# Patient Record
Sex: Female | Born: 1935 | Race: White | Hispanic: No | Smoking: Former smoker
Health system: Southern US, Community
[De-identification: ages and names within clinical notes are randomized; demographics above are authoritative.]

## PROBLEM LIST (undated history)

## (undated) DIAGNOSIS — C801 Malignant (primary) neoplasm, unspecified: Secondary | ICD-10-CM

## (undated) DIAGNOSIS — R7989 Other specified abnormal findings of blood chemistry: Secondary | ICD-10-CM

## (undated) DIAGNOSIS — I10 Essential (primary) hypertension: Secondary | ICD-10-CM

## (undated) DIAGNOSIS — E785 Hyperlipidemia, unspecified: Secondary | ICD-10-CM

## (undated) DIAGNOSIS — M899 Disorder of bone, unspecified: Secondary | ICD-10-CM

## (undated) DIAGNOSIS — M810 Age-related osteoporosis without current pathological fracture: Secondary | ICD-10-CM

## (undated) DIAGNOSIS — L570 Actinic keratosis: Secondary | ICD-10-CM

## (undated) DIAGNOSIS — J9801 Acute bronchospasm: Secondary | ICD-10-CM

## (undated) DIAGNOSIS — H269 Unspecified cataract: Secondary | ICD-10-CM

## (undated) DIAGNOSIS — R0602 Shortness of breath: Secondary | ICD-10-CM

## (undated) DIAGNOSIS — N809 Endometriosis, unspecified: Secondary | ICD-10-CM

## (undated) DIAGNOSIS — R002 Palpitations: Secondary | ICD-10-CM

## (undated) DIAGNOSIS — I491 Atrial premature depolarization: Secondary | ICD-10-CM

## (undated) DIAGNOSIS — N939 Abnormal uterine and vaginal bleeding, unspecified: Secondary | ICD-10-CM

## (undated) DIAGNOSIS — J45909 Unspecified asthma, uncomplicated: Secondary | ICD-10-CM

## (undated) DIAGNOSIS — M1990 Unspecified osteoarthritis, unspecified site: Secondary | ICD-10-CM

## (undated) DIAGNOSIS — M949 Disorder of cartilage, unspecified: Secondary | ICD-10-CM

## (undated) DIAGNOSIS — R609 Edema, unspecified: Secondary | ICD-10-CM

## (undated) DIAGNOSIS — N946 Dysmenorrhea, unspecified: Secondary | ICD-10-CM

## (undated) DIAGNOSIS — R42 Dizziness and giddiness: Secondary | ICD-10-CM

## (undated) DIAGNOSIS — N950 Postmenopausal bleeding: Secondary | ICD-10-CM

## (undated) HISTORY — DX: Endometriosis, unspecified: N80.9

## (undated) HISTORY — DX: Unspecified asthma, uncomplicated: J45.909

## (undated) HISTORY — DX: Dizziness and giddiness: R42

## (undated) HISTORY — DX: Shortness of breath: R06.02

## (undated) HISTORY — DX: Atrial premature depolarization: I49.1

## (undated) HISTORY — DX: Hyperlipidemia, unspecified: E78.5

## (undated) HISTORY — DX: Other specified abnormal findings of blood chemistry: R79.89

## (undated) HISTORY — DX: Age-related osteoporosis without current pathological fracture: M81.0

## (undated) HISTORY — DX: Disorder of bone, unspecified: M89.9

## (undated) HISTORY — DX: Actinic keratosis: L57.0

## (undated) HISTORY — DX: Edema, unspecified: R60.9

## (undated) HISTORY — DX: Disorder of cartilage, unspecified: M94.9

## (undated) HISTORY — DX: Essential (primary) hypertension: I10

## (undated) HISTORY — DX: Unspecified osteoarthritis, unspecified site: M19.90

## (undated) HISTORY — DX: Unspecified cataract: H26.9

## (undated) HISTORY — DX: Palpitations: R00.2

## (undated) HISTORY — DX: Postmenopausal bleeding: N95.0

## (undated) HISTORY — DX: Acute bronchospasm: J98.01

## (undated) HISTORY — DX: Dysmenorrhea, unspecified: N94.6

## (undated) HISTORY — DX: Abnormal uterine and vaginal bleeding, unspecified: N93.9

## (undated) HISTORY — PX: DILATION AND CURETTAGE OF UTERUS: SHX78

## (undated) HISTORY — DX: Malignant (primary) neoplasm, unspecified: C80.1

---
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
## 2000-04-16 ENCOUNTER — Encounter: Payer: Self-pay | Admitting: Family Medicine

## 2000-04-16 ENCOUNTER — Encounter: Admission: RE | Admit: 2000-04-16 | Discharge: 2000-04-16 | Payer: Self-pay | Admitting: Family Medicine

## 2002-08-21 HISTORY — PX: CARPAL TUNNEL RELEASE: SHX101

## 2005-07-26 ENCOUNTER — Other Ambulatory Visit: Admission: RE | Admit: 2005-07-26 | Discharge: 2005-07-26 | Payer: Self-pay | Admitting: Family Medicine

## 2005-08-23 ENCOUNTER — Encounter: Admission: RE | Admit: 2005-08-23 | Discharge: 2005-08-23 | Payer: Self-pay | Admitting: Family Medicine

## 2006-08-20 DIAGNOSIS — I10 Essential (primary) hypertension: Secondary | ICD-10-CM | POA: Insufficient documentation

## 2006-08-20 DIAGNOSIS — M1990 Unspecified osteoarthritis, unspecified site: Secondary | ICD-10-CM

## 2006-08-20 HISTORY — DX: Essential (primary) hypertension: I10

## 2006-08-20 HISTORY — DX: Unspecified osteoarthritis, unspecified site: M19.90

## 2006-08-21 HISTORY — PX: KNEE ARTHROSCOPY: SHX127

## 2007-02-18 DIAGNOSIS — M899 Disorder of bone, unspecified: Secondary | ICD-10-CM

## 2007-02-18 HISTORY — DX: Disorder of bone, unspecified: M89.9

## 2008-04-21 DIAGNOSIS — C801 Malignant (primary) neoplasm, unspecified: Secondary | ICD-10-CM

## 2008-04-21 HISTORY — DX: Malignant (primary) neoplasm, unspecified: C80.1

## 2008-08-21 HISTORY — PX: CATARACT EXTRACTION W/ INTRAOCULAR LENS IMPLANT: SHX1309

## 2008-12-01 ENCOUNTER — Encounter: Admission: RE | Admit: 2008-12-01 | Discharge: 2008-12-01 | Payer: Self-pay | Admitting: Internal Medicine

## 2009-07-21 HISTORY — PX: EYE SURGERY: SHX253

## 2010-08-30 DIAGNOSIS — N950 Postmenopausal bleeding: Secondary | ICD-10-CM

## 2010-08-30 DIAGNOSIS — R42 Dizziness and giddiness: Secondary | ICD-10-CM

## 2010-08-30 HISTORY — DX: Dizziness and giddiness: R42

## 2010-08-30 HISTORY — DX: Postmenopausal bleeding: N95.0

## 2010-10-18 DIAGNOSIS — E785 Hyperlipidemia, unspecified: Secondary | ICD-10-CM

## 2010-10-18 HISTORY — DX: Hyperlipidemia, unspecified: E78.5

## 2010-11-03 ENCOUNTER — Ambulatory Visit (HOSPITAL_BASED_OUTPATIENT_CLINIC_OR_DEPARTMENT_OTHER)
Admission: RE | Admit: 2010-11-03 | Discharge: 2010-11-03 | Disposition: A | Discharge status: Home / Self Care | Payer: MEDICARE | Source: Ambulatory Visit | Attending: Gynecology | Admitting: Gynecology

## 2010-11-03 ENCOUNTER — Ambulatory Visit (HOSPITAL_COMMUNITY)
Admission: RE | Admit: 2010-11-03 | Discharge: 2010-11-03 | Disposition: A | Discharge status: Home / Self Care | Payer: MEDICARE | Source: Ambulatory Visit | Attending: Gynecology | Admitting: Gynecology

## 2010-11-03 ENCOUNTER — Other Ambulatory Visit: Payer: Self-pay | Admitting: Gynecology

## 2010-11-03 DIAGNOSIS — N840 Polyp of corpus uteri: Secondary | ICD-10-CM | POA: Insufficient documentation

## 2010-11-03 DIAGNOSIS — N952 Postmenopausal atrophic vaginitis: Secondary | ICD-10-CM | POA: Insufficient documentation

## 2010-11-03 DIAGNOSIS — Z01810 Encounter for preprocedural cardiovascular examination: Secondary | ICD-10-CM | POA: Insufficient documentation

## 2010-11-03 DIAGNOSIS — Z01812 Encounter for preprocedural laboratory examination: Secondary | ICD-10-CM | POA: Insufficient documentation

## 2010-11-03 DIAGNOSIS — M47814 Spondylosis without myelopathy or radiculopathy, thoracic region: Secondary | ICD-10-CM | POA: Insufficient documentation

## 2010-11-03 DIAGNOSIS — Z01818 Encounter for other preprocedural examination: Secondary | ICD-10-CM | POA: Insufficient documentation

## 2010-11-03 DIAGNOSIS — N950 Postmenopausal bleeding: Secondary | ICD-10-CM | POA: Insufficient documentation

## 2010-11-03 LAB — POCT I-STAT 4, (NA,K, GLUC, HGB,HCT)
Glucose, Bld: 105 mg/dL — ABNORMAL HIGH (ref 70–99)
HCT: 41 % (ref 36.0–46.0)
Hemoglobin: 13.9 g/dL (ref 12.0–15.0)
Potassium: 3.5 mEq/L (ref 3.5–5.1)
Sodium: 138 mEq/L (ref 135–145)

## 2011-02-03 NOTE — Op Note (Signed)
  NAMESHANTA, HARTNER NO.:  [id number]  MEDICAL RECORD NO.:  [id number]           PATIENT TYPE:  O  LOCATION:  XRAY                         FACILITY:  [facility]  PHYSICIAN:  Gretta Cool, M.D. DATE OF BIRTH:  23-Feb-1923  DATE OF PROCEDURE:  11/03/2010 DATE OF DISCHARGE:                              OPERATIVE REPORT   PREOPERATIVE DIAGNOSES: 1. Postmenopausal bleeding with endometrial polyp by ultrasound,     highly suspicious of endometrial cancer. 2. Extreme vaginal and genital atrophy with very difficult access.  POSTOPERATIVE DIAGNOSES: 1. Postmenopausal bleeding with endometrial polyp by ultrasound,     highly suspicious of endometrial cancer. 2. Extreme vaginal and genital atrophy with very difficult access.  PROCEDURES: 1. Paracervical block. 2. Hysteroscopy. 3. Resection of endometrial polyp and irregular endometrial surface.  SURGEON:  Gretta Cool, M.D.  ANESTHESIA:  IV sedation and paracervical block.  BRIEF HISTORY:  This is an 77 year old G3 P3 with sudden onset of postmenopausal bleeding.  Evaluation revealed vaginal atrophy and on ultrasound fluid-filled cavity with a polyp that prominently bulged into the cavity.  We have discussed that in this setting, the risk of malignancy exceed 60%.  I have recommended on to hysteroscopy under IV sedation adequate to allow access to her cervix that cannot be visualized in the office.  She is now admitted for hysteroscopy, resection of the endometrial polyp to rule out endometrial cancer.  DESCRIPTION OF PROCEDURE:  Under excellent anesthesia as above with the patient prepped and draped in Allen stirrups with her bladder drained, a Peterson speculum was placed in the vagina for application of paracervical block and for access to the very elusive posterior lip of the cervix.  With traction from the anterior lip, the posterior lip could finally be accessed and the cervix and uterus straightened.   The cavity was then sounded with os locators and then small [employer] dilators. A series of Pratt dilators were then used to dilate the cervix sufficiently to allow the hysteroscopic resectoscope to be placed.  The resectoscope was then placed and the cavity photographed.  The polyp seen by ultrasound was easily identified and resected.  Irregular endometrial tissue in several areas around the cavity of the uterus were identified and also resected and submitted for pathologic exam.  At the end of the procedure, there remained no significant endometrial abnormality visible.  There were no complications, no perforation.  Estimated fluid deficit was approximately 75 cc.  Complications none.          ______________________________ Gretta Cool, M.D.     CWL/MEDQ  D:  11/03/2010  T:  11/03/2010  Job:  [id number]  cc:   Lenon Curt. Chilton Si, M.D. Fax: [phone]  Gretta Cool, M.D. Fax: [phone]  Electronically Signed by Beather Arbour M.D. on 02/03/2011 11:49:37 AM

## 2011-03-14 DIAGNOSIS — R7989 Other specified abnormal findings of blood chemistry: Secondary | ICD-10-CM

## 2011-03-14 DIAGNOSIS — R739 Hyperglycemia, unspecified: Secondary | ICD-10-CM | POA: Insufficient documentation

## 2011-03-14 HISTORY — DX: Other specified abnormal findings of blood chemistry: R79.89

## 2011-05-23 DIAGNOSIS — I491 Atrial premature depolarization: Secondary | ICD-10-CM

## 2011-05-23 HISTORY — DX: Atrial premature depolarization: I49.1

## 2011-08-02 ENCOUNTER — Other Ambulatory Visit: Payer: Self-pay | Admitting: Dermatology

## 2011-10-31 DIAGNOSIS — J9801 Acute bronchospasm: Secondary | ICD-10-CM

## 2011-10-31 HISTORY — DX: Acute bronchospasm: J98.01

## 2011-11-28 DIAGNOSIS — R0602 Shortness of breath: Secondary | ICD-10-CM

## 2011-11-28 HISTORY — DX: Shortness of breath: R06.02

## 2011-12-19 DIAGNOSIS — R609 Edema, unspecified: Secondary | ICD-10-CM | POA: Insufficient documentation

## 2011-12-19 HISTORY — DX: Edema, unspecified: R60.9

## 2012-02-08 ENCOUNTER — Other Ambulatory Visit: Payer: Self-pay

## 2012-02-13 DIAGNOSIS — L570 Actinic keratosis: Secondary | ICD-10-CM

## 2012-02-13 DIAGNOSIS — R002 Palpitations: Secondary | ICD-10-CM

## 2012-02-13 HISTORY — DX: Palpitations: R00.2

## 2012-02-13 HISTORY — DX: Actinic keratosis: L57.0

## 2012-08-05 ENCOUNTER — Other Ambulatory Visit: Payer: Self-pay

## 2012-09-12 ENCOUNTER — Other Ambulatory Visit: Payer: Self-pay

## 2012-10-19 HISTORY — PX: CATARACT EXTRACTION W/ INTRAOCULAR LENS IMPLANT: SHX1309

## 2013-02-03 ENCOUNTER — Other Ambulatory Visit: Payer: Self-pay | Admitting: Geriatric Medicine

## 2013-02-04 ENCOUNTER — Encounter: Payer: Self-pay | Admitting: *Deleted

## 2013-02-11 ENCOUNTER — Encounter: Payer: Self-pay | Admitting: Internal Medicine

## 2013-02-11 ENCOUNTER — Non-Acute Institutional Stay: Payer: Medicare Other | Admitting: Internal Medicine

## 2013-02-11 VITALS — BP 130/68 | HR 62 | Temp 97.1°F | Ht 62.5 in | Wt 123.0 lb

## 2013-02-11 DIAGNOSIS — R609 Edema, unspecified: Secondary | ICD-10-CM

## 2013-02-11 DIAGNOSIS — M1990 Unspecified osteoarthritis, unspecified site: Secondary | ICD-10-CM

## 2013-02-11 DIAGNOSIS — I10 Essential (primary) hypertension: Secondary | ICD-10-CM

## 2013-02-11 DIAGNOSIS — R7989 Other specified abnormal findings of blood chemistry: Secondary | ICD-10-CM

## 2013-02-11 DIAGNOSIS — M899 Disorder of bone, unspecified: Secondary | ICD-10-CM

## 2013-02-11 DIAGNOSIS — R002 Palpitations: Secondary | ICD-10-CM

## 2013-02-11 DIAGNOSIS — E785 Hyperlipidemia, unspecified: Secondary | ICD-10-CM

## 2013-02-11 DIAGNOSIS — L570 Actinic keratosis: Secondary | ICD-10-CM

## 2013-02-11 NOTE — Progress Notes (Signed)
Passed clock drawing 

## 2013-03-25 ENCOUNTER — Other Ambulatory Visit: Payer: Self-pay

## 2013-03-29 ENCOUNTER — Other Ambulatory Visit: Payer: Self-pay | Admitting: Internal Medicine

## 2013-04-17 ENCOUNTER — Encounter: Payer: Self-pay | Admitting: Internal Medicine

## 2013-04-17 DIAGNOSIS — L570 Actinic keratosis: Secondary | ICD-10-CM | POA: Insufficient documentation

## 2013-04-17 NOTE — Progress Notes (Signed)
Subjective:    Patient ID: Terri Acosta, female    DOB: 03/19/1923, 79 y.o.   MRN: [id number]  HPI Palpitations: Patient reports occasional episodes of palpitations. They do not interfere with her life and are not accompanied by dizziness, chest discomfort, or shortness of breath.  Edema: Trace amount of lower legs.  Other abnormal blood chemistry: History of elevated glucose. No medications. Diet controlled. Last glucose 104.  Other and unspecified hyperlipidemia: Controlled  Unspecified essential hypertension: Controlled  Osteoarthrosis, unspecified whether generalized or localized, unspecified site: Generalized  Disorder of bone and cartilage, unspecified: Osteopenia    Current Outpatient Prescriptions on File Prior to Visit  Medication Sig Dispense Refill  . albuterol (PROVENTIL HFA;VENTOLIN HFA) 108 (90 BASE) MCG/ACT inhaler Inhale 2 puffs into the lungs every 6 (six) hours as needed for wheezing. Take 2 puffs prior to exertion up to four times daily to help breathing.      [name] amLODipine (NORVASC) 5 MG tablet Take 5 mg by mouth daily. Take 1/2 tablet twice daily for blood pressure.      [name] aspirin 81 MG tablet Take 81 mg by mouth daily. Take 1 tablet daily for heart attack and stroke.      . Calcium Carb-Cholecalciferol (CALCIUM + D3) 600-200 MG-UNIT TABS Take 600 mg by mouth. Take 2 tablets daily as Vit. D supplement.      [name] dextromethorphan (DELSYM) 30 MG/5ML liquid Take 60 mg by mouth as needed for cough. Take 1 tsp every 12 hours as needed for cough.      . metoprolol (LOPRESSOR) 50 MG tablet Take 50 mg by mouth 2 (two) times daily. Take a tablet twice daily to control heart rhythm and blood pressure.       No current facility-administered medications on file prior to visit.   Immunization History  Administered Date(s) Administered  . Influenza Whole 08/21/2010, 05/21/2012  . Pneumococcal Conjugate 08/22/1999  . Td 08/22/1999  . Zoster 08/21/2005   Active Ambulatory  Problems    Diagnosis Date Noted  . Palpitations 02/13/2012  . Edema 12/19/2011  . Other abnormal blood chemistry 03/14/2011  . Other and unspecified hyperlipidemia 10/18/2010  . Unspecified essential hypertension 08/20/2006  . Osteoarthrosis, unspecified whether generalized or localized, unspecified site 08/20/2006  . Disorder of bone and cartilage, unspecified 02/18/2007   Resolved Ambulatory Problems    Diagnosis Date Noted  . No Resolved Ambulatory Problems   Past Medical History  Diagnosis Date  . Actinic keratosis 02/13/2012  . Shortness of breath 11/28/2011  . Acute bronchospasm 10/31/2011  . Supraventricular premature beats 05/23/2011  . 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 Dizziness and giddiness 08/30/2010  . Postmenopausal bleeding 08/30/2010  . Cataract   . Cancer 04/2008   Past Surgical History  Procedure Laterality Date  . Cesarean section  [id number]    x3  . Carpal tunnel release  2004    S. Norris MD  . Knee arthroscopy Right 2008    Torn meniscus  . Eye surgery Right 07/2009    cataract extraction/IOLI McCuen, MD  . Cataract extraction w/ intraocular lens implant Right 2010  . Cataract extraction w/ intraocular lens implant Left 10/2012   History   Social History  . Marital Status: Widowed    Spouse Name: N/A    Number of Children: N/A  . Years of Education: N/A   Social History Main Topics  . Smoking status: Former Smoker    Quit date: 02/11/1978  . Smokeless tobacco: Never Used  .  Alcohol Use: 0.6 oz/week    1 Glasses of wine per week     Comment: nightly  . Drug Use: No  . Sexual Activity: No   Other Topics Concern  . None   Social History Narrative   Lives alone in a apartment at [facility] in the independent living area.   The patient is not exercising regularly.   No specific diet.   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   She has a living will.    Family Status  Relation Status Death Age  . Daughter  Alive   . Mother Deceased 29    natural causes  . Father Deceased 38    natural causes  . Sister Alive   . Brother Deceased 38    COPD  . Son Alive   . Sister Alive     History of syringamyelia  . Son Alive    Family History  Problem Relation Age of Onset  . Diabetes Brother   . COPD Brother    CONSULTANTS Orthopedic: Malon Kindle Ophthalmology: Rueben Bash Dermatology: Lovenia Kim Cardiology: Jacinto Halim  PAST PROCEDURES 08/22/2006 bone density: Osteopenia 12/01/08 chest x-ray: Borderline to slight hyperinflation of COPD.  07/27/2009 right knee x-ray: Mild to moderate osteoarthritic change. Some evidence for chondromalacia versus possible degenerative meniscus tear. 08/07/2011 renal artery ultrasound: No evidence for renal artery occlusive disease. Normal kidney size. 1.3 cm anechoic cyst seen in left kidney.  Review of Systems  Constitutional: Negative for fever, diaphoresis, activity change, appetite change, fatigue and unexpected weight change.  HENT: Positive for hearing loss. Negative for ear pain.   Eyes: Positive for visual disturbance.       Diminished visual acuity in the left eye due to cataract.  Respiratory: Negative.   Cardiovascular: Negative for chest pain, palpitations and leg swelling.  Gastrointestinal: Negative.        Mild, intermittent reflux symptoms.  Endocrine: Negative.   Genitourinary: Negative.   Musculoskeletal:       Chronic generalized mild arthritis. She has right knee discomfort. There is no joint swelling. She denies loss of balance. Gait is stable.  Skin:       Complains of dry skin. Actinic keratoses present on right ear and right upper cheek. Sees Dr. Lovenia Kim.  Neurological: Negative.   Hematological: Negative.   Psychiatric/Behavioral: Negative.        Objective:BP 130/68  Pulse 62  Temp(Src) 97.1 F (36.2 C) (Oral)  Ht 5' 2.5" (1.588 m)  Wt 123 lb (55.792 kg)  BMI 22.12 kg/m2    Physical Exam  Constitutional: She is  oriented to person, place, and time. She appears well-developed and well-nourished. No distress.  Thin body habitus.  Eyes:  Cataract left eye. Right lens implant. Left lower eyelid eversion.  Cardiovascular: Normal rate, regular rhythm and intact distal pulses.  Exam reveals no gallop and no friction rub.   Grade 1/6 systolic ejection murmur heard best along the sternal border.  Pulmonary/Chest: Breath sounds normal. No respiratory distress. She has no wheezes. She has no rales. She exhibits no tenderness.  Abdominal: Bowel sounds are normal. She exhibits no distension and no mass. There is no tenderness.  Genitourinary:  Patient refused genital and rectal exams.  Musculoskeletal: She exhibits no edema and no tenderness.  Neurological: She is alert and oriented to person, place, and time. She has normal reflexes. No cranial nerve deficit. Coordination normal.  Intact vibratory sensation. 02/11/2013 MMSE 28/30. Passed clock drawing.  Skin: No rash  noted. No erythema. No pallor.  Actinic keratosis right ear and right upper cheek.  Psychiatric: She has a normal mood and affect. Her behavior is normal. Judgment and thought content normal.     LAB REVIEW 02/03/2013 CMP: Glucose 104, otherwise normal  Lipids: TC 203, trig 67, HDL 72, LDL 118     Assessment & Plan:  Palpitations: No active intervention felt necessary at this time.  Edema: Stable  Other abnormal blood chemistry: Previous elevations of glucose. Currently normal  Other and unspecified hyperlipidemia: His never been treated by medication. She has relatively high HDL which offset some of the problems of her slightly elevated LDL.  Unspecified essential hypertension: Controlled  Osteoarthrosis, unspecified whether generalized or localized, unspecified site: Chronic condition which is not changing  Disorder of bone and cartilage, unspecified: Osteopenia. No active treatment at this time.  Actinic keratosis of right cheek:  Patient will be seeing Dr. Lovenia Kim, dermatologist.

## 2013-04-17 NOTE — Patient Instructions (Signed)
Continue current medications. 

## 2013-07-01 ENCOUNTER — Other Ambulatory Visit: Payer: Self-pay | Admitting: Internal Medicine

## 2013-08-12 ENCOUNTER — Encounter: Payer: Self-pay | Admitting: Internal Medicine

## 2013-08-12 ENCOUNTER — Non-Acute Institutional Stay: Payer: Medicare Other | Admitting: Internal Medicine

## 2013-08-12 VITALS — BP 122/72 | HR 66 | Temp 97.1°F | Resp 16 | Wt 125.5 lb

## 2013-08-12 DIAGNOSIS — R609 Edema, unspecified: Secondary | ICD-10-CM

## 2013-08-12 DIAGNOSIS — R002 Palpitations: Secondary | ICD-10-CM

## 2013-08-12 DIAGNOSIS — M1990 Unspecified osteoarthritis, unspecified site: Secondary | ICD-10-CM

## 2013-08-12 DIAGNOSIS — I10 Essential (primary) hypertension: Secondary | ICD-10-CM

## 2013-08-12 NOTE — Patient Instructions (Signed)
Continue current medications. 

## 2013-08-12 NOTE — Progress Notes (Signed)
Patient ID: Terri Acosta, female   DOB: 01/26/1923, 79 y.o.   MRN: [id number]    Location:  [facility]   Place of Service: Clinic (12)    Allergies  Allergen Reactions  . Sulfa Antibiotics     Chief Complaint  Patient presents with  . Follow-up    BP, Hyperglycemia, Papitations    HPI:  Some palpitations at night, Does not notice in the day.  Pain in the right elbow. Getting stiff. Left hip pain. Thumb pain.Pain in the right knee. Gets cortisone injections from orthopedist, Dr. Ranell Patrick. Finding it more difficult to  Walk. Still driving. Requests Handicap Permit.    Medications: Patient's Medications  New Prescriptions   No medications on file  Previous Medications   ALBUTEROL (PROVENTIL HFA;VENTOLIN HFA) 108 (90 BASE) MCG/ACT INHALER    Inhale 2 puffs into the lungs every 6 (six) hours as needed for wheezing. Take 2 puffs prior to exertion up to four times daily to help breathing.   AMLODIPINE (NORVASC) 5 MG TABLET    Take 5 mg by mouth daily. Take 1/2 tablet twice daily for blood pressure.   ASPIRIN 81 MG TABLET    Take 81 mg by mouth daily. Take 1 tablet daily for heart attack and stroke.   CALCIUM CARB-CHOLECALCIFEROL (CALCIUM + D3) 600-200 MG-UNIT TABS    Take 600 mg by mouth. Take 2 tablets daily as Vit. D supplement.   DEXTROMETHORPHAN (DELSYM) 30 MG/5ML LIQUID    Take 60 mg by mouth as needed for cough. Take 1 tsp every 12 hours as needed for cough.   LOSARTAN-HYDROCHLOROTHIAZIDE (HYZAAR) 50-12.5 MG PER TABLET    TAKE 1 TABLET DAILY TO CONTROL BLOOD PRESSURE   METOPROLOL (LOPRESSOR) 50 MG TABLET    Take 50 mg by mouth 2 (two) times daily. Take a tablet twice daily to control heart rhythm and blood pressure.  Modified Medications   No medications on file  Discontinued Medications   No medications on file     Review of Systems  Constitutional: Negative for fever, diaphoresis, activity change, appetite change, fatigue and unexpected weight change.  HENT:  Positive for hearing loss. Negative for ear pain.   Eyes: Positive for visual disturbance.       Diminished visual acuity in the left eye due to cataract.  Respiratory: Negative.   Cardiovascular: Negative for chest pain, palpitations and leg swelling.  Gastrointestinal: Negative.        Mild, intermittent reflux symptoms.  Endocrine: Negative.   Genitourinary: Negative.   Musculoskeletal:       Chronic generalized mild arthritis. She has right knee discomfort. There is no joint swelling. She denies loss of balance. Gait is stable.  Skin:       Complains of dry skin. Actinic keratoses present on right ear and right upper cheek. Sees Dr. Lovenia Kim.  Neurological: Negative.   Hematological: Negative.   Psychiatric/Behavioral: Negative.     Filed Vitals:   08/12/13 0909  BP: 122/72  Pulse: 66  Temp: 97.1 F (36.2 C)  TempSrc: Oral  Resp: 16  Weight: 125 lb 8 oz (56.926 kg)   Physical Exam  Constitutional: She is oriented to person, place, and time. She appears well-developed and well-nourished. No distress.  Thin body habitus.  Eyes:  Cataract left eye. Right lens implant. Left lower eyelid eversion.  Cardiovascular: Normal rate, regular rhythm and intact distal pulses.  Exam reveals no gallop and no friction rub.   Grade 1/6 systolic ejection  murmur heard best along the sternal border.  Pulmonary/Chest: Breath sounds normal. No respiratory distress. She has no wheezes. She has no rales. She exhibits no tenderness.  Abdominal: Bowel sounds are normal. She exhibits no distension and no mass. There is no tenderness.  Musculoskeletal: She exhibits tenderness. She exhibits no edema.  Tender in the right knee, wrists, and hands.  Neurological: She is alert and oriented to person, place, and time. She has normal reflexes. No cranial nerve deficit. Coordination normal.  Intact vibratory sensation. 02/11/2013 MMSE 28/30. Passed clock drawing.  Skin: No rash noted. No erythema. No  pallor.  Actinic keratosis right ear and right upper cheek.  Psychiatric: She has a normal mood and affect. Her behavior is normal. Judgment and thought content normal.     Labs reviewed: No visits with results within 3 Month(s) from this visit. Latest known visit with results is:  Hospital Outpatient Visit on 11/03/2010  Component Date Value Range Status  . Sodium 11/03/2010 138  135 - 145 mEq/L Final  . Potassium 11/03/2010 3.5  3.5 - 5.1 mEq/L Final  . Glucose, Bld 11/03/2010 105* 70 - 99 mg/dL Final  . HCT 09/81/1914 41.0  36.0 - 46.0 % Final  . Hemoglobin 11/03/2010 13.9  12.0 - 15.0 g/dL Final      Assessment/Plan  Unspecified essential hypertension: controlled  Osteoarthrosis, unspecified whether generalized or localized, unspecified site: completed Handicap Permit.  Palpitations: unchanged  Edema:resolved

## 2014-01-20 ENCOUNTER — Other Ambulatory Visit: Payer: Self-pay | Admitting: Internal Medicine

## 2014-01-20 MED ORDER — AMLODIPINE BESYLATE 5 MG PO TABS: ORAL_TABLET | ORAL | Status: DC

## 2014-02-02 LAB — BASIC METABOLIC PANEL
BUN: 13 mg/dL (ref 4–21)
Creatinine: 0.6 mg/dL (ref 0.5–1.1)
GLUCOSE: 96 mg/dL
Potassium: 3.7 mmol/L (ref 3.4–5.3)
Sodium: 136 mmol/L — AB (ref 137–147)

## 2014-02-02 LAB — LIPID PANEL
Cholesterol: 197 mg/dL (ref 0–200)
HDL: 71 mg/dL — AB (ref 35–70)
LDL CALC: 110 mg/dL
Triglycerides: 82 mg/dL (ref 40–160)

## 2014-02-02 LAB — HEPATIC FUNCTION PANEL
ALK PHOS: 48 U/L (ref 25–125)
ALT: 8 U/L (ref 7–35)
AST: 14 U/L (ref 13–35)
BILIRUBIN, TOTAL: 0.6 mg/dL

## 2014-02-10 ENCOUNTER — Encounter: Payer: Self-pay | Admitting: Internal Medicine

## 2014-02-17 ENCOUNTER — Non-Acute Institutional Stay: Payer: Medicare Other | Admitting: Internal Medicine

## 2014-02-17 ENCOUNTER — Encounter: Payer: Self-pay | Admitting: Internal Medicine

## 2014-02-17 VITALS — BP 152/78 | HR 60 | Wt 124.0 lb

## 2014-02-17 DIAGNOSIS — R609 Edema, unspecified: Secondary | ICD-10-CM

## 2014-02-17 DIAGNOSIS — E785 Hyperlipidemia, unspecified: Secondary | ICD-10-CM

## 2014-02-17 DIAGNOSIS — I10 Essential (primary) hypertension: Secondary | ICD-10-CM

## 2014-02-17 DIAGNOSIS — R002 Palpitations: Secondary | ICD-10-CM

## 2014-02-17 DIAGNOSIS — R7309 Other abnormal glucose: Secondary | ICD-10-CM

## 2014-02-17 DIAGNOSIS — R739 Hyperglycemia, unspecified: Secondary | ICD-10-CM

## 2014-02-17 NOTE — Progress Notes (Signed)
Patient ID: Terri Acosta, female   DOB: August 05, 1923, 80 y.o.   MRN: [id number]    Location:  [facility]   Place of Service: Clinic (12)    Allergies  Allergen Reactions  . Sulfa Antibiotics     Chief Complaint  Patient presents with  . Medical Management of Chronic Issues    blood pressure, palpitations, cholesterol, hyperglycemia    HPI:  Unspecified essential hypertension: controlled  Hyperglycemia: normal on last test  Palpitations: syill resent. No chest pain.  Edema: improved  Other and unspecified hyperlipidemia: controlled    Medications: Patient's Medications  New Prescriptions   No medications on file  Previous Medications   ALBUTEROL (PROVENTIL HFA;VENTOLIN HFA) 108 (90 BASE) MCG/ACT INHALER    Inhale 2 puffs into the lungs every 6 (six) hours as needed for wheezing. Take 2 puffs prior to exertion up to four times daily to help breathing.   AMLODIPINE (NORVASC) 5 MG TABLET    Take 1/2 tablet twice daily for blood pressure.   ASPIRIN 81 MG TABLET    Take 81 mg by mouth daily. Take 1 tablet daily for heart attack and stroke.   CALCIUM CARB-CHOLECALCIFEROL (CALCIUM + D3) 600-200 MG-UNIT TABS    Take 600 mg by mouth. Take 2 tablets daily as Vit. D supplement.   DEXTROMETHORPHAN (DELSYM) 30 MG/5ML LIQUID    Take 60 mg by mouth as needed for cough. Take 1 tsp every 12 hours as needed for cough.   LOSARTAN-HYDROCHLOROTHIAZIDE (HYZAAR) 50-12.5 MG PER TABLET    TAKE 1 TABLET DAILY TO CONTROL BLOOD PRESSURE   METOPROLOL (LOPRESSOR) 50 MG TABLET    Take 50 mg by mouth 2 (two) times daily. Take a tablet twice daily to control heart rhythm and blood pressure.  Modified Medications   No medications on file  Discontinued Medications   No medications on file     Review of Systems  Constitutional: Negative for fever, diaphoresis, activity change, appetite change, fatigue and unexpected weight change.  HENT: Positive for hearing loss. Negative for ear pain.     Eyes: Positive for visual disturbance.       Diminished visual acuity in the left eye due to cataract.  Respiratory: Negative.   Cardiovascular: Negative for chest pain, palpitations and leg swelling.  Gastrointestinal: Negative.        Mild, intermittent reflux symptoms.  Endocrine: Negative.   Genitourinary: Negative.   Musculoskeletal:       Chronic generalized mild arthritis. She has right knee discomfort. There is no joint swelling. She denies loss of balance. Gait is stable.  Skin:       Complains of dry skin. Actinic keratoses present on right ear and right upper cheek. Sees Dr. Derrel Nip.  Neurological: Negative.   Hematological: Negative.   Psychiatric/Behavioral: Negative.     Filed Vitals:   02/17/14 1110  BP: 152/78  Pulse: 60  Weight: 124 lb (56.246 kg)   Body mass index is 22.3 kg/(m^2).  Physical Exam  Constitutional: She is oriented to person, place, and time. She appears well-developed and well-nourished. No distress.  Thin body habitus.  Eyes:  Cataract left eye. Right lens implant. Left lower eyelid eversion.  Cardiovascular: Normal rate, regular rhythm and intact distal pulses.  Exam reveals no gallop and no friction rub.   Grade 1/6 systolic ejection murmur heard best along the sternal border.  Pulmonary/Chest: Breath sounds normal. No respiratory distress. She has no wheezes. She has no rales. She exhibits no  tenderness.  Abdominal: Bowel sounds are normal. She exhibits no distension and no mass. There is no tenderness.  Musculoskeletal: She exhibits tenderness. She exhibits no edema.  Tender in the right knee, wrists, and hands.  Neurological: She is alert and oriented to person, place, and time. She has normal reflexes. No cranial nerve deficit. Coordination normal.  Intact vibratory sensation. 02/11/2013 MMSE 28/30. Passed clock drawing.  Skin: No rash noted. No erythema. No pallor.  Actinic keratosis right ear and right upper cheek.  Psychiatric:  She has a normal mood and affect. Her behavior is normal. Judgment and thought content normal.     Labs reviewed: [facility] 02[facility]17/2014  Component Date Value Ref Range Status  . Glucose 02/02/2014 96   Final  . BUN 02/02/2014 13  4 - 21 mg/dL Final  . Creatinine 02/02/2014 0.6  0.5 - 1.1 mg/dL Final  . Potassium 02/02/2014 3.7  3.4 - 5.3 mmol/L Final  . Sodium 02/02/2014 136* 137 - 147 mmol/L Final  . Triglycerides 02/02/2014 82  40 - 160 mg/dL Final  . Cholesterol 02/02/2014 197  0 - 200 mg/dL Final  . HDL 02/02/2014 71* 35 - 70 mg/dL Final  . LDL Cholesterol 02/02/2014 110   Final  . Alkaline Phosphatase 02/02/2014 48  25 - 125 U/L Final  . ALT 02/02/2014 8  7 - 35 U/L Final  . AST 02/02/2014 14  13 - 35 U/L Final  . Bilirubin, Total 02/02/2014 0.6   Final      Assessment/Plan  1. Unspecified essential hypertension controlled  2. Hyperglycemia Normal fasting glucose  3. Palpitations unchanged  4. Edema improved  5. Other and unspecified hyperlipidemia stable

## 2014-02-18 ENCOUNTER — Encounter: Payer: Self-pay | Admitting: Internal Medicine

## 2014-03-01 ENCOUNTER — Other Ambulatory Visit: Payer: Self-pay | Admitting: Internal Medicine

## 2014-07-27 LAB — BASIC METABOLIC PANEL
BUN: 16 mg/dL (ref 4–21)
CREATININE: 0.6 mg/dL (ref 0.5–1.1)
GLUCOSE: 94 mg/dL
POTASSIUM: 3.9 mmol/L (ref 3.4–5.3)
SODIUM: 134 mmol/L — AB (ref 137–147)

## 2014-07-27 LAB — LIPID PANEL
Cholesterol: 194 mg/dL (ref 0–200)
HDL: 72 mg/dL — AB (ref 35–70)
LDL Cholesterol: 108 mg/dL
TRIGLYCERIDES: 71 mg/dL (ref 40–160)

## 2014-07-27 LAB — HEPATIC FUNCTION PANEL
ALK PHOS: 50 U/L (ref 25–125)
ALT: 10 U/L (ref 7–35)
AST: 15 U/L (ref 13–35)
BILIRUBIN, TOTAL: 0.6 mg/dL

## 2014-07-30 ENCOUNTER — Encounter: Payer: Self-pay | Admitting: Internal Medicine

## 2014-08-03 ENCOUNTER — Other Ambulatory Visit: Payer: Self-pay

## 2014-08-04 ENCOUNTER — Non-Acute Institutional Stay: Payer: Medicare Other | Admitting: Internal Medicine

## 2014-08-04 ENCOUNTER — Encounter: Payer: Self-pay | Admitting: Internal Medicine

## 2014-08-04 VITALS — BP 142/76 | HR 60 | Wt 123.0 lb

## 2014-08-04 DIAGNOSIS — E785 Hyperlipidemia, unspecified: Secondary | ICD-10-CM

## 2014-08-04 DIAGNOSIS — J449 Chronic obstructive pulmonary disease, unspecified: Secondary | ICD-10-CM

## 2014-08-04 DIAGNOSIS — R739 Hyperglycemia, unspecified: Secondary | ICD-10-CM

## 2014-08-04 DIAGNOSIS — I10 Essential (primary) hypertension: Secondary | ICD-10-CM

## 2014-08-04 DIAGNOSIS — R002 Palpitations: Secondary | ICD-10-CM

## 2014-08-04 DIAGNOSIS — R609 Edema, unspecified: Secondary | ICD-10-CM

## 2014-08-04 NOTE — Progress Notes (Signed)
Patient ID: IRIANA Acosta, female   DOB: 07-27-1923, 80 y.o.   MRN: [id number]    [facility]     Place of Service: Clinic (12)    Allergies  Allergen Reactions  . Sulfa Antibiotics     Chief Complaint  Patient presents with  . Medical Management of Chronic Issues    blood pressure hyperglycemia, cholesterol, palpitations    HPI:  Essential hypertension: Controlled  Palpitations: Rare rapid beats  Hyperglycemia: Controlled  Hyperlipidemia: Controlled  Edema: Improved  Chronic obstructive pulmonary disease, unspecified COPD, unspecified chronic bronchitis type: Occasional dyspnea on exertion. Denies wheezing. sputum production.    Medications: Patient's Medications  New Prescriptions   No medications on file  Previous Medications   ALBUTEROL (PROVENTIL HFA;VENTOLIN HFA) 108 (90 BASE) MCG/ACT INHALER    Inhale 2 puffs into the lungs every 6 (six) hours as needed for wheezing. Take 2 puffs prior to exertion up to four times daily to help breathing.   AMLODIPINE (NORVASC) 5 MG TABLET    Take 1/2 tablet twice daily for blood pressure.   ASPIRIN 81 MG TABLET    Take 81 mg by mouth daily. Take 1 tablet daily for heart attack and stroke.   CALCIUM CARB-CHOLECALCIFEROL (CALCIUM + D3) 600-200 MG-UNIT TABS    Take 600 mg by mouth. Take 2 tablets daily as Vit. D supplement.   DEXTROMETHORPHAN (DELSYM) 30 MG/5ML LIQUID    Take 60 mg by mouth as needed for cough. Take 1 tsp every 12 hours as needed for cough.   LOSARTAN-HYDROCHLOROTHIAZIDE (HYZAAR) 50-12.5 MG PER TABLET    TAKE 1 TABLET DAILY TO CONTROL BLOOD PRESSURE   METOPROLOL (LOPRESSOR) 50 MG TABLET    Take 50 mg by mouth 2 (two) times daily. Take a tablet twice daily to control heart rhythm and blood pressure.  Modified Medications   No medications on file  Discontinued Medications   No medications on file     Review of Systems  Constitutional: Negative for fever, diaphoresis, activity change, appetite  change, fatigue and unexpected weight change.  HENT: Positive for hearing loss. Negative for ear pain.   Eyes: Positive for visual disturbance.       Diminished visual acuity in the left eye due to cataract.  Respiratory: Positive for shortness of breath (on exertion).   Cardiovascular: Negative for chest pain, palpitations and leg swelling.  Gastrointestinal: Negative.        Mild, intermittent reflux symptoms.  Endocrine: Negative.   Genitourinary: Negative.   Musculoskeletal:       Chronic generalized mild arthritis. She has right knee discomfort. There is no joint swelling. She denies loss of balance. Gait is stable.  Skin:       Complains of dry skin. Actinic keratoses present on right ear and right upper cheek. Sees Dr. Derrel Nip.  Neurological: Negative.   Hematological: Negative.   Psychiatric/Behavioral: Negative.     Filed Vitals:   08/04/14 0953  BP: 142/76  Pulse: 60  Weight: 123 lb (55.792 kg)  SpO2: 97%   Body mass index is 22.12 kg/(m^2).  Physical Exam  Constitutional: She is oriented to person, place, and time. She appears well-developed and well-nourished. No distress.  Thin body habitus.  Eyes:  Cataract left eye. Right lens implant. Left lower eyelid eversion.  Cardiovascular: Normal rate, regular rhythm and intact distal pulses.  Exam reveals no gallop and no friction rub.   Grade 1/6 systolic ejection murmur heard best along the sternal border.  Pulmonary/Chest: Breath sounds normal. No respiratory distress. She has no wheezes. She has no rales. She exhibits no tenderness.  Abdominal: Bowel sounds are normal. She exhibits no distension and no mass. There is no tenderness.  Musculoskeletal: She exhibits tenderness. She exhibits no edema.  Tender in the right knee, wrists, and hands.  Neurological: She is alert and oriented to person, place, and time. She has normal reflexes. No cranial nerve deficit. Coordination normal.  Intact vibratory  sensation. 02/11/2013 MMSE 28/30. Passed clock drawing.  Skin: No rash noted. No erythema. No pallor.  Actinic keratosis right ear and right upper cheek.  Psychiatric: She has a normal mood and affect. Her behavior is normal. Judgment and thought content normal.     Labs reviewed: Lab on 08/03/2014  Component Date Value Ref Range Status  . Glucose 07/27/2014 94   Final  . BUN 07/27/2014 16  4 - 21 mg/dL Final  . Creatinine 07/27/2014 0.6  0.5 - 1.1 mg/dL Final  . Potassium 07/27/2014 3.9  3.4 - 5.3 mmol/L Final  . Sodium 07/27/2014 134* 137 - 147 mmol/L Final  . Triglycerides 07/27/2014 71  40 - 160 mg/dL Final  . Cholesterol 07/27/2014 194  0 - 200 mg/dL Final  . HDL 07/27/2014 72* 35 - 70 mg/dL Final  . LDL Cholesterol 07/27/2014 108   Final  . Alkaline Phosphatase 07/27/2014 50  25 - 125 U/L Final  . ALT 07/27/2014 10  7 - 35 U/L Final  . AST 07/27/2014 15  13 - 35 U/L Final  . Bilirubin, Total 07/27/2014 0.6   Final     Assessment/Plan 1. Essential hypertension Controlled  2. Palpitations Rare  3. Hyperglycemia Controlled  4. Hyperlipidemia Controlled  5. Edema Improved  6. Chronic obstructive pulmonary disease, unspecified COPD, unspecified chronic bronchitis type Stable

## 2014-08-07 ENCOUNTER — Other Ambulatory Visit: Payer: Self-pay | Admitting: Internal Medicine

## 2014-08-18 ENCOUNTER — Encounter: Payer: Self-pay | Admitting: Internal Medicine

## 2014-09-15 ENCOUNTER — Encounter: Payer: Self-pay | Admitting: Internal Medicine

## 2014-11-06 ENCOUNTER — Other Ambulatory Visit: Payer: Self-pay | Admitting: Internal Medicine

## 2014-12-10 ENCOUNTER — Encounter: Payer: Self-pay | Admitting: Internal Medicine

## 2014-12-16 ENCOUNTER — Other Ambulatory Visit: Payer: Self-pay | Admitting: Internal Medicine

## 2015-03-06 ENCOUNTER — Other Ambulatory Visit: Payer: Self-pay | Admitting: Internal Medicine

## 2015-03-09 ENCOUNTER — Non-Acute Institutional Stay: Payer: Medicare Other | Admitting: Internal Medicine

## 2015-03-09 ENCOUNTER — Encounter: Payer: Self-pay | Admitting: Internal Medicine

## 2015-03-09 VITALS — BP 162/78 | HR 56 | Temp 97.9°F | Ht 61.5 in | Wt 123.0 lb

## 2015-03-09 DIAGNOSIS — R002 Palpitations: Secondary | ICD-10-CM | POA: Diagnosis not present

## 2015-03-09 DIAGNOSIS — E785 Hyperlipidemia, unspecified: Secondary | ICD-10-CM | POA: Diagnosis not present

## 2015-03-09 DIAGNOSIS — M1612 Unilateral primary osteoarthritis, left hip: Secondary | ICD-10-CM | POA: Diagnosis not present

## 2015-03-09 DIAGNOSIS — I10 Essential (primary) hypertension: Secondary | ICD-10-CM

## 2015-03-09 DIAGNOSIS — R609 Edema, unspecified: Secondary | ICD-10-CM

## 2015-03-09 DIAGNOSIS — J449 Chronic obstructive pulmonary disease, unspecified: Secondary | ICD-10-CM

## 2015-03-09 DIAGNOSIS — R739 Hyperglycemia, unspecified: Secondary | ICD-10-CM

## 2015-03-09 MED ORDER — LOSARTAN POTASSIUM-HCTZ 100-25 MG PO TABS: ORAL_TABLET | ORAL | Status: DC

## 2015-03-09 NOTE — Addendum Note (Signed)
Addended by: Estill Dooms on: 03/09/2015 05:47 PM   Modules accepted: Level of Service

## 2015-03-09 NOTE — Progress Notes (Signed)
Patient ID: Terri Acosta, female   DOB: [birth year], 81 y.o.   MRN: [id number]    HISTORY AND PHYSICAL  Location:  [place] of Service: Clinic (12)   Extended Emergency Contact Information Primary Emergency Contact: Wilhemena Durie [place] of [place] Phone: [phone] Mobile Phone: [phone] Relation: Daughter  Advanced Directive information Does patient have an advance directive?: Yes, Type of Advance Directive: Healthcare Power of [place];Living will  Chief Complaint  Patient presents with  . Annual Exam    Comprehensive exam blood pressure, cholesterol, hyperglycemia  . Edema    in ankles for couple of months, daily  . Palpitations    wakes up at night, occasionaly durning the day    HPI:  Essential hypertension: Systolic blood pressure running a little high today.  Hyperglycemia: No recent check.  Hyperlipidemia: No recent check.  Palpitations: rapid. Last a minute or so. Almost always in the night. No chest pain or dyspnea.  Edema: worse in the left foot. Patient is on amlodipine.  Chronic obstructive pulmonary disease, unspecified COPD, unspecified chronic bronchitis type: Denies dyspnea with walking.  Primary osteoarthritis of left hip; several months pain. Makes her feel off balance. Using ibuprofen twice daily.  Right knee pain: history of arthroscopy.    Past Medical History  Diagnosis Date  . Palpitations 02/13/2012  . Edema 12/19/2011  . Actinic keratosis 02/13/2012  . Shortness of breath 11/28/2011  . Acute bronchospasm 10/31/2011  . Supraventricular premature beats 05/23/2011  . Other abnormal blood chemistry 03/14/2011  . Other and unspecified hyperlipidemia 10/18/2010  . Unspecified essential hypertension 08/20/2006  . 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 Osteoarthrosis, unspecified whether generalized or localized, unspecified site 08/20/2006  . Dizziness and giddiness 08/30/2010  . Postmenopausal  bleeding 08/30/2010  . Disorder of bone and cartilage, unspecified 02/18/2007  . Cataract   . Cancer 04/2008    Skin cancer of low back [name], MD    Past Surgical History  Procedure Laterality Date  . Cesarean section  [phone]    x3  . Carpal tunnel release  2004    S. Norris MD  . Knee arthroscopy Right 2008    Torn meniscus, Dr. Veverly Fells  . Eye surgery Right 07/2009    cataract extraction/IOLI McCuen, MD  . Cataract extraction w/ intraocular lens implant Right 2010  . Cataract extraction w/ intraocular lens implant Left 10/2012    Patient Care Team: Estill Dooms, MD as PCP - General (Internal Medicine) [facility] Netta Cedars, MD as Consulting Physician (Orthopedic Surgery) Luberta Mutter, MD as Consulting Physician (Ophthalmology) Sydnee Levans, MD as Consulting Physician (Dermatology) Adrian Prows, MD as Consulting Physician (Cardiology)  History   Social History  . Marital Status: Widowed    Spouse Name: N/A  . Number of Children: N/A  . Years of Education: N/A   Occupational History  . Not on file.   Social History Main Topics  . Smoking status: Former Smoker    Quit date: 02/11/1978  . Smokeless tobacco: Never Used  . Alcohol Use: 0.6 oz/week    1 Glasses of wine per week     Comment: nightly  . Drug Use: No  . Sexual Activity: No   Other Topics Concern  . Not on file   Social History Narrative   Lives alone in a apartment at [facility] in the independent living area since 2007   The patient is not exercising regularly, walking   No  specific diet.   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   She has a living will, POA   Former smoker, stopped 1979     reports that she quit smoking about 37 years ago. She has never used smokeless tobacco. She reports that she drinks about 0.6 oz of alcohol per week. She reports that she does not use illicit drugs.  Family History  Problem Relation Age of Onset  . Diabetes Brother   .  COPD Brother    Family Status  Relation Status Death Age  . Daughter Alive   . Mother Deceased 58    natural causes  . Father Deceased 12    natural causes  . Sister Alive   . Brother Deceased 56    COPD  . Son Alive   . Sister Alive     History of syringamyelia  . Son Alive     Immunization History  Administered Date(s) Administered  . Influenza Whole 08/21/2010, 05/21/2012  . Influenza-Unspecified 05/21/2013, 06/04/2014  . Pneumococcal Conjugate-13 08/22/1999  . Td 08/22/1999  . Zoster 08/21/2005    Allergies  Allergen Reactions  . Sulfa Antibiotics     Medications: Patient's Medications  New Prescriptions   No medications on file  Previous Medications   ALBUTEROL (PROVENTIL HFA;VENTOLIN HFA) 108 (90 BASE) MCG/ACT INHALER    Inhale 2 puffs into the lungs every 6 (six) hours as needed for wheezing. Take 2 puffs prior to exertion up to four times daily to help breathing.   AMLODIPINE (NORVASC) 5 MG TABLET    TAKE ONE-HALF (1/2) TABLET TWICE A DAY TO CONTROL BLOOD PRESSURE   ASPIRIN 81 MG TABLET    Take 81 mg by mouth daily. Take 1 tablet daily for heart attack and stroke.   CALCIUM CARB-CHOLECALCIFEROL (CALCIUM + D3) 600-200 MG-UNIT TABS    Take 600 mg by mouth. Take 2 tablets daily as Vit. D supplement.   DEXTROMETHORPHAN (DELSYM) 30 MG/5ML LIQUID    Take 60 mg by mouth as needed for cough. Take 1 tsp every 12 hours as needed for cough.   LOSARTAN-HYDROCHLOROTHIAZIDE (HYZAAR) 50-12.5 MG PER TABLET    TAKE 1 TABLET DAILY TO CONTROL BLOOD PRESSURE   METOPROLOL (LOPRESSOR) 50 MG TABLET    TAKE 1 TABLET TWICE DAILY TO HELP HEART RHYTHM AND BLOOD PRESSURE  Modified Medications   No medications on file  Discontinued Medications   No medications on file    Review of Systems  Constitutional: Negative for fever, diaphoresis, activity change, appetite change, fatigue and unexpected weight change.  HENT: Positive for hearing loss. Negative for ear pain.   Eyes: Positive for  visual disturbance.       Diminished visual acuity in the left eye due to cataract.  Respiratory: Positive for shortness of breath (on exertion).   Cardiovascular: Negative for chest pain, palpitations and leg swelling.  Gastrointestinal: Negative.        Mild, intermittent reflux symptoms.  Endocrine: Negative.   Genitourinary: Negative.   Musculoskeletal: Positive for back pain, arthralgias and gait problem.       Chronic generalized mild arthritis. She has right knee discomfort. There is no joint swelling.Using a walker.  Skin:       Complains of dry skin. Actinic keratoses present on right ear and right upper cheek. Sees Dr. Derrel Nip.  Neurological: Negative.   Hematological: Negative.   Psychiatric/Behavioral: Negative.     Filed Vitals:   03/09/15 1025  BP: 162/78  Pulse: 56  Temp: 97.9 F (36.6 C)  TempSrc: Oral  Height: 5' 1.5" (1.562 m)  Weight: 123 lb (55.792 kg)  SpO2: 96%   Body mass index is 22.87 kg/(m^2).  Physical Exam  Constitutional: She is oriented to person, place, and time. She appears well-developed and well-nourished. No distress.  Thin body habitus.  Eyes:  Cataract left eye. Right lens implant. Left lower eyelid eversion.  Cardiovascular: Normal rate, regular rhythm and intact distal pulses.  Exam reveals no gallop and no friction rub.   Grade 1/6 systolic ejection murmur heard best along the sternal border.  Pulmonary/Chest: Breath sounds normal. No respiratory distress. She has no wheezes. She has no rales. She exhibits no tenderness.  Abdominal: Bowel sounds are normal. She exhibits no distension and no mass. There is no tenderness.  Musculoskeletal: She exhibits tenderness. She exhibits no edema.  Tender in the right knee, left hip, wrists, and hands. Scoliosis.  Neurological: She is alert and oriented to person, place, and time. She has normal reflexes. No cranial nerve deficit. Coordination normal.  Intact vibratory sensation. 02/11/2013  MMSE 28/30. Passed clock drawing. 6-CIT 2/28  Skin: No rash noted. No erythema. No pallor.  Actinic keratosis right ear and right upper cheek. Multiple SK on neck and trunk.  Psychiatric: She has a normal mood and affect. Her behavior is normal. Judgment and thought content normal.     Labs reviewed: No visits with results within 3 Month(s) from this visit. Latest known visit with results is:  Lab on 08/03/2014  Component Date Value Ref Range Status  . Glucose 07/27/2014 94   Final  . BUN 07/27/2014 16  4 - 21 mg/dL Final  . Creatinine 07/27/2014 0.6  0.5 - 1.1 mg/dL Final  . Potassium 07/27/2014 3.9  3.4 - 5.3 mmol/L Final  . Sodium 07/27/2014 134* 137 - 147 mmol/L Final  . Triglycerides 07/27/2014 71  40 - 160 mg/dL Final  . Cholesterol 07/27/2014 194  0 - 200 mg/dL Final  . HDL 07/27/2014 72* 35 - 70 mg/dL Final  . LDL Cholesterol 07/27/2014 108   Final  . Alkaline Phosphatase 07/27/2014 50  25 - 125 U/L Final  . ALT 07/27/2014 10  7 - 35 U/L Final  . AST 07/27/2014 15  13 - 35 U/L Final  . Bilirubin, Total 07/27/2014 0.6   Final    No results found.   Assessment/Plan  1. Essential hypertension Discontinue amlodipine - losartan-hydrochlorothiazide (HYZAAR) 100-25 MG per tablet; One daily to control BP  Dispense: 90 tablet; Refill: 3  2. Hyperglycemia Follow-up in future  3. Hyperlipidemia Follow-up in future  4. Palpitations Self-limited duration of less than a minute  5. Edema Possibly induced by amlodipine. Discontinue amlodipine.  6. Chronic obstructive pulmonary disease, unspecified COPD, unspecified chronic bronchitis type Stable. No wheezing.  7. Primary osteoarthritis of left hip Try Aleve twice daily in place of the ibuprofen. If this doesn't work, consider Mobic 15 mg daily.

## 2015-07-06 ENCOUNTER — Non-Acute Institutional Stay: Payer: Medicare Other | Admitting: Internal Medicine

## 2015-07-06 ENCOUNTER — Encounter: Payer: Self-pay | Admitting: Internal Medicine

## 2015-07-06 VITALS — BP 122/80 | HR 56 | Temp 97.5°F | Resp 18 | Wt 121.6 lb

## 2015-07-06 DIAGNOSIS — R609 Edema, unspecified: Secondary | ICD-10-CM | POA: Diagnosis not present

## 2015-07-06 DIAGNOSIS — R739 Hyperglycemia, unspecified: Secondary | ICD-10-CM

## 2015-07-06 DIAGNOSIS — R002 Palpitations: Secondary | ICD-10-CM | POA: Diagnosis not present

## 2015-07-06 DIAGNOSIS — E785 Hyperlipidemia, unspecified: Secondary | ICD-10-CM | POA: Diagnosis not present

## 2015-07-06 DIAGNOSIS — I10 Essential (primary) hypertension: Secondary | ICD-10-CM

## 2015-07-06 NOTE — Progress Notes (Signed)
Patient ID: Terri Acosta, female   DOB: [birth year], 81 y.o.   MRN: [id number]    [facility]     Place of Service: Clinic (12)     Allergies  Allergen Reactions  . Sulfa Antibiotics     Chief Complaint  Patient presents with  . Medical Management of Chronic Issues    4 mo F/u    HPI:  Essential hypertension - controlled  Hyperlipidemia - routine follow-up  Edema, unspecified type - resolved after amlodipine was stopped  Palpitations - early morning. Runs of fast beats. No shortness of breath or chest discomfort associated.  Hyperglycemia - needs routine lab follow-up    Medications: Patient's Medications  New Prescriptions   No medications on file  Previous Medications   ALBUTEROL (PROVENTIL HFA;VENTOLIN HFA) 108 (90 BASE) MCG/ACT INHALER    Inhale 2 puffs into the lungs every 6 (six) hours as needed for wheezing. Take 2 puffs prior to exertion up to four times daily to help breathing.   ASPIRIN 81 MG TABLET    Take 81 mg by mouth daily. Take 1 tablet daily for heart attack and stroke.   CALCIUM CARB-CHOLECALCIFEROL (CALCIUM + D3) 600-200 MG-UNIT TABS    Take 600 mg by mouth. Take 2 tablets daily as Vit. D supplement.   DEXTROMETHORPHAN (DELSYM) 30 MG/5ML LIQUID    Take 60 mg by mouth as needed for cough. Take 1 tsp every 12 hours as needed for cough.   LOSARTAN-HYDROCHLOROTHIAZIDE (HYZAAR) 100-25 MG PER TABLET    One daily to control BP   METOPROLOL (LOPRESSOR) 50 MG TABLET    TAKE 1 TABLET TWICE DAILY TO HELP HEART RHYTHM AND BLOOD PRESSURE  Modified Medications   No medications on file  Discontinued Medications   No medications on file     Review of Systems  Constitutional: Negative for fever, diaphoresis, activity change, appetite change, fatigue and unexpected weight change.  HENT: Positive for hearing loss. Negative for ear pain.   Eyes: Positive for visual disturbance.       Diminished visual acuity in the left eye due to cataract.    Respiratory: Positive for shortness of breath (on exertion).   Cardiovascular: Negative for chest pain, palpitations and leg swelling.  Gastrointestinal: Negative.        Mild, intermittent reflux symptoms.  Endocrine:       History of hyperglycemia  Genitourinary: Negative.   Musculoskeletal: Positive for back pain, arthralgias and gait problem.       Chronic generalized mild arthritis. She has right knee discomfort. There is no joint swelling.Using a walker.  Skin:       Complains of dry skin. Actinic keratoses present on right ear and right upper cheek. Sees Dr. Derrel Nip.  Neurological: Negative.   Hematological: Negative.   Psychiatric/Behavioral: Negative.     Filed Vitals:   07/06/15 1000  BP: 122/80  Pulse: 56  Temp: 97.5 F (36.4 C)  TempSrc: Oral  Resp: 18  Weight: 121 lb 9.6 oz (55.157 kg)  SpO2: 95%   Body mass index is 22.61 kg/(m^2).  Physical Exam  Constitutional: She is oriented to person, place, and time. She appears well-developed and well-nourished. No distress.  Thin body habitus.  Eyes:  Cataract left eye. Right lens implant. Left lower eyelid eversion.  Cardiovascular: Normal rate, regular rhythm and intact distal pulses.  Exam reveals no gallop and no friction rub.   Grade 1/6 systolic ejection murmur heard best along the sternal border.  Pulmonary/Chest: Breath sounds normal. No respiratory distress. She has no wheezes. She has no rales. She exhibits no tenderness.  Abdominal: Bowel sounds are normal. She exhibits no distension and no mass. There is no tenderness.  Musculoskeletal: She exhibits tenderness. She exhibits no edema.  Tender in the right knee, left hip, wrists, and hands. Scoliosis.  Neurological: She is alert and oriented to person, place, and time. She has normal reflexes. No cranial nerve deficit. Coordination normal.  Intact vibratory sensation. 02/11/2013 MMSE 28/30. Passed clock drawing. 6-CIT 2/28  Skin: No rash noted. No  erythema. No pallor.  Actinic keratosis right ear and right upper cheek. Multiple SK on neck and trunk.  Psychiatric: She has a normal mood and affect. Her behavior is normal. Judgment and thought content normal.     Labs reviewed: Lab Summary Latest Ref Rng 07/27/2014 02/02/2014 11/03/2010  Hemoglobin 12.0 - 15.0 g/dL (None) (None) 13.9  Hematocrit 36.0 - 46.0 % (None) (None) 41.0  White count - (None) (None) (None)  Platelet count - (None) (None) (None)  Sodium 137 - 147 mmol/L 134(A) 136(A) 138  Potassium 3.4 - 5.3 mmol/L 3.9 3.7 3.5  Calcium - (None) (None) (None)  Phosphorus - (None) (None) (None)  Creatinine 0.5 - 1.1 mg/dL 0.6 0.6 (None)  AST 13 - 35 U/L 15 14 (None)  Alk Phos 25 - 125 U/L 50 48 (None)  Bilirubin - (None) (None) (None)  Glucose - 94 96 105(H)  Cholesterol 0 - 200 mg/dL 194 197 (None)  HDL cholesterol 35 - 70 mg/dL 72(A) 71(A) (None)  Triglycerides 40 - 160 mg/dL 71 82 (None)  LDL Direct - (None) (None) (None)  LDL Calc - 108 110 (None)  Total protein - (None) (None) (None)  Albumin - (None) (None) (None)   No results found for: TSH, T3TOTAL, T4TOTAL, THYROIDAB Lab Results  Component Value Date   BUN 16 07/27/2014   No results found for: HGBA1C     Assessment/Plan  1. Essential hypertension -CMP, future  2. Hyperlipidemia -Lipid panel, future  3. Edema, unspecified type Resolved with discontinuation of amlodipine  4. Palpitations Mild and unassociated with other symptoms  5. Hyperglycemia -CMP, future

## 2015-12-10 ENCOUNTER — Other Ambulatory Visit: Payer: Self-pay | Admitting: Internal Medicine

## 2015-12-28 ENCOUNTER — Encounter: Payer: Self-pay | Admitting: Internal Medicine

## 2015-12-28 ENCOUNTER — Other Ambulatory Visit: Payer: Self-pay | Admitting: *Deleted

## 2015-12-28 ENCOUNTER — Non-Acute Institutional Stay: Payer: Medicare Other | Admitting: Internal Medicine

## 2015-12-28 VITALS — BP 140/84 | HR 83 | Temp 97.3°F | Ht 61.5 in | Wt 115.0 lb

## 2015-12-28 DIAGNOSIS — R197 Diarrhea, unspecified: Secondary | ICD-10-CM | POA: Diagnosis not present

## 2015-12-28 MED ORDER — DIPHENOXYLATE-ATROPINE 2.5-0.025 MG PO TABS: ORAL_TABLET | ORAL | Status: DC

## 2015-12-28 MED ORDER — SACCHAROMYCES BOULARDII 250 MG PO CAPS: ORAL_CAPSULE | ORAL | Status: DC

## 2015-12-28 NOTE — Progress Notes (Signed)
Patient ID: Terri Acosta, female   DOB: Jul 03, 1923, 82 y.o.   MRN: [id number]    [facility]     Place of Service: Clinic (12)     Allergies  Allergen Reactions  . Sulfa Antibiotics     Chief Complaint  Patient presents with  . Diarrhea    for couple of weeks, daily. Using Immodium 1-2 daily    HPI:  Liquid stools . Somewhat variable. No blood in the stool. Not incontinence. No fever. Appetite is OK. About 2-3 stools daily. Using Imodium after she has loose stool. Has lost 6 # since 07/05/16.  Has never had colonoscopy.  Medications: Patient's Medications  New Prescriptions   No medications on file  Previous Medications   ALBUTEROL (PROVENTIL HFA;VENTOLIN HFA) 108 (90 BASE) MCG/ACT INHALER    Inhale 2 puffs into the lungs every 6 (six) hours as needed for wheezing. Take 2 puffs prior to exertion up to four times daily to help breathing.   ASPIRIN 81 MG TABLET    Take 81 mg by mouth daily. Take 1 tablet daily for heart attack and stroke.   CALCIUM CARB-CHOLECALCIFEROL (CALCIUM + D3) 600-200 MG-UNIT TABS    Take 600 mg by mouth. Take 2 tablets daily as Vit. D supplement.   DEXTROMETHORPHAN (DELSYM) 30 MG/5ML LIQUID    Take 60 mg by mouth as needed for cough. Take 1 tsp every 12 hours as needed for cough.   LOSARTAN-HYDROCHLOROTHIAZIDE (HYZAAR) 100-25 MG PER TABLET    One daily to control BP   METOPROLOL (LOPRESSOR) 50 MG TABLET    TAKE 1 TABLET TWICE A DAY TO HELP HEART RHYTHM AND BLOOD PRESSURE  Modified Medications   No medications on file  Discontinued Medications   No medications on file     Review of Systems  Constitutional: Positive for unexpected weight change (lost from 121# in Nov 2016.). Negative for fever, diaphoresis, activity change, appetite change and fatigue.  HENT: Positive for hearing loss. Negative for ear pain.   Eyes: Positive for visual disturbance.       Diminished visual acuity in the left eye due to cataract.  Respiratory: Positive  for shortness of breath (on exertion).   Cardiovascular: Positive for palpitations. Negative for chest pain and leg swelling.  Gastrointestinal: Positive for diarrhea.       Mild, intermittent reflux symptoms.  Endocrine:       History of hyperglycemia  Genitourinary: Negative.   Musculoskeletal: Positive for back pain, arthralgias and gait problem.       Chronic generalized mild arthritis. She has right knee discomfort. There is no joint swelling.Using a walker.  Skin:       Complains of dry skin. Actinic keratoses present on right ear and right upper cheek. Sees Dr. Derrel Nip.  Neurological: Negative.   Hematological: Negative.   Psychiatric/Behavioral: Negative.     Filed Vitals:   12/28/15 1103  BP: 140/84  Pulse: 83  Temp: 97.3 F (36.3 C)  TempSrc: Oral  Height: 5' 1.5" (1.562 m)  Weight: 115 lb (52.164 kg)  SpO2: 94%   Wt Readings from Last 3 Encounters:  12/28/15 115 lb (52.164 kg)  07/06/15 121 lb 9.6 oz (55.157 kg)  03/09/15 123 lb (55.792 kg)    Body mass index is 21.38 kg/(m^2).  Physical Exam  Constitutional: She is oriented to person, place, and time. She appears well-developed and well-nourished. No distress.  Thin body habitus.  Eyes:  Cataract left eye. Right lens implant.  Left lower eyelid eversion.  Cardiovascular: Normal rate, regular rhythm and intact distal pulses.  Exam reveals no gallop and no friction rub.   Grade 1/6 systolic ejection murmur heard best along the sternal border.  Pulmonary/Chest: Breath sounds normal. No respiratory distress. She has no wheezes. She has no rales. She exhibits no tenderness.  Abdominal: Bowel sounds are normal. She exhibits no distension.  Mildly tender fusiform lump in LLQ.  Musculoskeletal: She exhibits tenderness. She exhibits no edema.  Tender in the right knee, left hip, wrists, and hands. Scoliosis.  Neurological: She is alert and oriented to person, place, and time. She has normal reflexes. No cranial  nerve deficit. Coordination normal.  Intact vibratory sensation. 02/11/2013 MMSE 28/30. Passed clock drawing. 6-CIT 2/28  Skin: No rash noted. No erythema. No pallor.  Actinic keratosis right ear and right upper cheek. Multiple SK on neck and trunk.  Psychiatric: She has a normal mood and affect. Her behavior is normal. Judgment and thought content normal.     Labs reviewed: Lab Summary Latest Ref Rng 07/27/2014 02/02/2014 11/03/2010  Hemoglobin 12.0 - 15.0 g/dL (None) (None) 13.9  Hematocrit 36.0 - 46.0 % (None) (None) 41.0  White count - (None) (None) (None)  Platelet count - (None) (None) (None)  Sodium 137 - 147 mmol/L 134(A) 136(A) 138  Potassium 3.4 - 5.3 mmol/L 3.9 3.7 3.5  Calcium - (None) (None) (None)  Phosphorus - (None) (None) (None)  Creatinine 0.5 - 1.1 mg/dL 0.6 0.6 (None)  AST 13 - 35 U/L 15 14 (None)  Alk Phos 25 - 125 U/L 50 48 (None)  Bilirubin - (None) (None) (None)  Glucose - 94 96 105(H)  Cholesterol 0 - 200 mg/dL 194 197 (None)  HDL cholesterol 35 - 70 mg/dL 72(A) 71(A) (None)  Triglycerides 40 - 160 mg/dL 71 82 (None)  LDL Direct - (None) (None) (None)  LDL Calc - 108 110 (None)  Total protein - (None) (None) (None)  Albumin - (None) (None) (None)   No results found for: TSH Lab Results  Component Value Date   BUN 16 07/27/2014   BUN 13 02/02/2014   Lab Results  Component Value Date   CREATININE 0.6 07/27/2014   CREATININE 0.6 02/02/2014   No results found for: HGBA1C     Assessment/Plan  1. Diarrhea, unspecified type Etiology uncertain. Possible benign and transient, but there is also a possibility of malignancy. Discussed with patient. She would like to 'treat " without GI referral initially. She will return in 1 week. - CBC, CMP, TSH - saccharomyces boulardii (FLORASTOR) 250 MG capsule; One twice daily to restore good gut bacteria  Dispense: 60 capsule; Refill: 1 - diphenoxylate-atropine (LOMOTIL) 2.5-0.025 MG tablet; Take one each  morning and one after each loose stool up to 6 times in 24 hours.  Dispense: 30 tablet; Refill: 4

## 2015-12-29 NOTE — Addendum Note (Signed)
Addended by: Estill Dooms on: 12/29/2015 05:20 PM   Modules accepted: Level of Service

## 2015-12-30 LAB — BASIC METABOLIC PANEL
BUN: 10 mg/dL (ref 4–21)
Creatinine: 0.4 mg/dL — AB (ref 0.5–1.1)
GLUCOSE: 86 mg/dL
POTASSIUM: 3.2 mmol/L — AB (ref 3.4–5.3)
Sodium: 132 mmol/L — AB (ref 137–147)

## 2015-12-30 LAB — HEPATIC FUNCTION PANEL
ALK PHOS: 42 U/L (ref 25–125)
ALT: 12 U/L (ref 7–35)
AST: 21 U/L (ref 13–35)
Bilirubin, Total: 0.7 mg/dL

## 2015-12-30 LAB — LIPID PANEL
CHOLESTEROL: 203 mg/dL — AB (ref 0–200)
HDL: 80 mg/dL — AB (ref 35–70)
LDL CALC: 105 mg/dL
Triglycerides: 88 mg/dL (ref 40–160)

## 2015-12-30 LAB — TSH: TSH: 1.29 u[IU]/mL (ref 0.41–5.90)

## 2015-12-30 LAB — CBC AND DIFFERENTIAL
HCT: 39 % (ref 36–46)
Hemoglobin: 13.4 g/dL (ref 12.0–16.0)
PLATELETS: 341 10*3/uL (ref 150–399)
WBC: 8.7 10^3/mL

## 2015-12-31 ENCOUNTER — Encounter: Payer: Self-pay | Admitting: *Deleted

## 2016-01-04 ENCOUNTER — Non-Acute Institutional Stay: Payer: Medicare Other | Admitting: Internal Medicine

## 2016-01-04 ENCOUNTER — Encounter: Payer: Self-pay | Admitting: Internal Medicine

## 2016-01-04 VITALS — BP 122/62 | HR 66 | Temp 97.3°F | Ht 62.0 in | Wt 113.0 lb

## 2016-01-04 DIAGNOSIS — R002 Palpitations: Secondary | ICD-10-CM | POA: Diagnosis not present

## 2016-01-04 DIAGNOSIS — R197 Diarrhea, unspecified: Secondary | ICD-10-CM | POA: Diagnosis not present

## 2016-01-04 DIAGNOSIS — R739 Hyperglycemia, unspecified: Secondary | ICD-10-CM | POA: Diagnosis not present

## 2016-01-04 DIAGNOSIS — I10 Essential (primary) hypertension: Secondary | ICD-10-CM | POA: Diagnosis not present

## 2016-01-04 DIAGNOSIS — J449 Chronic obstructive pulmonary disease, unspecified: Secondary | ICD-10-CM | POA: Diagnosis not present

## 2016-01-04 DIAGNOSIS — E785 Hyperlipidemia, unspecified: Secondary | ICD-10-CM | POA: Diagnosis not present

## 2016-01-04 NOTE — Progress Notes (Signed)
Patient ID: Terri Acosta, female   DOB: 09/04/1922, 82 y.o.   MRN: [id number]    [facility]     [facility] (12[facility]     Allergies  Allergen Reactions  . Lomotil [Diphenoxylate] Nausea And Vomiting  . Sulfa Antibiotics     Chief Complaint  Patient presents with  . Medical Management of Chronic Issues    6 month medication management blood pressure, hyperglycemia. Diarrhea better.  Review labs  . 6CIT test    0    HPI:  1. Diarrhea, unspecified type resolved  2. Essential hypertension controlled  3. Hyperglycemia Normal on last lab  4. Hyperlipidemia controlled  5. Palpitations Still present at night. No associated symptoms  6. Chronic obstructive pulmonary disease, unspecified COPD type (HCC) Denies cough, congestion, wheeze, dyspnea   Medications: Patient's Medications  New Prescriptions   No medications on file  Previous Medications   ALBUTEROL (PROVENTIL HFA;VENTOLIN HFA) 108 (90 BASE) MCG/ACT INHALER    Inhale 2 puffs into the lungs every 6 (six) hours as needed for wheezing. Take 2 puffs prior to exertion up to four times daily to help breathing.   ASPIRIN 81 MG TABLET    Take 81 mg by mouth daily. Take 1 tablet daily for heart attack and stroke.   CALCIUM CARB-CHOLECALCIFEROL (CALCIUM + D3) 600-200 MG-UNIT TABS    Take 600 mg by mouth. Take 2 tablets daily as Vit. D supplement.   DEXTROMETHORPHAN (DELSYM) 30 MG/5ML LIQUID    Take 60 mg by mouth as needed for cough. Take 1 tsp every 12 hours as needed for cough.   DIPHENOXYLATE-ATROPINE (LOMOTIL) 2.5-0.025 MG TABLET    TAKE 1 TABLET BY MOUTH EVERY MORNING AND 1 AFTER EACH LOOSE STOOL UP TO 6 TIMES IN 24 HOURS   LOSARTAN-HYDROCHLOROTHIAZIDE (HYZAAR) 100-25 MG PER TABLET    One daily to control BP   METOPROLOL (LOPRESSOR) 50 MG TABLET    TAKE 1 TABLET TWICE A DAY TO HELP HEART RHYTHM AND BLOOD PRESSURE  Modified Medications   No medications on file  Discontinued Medications   DIPHENOXYLATE-ATROPINE (LOMOTIL) 2.5-0.025 MG TABLET    Take one each morning and one after each loose stool up to 6 times in 24 hours.   SACCHAROMYCES BOULARDII (FLORASTOR) 250 MG CAPSULE    One twice daily to restore good gut bacteria     Review of Systems  Constitutional: Negative for fever, diaphoresis, activity change, appetite change, fatigue and unexpected weight change.  HENT: Positive for hearing loss. Negative for ear pain.   Eyes: Positive for visual disturbance.       Diminished visual acuity in the left eye due to cataract.  Respiratory: Positive for shortness of breath (on exertion).   Cardiovascular: Negative for chest pain, palpitations and leg swelling.  Gastrointestinal: Negative.        Mild, intermittent reflux symptoms.  Endocrine:       History of hyperglycemia  Genitourinary: Negative.   Musculoskeletal: Positive for back pain, arthralgias and gait problem.       Chronic generalized mild arthritis. She has right knee discomfort. There is no joint swelling.Using a walker.  Skin:       Complains of dry skin. Sees Dr. Steinhelfer.  Neurological: Negative.   Hematological: Negative.   Psychiatric/Behavioral: Negative.     Filed Vitals:   01/04/16 0902  BP: 122/62  Pulse: 66  Temp: 97.3 F (36.3 C)  TempSrc: Oral  Height: 5' 2" (1.575 m)    Weight: 113 lb (51.256 kg)  SpO2: 99%   Wt Readings from Last 3 Encounters:  01/04/16 113 lb (51.256 kg)  12/28/15 115 lb (52.164 kg)  07/06/15 121 lb 9.6 oz (55.157 kg)    Body mass index is 20.66 kg/(m^2).  Physical Exam  Constitutional: She is oriented to person, place, and time. She appears well-developed and well-nourished. No distress.  Thin body habitus.  Eyes:  Cataract left eye. Right lens implant. Left lower eyelid eversion.  Cardiovascular: Normal rate, regular rhythm and intact distal pulses.  Exam reveals no gallop and no friction rub.   Grade 1/6 systolic ejection murmur heard best along the sternal  border.  Pulmonary/Chest: Breath sounds normal. No respiratory distress. She has no wheezes. She has no rales. She exhibits no tenderness.  Abdominal: Bowel sounds are normal. She exhibits no distension and no mass. There is no tenderness.  Musculoskeletal: She exhibits tenderness. She exhibits no edema.  Tender in the right knee, left hip, wrists, and hands. Scoliosis.  Neurological: She is alert and oriented to person, place, and time. She has normal reflexes. No cranial nerve deficit. Coordination normal.  Intact vibratory sensation. 02/11/2013 MMSE 28/30. Passed clock drawing. 01/14/16 6-CIT 0/28  Skin: No rash noted. No erythema. No pallor.  Multiple SK on neck and trunk.  Psychiatric: She has a normal mood and affect. Her behavior is normal. Judgment and thought content normal.     Labs reviewed: Lab Summary Latest Ref Rng 12/30/2015 07/27/2014 02/02/2014  Hemoglobin 12.0 - 16.0 g/dL 13.4 (None) (None)  Hematocrit 36 - 46 % 39 (None) (None)  White count - 8.7 (None) (None)  Platelet count 150 - 399 K/L 341 (None) (None)  Sodium 137 - 147 mmol/L 132(A) 134(A) 136(A)  Potassium 3.4 - 5.3 mmol/L 3.2(A) 3.9 3.7  Calcium - (None) (None) (None)  Phosphorus - (None) (None) (None)  Creatinine 0.5 - 1.1 mg/dL 0.4(A) 0.6 0.6  AST 13 - 35 U/L _0 Alk Phos 25 - 125 U/L 42 50 48  Bilirubin - (None) (None) (None)  Glucose - 86 94 96  Cholesterol 0 - 200 mg/dL 203(A) 194 197  HDL cholesterol 35 - 70 mg/dL 80(A) 72(A) 71(A)  Triglycerides 40 - 160 mg/dL 88 71 82  LDL Direct - (None) (None) (None)  LDL Calc - 105 108 110  Total protein - (None) (None) (None)  Albumin - (None) (None) (None)   Lab Results  Component Value Date   TSH 1.29 12/30/2015   Lab Results  Component Value Date   BUN 10 12/30/2015   BUN 16 07/27/2014   BUN 13 02/02/2014   Lab Results  Component Value Date   CREATININE 0.4* 12/30/2015   CREATININE 0.6 07/27/2014   CREATININE 0.6 02/02/2014      Assessment/Plan  1. Diarrhea, unspecified type Resolved  2. Essential hypertension controlled  3. Hyperglycemia normal  4. Hyperlipidemia controlled  5. Palpitations Present, but not troublesome  6. Chronic obstructive pulmonary disease, unspecified COPD type ([place]) asymptomatic

## 2016-02-17 ENCOUNTER — Other Ambulatory Visit: Payer: Self-pay | Admitting: Internal Medicine

## 2016-06-13 ENCOUNTER — Other Ambulatory Visit: Payer: Self-pay

## 2016-06-13 DIAGNOSIS — E785 Hyperlipidemia, unspecified: Secondary | ICD-10-CM

## 2016-06-13 DIAGNOSIS — I10 Essential (primary) hypertension: Secondary | ICD-10-CM

## 2016-06-14 ENCOUNTER — Other Ambulatory Visit: Payer: Self-pay

## 2016-06-14 DIAGNOSIS — I10 Essential (primary) hypertension: Secondary | ICD-10-CM

## 2016-06-14 DIAGNOSIS — E785 Hyperlipidemia, unspecified: Secondary | ICD-10-CM

## 2016-06-29 ENCOUNTER — Other Ambulatory Visit: Payer: Self-pay | Admitting: Internal Medicine

## 2016-06-29 LAB — COMPLETE METABOLIC PANEL WITH GFR
ALT: 8 U/L (ref 6–29)
AST: 15 U/L (ref 10–35)
Albumin: 4.2 g/dL (ref 3.6–5.1)
Alkaline Phosphatase: 51 U/L (ref 33–130)
BUN: 18 mg/dL (ref 7–25)
CHLORIDE: 95 mmol/L — AB (ref 98–110)
CO2: 28 mmol/L (ref 20–31)
Calcium: 9.4 mg/dL (ref 8.6–10.4)
Creat: 0.63 mg/dL (ref 0.60–0.88)
GFR, EST NON AFRICAN AMERICAN: 79 mL/min (ref >=60)
Glucose, Bld: 91 mg/dL (ref 65–99)
POTASSIUM: 3.8 mmol/L (ref 3.5–5.3)
Sodium: 135 mmol/L (ref 135–146)
Total Bilirubin: 0.5 mg/dL (ref 0.2–1.2)
Total Protein: 6.6 g/dL (ref 6.1–8.1)

## 2016-06-29 LAB — LIPID PANEL
CHOL/HDL RATIO: 2.2 ratio (ref <5.0)
Cholesterol: 193 mg/dL (ref <200)
HDL: 86 mg/dL (ref >50)
LDL Cholesterol: 96 mg/dL
Triglycerides: 54 mg/dL (ref <150)
VLDL: 11 mg/dL (ref <30)

## 2016-07-04 ENCOUNTER — Non-Acute Institutional Stay: Payer: Medicare Other | Admitting: Internal Medicine

## 2016-07-04 ENCOUNTER — Encounter: Payer: Self-pay | Admitting: Internal Medicine

## 2016-07-04 VITALS — BP 148/84 | HR 52 | Temp 97.4°F | Ht 62.0 in | Wt 112.0 lb

## 2016-07-04 DIAGNOSIS — I10 Essential (primary) hypertension: Secondary | ICD-10-CM | POA: Diagnosis not present

## 2016-07-04 DIAGNOSIS — M1612 Unilateral primary osteoarthritis, left hip: Secondary | ICD-10-CM | POA: Diagnosis not present

## 2016-07-04 DIAGNOSIS — R739 Hyperglycemia, unspecified: Secondary | ICD-10-CM | POA: Diagnosis not present

## 2016-07-04 DIAGNOSIS — E785 Hyperlipidemia, unspecified: Secondary | ICD-10-CM

## 2016-07-04 DIAGNOSIS — R002 Palpitations: Secondary | ICD-10-CM

## 2016-07-04 DIAGNOSIS — R2681 Unsteadiness on feet: Secondary | ICD-10-CM | POA: Diagnosis not present

## 2016-07-04 DIAGNOSIS — J449 Chronic obstructive pulmonary disease, unspecified: Secondary | ICD-10-CM | POA: Diagnosis not present

## 2016-07-04 NOTE — Progress Notes (Signed)
Patient ID: Terri Acosta, female   DOB: May 03, 1923, 82 y.o.   MRN: [id number]    HISTORY AND PHYSICAL  Location:    [facility]   Place of Service: Clinic (12)   Extended Emergency Contact Information Primary Emergency Contact: Wilhemena Durie [place] Phone: [phone] Mobile Phone: [phone] Relation: Daughter  Advanced Directive information Does patient have an advance directive?: Yes, Type of Advance Directive: Healthcare Power of [place];Living will  Chief Complaint  Patient presents with  . Annual Exam    [place] exam  . Medical Management of Chronic Issues    blood pressure, hyperglycemia, cholesterol, COPD, review labs  . MMSE    30/30 passed clock     HPI:  Generally feeling well.   Essential hypertension -  mild elevation in the SBP. Taking heer medications regularly. No headache or chest pain.  Hyperglycemia - normal on last lab  Hyperlipidemia, unspecified hyperlipidemia type - controlled  Chronic obstructive pulmonary disease, unspecified COPD type ([place]) - mild dyspnea with exertion.   Unstable gait - using 4 wheel walker to help with balance. No falls.  Palpitations - rare at night.  Primary osteoarthritis of left hip - pain with movement. Use of the walker helps.    Past Medical History:  Diagnosis Date  . Actinic keratosis 02/13/2012  . Acute bronchospasm 10/31/2011  . Cancer ([place]) 04/2008   Skin cancer of low back [name], MD  . Cataract   . Disorder of bone and cartilage, unspecified 02/18/2007  . Dizziness and giddiness 08/30/2010  . Edema 12/19/2011  . 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 Osteoarthrosis, unspecified whether generalized or localized, unspecified site 08/20/2006  . Other abnormal blood chemistry 03/14/2011  . Other and unspecified hyperlipidemia 10/18/2010  . Palpitations 02/13/2012  . Postmenopausal bleeding 08/30/2010  . Shortness of breath 11/28/2011  . Supraventricular premature beats  05/23/2011  . Unspecified essential hypertension 08/20/2006    Past Surgical History:  Procedure Laterality Date  . CARPAL TUNNEL RELEASE  2004   S. Norris MD  . CATARACT EXTRACTION W/ INTRAOCULAR LENS IMPLANT Right 2010  . CATARACT EXTRACTION W/ INTRAOCULAR LENS IMPLANT Left 10/2012  . CESAREAN SECTION  [phone]   x3  . EYE SURGERY Right 07/2009   cataract extraction/IOLI [name], MD  . KNEE ARTHROSCOPY Right 2008   Torn meniscus, Dr. Veverly Fells    Patient Care Team: Estill Dooms, MD as PCP - General (Internal Medicine) [facility] Netta Cedars, MD as Consulting Physician (Orthopedic Surgery) Luberta Mutter, MD as Consulting Physician (Ophthalmology) Sydnee Levans, MD as Consulting Physician (Dermatology) Adrian Prows, MD as Consulting Physician (Cardiology)  Social History   Social History  . Marital status: Widowed    Spouse name: N/A  . Number of children: N/A  . Years of education: N/A   Occupational History  . Not on file.   Social History Main Topics  . Smoking status: Former Smoker    Quit date: 02/11/1978  . Smokeless tobacco: Never Used  . Alcohol use 0.6 oz/week    1 Glasses of wine per week     Comment: nightly  . Drug use: No  . Sexual activity: No   Other Topics Concern  . Not on file   Social History Narrative   Lives alone in a apartment at [facility] in the independent living area since 2007   The patient is not exercising regularly, walking with walker   No specific diet.   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   She has a living will, POA   Former smoker, stopped 1979   Alcohol - one glass of wine at night    reports that she quit smoking about 38 years ago. She has never used smokeless tobacco. She reports that she drinks about 0.6 oz of alcohol per week . She reports that she does not use drugs.  Family History  Problem Relation Age of Onset  . Diabetes Brother   . COPD Brother    Family Status  Relation  Status  . Daughter Alive  . Mother Deceased at age 85   natural causes  . Father Deceased at age 29   natural causes  . Sister Alive  . Brother Deceased at age 15   COPD  . Son Alive  . Sister Alive   History of syringamyelia  . Son Alive    Immunization History  Administered Date(s) Administered  . Influenza Whole 08/21/2010, 05/21/2012  . Influenza-Unspecified 06/04/2014, 05/20/2015, 06/08/2016  . Pneumococcal Conjugate-13 08/22/1999  . Td 08/22/1999  . Zoster 08/21/2005    Allergies  Allergen Reactions  . Lomotil [Diphenoxylate] Nausea And Vomiting  . Sulfa Antibiotics     Medications: Patient's Medications  New Prescriptions   No medications on file  Previous Medications   ASPIRIN 81 MG TABLET    Take 81 mg by mouth daily. Take 1 tablet daily for heart attack and stroke.   LOSARTAN-HYDROCHLOROTHIAZIDE (HYZAAR) 100-25 MG TABLET    TAKE 1 TABLET DAILY TO CONTROL BLOOD PRESSURE   METOPROLOL (LOPRESSOR) 50 MG TABLET    TAKE 1 TABLET TWICE A DAY TO HELP HEART RHYTHM AND BLOOD PRESSURE  Modified Medications   No medications on file  Discontinued Medications   ALBUTEROL (PROVENTIL HFA;VENTOLIN HFA) 108 (90 BASE) MCG/ACT INHALER    Inhale 2 puffs into the lungs every 6 (six) hours as needed for wheezing. Take 2 puffs prior to exertion up to four times daily to help breathing.   CALCIUM CARB-CHOLECALCIFEROL (CALCIUM + D3) 600-200 MG-UNIT TABS    Take 600 mg by mouth. Take 2 tablets daily as Vit. D supplement.   DEXTROMETHORPHAN (DELSYM) 30 MG/5ML LIQUID    Take 60 mg by mouth as needed for cough. Take 1 tsp every 12 hours as needed for cough.   DIPHENOXYLATE-ATROPINE (LOMOTIL) 2.5-0.025 MG TABLET    TAKE 1 TABLET BY MOUTH EVERY MORNING AND 1 AFTER EACH LOOSE STOOL UP TO 6 TIMES IN 24 HOURS    Review of Systems  Constitutional: Negative for activity change, appetite change, diaphoresis, fatigue, fever and unexpected weight change.  HENT: Positive for hearing loss. Negative  for ear pain.   Eyes: Positive for visual disturbance.       Diminished visual acuity in the left eye due to cataract.  Respiratory: Positive for shortness of breath (on exertion).   Cardiovascular: Negative for chest pain, palpitations and leg swelling.  Gastrointestinal: Negative.        Mild, intermittent reflux symptoms.  Endocrine:       History of hyperglycemia  Genitourinary: Negative.   Musculoskeletal: Positive for arthralgias, back pain and gait problem.       Chronic generalized mild arthritis. She has right knee discomfort. There is no joint swelling.Using a walker.  Skin:       Complains of dry skin. Sees Dr. Derrel Nip.  Hematological: Negative.   Psychiatric/Behavioral: Negative.     Vitals:   07/04/16 1010  BP: (!) 148/84  Pulse: (!) 52  Temp: 97.4 F (36.3 C)  TempSrc: Oral  SpO2: 96%  Weight: 112 lb (50.8 kg)  Height: _0  (1.575 m)   Body mass index is 20.49 kg/m. Filed Weights   07/04/16 1010  Weight: 112 lb (50.8 kg)     Physical Exam  Constitutional: She is oriented to person, place, and time. She appears well-developed and well-nourished. No distress.  Thin body habitus.  Eyes:  Cataract left eye. Right lens implant. Left lower eyelid eversion.  Cardiovascular: Normal rate, regular rhythm and intact distal pulses.  Exam reveals no gallop and no friction rub.   Grade 1/6 systolic ejection murmur heard best along the sternal border.  Pulmonary/Chest: Breath sounds normal. No respiratory distress. She has no wheezes. She has no rales. She exhibits no tenderness.  Abdominal: Bowel sounds are normal. She exhibits no distension and no mass. There is no tenderness.  Musculoskeletal: She exhibits tenderness. She exhibits no edema.  Tender in the right knee, left hip, wrists, and hands. Scoliosis.  Neurological: She is alert and oriented to person, place, and time. She has normal reflexes. No cranial nerve deficit. Coordination normal.  Intact  vibratory sensation. 02/11/2013 MMSE 28/30. Passed clock drawing. 01/14/16 6-CIT 0/28  Skin: No rash noted. No erythema. No pallor.  Multiple SK on neck and trunk.  Psychiatric: She has a normal mood and affect. Her behavior is normal. Judgment and thought content normal.    Labs reviewed: Lab Summary Latest Ref Rng & Units 06/29/2016 12/30/2015 07/27/2014  Hemoglobin 12.0 - 16.0 g/dL (None) 13.4 (None)  Hematocrit 36 - 46 % (None) 39 (None)  White count 10:3/mL (None) 8.7 (None)  Platelet count 150 - 399 K/L (None) 341 (None)  Sodium 135 - 146 mmol/L 135 132(A) 134(A)  Potassium 3.5 - 5.3 mmol/L 3.8 3.2(A) 3.9  Calcium 8.6 - 10.4 mg/dL 9.4 (None) (None)  Phosphorus - (None) (None) (None)  Creatinine 0.60 - 0.88 mg/dL 0.63 0.4(A) 0.6  AST 10 - 35 U/L _1 Alk Phos 33 - 130 U/L 51 42 50  Bilirubin 0.2 - 1.2 mg/dL 0.5 (None) (None)  Glucose 65 - 99 mg/dL 91 86 94  Cholesterol <200 mg/dL 193 203(A) 194  HDL cholesterol >50 mg/dL 86 80(A) 72(A)  Triglycerides <150 mg/dL 54 88 71  LDL Direct - (None) (None) (None)  LDL Calc mg/dL 96 105 108  Total protein 6.1 - 8.1 g/dL 6.6 (None) (None)  Albumin 3.6 - 5.1 g/dL 4.2 (None) (None)  Some recent data might be hidden   Lab Results  Component Value Date   BUN 18 06/29/2016   No results found for: HGBA1C Lab Results  Component Value Date   TSH 1.29 12/30/2015     Assessment/Plan  1. Essential hypertension Mild elevation of the SBP. Patient is 89 years. No additional medication felt necessary.  2. Hyperglycemia controlled  3. Hyperlipidemia, unspecified hyperlipidemia type controlled  4. Chronic obstructive pulmonary disease, unspecified COPD type ([place]) Off all medications at this time  5. Unstable gait Continue use of walker  6. Palpitations rare  7. Primary osteoarthritis of left hip Continue Aleve bid

## 2016-09-05 ENCOUNTER — Other Ambulatory Visit: Payer: Self-pay | Admitting: Internal Medicine

## 2016-09-26 ENCOUNTER — Encounter: Payer: Self-pay | Admitting: Internal Medicine

## 2016-11-13 ENCOUNTER — Other Ambulatory Visit: Payer: Self-pay | Admitting: Internal Medicine

## 2016-12-26 ENCOUNTER — Encounter: Payer: Self-pay | Admitting: Internal Medicine

## 2016-12-26 ENCOUNTER — Non-Acute Institutional Stay: Payer: Medicare Other | Admitting: Internal Medicine

## 2016-12-26 VITALS — BP 160/72 | HR 56 | Temp 97.6°F | Ht 62.0 in | Wt 112.0 lb

## 2016-12-26 DIAGNOSIS — M25562 Pain in left knee: Secondary | ICD-10-CM | POA: Diagnosis not present

## 2016-12-26 DIAGNOSIS — I10 Essential (primary) hypertension: Secondary | ICD-10-CM | POA: Diagnosis not present

## 2016-12-26 DIAGNOSIS — R002 Palpitations: Secondary | ICD-10-CM | POA: Diagnosis not present

## 2016-12-26 DIAGNOSIS — G8929 Other chronic pain: Secondary | ICD-10-CM | POA: Insufficient documentation

## 2016-12-26 DIAGNOSIS — R2681 Unsteadiness on feet: Secondary | ICD-10-CM | POA: Diagnosis not present

## 2016-12-26 DIAGNOSIS — M25569 Pain in unspecified knee: Secondary | ICD-10-CM

## 2016-12-26 DIAGNOSIS — M25561 Pain in right knee: Secondary | ICD-10-CM

## 2016-12-26 DIAGNOSIS — M1612 Unilateral primary osteoarthritis, left hip: Secondary | ICD-10-CM | POA: Diagnosis not present

## 2016-12-26 MED ORDER — DOXAZOSIN MESYLATE 4 MG PO TABS: ORAL_TABLET | ORAL | 3 refills | Status: DC

## 2016-12-26 NOTE — Progress Notes (Signed)
Facility  [facility]    Place of Service: Clinic (12)     Allergies  Allergen Reactions  . Lomotil [Diphenoxylate] Nausea And Vomiting  . Sulfa Antibiotics     Chief Complaint  Patient presents with  . Medical Management of Chronic Issues    6 month medication management blood pressure, cholesterol, COPD.   [name] knees    saw Dr. Veverly Fells last week, would like to talk with Dr. Nyoka Cowden about it.     HPI:  Saw Dr. Veverly Fells last week for knee problems. Right is worse than the left. He also says the left hip needs to be replaced. She got injx in knees and is scheduled for hip injx by Dr. Nelva Bush on 12/28/16.  Essential hypertension - elevated SBP here and at home  Chronic pain of both knees - Using Aleve bid  Palpitations - rapid runs  Primary osteoarthritis of left hip - go for injx as scheduled  Unstable gait - using 4 wheel walkeer    Medications: Patient's Medications  New Prescriptions   No medications on file  Previous Medications   ASPIRIN 81 MG TABLET    Take 81 mg by mouth daily. Take 1 tablet daily for heart attack and stroke.   LOSARTAN-HYDROCHLOROTHIAZIDE (HYZAAR) 100-25 MG TABLET    TAKE 1 TABLET DAILY TO CONTROL BLOOD PRESSURE   METOPROLOL (LOPRESSOR) 50 MG TABLET    TAKE 1 TABLET TWICE A DAY TO HELP HEART RHYTHM AND BLOOD PRESSURE  Modified Medications   No medications on file  Discontinued Medications   No medications on file     Review of Systems  Constitutional: Negative for activity change, appetite change, diaphoresis, fatigue, fever and unexpected weight change.  HENT: Positive for hearing loss. Negative for ear pain.   Eyes: Positive for visual disturbance.       Diminished visual acuity in the left eye due to cataract.  Respiratory: Positive for shortness of breath (on exertion).   Cardiovascular: Negative for chest pain, palpitations and leg swelling.  Gastrointestinal: Negative.        Mild, intermittent reflux symptoms.  Endocrine:       History of  hyperglycemia  Genitourinary: Negative.   Musculoskeletal: Positive for arthralgias, back pain and gait problem.       Chronic generalized mild arthritis. She has right knee discomfort. There is no joint swelling.Using a walker.  Skin:       Complains of dry skin. Sees Dr. Derrel Nip.  Hematological: Negative.   Psychiatric/Behavioral: Negative.     Vitals:   12/26/16 1053  BP: (!) 160/72  Pulse: (!) 56  Temp: 97.6 F (36.4 C)  TempSrc: Oral  SpO2: 95%  Weight: 112 lb (50.8 kg)  Height: '5\' 2"'  (1.575 m)   Wt Readings from Last 3 Encounters:  12/26/16 112 lb (50.8 kg)  07/04/16 112 lb (50.8 kg)  01/04/16 113 lb (51.3 kg)    Body mass index is 20.49 kg/m.  Physical Exam  Constitutional: She is oriented to person, place, and time. She appears well-developed and well-nourished. No distress.  Thin body habitus.  Eyes:  Cataract left eye. Right lens implant. Left lower eyelid eversion.  Cardiovascular: Normal rate, regular rhythm and intact distal pulses.  Exam reveals no gallop and no friction rub.   Grade 1/6 systolic ejection murmur heard best along the sternal border.  Pulmonary/Chest: Breath sounds normal. No respiratory distress. She has no wheezes. She has no rales. She exhibits no tenderness.  Abdominal: Bowel  sounds are normal. She exhibits no distension and no mass. There is no tenderness.  Musculoskeletal: She exhibits tenderness. She exhibits no edema.  Tender in the right knee, left hip, wrists, and hands. Scoliosis.  Neurological: She is alert and oriented to person, place, and time. She has normal reflexes. No cranial nerve deficit. Coordination normal.  Intact vibratory sensation. 02/11/2013 MMSE 28/30. Passed clock drawing. 01/14/16 6-CIT 0/28  Skin: No rash noted. No erythema. No pallor.  Multiple SK on neck and trunk.  Psychiatric: She has a normal mood and affect. Her behavior is normal. Judgment and thought content normal.     Labs reviewed: Lab  Summary Latest Ref Rng & Units 06/29/2016 12/30/2015 07/27/2014  Hemoglobin 12.0 - 16.0 g/dL (None) 13.4 (None)  Hematocrit 36 - 46 % (None) 39 (None)  White count 10:3/mL (None) 8.7 (None)  Platelet count 150 - 399 K/L (None) 341 (None)  Sodium 135 - 146 mmol/L 135 132(A) 134(A)  Potassium 3.5 - 5.3 mmol/L 3.8 3.2(A) 3.9  Calcium 8.6 - 10.4 mg/dL 9.4 (None) (None)  Phosphorus - (None) (None) (None)  Creatinine 0.60 - 0.88 mg/dL 0.63 0.4(A) 0.6  AST 10 - 35 U/L '15 21 15  ' Alk Phos 33 - 130 U/L 51 42 50  Bilirubin 0.2 - 1.2 mg/dL 0.5 (None) (None)  Glucose 65 - 99 mg/dL 91 86 94  Cholesterol <200 mg/dL 193 203(A) 194  HDL cholesterol >50 mg/dL 86 80(A) 72(A)  Triglycerides <150 mg/dL 54 88 71  LDL Direct - (None) (None) (None)  LDL Calc mg/dL 96 105 108  Total protein 6.1 - 8.1 g/dL 6.6 (None) (None)  Albumin 3.6 - 5.1 g/dL 4.2 (None) (None)  Some recent data might be hidden   Lab Results  Component Value Date   TSH 1.29 12/30/2015   Lab Results  Component Value Date   BUN 18 06/29/2016   BUN 10 12/30/2015   BUN 16 07/27/2014   Lab Results  Component Value Date   CREATININE 0.63 06/29/2016   CREATININE 0.4 (A) 12/30/2015   CREATININE 0.6 07/27/2014   No results found for: HGBA1C     Assessment/Plan  1. Essential hypertension Continue current medications and add - doxazosin (CARDURA) 4 MG tablet; One daily to help control BP  Dispense: 90 tablet; Refill: 3  2. Chronic pain of both knees Continue injx and Aleve  3. Palpitations She would need Cardiology evaluation prior to any surgery. Has seen Dr. Einar Gip in the past.  4. Primary osteoarthritis of left hip Scheduled for injx by Dr. Nelva Bush  5. Unstable gait Use walker

## 2017-01-02 ENCOUNTER — Encounter: Payer: Self-pay | Admitting: Internal Medicine

## 2017-01-22 ENCOUNTER — Encounter: Payer: Self-pay | Admitting: Internal Medicine

## 2017-01-30 ENCOUNTER — Encounter: Payer: Self-pay | Admitting: Internal Medicine

## 2017-04-17 ENCOUNTER — Encounter: Payer: Medicare Other | Admitting: Internal Medicine

## 2017-05-25 ENCOUNTER — Telehealth: Payer: Self-pay | Admitting: Internal Medicine

## 2017-05-25 NOTE — Telephone Encounter (Signed)
I left a message asking the patient to schedule her AWV-I at [facility] clinic on afternoon of 10/24 or 10/31. VDM (DD)

## 2017-06-04 ENCOUNTER — Other Ambulatory Visit: Payer: Self-pay | Admitting: Internal Medicine

## 2017-06-20 NOTE — Telephone Encounter (Signed)
Left another msg attempting to schedule AWV-I. VDM (DD)

## 2017-07-17 ENCOUNTER — Encounter: Payer: Medicare Other | Admitting: Internal Medicine

## 2017-07-18 ENCOUNTER — Encounter: Payer: Self-pay | Admitting: Internal Medicine

## 2017-07-18 ENCOUNTER — Non-Acute Institutional Stay: Payer: Medicare Other | Admitting: Internal Medicine

## 2017-07-18 VITALS — BP 138/84 | HR 52 | Temp 97.8°F | Resp 16 | Ht 62.0 in | Wt 114.0 lb

## 2017-07-18 DIAGNOSIS — M159 Polyosteoarthritis, unspecified: Secondary | ICD-10-CM

## 2017-07-18 DIAGNOSIS — R2681 Unsteadiness on feet: Secondary | ICD-10-CM | POA: Diagnosis not present

## 2017-07-18 DIAGNOSIS — R002 Palpitations: Secondary | ICD-10-CM | POA: Diagnosis not present

## 2017-07-18 DIAGNOSIS — J449 Chronic obstructive pulmonary disease, unspecified: Secondary | ICD-10-CM

## 2017-07-18 DIAGNOSIS — R739 Hyperglycemia, unspecified: Secondary | ICD-10-CM | POA: Diagnosis not present

## 2017-07-18 DIAGNOSIS — E785 Hyperlipidemia, unspecified: Secondary | ICD-10-CM

## 2017-07-18 DIAGNOSIS — I10 Essential (primary) hypertension: Secondary | ICD-10-CM

## 2017-07-18 DIAGNOSIS — M150 Primary generalized (osteo)arthritis: Secondary | ICD-10-CM | POA: Diagnosis not present

## 2017-07-18 MED ORDER — ASPIRIN EC 81 MG PO TBEC: 81.0000 mg | DELAYED_RELEASE_TABLET | Freq: Every day | ORAL | 0 refills | Status: DC

## 2017-07-18 NOTE — Progress Notes (Signed)
[facility]  Provider: Blanchie Serve MD   Location:  [place] of Service:  Clinic (12)  PCP: Blanchie Serve, MD Patient Care Team: Blanchie Serve, MD as PCP - General (Internal Medicine) [place], [facility] Netta Cedars, MD as Consulting Physician (Orthopedic Surgery) Luberta Mutter, MD as Consulting Physician (Ophthalmology) Sydnee Levans, MD as Consulting Physician (Dermatology) Adrian Prows, MD as Consulting Physician (Cardiology)  Extended Emergency Contact Information Primary Emergency Contact: Wilhemena Durie [place] of [place] Phone: [phone] Mobile Phone: [phone] Relation: Daughter  Code Status: DNR  Goals of Care: Advanced Directive information Advanced Directives 07/18/2017  Does Patient Have a Medical Advance Directive? Yes  Type of Paramedic of Advance;Living will  Does patient want to make changes to medical advance directive? -  Copy of [place] in Chart? Yes      Chief Complaint  Patient presents with  . Establish Care    previous patient of Dr. Nyoka Cowden    HPI: Patient is a 83 y.o. female seen today for routine visit.   OA- To her knee and hip. S/p steroid injection to knee and hip in past with some relief. Currently taking naproxen 250 mg bid for pain with some help. Mobility has been difficult with her DJD mainly to right knee.   Hypertension- denies any symptom. Currently on lopressor 50 mg bid, losartan-hctz 50-12.5 mg and baby aspirin  palpitation- occasional, denies chest pain. Currently on metoprolol tartrate 50 mg bid with some help.   Unsteady gait- uses walker, no fall reported. Denies dizziness.   Past Medical History:  Diagnosis Date  . Actinic keratosis 02/13/2012  . Acute bronchospasm 10/31/2011  . Cancer ([place]) 04/2008   Skin cancer of low back [name], MD  . Cataract   . Disorder of bone and cartilage, unspecified  02/18/2007  . Dizziness and giddiness 08/30/2010  . Edema 12/19/2011  . 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 Osteoarthrosis, unspecified whether generalized or localized, unspecified site 08/20/2006  . Other abnormal blood chemistry 03/14/2011  . Other and unspecified hyperlipidemia 10/18/2010  . Palpitations 02/13/2012  . Postmenopausal bleeding 08/30/2010  . Shortness of breath 11/28/2011  . Supraventricular premature beats 05/23/2011  . Unspecified essential hypertension 08/20/2006   Past Surgical History:  Procedure Laterality Date  . CARPAL TUNNEL RELEASE  2004   S. Norris MD  . CATARACT EXTRACTION W/ INTRAOCULAR LENS IMPLANT Right 2010  . CATARACT EXTRACTION W/ INTRAOCULAR LENS IMPLANT Left 10/2012  . CESAREAN SECTION  [id number]   x3  . EYE SURGERY Right 07/2009   cataract extraction/IOLI [name], MD  . KNEE ARTHROSCOPY Right 2008   Torn meniscus, Dr. Veverly Fells    reports that she quit smoking about 39 years ago. she has never used smokeless tobacco. She reports that she drinks about 0.6 oz of alcohol per week. She reports that she does not use drugs. Social History   Socioeconomic History  . Marital status: Widowed    Spouse name: Not on file  . Number of children: Not on file  . Years of education: Not on file  . Highest education level: Not on file  Social Needs  . Financial resource strain: Not on file  . Food insecurity - worry: Not on file  . Food insecurity - inability: Not on file  . Transportation needs - medical: Not on file  . Transportation needs - non-medical: Not on file  Occupational History  . Not  on file  Tobacco Use  . Smoking status: Former Smoker    Last attempt to quit: 02/11/1978    Years since quitting: 39.4  . Smokeless tobacco: Never Used  Substance and Sexual Activity  . Alcohol use: Yes    Alcohol/week: 0.6 oz    Types: 1 Glasses of wine per week    Comment: nightly  . Drug use: No  . Sexual activity: No  Other Topics  Concern  . Not on file  Social History Narrative   Lives alone in a apartment at [facility] in the independent living area since 2007   The patient is not exercising regularly, walking with walker   No specific diet.   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   She has a living will, POA   Former smoker, stopped 1979   Alcohol - one glass of wine at night    Functional Status Survey:    Family History  Problem Relation Age of Onset  . Diabetes Brother   . COPD Brother     Health Maintenance  Topic Date Due  . DEXA SCAN  04/12/1990  . PNA vac Low Risk Adult (2 of 2 - PPSV23) 08/21/2000  . TETANUS/TDAP  08/21/2009  . INFLUENZA VACCINE  Completed    Allergies  Allergen Reactions  . Lomotil [Diphenoxylate] Nausea And Vomiting  . Sulfa Antibiotics   . Amlodipine Swelling    Outpatient Encounter Medications as of 07/18/2017  Medication Sig  . aspirin 81 MG tablet Take 81 mg by mouth daily. Take 1 tablet daily for heart attack and stroke.  [name] losartan-hydrochlorothiazide (HYZAAR) 50-12.5 MG tablet Take 1 tablet by mouth daily.  . metoprolol tartrate (LOPRESSOR) 50 MG tablet Take 50 mg by mouth 2 (two) times daily.  . naproxen (NAPROSYN) 250 MG tablet Take 250 mg by mouth 2 (two) times daily with a meal.  . doxazosin (CARDURA) 4 MG tablet One daily to help control BP (Patient not taking: Reported on 07/18/2017)  . [DISCONTINUED] losartan-hydrochlorothiazide (HYZAAR) 100-25 MG tablet TAKE 1 TABLET DAILY TO CONTROL BLOOD PRESSURE  . [DISCONTINUED] metoprolol tartrate (LOPRESSOR) 50 MG tablet TAKE 1 TABLET TWICE A DAY TO HELP HEART RHYTHM AND BLOOD PRESSURE   No facility-administered encounter medications on file as of 07/18/2017.     Review of Systems  Constitutional: Negative for appetite change, chills, fatigue and fever.  HENT: Positive for hearing loss and rhinorrhea. Negative for congestion, ear discharge, ear pain, sinus pressure, sinus pain, sore throat and  trouble swallowing.   Eyes: Positive for visual disturbance. Negative for pain, redness and itching.       Wears corrective glasses. Due for eye exam.   Respiratory: Negative for cough and shortness of breath.   Cardiovascular: Positive for palpitations. Negative for chest pain and leg swelling.       Some swelling around her ankle towards end of the day.   Gastrointestinal: Negative for abdominal pain, constipation, diarrhea, nausea and vomiting.  Genitourinary: Positive for frequency. Negative for dysuria, hematuria and urgency.  Musculoskeletal: Positive for arthralgias and gait problem. Negative for back pain.       Uses a walker to ambulate, no fall reported- right knee and left hip bothers her.   Skin: Negative for rash and wound.  Neurological: Negative for dizziness, tremors, seizures, numbness and headaches.  Hematological: Bruises/bleeds easily.  Psychiatric/Behavioral: Negative for behavioral problems, confusion and sleep disturbance. The patient is not nervous/anxious.  PHQ-2 score 0    Vitals:   07/18/17 0931  BP: 138/84  Pulse: (!) 52  Resp: 16  Temp: 97.8 F (36.6 C)  TempSrc: Oral  SpO2: 96%  Weight: 114 lb (51.7 kg)  Height: _0  (1.575 m)   Body mass index is 20.85 kg/m.   Wt Readings from Last 3 Encounters:  07/18/17 114 lb (51.7 kg)  12/26/16 112 lb (50.8 kg)  07/04/16 112 lb (50.8 kg)   Physical Exam  Constitutional: She is oriented to person, place, and time. She appears well-developed and well-nourished. No distress.  HENT:  Head: Normocephalic and atraumatic.  Mouth/Throat: Oropharynx is clear and moist. No oropharyngeal exudate.  Eyes: Conjunctivae and EOM are normal. Left eye exhibits no discharge.  Corrective glasses  Neck: Neck supple.  Cardiovascular: Normal rate and regular rhythm.  Murmur heard. Pulmonary/Chest: Effort normal and breath sounds normal. She has no wheezes. She has no rales.  Abdominal: Soft. Bowel sounds are normal.  She exhibits no distension. There is no rebound and no guarding.  Musculoskeletal: She exhibits edema.  Trace leg edema, arthritis changes to fingers, kyphosis present, unsteady gait, uses four wheel walker  Lymphadenopathy:    She has no cervical adenopathy.  Neurological: She is alert and oriented to person, place, and time.  Skin: Skin is warm and dry. She is not diaphoretic.  Psychiatric: She has a normal mood and affect.    Labs reviewed: Basic Metabolic Panel: No results for input(s): NA, K, CL, CO2, GLUCOSE, BUN, CREATININE, CALCIUM, MG, PHOS in the last 8760 hours. Liver Function Tests: No results for input(s): AST, ALT, ALKPHOS, BILITOT, PROT, ALBUMIN in the last 8760 hours. No results for input(s): LIPASE, AMYLASE in the last 8760 hours. No results for input(s): AMMONIA in the last 8760 hours. CBC: No results for input(s): WBC, NEUTROABS, HGB, HCT, MCV, PLT in the last 8760 hours. Cardiac Enzymes: No results for input(s): CKTOTAL, CKMB, CKMBINDEX, TROPONINI in the last 8760 hours. BNP: Invalid input(s): POCBNP No results found for: HGBA1C Lab Results  Component Value Date   TSH 1.29 12/30/2015   No results found for: VITAMINB12 No results found for: FOLATE No results found for: IRON, TIBC, FERRITIN  Lipid Panel: No results for input(s): CHOL, HDL, LDLCALC, TRIG, CHOLHDL, LDLDIRECT in the last 8760 hours. No results found for: HGBA1C  Procedures since last visit: No results found.  Depression screen [facility] 2/9 07/18/2017 07/04/2016 01/04/2016  Decreased Interest 0 0 0  Down, Depressed, Hopeless 0 0 0  PHQ - 2 Score 0 0 0  Altered sleeping 0 - -  Tired, decreased energy 0 - -  Change in appetite 0 - -  Feeling bad or failure about yourself  0 - -  Trouble concentrating 0 - -  Moving slowly or fidgety/restless 0 - -  Suicidal thoughts 0 - -  PHQ-9 Score 0 - -   Assessment/Plan  1. Essential hypertension Continue metoprolol tartrate and losartan-hctz. Check  bmp. No dose changes. Continue baby aspirin but change this to [facility] with her being on naproxen. .  - CMP with eGFR; Future - CBC with Differential/Platelets; Future  2. Primary osteoarthritis involving multiple joints Continue naproxen bid with meals. Check bmp and cbc. Followed by orthopedics. Walker to ambulate.  - CBC with Differential/Platelets; Future  3. Palpitations Obtain KEG next visit. Continue b blocker for now. Obtain OV note from Dr Irven Shelling office for review - CBC with Differential/Platelets; Future - TSH; Future  4. Chronic obstructive  pulmonary disease, unspecified COPD type ([place]) Breathing stable, been a smoker in past, monitor  5. Hyperlipidemia, unspecified hyperlipidemia type - Lipid Panel; Future  6. Hyperglycemia - CMP with eGFR; Future - Lipid Panel; Future  7. Unstable gait Continue to use walker for ambulation. Pt would like an electric wheelchair to ambulate. I will need to review OV notes from orthopedic office to decide further. At present, strength adequate in all 4 extremities, knee ROM limited, no dyspnea.     Labs/tests ordered:   Lab Orders     CMP with eGFR     Lipid Panel     CBC with Differential/Platelets     TSH  Next appointment: 6 month physical, EKG, MMSE  Communication: reviewed care plan with patient and charge nurse.    Blanchie Serve, MD Internal Medicine [facility] [address] [place],  [address] Cell Phone (Monday-Friday 8 am - 5 pm): [phone] On Call: [phone] and follow prompts after 5 pm and on weekends Office Phone: [phone] Office Fax: [phone]

## 2017-07-18 NOTE — Patient Instructions (Signed)
  Stop taking your current aspirin and start taking enteric coated baby aspirin daily.   Get your blood work in next 1-2 weeks.  I will see you in 6 months for physical.

## 2017-07-19 ENCOUNTER — Encounter: Payer: Medicare Other | Admitting: Internal Medicine

## 2017-07-23 ENCOUNTER — Non-Acute Institutional Stay: Payer: Self-pay

## 2017-07-26 LAB — LIPID PANEL
Cholesterol: 178 mg/dL (ref <200)
HDL: 81 mg/dL (ref >50)
LDL Cholesterol (Calc): 84 mg/dL (calc)
Non-HDL Cholesterol (Calc): 97 mg/dL (calc) (ref <130)
Total CHOL/HDL Ratio: 2.2 (calc) (ref <5.0)
Triglycerides: 58 mg/dL (ref <150)

## 2017-07-26 LAB — COMPLETE METABOLIC PANEL WITH GFR
AG RATIO: 1.7 (calc) (ref 1.0–2.5)
ALBUMIN MSPROF: 3.8 g/dL (ref 3.6–5.1)
ALT: 7 U/L (ref 6–29)
AST: 15 U/L (ref 10–35)
Alkaline phosphatase (APISO): 46 U/L (ref 33–130)
BILIRUBIN TOTAL: 0.6 mg/dL (ref 0.2–1.2)
BUN / CREAT RATIO: 31 (calc) — AB (ref 6–22)
BUN: 15 mg/dL (ref 7–25)
CALCIUM: 9.3 mg/dL (ref 8.6–10.4)
CHLORIDE: 95 mmol/L — AB (ref 98–110)
CO2: 32 mmol/L (ref 20–32)
Creat: 0.48 mg/dL — ABNORMAL LOW (ref 0.60–0.88)
GFR, EST AFRICAN AMERICAN: 99 mL/min/{1.73_m2} (ref >=60)
GFR, EST NON AFRICAN AMERICAN: 85 mL/min/{1.73_m2} (ref >=60)
Globulin: 2.2 g/dL (calc) (ref 1.9–3.7)
Glucose, Bld: 93 mg/dL (ref 65–99)
POTASSIUM: 3.7 mmol/L (ref 3.5–5.3)
Sodium: 136 mmol/L (ref 135–146)
TOTAL PROTEIN: 6 g/dL — AB (ref 6.1–8.1)

## 2017-07-26 LAB — CBC WITH DIFFERENTIAL/PLATELET
BASOS ABS: 29 {cells}/uL (ref 0–200)
Basophils Relative: 0.4 %
EOS ABS: 310 {cells}/uL (ref 15–500)
Eosinophils Relative: 4.3 %
HCT: 36.4 % (ref 35.0–45.0)
HEMOGLOBIN: 12.1 g/dL (ref 11.7–15.5)
Lymphs Abs: 1433 cells/uL (ref 850–3900)
MCH: 27.8 pg (ref 27.0–33.0)
MCHC: 33.2 g/dL (ref 32.0–36.0)
MCV: 83.7 fL (ref 80.0–100.0)
MONOS PCT: 8.7 %
MPV: 10.5 fL (ref 7.5–12.5)
NEUTROS ABS: 4802 {cells}/uL (ref 1500–7800)
Neutrophils Relative %: 66.7 %
Platelets: 257 10*3/uL (ref 140–400)
RBC: 4.35 10*6/uL (ref 3.80–5.10)
RDW: 13.4 % (ref 11.0–15.0)
TOTAL LYMPHOCYTE: 19.9 %
WBC: 7.2 10*3/uL (ref 3.8–10.8)
WBCMIX: 626 {cells}/uL (ref 200–950)

## 2017-07-26 LAB — TSH: TSH: 2.28 mIU/L (ref 0.40–4.50)

## 2017-07-27 ENCOUNTER — Other Ambulatory Visit: Payer: Self-pay

## 2017-07-27 ENCOUNTER — Encounter: Payer: Self-pay | Admitting: Internal Medicine

## 2017-07-27 DIAGNOSIS — R739 Hyperglycemia, unspecified: Secondary | ICD-10-CM

## 2017-07-27 DIAGNOSIS — E785 Hyperlipidemia, unspecified: Secondary | ICD-10-CM

## 2017-07-27 DIAGNOSIS — M1991 Primary osteoarthritis, unspecified site: Secondary | ICD-10-CM

## 2017-07-27 DIAGNOSIS — I10 Essential (primary) hypertension: Secondary | ICD-10-CM

## 2017-07-27 DIAGNOSIS — R002 Palpitations: Secondary | ICD-10-CM

## 2017-07-27 DIAGNOSIS — N281 Cyst of kidney, acquired: Secondary | ICD-10-CM | POA: Insufficient documentation

## 2017-08-09 ENCOUNTER — Telehealth: Payer: Self-pay

## 2017-08-09 NOTE — Telephone Encounter (Signed)
Unable to reach the patient. Will attempt to contact again before I leave for the day.

## 2017-08-10 ENCOUNTER — Other Ambulatory Visit: Payer: Self-pay | Admitting: Internal Medicine

## 2017-09-02 ENCOUNTER — Other Ambulatory Visit: Payer: Self-pay | Admitting: Internal Medicine

## 2017-09-10 ENCOUNTER — Telehealth: Payer: Self-pay | Admitting: Internal Medicine

## 2017-09-10 NOTE — Telephone Encounter (Signed)
Left message asking pt to call me at [phone] to schedule AWV-I on morning of 09/12/17 at [facility] clinic if she's available. VDM (DD)

## 2017-09-12 ENCOUNTER — Non-Acute Institutional Stay: Payer: Medicare Other

## 2017-09-12 VITALS — BP 140/68 | HR 70 | Temp 97.8°F | Ht 62.0 in | Wt 114.0 lb

## 2017-09-12 DIAGNOSIS — Z0000 Encounter for general adult medical examination without abnormal findings: Secondary | ICD-10-CM | POA: Diagnosis not present

## 2017-09-12 NOTE — Progress Notes (Signed)
Subjective:   Terri Acosta is a 84 y.o. female who presents for an Initial Medicare Annual Wellness Visit at [facility]        Objective:    There were no vitals filed for this visit. There is no height or weight on file to calculate BMI.  Advanced Directives 07/18/2017 12/26/2016 07/04/2016 01/04/2016 12/28/2015 07/06/2015 03/09/2015  Does Patient Have a Medical Advance Directive? Yes Yes Yes Yes Yes Yes Yes  Type of Paramedic of [place];Living will [place];Living will [place];Living will [place];Living will [place];Living will Living will;Healthcare Power of [place];Living will  Does patient want to make changes to medical advance directive? - - - - - No - Patient declined -  Copy of Terri Acosta in Chart? Yes Yes Yes Yes Yes Yes Yes    Current Medications (verified) Outpatient Encounter Medications as of 09/12/2017  Medication Sig  . aspirin EC 81 MG tablet Take 1 tablet (81 mg total) by mouth daily.  [name] losartan-hydrochlorothiazide (HYZAAR) 100-25 MG tablet TAKE 1 TABLET DAILY TO CONTROL BLOOD PRESSURE  . metoprolol tartrate (LOPRESSOR) 50 MG tablet Take 50 mg by mouth 2 (two) times daily.  . metoprolol tartrate (LOPRESSOR) 50 MG tablet TAKE 1 TABLET TWICE A DAY TO HELP HEART RHYTHM AND BLOOD PRESSURE  . naproxen (NAPROSYN) 250 MG tablet Take 250 mg by mouth 2 (two) times daily with a meal.   No facility-administered encounter medications on file as of 09/12/2017.     Allergies (verified) Lomotil [diphenoxylate]; Sulfa antibiotics; and Amlodipine   History: Past Medical History:  Diagnosis Date  . Actinic keratosis 02/13/2012  . Acute bronchospasm 10/31/2011  . Cancer (Terri Acosta) 04/2008   Skin cancer of low back Terri Jews, MD  . Cataract   . Disorder of bone and cartilage, unspecified 02/18/2007    . Dizziness and giddiness 08/30/2010  . Edema 12/19/2011  . 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 Osteoarthrosis, unspecified whether generalized or localized, unspecified site 08/20/2006  . Other abnormal blood chemistry 03/14/2011  . Other and unspecified hyperlipidemia 10/18/2010  . Palpitations 02/13/2012  . Postmenopausal bleeding 08/30/2010  . Shortness of breath 11/28/2011  . Supraventricular premature beats 05/23/2011  . Unspecified essential hypertension 08/20/2006   Past Surgical History:  Procedure Laterality Date  . CARPAL TUNNEL RELEASE  2004   S. Norris MD  . CATARACT EXTRACTION W/ INTRAOCULAR LENS IMPLANT Right 2010  . CATARACT EXTRACTION W/ INTRAOCULAR LENS IMPLANT Left 10/2012  . CESAREAN SECTION  [phone]   x3  . EYE SURGERY Right 07/2009   cataract extraction/IOLI [name], MD  . KNEE ARTHROSCOPY Right 2008   Torn meniscus, Dr. Veverly Fells   Family History  Problem Relation Age of Onset  . Diabetes Brother   . COPD Brother    Social History   Socioeconomic History  . Marital status: Widowed    Spouse name: Not on file  . Number of children: Not on file  . Years of education: Not on file  . Highest education level: Not on file  Social Needs  . Financial resource strain: Not on file  . Food insecurity - worry: Not on file  . Food insecurity - inability: Not on file  . Transportation needs - medical: Not on file  . Transportation needs - non-medical: Not on file  Occupational History  . Not on file  Tobacco Use  .  Smoking status: Former Smoker    Last attempt to quit: 02/11/1978    Years since quitting: 39.6  . Smokeless tobacco: Never Used  Substance and Sexual Activity  . Alcohol use: Yes    Alcohol/week: 0.6 oz    Types: 1 Glasses of wine per week    Comment: nightly  . Drug use: No  . Sexual activity: No  Other Topics Concern  . Not on file  Social History Narrative   Lives alone in a apartment at [facility] in the  [place] living area since 2007   The patient is not exercising regularly, walking with walker   No specific diet.   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   She has a living will, POA   Former smoker, stopped 1979   Alcohol - one glass of wine at night    Tobacco Counseling Counseling given: Not Answered   Clinical Intake:                        Activities of Daily Living No flowsheet data found.   Immunizations and Health Maintenance Immunization History  Administered Date(s) Administered  . Influenza Whole 08/21/2010, 05/21/2012  . Influenza, High Dose Seasonal PF 05/30/2017  . Influenza-Unspecified 06/04/2014, 05/20/2015, 06/08/2016  . Pneumococcal Conjugate-13 08/22/1999  . Td 08/22/1999  . Zoster 08/21/2005   Health Maintenance Due  Topic Date Due  . DEXA SCAN  04/12/1990  . PNA vac Low Risk Adult (2 of 2 - PPSV23) 08/21/2000  . TETANUS/TDAP  08/21/2009    Patient Care Team: Blanchie Serve, MD as PCP - General (Internal Medicine) Melina Modena, [facility] Netta Cedars, MD as Consulting Physician (Orthopedic Surgery) Luberta Mutter, MD as Consulting Physician (Ophthalmology) Sydnee Levans, MD as Consulting Physician (Dermatology) Adrian Prows, MD as Consulting Physician (Cardiology)  Indicate any recent Medical Services you may have received from other than [facility] providers in the past year (date may be approximate).     Assessment:   This is a routine wellness examination for Terri Acosta.  Hearing/Vision screen No exam data present  Dietary issues and exercise activities discussed:    Goals    None     Depression Screen PHQ 2/9 Scores 07/18/2017 07/04/2016 01/04/2016 07/06/2015 03/09/2015 08/12/2013 02/11/2013  PHQ - 2 Score 0 0 0 0 0 0 0  PHQ- 9 Score 0 - - - - - -    Fall Risk Fall Risk  07/04/2016 01/04/2016 12/28/2015 07/06/2015 03/09/2015  Falls in the past year? No No Yes No No  Number falls in past yr: - 1 1 - -  Injury with  Fall? - No No - -    Is the patient's home free of loose throw rugs in walkways, pet beds, electrical cords, etc?   yes      Grab bars in the bathroom? yes      Handrails on the stairs?   yes      Adequate lighting?   yes  Timed Get Up and Go Performed 25 seconds, fall risk  Cognitive Function: MMSE - Mini Mental State Exam 07/04/2016 02/11/2013  Orientation to time 5 5  Orientation to Place 5 5  Registration 3 3  Attention/ Calculation 5 5  Recall 3 1  Language- name 2 objects 2 2  Language- repeat 1 1  Language- follow 3 step command 3 3  Language- read & follow direction 1 1  Write a sentence 1 1  Copy design 1  1  Total score 30 28        Screening Tests Health Maintenance  Topic Date Due  . DEXA SCAN  04/12/1990  . PNA vac Low Risk Adult (2 of 2 - PPSV23) 08/21/2000  . TETANUS/TDAP  08/21/2009  . INFLUENZA VACCINE  Completed    Qualifies for Shingles Vaccine? Yes, educated and declined  Cancer Screenings: Lung: Low Dose CT Chest recommended if Age 110-80 years, 30 pack-year currently smoking OR have quit w/in 15years. Patient does not qualify. Breast: Up to date on Mammogram? Yes   Up to date of Bone Density/Dexa? Yes Colorectal: up to date  Additional Screenings:  Hepatitis B/HIV/Syphillis: declined Hepatitis C Screening: declined     Plan:    I have personally reviewed and addressed the Medicare Annual Wellness questionnaire and have noted the following in the patient's chart:  A. Medical and social history B. Use of alcohol, tobacco or illicit drugs  C. Current medications and supplements D. Functional ability and status E.  Nutritional status F.  Physical activity G. Advance directives H. List of other physicians I.  Hospitalizations, surgeries, and ER visits in previous 12 months J.  [address] to include hearing, vision, cognitive, depression L. Referrals and appointments - none  In addition, I have reviewed and discussed with  patient certain preventive protocols, quality metrics, and best practice recommendations. A written personalized care plan for preventive services as well as general preventive health recommendations were provided to patient.  See attached scanned questionnaire for additional information.   Signed,   Tyson Dense, RN Nurse Health Advisor   Quick Notes   Health Maintenance: tdap, pna 23, and shingrix due and declined. [facility] report states DEXA 08/29/2006     Abnormal Screen: MMSE 28/30, did not pass clock drawing     Patient Concerns: needs her f/u lab appointment for doctor-provider/CMA notified     Nurse Concerns: none

## 2017-09-12 NOTE — Patient Instructions (Signed)
Terri Acosta , Thank you for taking time to come for your Medicare Wellness Visit. I appreciate your ongoing commitment to your health goals. Please review the following plan we discussed and let me know if I can assist you in the future.   Screening recommendations/referrals: Colonoscopy excluded, you are over age 82 Mammogram excluded, you are over age 28 Bone Density up to date Recommended yearly ophthalmology/optometry visit for glaucoma screening and checkup Recommended yearly dental visit for hygiene and checkup  Vaccinations: Influenza vaccine up to date, due 2019 fall season Pneumococcal vaccine 23 due, declined for now Tdap vaccine due, declined for now Shingles vaccine due, declined    Advanced directives: in chart  Conditions/risks identified: none  Next appointment: Dr. Bubba Camp 12/20/2017 @ 10am   Preventive Care 65 Years and Older, Female Preventive care refers to lifestyle choices and visits with your health care provider that can promote health and wellness. What does preventive care include?  A yearly physical exam. This is also called an annual well check.  Dental exams once or twice a year.  Routine eye exams. Ask your health care provider how often you should have your eyes checked.  Personal lifestyle choices, including:  Daily care of your teeth and gums.  Regular physical activity.  Eating a healthy diet.  Avoiding tobacco and drug use.  Limiting alcohol use.  Practicing safe sex.  Taking low-dose aspirin every day.  Taking vitamin and mineral supplements as recommended by your health care provider. What happens during an annual well check? The services and screenings done by your health care provider during your annual well check will depend on your age, overall health, lifestyle risk factors, and family history of disease. Counseling  Your health care provider may ask you questions about your:  Alcohol use.  Tobacco use.  Drug  use.  Emotional well-being.  Home and relationship well-being.  Sexual activity.  Eating habits.  History of falls.  Memory and ability to understand (cognition).  Work and work Statistician.  Reproductive health. Screening  You may have the following tests or measurements:  Height, weight, and BMI.  Blood pressure.  Lipid and cholesterol levels. These may be checked every 5 years, or more frequently if you are over 76 years old.  Skin check.  Lung cancer screening. You may have this screening every year starting at age 52 if you have a 30-pack-year history of smoking and currently smoke or have quit within the past 15 years.  Fecal occult blood test (FOBT) of the stool. You may have this test every year starting at age 38.  Flexible sigmoidoscopy or colonoscopy. You may have a sigmoidoscopy every 5 years or a colonoscopy every 10 years starting at age 74.  Hepatitis C blood test.  Hepatitis B blood test.  Sexually transmitted disease (STD) testing.  Diabetes screening. This is done by checking your blood sugar (glucose) after you have not eaten for a while (fasting). You may have this done every 1-3 years.  Bone density scan. This is done to screen for osteoporosis. You may have this done starting at age 10.  Mammogram. This may be done every 1-2 years. Talk to your health care provider about how often you should have regular mammograms. Talk with your health care provider about your test results, treatment options, and if necessary, the need for more tests. Vaccines  Your health care provider may recommend certain vaccines, such as:  Influenza vaccine. This is recommended every year.  Tetanus, diphtheria, and  acellular pertussis (Tdap, Td) vaccine. You may need a Td booster every 10 years.  Zoster vaccine. You may need this after age 58.  Pneumococcal 13-valent conjugate (PCV13) vaccine. One dose is recommended after age 59.  Pneumococcal polysaccharide  (PPSV23) vaccine. One dose is recommended after age 13. Talk to your health care provider about which screenings and vaccines you need and how often you need them. This information is not intended to replace advice given to you by your health care provider. Make sure you discuss any questions you have with your health care provider. Document Released: 09/03/2015 Document Revised: 04/26/2016 Document Reviewed: 06/08/2015 Elsevier Interactive Patient Education  2017 [place] Prevention in the Home Falls can cause injuries. They can happen to people of all ages. There are many things you can do to make your home safe and to help prevent falls. What can I do on the outside of my home?  Regularly fix the edges of walkways and driveways and fix any cracks.  Remove anything that might make you trip as you walk through a door, such as a raised step or threshold.  Trim any bushes or trees on the path to your home.  Use bright outdoor lighting.  Clear any walking paths of anything that might make someone trip, such as rocks or tools.  Regularly check to see if handrails are loose or broken. Make sure that both sides of any steps have handrails.  Any raised decks and porches should have guardrails on the edges.  Have any leaves, snow, or ice cleared regularly.  Use sand or salt on walking paths during winter.  Clean up any spills in your garage right away. This includes oil or grease spills. What can I do in the bathroom?  Use night lights.  Install grab bars by the toilet and in the tub and shower. Do not use towel bars as grab bars.  Use non-skid mats or decals in the tub or shower.  If you need to sit down in the shower, use a plastic, non-slip stool.  Keep the floor dry. Clean up any water that spills on the floor as soon as it happens.  Remove soap buildup in the tub or shower regularly.  Attach bath mats securely with double-sided non-slip rug tape.  Do not have  throw rugs and other things on the floor that can make you trip. What can I do in the bedroom?  Use night lights.  Make sure that you have a light by your bed that is easy to reach.  Do not use any sheets or blankets that are too big for your bed. They should not hang down onto the floor.  Have a firm chair that has side arms. You can use this for support while you get dressed.  Do not have throw rugs and other things on the floor that can make you trip. What can I do in the kitchen?  Clean up any spills right away.  Avoid walking on wet floors.  Keep items that you use a lot in easy-to-reach places.  If you need to reach something above you, use a strong step stool that has a grab bar.  Keep electrical cords out of the way.  Do not use floor polish or wax that makes floors slippery. If you must use wax, use non-skid floor wax.  Do not have throw rugs and other things on the floor that can make you trip. What can I do with my stairs?  Do not leave any items on the stairs.  Make sure that there are handrails on both sides of the stairs and use them. Fix handrails that are broken or loose. Make sure that handrails are as long as the stairways.  Check any carpeting to make sure that it is firmly attached to the stairs. Fix any carpet that is loose or worn.  Avoid having throw rugs at the top or bottom of the stairs. If you do have throw rugs, attach them to the floor with carpet tape.  Make sure that you have a light switch at the top of the stairs and the bottom of the stairs. If you do not have them, ask someone to add them for you. What else can I do to help prevent falls?  Wear shoes that:  Do not have high heels.  Have rubber bottoms.  Are comfortable and fit you well.  Are closed at the toe. Do not wear sandals.  If you use a stepladder:  Make sure that it is fully opened. Do not climb a closed stepladder.  Make sure that both sides of the stepladder are  locked into place.  Ask someone to hold it for you, if possible.  Clearly mark and make sure that you can see:  Any grab bars or handrails.  First and last steps.  Where the edge of each step is.  Use tools that help you move around (mobility aids) if they are needed. These include:  Canes.  Walkers.  Scooters.  Crutches.  Turn on the lights when you go into a dark area. Replace any light bulbs as soon as they burn out.  Set up your furniture so you have a clear path. Avoid moving your furniture around.  If any of your floors are uneven, fix them.  If there are any pets around you, be aware of where they are.  Review your medicines with your doctor. Some medicines can make you feel dizzy. This can increase your chance of falling. Ask your doctor what other things that you can do to help prevent falls. This information is not intended to replace advice given to you by your health care provider. Make sure you discuss any questions you have with your health care provider. Document Released: 06/03/2009 Document Revised: 01/13/2016 Document Reviewed: 09/11/2014 Elsevier Interactive Patient Education  2017 [employer].

## 2017-10-05 ENCOUNTER — Encounter: Payer: Self-pay | Admitting: Internal Medicine

## 2017-12-01 ENCOUNTER — Other Ambulatory Visit: Payer: Self-pay | Admitting: Internal Medicine

## 2018-01-16 ENCOUNTER — Encounter: Payer: Self-pay | Admitting: Internal Medicine

## 2018-01-16 ENCOUNTER — Non-Acute Institutional Stay: Payer: Medicare Other | Admitting: Internal Medicine

## 2018-01-16 VITALS — BP 130/64 | HR 70 | Temp 97.3°F | Resp 18 | Ht 62.0 in | Wt 113.4 lb

## 2018-01-16 DIAGNOSIS — E785 Hyperlipidemia, unspecified: Secondary | ICD-10-CM | POA: Diagnosis not present

## 2018-01-16 DIAGNOSIS — I10 Essential (primary) hypertension: Secondary | ICD-10-CM

## 2018-01-16 DIAGNOSIS — M159 Polyosteoarthritis, unspecified: Secondary | ICD-10-CM

## 2018-01-16 DIAGNOSIS — J449 Chronic obstructive pulmonary disease, unspecified: Secondary | ICD-10-CM

## 2018-01-16 DIAGNOSIS — R002 Palpitations: Secondary | ICD-10-CM

## 2018-01-16 DIAGNOSIS — Z23 Encounter for immunization: Secondary | ICD-10-CM | POA: Diagnosis not present

## 2018-01-16 DIAGNOSIS — M150 Primary generalized (osteo)arthritis: Secondary | ICD-10-CM | POA: Diagnosis not present

## 2018-01-16 DIAGNOSIS — R2681 Unsteadiness on feet: Secondary | ICD-10-CM | POA: Diagnosis not present

## 2018-01-16 DIAGNOSIS — Z0000 Encounter for general adult medical examination without abnormal findings: Secondary | ICD-10-CM

## 2018-01-16 MED ORDER — TETANUS-DIPHTH-ACELL PERTUSSIS 5-2-15.5 LF-MCG/0.5 IM SUSP: 0.5000 mL | Freq: Once | INTRAMUSCULAR | 0 refills | Status: AC

## 2018-01-16 MED ORDER — PNEUMOCOCCAL VAC POLYVALENT 25 MCG/0.5ML IJ INJ: 0.5000 mL | INJECTION | INTRAMUSCULAR | 0 refills | Status: AC

## 2018-01-16 NOTE — Progress Notes (Signed)
[facility]  Provider: Blanchie Serve MD   Location:  [place] of Service:  Clinic (12)  PCP: Blanchie Serve, MD Patient Care Team: Blanchie Serve, MD as PCP - General (Internal Medicine) [place], [facility] Netta Cedars, MD as Consulting Physician (Orthopedic Surgery) Luberta Mutter, MD as Consulting Physician (Ophthalmology) Sydnee Levans, MD as Consulting Physician (Dermatology) Adrian Prows, MD as Consulting Physician (Cardiology)  Extended Emergency Contact Information Primary Emergency Contact: Wilhemena Durie [place] of [place] Phone: [phone] Mobile Phone: [phone] Relation: Daughter   Goals of Care: Advanced Directive information Advanced Directives 01/16/2018  Does Patient Have a Medical Advance Directive? Yes  Type of Paramedic of [place];Living will  Does patient want to make changes to medical advance directive? No - Patient declined  Copy of [place] in Chart? Yes      Chief Complaint  Patient presents with  . Annual Exam    No acute concerns at this time.   . Medication Refill    No refills needed at this time   . MMSE    30/30. Passed clock drawing   . Health Maintenance    Patient is due foor Pneumoccal and TDAP    HPI: Patient is a 84 y.o. female seen today for annual exam. She is due for pneumococcal and tdap vaccine. She takes metoprolol tartrate 50 mg bid for her palpitations and hypertension. She also takes hyzaar for her blood pressure. Reading appears controlled in today's clinic visit. Symptom free this visit. She is on naproxen 250 mg twice a day for joint pain. She has history of osteoarthritis of knee and hip.she continues to use a walker for ambulation, no fall reported.    Past Medical History:  Diagnosis Date  . Actinic keratosis 02/13/2012  . Acute bronchospasm 10/31/2011  . Cancer ([place]) 04/2008   Skin cancer of low back  [name], MD  . Cataract   . Disorder of bone and cartilage, unspecified 02/18/2007  . Dizziness and giddiness 08/30/2010  . Edema 12/19/2011  . 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 Osteoarthrosis, unspecified whether generalized or localized, unspecified site 08/20/2006  . Other abnormal blood chemistry 03/14/2011  . Other and unspecified hyperlipidemia 10/18/2010  . Palpitations 02/13/2012  . Postmenopausal bleeding 08/30/2010  . Shortness of breath 11/28/2011  . Supraventricular premature beats 05/23/2011  . Unspecified essential hypertension 08/20/2006   Past Surgical History:  Procedure Laterality Date  . CARPAL TUNNEL RELEASE  2004   S. Norris MD  . CATARACT EXTRACTION W/ INTRAOCULAR LENS IMPLANT Right 2010  . CATARACT EXTRACTION W/ INTRAOCULAR LENS IMPLANT Left 10/2012  . CESAREAN SECTION  [phone]   x3  . EYE SURGERY Right 07/2009   cataract extraction/IOLI [name], MD  . KNEE ARTHROSCOPY Right 2008   Torn meniscus, Dr. Veverly Fells    reports that she quit smoking about 39 years ago. She has never used smokeless tobacco. She reports that she drinks about 4.8 oz of alcohol per week. She reports that she does not use drugs. Social History   Socioeconomic History  . Marital status: Widowed    Spouse name: Not on file  . Number of children: Not on file  . Years of education: Not on file  . Highest education level: Not on file  Occupational History  . Not on file  Social Needs  . Financial resource strain: Not hard at all  . Food insecurity:    Worry: Never  true    Inability: Never true  . Transportation needs:    Medical: Yes    Non-medical: No  Tobacco Use  . Smoking status: Former Smoker    Last attempt to quit: 02/11/1978    Years since quitting: 39.9  . Smokeless tobacco: Never Used  Substance and Sexual Activity  . Alcohol use: Yes    Alcohol/week: 4.8 oz    Types: 1 Glasses of wine, 7 Shots of liquor per week    Comment: i shot nightly  . Drug  use: No  . Sexual activity: Never  Lifestyle  . Physical activity:    Days per week: 0 days    Minutes per session: 0 min  . Stress: Not at all  Relationships  . Social connections:    Talks on phone: More than three times a week    Gets together: More than three times a week    Attends religious service: Never    Active member of club or organization: No    Attends meetings of clubs or organizations: Never    Relationship status: Widowed  . Intimate partner violence:    Fear of current or ex partner: No    Emotionally abused: No    Physically abused: No    Forced sexual activity: No  Other Topics Concern  . Not on file  Social History Narrative   Lives alone in a apartment at [facility] in the independent living area since 2007   The patient is not exercising regularly, walking with walker   No specific diet.   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   She has a living will, POA   Former smoker, stopped 1979   Alcohol - one glass of wine at night    Functional Status Survey:    Family History  Problem Relation Age of Onset  . Diabetes Brother   . COPD Brother     Health Maintenance  Topic Date Due  . DEXA SCAN  04/12/1990  . PNA vac Low Risk Adult (2 of 2 - PPSV23) 08/21/2000  . TETANUS/TDAP  08/21/2009  . INFLUENZA VACCINE  03/21/2018    Allergies  Allergen Reactions  . Lomotil [Diphenoxylate] Nausea And Vomiting  . Sulfa Antibiotics   . Amlodipine Swelling    Outpatient Encounter Medications as of 01/16/2018  Medication Sig  . aspirin EC 81 MG tablet Take 1 tablet (81 mg total) by mouth daily.  [name] losartan-hydrochlorothiazide (HYZAAR) 100-25 MG tablet TAKE 1 TABLET DAILY TO CONTROL BLOOD PRESSURE  . metoprolol tartrate (LOPRESSOR) 50 MG tablet TAKE 1 TABLET TWICE A DAY TO HELP HEART RHYTHM AND BLOOD PRESSURE  . naproxen (NAPROSYN) 250 MG tablet Take 250 mg by mouth 2 (two) times daily with a meal.   No facility-administered encounter  medications on file as of 01/16/2018.     Review of Systems  Constitutional: Negative for appetite change, chills, diaphoresis, fatigue and fever.  HENT: Positive for hearing loss. Negative for congestion, ear discharge, ear pain, mouth sores, postnasal drip, rhinorrhea, sinus pressure, sore throat and trouble swallowing.   Eyes: Positive for visual disturbance. Negative for pain, redness and itching.       Wears corrective lenses, has macular degeneration and sees ophthalmology every 6 months  Respiratory: Negative for cough, choking and shortness of breath.   Cardiovascular: Positive for palpitations. Negative for chest pain and leg swelling.       Has noticed swelling to her legs at times towards end  of day, improves in morning  Gastrointestinal: Negative for abdominal pain, constipation, diarrhea, nausea and vomiting.  Genitourinary: Positive for frequency. Negative for dysuria, flank pain, hematuria and vaginal discharge.  Musculoskeletal: Positive for arthralgias and gait problem. Negative for back pain.       Uses walker for ambulation, no fall reported, knee joint bothers her and naproxen helps.   Skin: Negative for rash and wound.  Neurological: Negative for dizziness, weakness, light-headedness, numbness and headaches.  Hematological: Bruises/bleeds easily.  Psychiatric/Behavioral: Negative for behavioral problems, confusion, hallucinations, self-injury and suicidal ideas. The patient is not nervous/anxious.     Vitals:   01/16/18 1003  BP: 130/64  Pulse: 70  Resp: 18  Temp: (!) 97.3 F (36.3 C)  TempSrc: Oral  SpO2: 97%  Weight: 113 lb 6.4 oz (51.4 kg)  Height: _0  (1.575 m)   Body mass index is 20.74 kg/m.   Wt Readings from Last 3 Encounters:  01/16/18 113 lb 6.4 oz (51.4 kg)  09/12/17 114 lb (51.7 kg)  07/18/17 114 lb (51.7 kg)   Physical Exam  Constitutional: She is oriented to person, place, and time. She appears well-developed and well-nourished. No  distress.  HENT:  Head: Normocephalic and atraumatic.  Right Ear: External ear normal.  Nose: Nose normal.  Mouth/Throat: Oropharynx is clear and moist. No oropharyngeal exudate.  Cerumen noted in left external ear  Eyes: Pupils are equal, round, and reactive to light. Conjunctivae and EOM are normal. Right eye exhibits no discharge. Left eye exhibits no discharge. No scleral icterus.  Neck: Normal range of motion. Neck supple. No thyromegaly present.  Cardiovascular: Normal rate, regular rhythm, normal heart sounds and intact distal pulses.  Pulmonary/Chest: Effort normal and breath sounds normal. No respiratory distress.  Abdominal: Soft. Bowel sounds are normal. She exhibits no mass. There is no tenderness. There is no rebound and no guarding.  Musculoskeletal: Normal range of motion. She exhibits no edema.  Able to move all 4 extremities, normal muscle tone and strength, unsteady gait, uses walker  Lymphadenopathy:    She has no cervical adenopathy.  Neurological: She is alert and oriented to person, place, and time. She displays normal reflexes. No cranial nerve deficit or sensory deficit. She exhibits normal muscle tone.  01/16/18 MMSE 30/30, passed clock draw  Skin: Skin is warm and dry. She is not diaphoretic. No erythema.  Psychiatric: She has a normal mood and affect. Her behavior is normal.    Labs reviewed: Basic Metabolic Panel: Recent Labs    07/26/17 0000  NA 136  K 3.7  CL 95*  CO2 32  GLUCOSE 93  BUN 15  CREATININE 0.48*  CALCIUM 9.3   Liver Function Tests: Recent Labs    07/26/17 0000  AST 15  ALT 7  BILITOT 0.6  PROT 6.0*   No results for input(s): LIPASE, AMYLASE in the last 8760 hours. No results for input(s): AMMONIA in the last 8760 hours. CBC: Recent Labs    07/26/17 0000  WBC 7.2  NEUTROABS 4,802  HGB 12.1  HCT 36.4  MCV 83.7  PLT 257   Cardiac Enzymes: No results for input(s): CKTOTAL, CKMB, CKMBINDEX, TROPONINI in the last 8760  hours. BNP: Invalid input(s): POCBNP No results found for: HGBA1C Lab Results  Component Value Date   TSH 2.28 07/26/2017   No results found for: VITAMINB12 No results found for: FOLATE No results found for: IRON, TIBC, FERRITIN  Lipid Panel: Recent Labs    07/26/17 0000  CHOL 178  HDL 81  LDLCALC 84  TRIG 58  CHOLHDL 2.2   No results found for: HGBA1C  Procedures since last visit: No results found.   01/16/18 ekg- sinus rhythm with occasional PVCs, HR 61 bpm, no new changes compared to EKG 11/03/10  Assessment/Plan  1. Essential hypertension Controlled BP, continue metoprolol and hyzaar current regimen. Labs as below. Reviewed labs from 07/2017 - CMP with eGFR([facility]); Future - CMP with eGFR([facility]); Future - CBC (no diff); Future  2. Chronic obstructive pulmonary disease, unspecified COPD type ([place]) Smoker in past. Denies dyspnea or wheezing. No cough reported. Monitor clinically.  - CMP with eGFR([facility]); Future - CBC (no diff); Future  3. Immunization due - pneumococcal 23 valent vaccine (PNU-IMMUNE) 25 MCG/0.5ML injection; Inject 0.5 mLs into the muscle tomorrow at 10 am for 1 dose.  Dispense: 0.5 mL; Refill: 0 - Tdap (ADACEL) 12-20-13.5 LF-MCG/0.5 injection; Inject 0.5 mLs into the muscle once for 1 dose.  Dispense: 0.5 mL; Refill: 0  4. Hyperlipidemia, unspecified hyperlipidemia type - Lipid Panel; Future - CBC (no diff); Future - Lipid Panel; Future  5. Unstable gait Fall precautions, continue to use walker for ambulation  6. Primary osteoarthritis involving multiple joints Continue naproxen bid and monitor renal function  7. Palpitations EKG reviewed, sinus rhythm with occasional PVCs and HR 60/min, sinus rhythm. Continue metoprolol.  - TSH; Future  8. Annual physical exam uptodate with influenza and pneumococcal 13 vaccine. Due for tdap and pneumococcal 23, script sent to pharmacy. the patient was counseled regarding prevention of dental and  periodontal disease, diet, regular sustained exercise for at least 30 minutes 5 times per week, the proper use of sunscreen and protective clothing, recommended schedule for eye exam, cholesterol, thyroid and diabetes screening.   Labs/tests ordered:   Lab Orders     CMP with eGFR([facility])     CBC (no diff)     Lipid Panel     TSH     CMP with eGFR([facility])     CBC (no diff)     Lipid Panel   Next appointment: 6 months  Communication: reviewed care plan with patient     Blanchie Serve, MD Internal Medicine [address] [place], [place] [address] Cell Phone (Monday-Friday 8 am - 5 pm): [phone] On Call: [phone] and follow prompts after 5 pm and on weekends Office Phone: [phone] Office Fax: [phone]

## 2018-01-23 ENCOUNTER — Encounter: Payer: Self-pay | Admitting: Internal Medicine

## 2018-01-24 ENCOUNTER — Other Ambulatory Visit: Payer: Medicare Other

## 2018-01-24 DIAGNOSIS — E785 Hyperlipidemia, unspecified: Secondary | ICD-10-CM

## 2018-01-24 DIAGNOSIS — R002 Palpitations: Secondary | ICD-10-CM

## 2018-01-24 DIAGNOSIS — J449 Chronic obstructive pulmonary disease, unspecified: Secondary | ICD-10-CM

## 2018-01-24 DIAGNOSIS — I10 Essential (primary) hypertension: Secondary | ICD-10-CM

## 2018-01-24 LAB — LIPID PANEL
CHOLESTEROL: 205 mg/dL — AB (ref <200)
HDL: 81 mg/dL (ref >50)
LDL Cholesterol (Calc): 109 mg/dL (calc) — ABNORMAL HIGH
Non-HDL Cholesterol (Calc): 124 mg/dL (calc) (ref <130)
TRIGLYCERIDES: 65 mg/dL (ref <150)
Total CHOL/HDL Ratio: 2.5 (calc) (ref <5.0)

## 2018-01-24 LAB — CBC
HCT: 37.6 % (ref 35.0–45.0)
HEMOGLOBIN: 12.7 g/dL (ref 11.7–15.5)
MCH: 28.1 pg (ref 27.0–33.0)
MCHC: 33.8 g/dL (ref 32.0–36.0)
MCV: 83.2 fL (ref 80.0–100.0)
MPV: 10.5 fL (ref 7.5–12.5)
Platelets: 279 10*3/uL (ref 140–400)
RBC: 4.52 10*6/uL (ref 3.80–5.10)
RDW: 13.3 % (ref 11.0–15.0)
WBC: 7.9 10*3/uL (ref 3.8–10.8)

## 2018-01-24 LAB — COMPLETE METABOLIC PANEL WITH GFR
AG RATIO: 2 (calc) (ref 1.0–2.5)
ALBUMIN MSPROF: 4.3 g/dL (ref 3.6–5.1)
ALT: 6 U/L (ref 6–29)
AST: 15 U/L (ref 10–35)
Alkaline phosphatase (APISO): 55 U/L (ref 33–130)
BUN / CREAT RATIO: 33 (calc) — AB (ref 6–22)
BUN: 18 mg/dL (ref 7–25)
CALCIUM: 9.5 mg/dL (ref 8.6–10.4)
CO2: 31 mmol/L (ref 20–32)
CREATININE: 0.54 mg/dL — AB (ref 0.60–0.88)
Chloride: 91 mmol/L — ABNORMAL LOW (ref 98–110)
GFR, EST AFRICAN AMERICAN: 95 mL/min/{1.73_m2} (ref >=60)
GFR, EST NON AFRICAN AMERICAN: 82 mL/min/{1.73_m2} (ref >=60)
GLOBULIN: 2.2 g/dL (ref 1.9–3.7)
Glucose, Bld: 93 mg/dL (ref 65–99)
Potassium: 3.5 mmol/L (ref 3.5–5.3)
Sodium: 132 mmol/L — ABNORMAL LOW (ref 135–146)
TOTAL PROTEIN: 6.5 g/dL (ref 6.1–8.1)
Total Bilirubin: 0.6 mg/dL (ref 0.2–1.2)

## 2018-01-24 LAB — TSH: TSH: 2.38 m[IU]/L (ref 0.40–4.50)

## 2018-02-06 ENCOUNTER — Encounter: Payer: Medicare Other | Admitting: Internal Medicine

## 2018-02-25 ENCOUNTER — Telehealth: Payer: Self-pay

## 2018-02-25 NOTE — Telephone Encounter (Signed)
Is patient using electric wheelchair or scooter? If yes, how long has she been using it for. She will need an evaluation in order to use this. My last note says, she is using walker to ambulate. [place], call pt for further clarification and if she needs to use this in facility and needs a note to use this, I will need to evaluate her.

## 2018-02-25 NOTE — Telephone Encounter (Signed)
Patient called clinical intake indicating that she has an electric cart and to abide by the rules at [place] needs to provide a statement to rehab informing them of her need for this electric cart.  Patient aware I will forward message to Dr.Pandey and her assistant Brittany/RMA  Last OV 01/16/18, pending OV 07/17/18  Please advise

## 2018-02-26 NOTE — Telephone Encounter (Signed)
Spoke with the patient and she stated that she does have a motorized wheelchair she just needs to be cleared by a doctor in order for her use it at [facility]. She stated that she is currently using a walker. Patient has been provided with a clinic appointment for tomorrow 02/26/18 at 9:30 am.

## 2018-02-27 ENCOUNTER — Non-Acute Institutional Stay: Payer: Medicare Other | Admitting: Internal Medicine

## 2018-02-27 ENCOUNTER — Encounter: Payer: Self-pay | Admitting: Internal Medicine

## 2018-02-27 VITALS — BP 124/64 | HR 63 | Temp 97.7°F | Resp 18 | Ht 62.0 in | Wt 114.2 lb

## 2018-02-27 DIAGNOSIS — R2681 Unsteadiness on feet: Secondary | ICD-10-CM

## 2018-02-27 DIAGNOSIS — M1612 Unilateral primary osteoarthritis, left hip: Secondary | ICD-10-CM

## 2018-02-27 DIAGNOSIS — M1711 Unilateral primary osteoarthritis, right knee: Secondary | ICD-10-CM

## 2018-02-27 NOTE — Patient Instructions (Signed)
Consult with Physical and occupational therapy team has been placed.

## 2018-02-27 NOTE — Progress Notes (Signed)
[facility]  Provider: Blanchie Serve MD   Location:  [place] of Service:  Clinic (12)  PCP: Blanchie Serve, MD Patient Care Team: Blanchie Serve, MD as PCP - General (Internal Medicine) [place], [facility] Netta Cedars, MD as Consulting Physician (Orthopedic Surgery) Luberta Mutter, MD as Consulting Physician (Ophthalmology) Sydnee Levans, MD as Consulting Physician (Dermatology) Adrian Prows, MD as Consulting Physician (Cardiology)  Extended Emergency Contact Information Primary Emergency Contact: Wilhemena Durie [place] of [place] Phone: [phone] Mobile Phone: [phone] Relation: Daughter   Goals of Care: Advanced Directive information Advanced Directives 02/27/2018  Does Patient Have a Medical Advance Directive? Yes  Type of Paramedic of [place];Living will  Does patient want to make changes to medical advance directive? No - Patient declined  Copy of [place] in Chart? Yes      Chief Complaint  Patient presents with  . Acute Visit    Patient needs to be cleared to use a motorized wheelchair around the facility.    HPI: Patient is a 84 y.o. female seen today for acute visit. She would like an evaluation for motorized wheelchair to use to get around the facility. Her left knee has severe osteoarthritis. She also has history of dislocated left hip.she is not a surgical candidate. She has medical history of COPD, not on any medication and denies dyspnea. Her mobility is limited due to pain and has poor balance. Denies any fall. Has been ambulating with her walker. Ambulation gets difficult for longer distance. Patient has purchased motorized wheelchair by herself and would like evaluation from therapy team and education on using it safely.   Past Medical History:  Diagnosis Date  . Actinic keratosis 02/13/2012  . Acute bronchospasm 10/31/2011  . Cancer ([place])  04/2008   Skin cancer of low back [name], MD  . Cataract   . Disorder of bone and cartilage, unspecified 02/18/2007  . Dizziness and giddiness 08/30/2010  . Edema 12/19/2011  . 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 Osteoarthrosis, unspecified whether generalized or localized, unspecified site 08/20/2006  . Other abnormal blood chemistry 03/14/2011  . Other and unspecified hyperlipidemia 10/18/2010  . Palpitations 02/13/2012  . Postmenopausal bleeding 08/30/2010  . Shortness of breath 11/28/2011  . Supraventricular premature beats 05/23/2011  . Unspecified essential hypertension 08/20/2006   Past Surgical History:  Procedure Laterality Date  . CARPAL TUNNEL RELEASE  2004   S. Norris MD  . CATARACT EXTRACTION W/ INTRAOCULAR LENS IMPLANT Right 2010  . CATARACT EXTRACTION W/ INTRAOCULAR LENS IMPLANT Left 10/2012  . CESAREAN SECTION  [phone]   x3  . EYE SURGERY Right 07/2009   cataract extraction/IOLI [name], MD  . KNEE ARTHROSCOPY Right 2008   Torn meniscus, Dr. Veverly Fells    reports that she quit smoking about 40 years ago. She has never used smokeless tobacco. She reports that she drinks about 4.8 oz of alcohol per week. She reports that she does not use drugs. Social History   Socioeconomic History  . Marital status: Widowed    Spouse name: Not on file  . Number of children: Not on file  . Years of education: Not on file  . Highest education level: Not on file  Occupational History  . Not on file  Social Needs  . Financial resource strain: Not hard at all  . Food insecurity:    Worry: Never true    Inability: Never true  . Transportation  needs:    Medical: Yes    Non-medical: No  Tobacco Use  . Smoking status: Former Smoker    Last attempt to quit: 02/11/1978    Years since quitting: 40.0  . Smokeless tobacco: Never Used  Substance and Sexual Activity  . Alcohol use: Yes    Alcohol/week: 4.8 oz    Types: 1 Glasses of wine, 7 Shots of liquor per week     Comment: i shot nightly  . Drug use: No  . Sexual activity: Never  Lifestyle  . Physical activity:    Days per week: 0 days    Minutes per session: 0 min  . Stress: Not at all  Relationships  . Social connections:    Talks on phone: More than three times a week    Gets together: More than three times a week    Attends religious service: Never    Active member of club or organization: No    Attends meetings of clubs or organizations: Never    Relationship status: Widowed  . Intimate partner violence:    Fear of current or ex partner: No    Emotionally abused: No    Physically abused: No    Forced sexual activity: No  Other Topics Concern  . Not on file  Social History Narrative   Lives alone in a apartment at [facility] in the independent living area since 2007   The patient is not exercising regularly, walking with walker   No specific diet.   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   She has a living will, POA   Former smoker, stopped 1979   Alcohol - one glass of wine at night     Family History  Problem Relation Age of Onset  . Diabetes Brother   . COPD Brother     Health Maintenance  Topic Date Due  . DEXA SCAN  04/12/1990  . PNA vac Low Risk Adult (2 of 2 - PPSV23) 08/21/2000  . TETANUS/TDAP  08/21/2009  . INFLUENZA VACCINE  03/21/2018    Allergies  Allergen Reactions  . Lomotil [Diphenoxylate] Nausea And Vomiting  . Sulfa Antibiotics   . Amlodipine Swelling    Outpatient Encounter Medications as of 02/27/2018  Medication Sig  . aspirin EC 81 MG tablet Take 1 tablet (81 mg total) by mouth daily.  [name] losartan-hydrochlorothiazide (HYZAAR) 100-25 MG tablet TAKE 1 TABLET DAILY TO CONTROL BLOOD PRESSURE  . metoprolol tartrate (LOPRESSOR) 50 MG tablet TAKE 1 TABLET TWICE A DAY TO HELP HEART RHYTHM AND BLOOD PRESSURE  . naproxen (NAPROSYN) 250 MG tablet Take 250 mg by mouth 2 (two) times daily with a meal.   No facility-administered encounter  medications on file as of 02/27/2018.     Review of Systems  Constitutional: Negative for appetite change and fatigue.  Eyes: Negative for visual disturbance.  Respiratory: Negative for cough and shortness of breath.   Cardiovascular: Negative for chest pain and palpitations.  Musculoskeletal: Positive for arthralgias and gait problem. Negative for back pain.  Neurological: Negative for weakness, numbness and headaches.  Psychiatric/Behavioral: Negative for behavioral problems.    Vitals:   02/27/18 0927  BP: 124/64  Pulse: 63  Resp: 18  Temp: 97.7 F (36.5 C)  TempSrc: Oral  SpO2: 99%  Weight: 114 lb 3.2 oz (51.8 kg)  Height: 5\' 2"  (1.575 m)   Body mass index is 20.89 kg/m. Physical Exam  Constitutional: She is oriented to person, place, and time.  Thin built elderly female in no acute distress  HENT:  Head: Normocephalic and atraumatic.  Cardiovascular: Normal rate and regular rhythm.  Murmur heard. Pulmonary/Chest: Effort normal and breath sounds normal.  Musculoskeletal: She exhibits no edema.  Right knee swollen (chronic), no tenderness or redness, normal temperature, crepitus with ROM, left hip limited ROM. Unsteady gait, uses walker for ambulation. Good strength to all 4 extremities. Kyphosis present  Neurological: She is alert and oriented to person, place, and time. She exhibits normal muscle tone.  Skin: Skin is warm and dry.  Psychiatric: She has a normal mood and affect.    Labs reviewed: Basic Metabolic Panel: Recent Labs    07/26/17 0000 01/24/18 0820  NA 136 132*  K 3.7 3.5  CL 95* 91*  CO2 32 31  GLUCOSE 93 93  BUN 15 18  CREATININE 0.48* 0.54*  CALCIUM 9.3 9.5   Liver Function Tests: Recent Labs    07/26/17 0000 01/24/18 0820  AST 15 15  ALT 7 6  BILITOT 0.6 0.6  PROT 6.0* 6.5   No results for input(s): LIPASE, AMYLASE in the last 8760 hours. No results for input(s): AMMONIA in the last 8760 hours. CBC: Recent Labs     07/26/17 0000 01/24/18 0820  WBC 7.2 7.9  NEUTROABS 4,802  --   HGB 12.1 12.7  HCT 36.4 37.6  MCV 83.7 83.2  PLT 257 279   Cardiac Enzymes: No results for input(s): CKTOTAL, CKMB, CKMBINDEX, TROPONINI in the last 8760 hours. BNP: Invalid input(s): POCBNP No results found for: HGBA1C Lab Results  Component Value Date   TSH 2.38 01/24/2018   No results found for: VITAMINB12 No results found for: FOLATE No results found for: IRON, TIBC, FERRITIN  Lipid Panel: Recent Labs    07/26/17 0000 01/24/18 0820  CHOL 178 205*  HDL 81 81  LDLCALC 84 109*  TRIG 58 65  CHOLHDL 2.2 2.5   No results found for: HGBA1C  Procedures since last visit: No results found.  Assessment/Plan  1. Unsteady gait Using walker for ambulation at present. She has trouble ambulating long distance with walker due to pain from her OA. Pt has purchased a motorized wheelchair. Will obtain PT and OT consult to help her use motorized wheelchair for long distance mobility.   2. Osteoarthritis of right knee, unspecified osteoarthritis type Continue naproxen, PT and OT consult  3. Primary osteoarthritis of left hip Continue naproxen, walker for ambulation, assess mobility with motorized wheelchair, fall precautions. PT/OT consult    Labs/tests ordered:  None  Next appointment: has follow up  Communication: reviewed care plan with patient     Blanchie Serve, MD Internal Medicine [place], [place] [address] Cell Phone (Monday-Friday 8 am - 5 pm): [phone] On Call: [phone] and follow prompts after 5 pm and on weekends Office Phone: [phone] Office Fax: [phone]

## 2018-04-03 ENCOUNTER — Encounter: Payer: Self-pay | Admitting: Internal Medicine

## 2018-05-07 ENCOUNTER — Other Ambulatory Visit: Payer: Self-pay | Admitting: Internal Medicine

## 2018-05-16 ENCOUNTER — Telehealth: Payer: Self-pay

## 2018-05-16 NOTE — Telephone Encounter (Signed)
Patient called requesting an appointment at [facility]. Patient aware there isn't a clinic schedule at [facility] today.  Patient c/o no bowel movement since Monday, large amounts of mucous discharge from rectum (onset yesterday) and a little gas. Patient thinks she may have a bowel blockage.  Patient aware message will be sent to Dr.Pandey and she or her assistant will follow-up with her.  Patient denies bloody discharge, fever, abdominal pain or discomfort  Please advise

## 2018-05-16 NOTE — Telephone Encounter (Signed)
Patient needs to be evaluated in clinic. Have her be seen at [name] if there is an opening. If not, she will need to be seen in urgent care.

## 2018-05-16 NOTE — Telephone Encounter (Signed)
I spoke with patient and she stated that she could not go to the office for an appointment. Patient was then informed that Dr. Bubba Acosta recommended that she go to an Urgent Care and she stated that she would find someone to drive her there.

## 2018-05-17 ENCOUNTER — Other Ambulatory Visit: Payer: Self-pay

## 2018-05-17 DIAGNOSIS — I10 Essential (primary) hypertension: Secondary | ICD-10-CM

## 2018-05-17 DIAGNOSIS — E785 Hyperlipidemia, unspecified: Secondary | ICD-10-CM

## 2018-05-31 ENCOUNTER — Other Ambulatory Visit: Payer: Self-pay | Admitting: *Deleted

## 2018-05-31 MED ORDER — METOPROLOL TARTRATE 50 MG PO TABS: ORAL_TABLET | ORAL | 1 refills | Status: DC

## 2018-05-31 NOTE — Telephone Encounter (Signed)
[facility]

## 2018-06-04 ENCOUNTER — Non-Acute Institutional Stay: Payer: Medicare Other | Admitting: Family

## 2018-06-04 ENCOUNTER — Encounter: Payer: Self-pay | Admitting: Family

## 2018-06-04 VITALS — BP 130/64 | HR 71 | Temp 98.0°F | Resp 18 | Ht 62.0 in | Wt 108.2 lb

## 2018-06-04 DIAGNOSIS — N939 Abnormal uterine and vaginal bleeding, unspecified: Secondary | ICD-10-CM | POA: Diagnosis not present

## 2018-06-04 DIAGNOSIS — R399 Unspecified symptoms and signs involving the genitourinary system: Secondary | ICD-10-CM

## 2018-06-04 NOTE — Progress Notes (Addendum)
Location:  [place] of Service:  Clinic (12) Provider: Jabria Loos FNP-C  Blanchie Serve, MD  Patient Care Team: Blanchie Serve, MD as PCP - General (Internal Medicine) [place] Netta Cedars, MD as Consulting Physician (Orthopedic Surgery) Luberta Mutter, MD as Consulting Physician (Ophthalmology) Sydnee Levans, MD as Consulting Physician (Dermatology) Adrian Prows, MD as Consulting Physician (Cardiology)  Extended Emergency Contact Information Primary Emergency Contact: Wilhemena Durie [place] of [place] Phone: [phone] Mobile Phone: [phone] Relation: Daughter   Goals of care: Advanced Directive information Advanced Directives 06/04/2018  Does Patient Have a Medical Advance Directive? Yes  Type of Paramedic of [place];Living will  Does patient want to make changes to medical advance directive? No - Patient declined  Copy of [place] in Chart? Yes     Chief Complaint  Patient presents with  . Acute Visit    Vaginal Bleeding     HPI:  Pt is a 84 y.o. female seen today at [facility] clinic for an acute visit for evaluation of vaginal bleeding.she states has a bleeding for more than two weeks.she describes bleeding as bright red blood though states does see well.She thinks bleeding started after she had some constipation.she states constipation has resolved.she denies any abdominal pain/cramping,bloating,nausea or vomiting.she also denies any fever or chills.no weight loss.    Past Medical History:  Diagnosis Date  . Actinic keratosis 02/13/2012  . Acute bronchospasm 10/31/2011  . Cancer ([place]) 04/2008   Skin cancer of low back [name], MD  . Cataract   . Disorder of bone and cartilage, unspecified 02/18/2007  . Dizziness and giddiness 08/30/2010  . Edema 12/19/2011  . 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 Osteoarthrosis, unspecified whether generalized  or localized, unspecified site 08/20/2006  . Other abnormal blood chemistry 03/14/2011  . Other and unspecified hyperlipidemia 10/18/2010  . Palpitations 02/13/2012  . Postmenopausal bleeding 08/30/2010  . Shortness of breath 11/28/2011  . Supraventricular premature beats 05/23/2011  . Unspecified essential hypertension 08/20/2006   Past Surgical History:  Procedure Laterality Date  . CARPAL TUNNEL RELEASE  2004   S. Norris MD  . CATARACT EXTRACTION W/ INTRAOCULAR LENS IMPLANT Right 2010  . CATARACT EXTRACTION W/ INTRAOCULAR LENS IMPLANT Left 10/2012  . CESAREAN SECTION  [phone]   x3  . EYE SURGERY Right 07/2009   cataract extraction/IOLI [name], MD  . KNEE ARTHROSCOPY Right 2008   Torn meniscus, Dr. Veverly Fells    Allergies  Allergen Reactions  . Lomotil [Diphenoxylate] Nausea And Vomiting  . Sulfa Antibiotics   . Amlodipine Swelling    Outpatient Encounter Medications as of 06/04/2018  Medication Sig  . aspirin EC 81 MG tablet Take 1 tablet (81 mg total) by mouth daily.  [name] losartan-hydrochlorothiazide (HYZAAR) 100-25 MG tablet TAKE 1 TABLET DAILY TO CONTROL BLOOD PRESSURE  . metoprolol tartrate (LOPRESSOR) 50 MG tablet Take one tablet by mouth twice daily to help heart rhythm and blood pressure  . naproxen (NAPROSYN) 250 MG tablet Take 250 mg by mouth 2 (two) times daily with a meal.   No facility-administered encounter medications on file as of 06/04/2018.     Review of Systems  Constitutional: Negative for appetite change, chills, fatigue, fever and unexpected weight change.  Respiratory: Negative for cough, chest tightness, shortness of breath and wheezing.   Cardiovascular: Negative for chest pain, palpitations and leg swelling.  Gastrointestinal: Negative for abdominal distention, abdominal pain, constipation, diarrhea, nausea and vomiting.  Genitourinary: Positive for frequency and vaginal bleeding. Negative for dysuria, flank pain, urgency and vaginal pain.    Skin: Negative for color change, pallor and rash.  Neurological: Negative for dizziness, light-headedness and headaches.  Psychiatric/Behavioral: Negative for agitation, confusion and sleep disturbance. The patient is not nervous/anxious.     Immunization History  Administered Date(s) Administered  . Influenza Whole 08/21/2010, 05/21/2012  . Influenza, High Dose Seasonal PF 05/30/2017  . Influenza-Unspecified 06/04/2014, 05/20/2015, 06/08/2016  . Pneumococcal Conjugate-13 08/22/1999  . Td 08/22/1999  . Zoster 08/21/2005   Pertinent  Health Maintenance Due  Topic Date Due  . DEXA SCAN  04/12/1990  . PNA vac Low Risk Adult (2 of 2 - PPSV23) 08/21/2000  . INFLUENZA VACCINE  03/21/2018   Fall Risk  06/04/2018 09/12/2017 07/04/2016 01/04/2016 12/28/2015  Falls in the past year? No No No No Yes  Number falls in past yr: - - - 1 1  Injury with Fall? - - - No No   Functional Status Survey:    Vitals:   06/04/18 1301  BP: 130/64  Pulse: 71  Temp: 98 F (36.7 C)  TempSrc: Oral  SpO2: 98%  Weight: 114 lb (51.7 kg)  Height: 5\' 2"  (1.575 m)   Body mass index is 20.85 kg/m. Physical Exam  Constitutional: She is oriented to person, place, and time.  Thin built, elderly in no acute distress    HENT:  Head: Normocephalic.  Mouth/Throat: Oropharynx is clear and moist. No oropharyngeal exudate.  Eyes: Pupils are equal, round, and reactive to light. Right eye exhibits no discharge. Left eye exhibits no discharge. No scleral icterus.  Neck: Normal range of motion.  Cardiovascular: Normal rate, regular rhythm, normal heart sounds and intact distal pulses. Exam reveals no gallop and no friction rub.  No murmur heard. Pulmonary/Chest: Effort normal and breath sounds normal. No respiratory distress. She has no wheezes. She has no rales.  Abdominal: Soft. Bowel sounds are normal. She exhibits no distension and no mass. There is no tenderness. There is no rebound and no guarding.   Genitourinary: Rectal exam shows no external hemorrhoid, no internal hemorrhoid and no tenderness. Pelvic exam was performed with patient supine. There is no rash, tenderness or lesion on the right labia. There is no rash, tenderness or lesion on the left labia.  Genitourinary Comments: Yellow loose drainage noted on vaginal and rectal areas and pad lining.discharge consistent with loose stool though patient insist that it's blood.   Musculoskeletal: She exhibits no edema or tenderness.  Moves x 4 extremities unsteady gait ambulates with walker.   Lymphadenopathy:    She has no cervical adenopathy.  Neurological: She is oriented to person, place, and time. Gait abnormal.  Skin: Skin is warm and dry. No rash noted. No erythema. No pallor.  Psychiatric: She has a normal mood and affect. Her speech is normal and behavior is normal. Judgment and thought content normal.  Vitals reviewed.  Labs reviewed: Recent Labs    07/26/17 0000 01/24/18 0820  NA 136 132*  K 3.7 3.5  CL 95* 91*  CO2 32 31  GLUCOSE 93 93  BUN 15 18  CREATININE 0.48* 0.54*  CALCIUM 9.3 9.5   Recent Labs    07/26/17 0000 01/24/18 0820  AST 15 15  ALT 7 6  BILITOT 0.6 0.6  PROT 6.0* 6.5   Recent Labs    07/26/17 0000 01/24/18 0820  WBC 7.2 7.9  NEUTROABS 4,802  --   HGB 12.1 12.7  HCT 36.4 37.6  MCV 83.7 83.2  PLT 257 279   Lab Results  Component Value Date   TSH 2.38 01/24/2018    Lab Results  Component Value Date   CHOL 205 (H) 01/24/2018   HDL 81 01/24/2018   LDLCALC 109 (H) 01/24/2018   TRIG 65 01/24/2018   CHOLHDL 2.5 01/24/2018    Significant Diagnostic Results in last 30 days:  No results found.  Assessment/Plan  1. Vaginal bleeding Afebrile.Yellow drainage noted on pad,vaginal/rectal area seems more consistent with stool then blood patient insist it's bleeding.suspect possible stool incontinent verse fissure.will obtain a Pelvic/ abdominal ultrasound to evaluate abnormal bleeding  to rule out mass or malignancy.Encouraged good peri care hygiene and change incontinent pad lining and pull up frequently.Notify provider if symptoms worsen.CBC/diff 06/06/2018.    2. Symptoms of Urinary tract infections Afebrile.urine frequency with yellow drainage noted on exam will check urine for U/A and C/S rule out UTI.   Family/ staff Communication: Reviewed plan of care with patient and facility Nurse supervisor  Labs/tests ordered: - urine for U/A and C/S rule out UTI - Pelvic/ abdominal ultrasound to evaluate abnormal bleeding to rule out mass or malignancy. - CBC/diff 06/06/2018.  Addendum: 06/13/2018 Patient's pelvic ultrasound resulted: No acute findings. Patient and daughter was notified of ultra sound results by CMA.Patient states still having vaginal bleeding.Refer to Gynecology for further evaluation.       Sandrea Hughs, NP

## 2018-06-04 NOTE — Patient Instructions (Signed)
1. Ultrasound order to be done at [facility] will call you with results. 2. Notify provider's office if symptoms worsen or running any fever > 100.5 3. Change sanitary pad frequently and perform peri-care to prevent urinary tract infection.  4. Get lab work done on 06/06/2018 at the Assisted living facility.

## 2018-06-06 ENCOUNTER — Other Ambulatory Visit: Payer: Medicare Other

## 2018-06-06 ENCOUNTER — Other Ambulatory Visit: Payer: Self-pay | Admitting: Family

## 2018-06-06 DIAGNOSIS — N939 Abnormal uterine and vaginal bleeding, unspecified: Secondary | ICD-10-CM

## 2018-06-07 ENCOUNTER — Telehealth: Payer: Self-pay

## 2018-06-07 LAB — URINE CULTURE
MICRO NUMBER:: 91249248
SPECIMEN QUALITY:: ADEQUATE

## 2018-06-07 LAB — CBC WITH DIFFERENTIAL/PLATELET
BASOS ABS: 21 {cells}/uL (ref 0–200)
BASOS PCT: 0.3 %
EOS PCT: 0.4 %
Eosinophils Absolute: 28 cells/uL (ref 15–500)
HCT: 38.3 % (ref 35.0–45.0)
HEMOGLOBIN: 12.9 g/dL (ref 11.7–15.5)
Lymphs Abs: 1297 cells/uL (ref 850–3900)
MCH: 27.2 pg (ref 27.0–33.0)
MCHC: 33.7 g/dL (ref 32.0–36.0)
MCV: 80.6 fL (ref 80.0–100.0)
MONOS PCT: 7.4 %
MPV: 10.3 fL (ref 7.5–12.5)
NEUTROS ABS: 5044 {cells}/uL (ref 1500–7800)
Neutrophils Relative %: 73.1 %
Platelets: 309 10*3/uL (ref 140–400)
RBC: 4.75 10*6/uL (ref 3.80–5.10)
RDW: 13.6 % (ref 11.0–15.0)
Total Lymphocyte: 18.8 %
WBC mixed population: 511 cells/uL (ref 200–950)
WBC: 6.9 10*3/uL (ref 3.8–10.8)

## 2018-06-07 LAB — URINALYSIS, ROUTINE W REFLEX MICROSCOPIC
BILIRUBIN URINE: NEGATIVE
GLUCOSE, UA: NEGATIVE
Hyaline Cast: NONE SEEN /LPF
Ketones, ur: NEGATIVE
NITRITE: POSITIVE — AB
Specific Gravity, Urine: 1.01 (ref 1.001–1.03)
Squamous Epithelial / LPF: NONE SEEN /HPF (ref <=5)
WBC, UA: >=60 /HPF — AB (ref 0–5)
pH: 7.5 (ref 5.0–8.0)

## 2018-06-07 NOTE — Telephone Encounter (Signed)
  Notes recorded by Sandrea Hughs, NP on 06/07/2018 at 12:38 PM EDT 1.CBC result shows no signs of anemia 2.Urine analysis shows signs of urinary tract infection.awaiting final results for urine culture and sensitivity 3.also check with patient if transvaginal ultrasound has been done.    Spoke with patient about recent lab results. Let her know we are still awaiting lab results for her urine culture. Patient verbalized understand and had no further questions at this time.  Asked patient if she had went to have a transvaginal ultrasound . She stated no and did not believe she would be able to go for the procedure. I contacted Jori Moll, [place] Liaison at [facility] and let her know of the patients concerns.

## 2018-06-07 NOTE — Telephone Encounter (Signed)
Called Terri Acosta and discussed arrangements that need to be made for her mom and her wanting to do the procedure. She expressed concern for her mom and wanted to make sure we still set up appointment with [facility] to have the vaginal u/s performed. I assured her that the appointment would not be canceled and she would receive a call from [place] once the appointment has been set up.  I also give her Corporate treasurer ([place] [facility]) phone number to contact for any transportation and accomodation needs. Terri Acosta said she would wait on contacting [place] about transportation until the appointment has been set up. Terri Acosta verbalized understanding and had no further questions at this time.

## 2018-06-07 NOTE — Telephone Encounter (Addendum)
Patients daughter Santiago Glad called and requested to speak with Melissa. I informed Santiago Glad I will forward message to [facility] to have her follow-up. Please call Santiago Glad on her cell phone.  Santiago Glad state her mother is going to get the vaginal U/S for she needs to see what is causing her symptoms.   I also provided Santiago Glad with the number to [facility] to have her contact them with specific questions about how procedure will be performed, what accomodation will be made, how long procedure will take, ect .  . . Santiago Glad plans to  call today.  Routed to Ruthell Rummage, CMA  S.Chrae B/CMA

## 2018-06-10 ENCOUNTER — Telehealth: Payer: Self-pay

## 2018-06-10 NOTE — Addendum Note (Signed)
Addended byMarlowe Sax C on: 06/10/2018 10:18 AM   Modules accepted: Orders

## 2018-06-10 NOTE — Telephone Encounter (Signed)
I called Central Scheduling and spoke with Keri, scheduled appointment for Wednesday @ 1:30 pm, patient to arrive at 1:15 pm with a full bladder. Patient to start drinking 32 oz of water about 12:45pm.   Patient to come in main entrance, 1st floor radiology.   Left message with female to have Santiago Glad (patient's daughter) return call.  I called patient and gave her appointment information (location, date, time, and special instructions). Patient states she will orchestrate transportation with [facility]. Patient was also given the number in the event that she needs to change appointment.  Awaiting return from Santiago Glad (patients daughter)

## 2018-06-10 NOTE — Telephone Encounter (Signed)
I called centralized scheduling to set up appointment for US OB Transvaginal (order placed on 06/04/18).  Per Manuela Schwartz the order was placed incorrectly and will need to be clarified and corrected prior to scheduling.  The current order is for a patient that is pregnant, that is the OB meaning of order placed. Webb Silversmith will need to decide if patient needs a Transvaginal U/S or a Transvaginal w/Pelvis U/S  I deleted incorrect order  Dinah please advise

## 2018-06-10 NOTE — Telephone Encounter (Signed)
Spoke with Terri Acosta verbalized understanding of appointment information and details. Santiago Glad plans to follow-up with her mom to assure that she recorded information correctly and that transportation will be set up through [facility] (not [facility] as patient stated)

## 2018-06-12 ENCOUNTER — Ambulatory Visit (HOSPITAL_COMMUNITY)
Admission: RE | Admit: 2018-06-12 | Discharge: 2018-06-12 | Disposition: A | Discharge status: Home / Self Care | Payer: Medicare Other | Source: Ambulatory Visit | Attending: Family | Admitting: Family

## 2018-06-12 DIAGNOSIS — N939 Abnormal uterine and vaginal bleeding, unspecified: Secondary | ICD-10-CM | POA: Insufficient documentation

## 2018-06-13 NOTE — Addendum Note (Signed)
Addended byMarlowe Sax C on: 06/13/2018 12:49 PM   Modules accepted: Orders

## 2018-06-14 ENCOUNTER — Other Ambulatory Visit: Payer: Self-pay

## 2018-06-14 DIAGNOSIS — R399 Unspecified symptoms and signs involving the genitourinary system: Secondary | ICD-10-CM

## 2018-06-17 ENCOUNTER — Other Ambulatory Visit: Payer: Medicare Other

## 2018-06-20 ENCOUNTER — Telehealth: Payer: Self-pay

## 2018-06-20 NOTE — Telephone Encounter (Signed)
Discussed lab results with Lucita Ferrara. Informed patient to call us if she had any new symptoms. Encouraged patient to drink plenty of fluid per Dinah's recommendations written on lab results. Patient verbalized understanding and had no further questions or concerns. Asked Raffaela if she had been scheduled an appointment with gynecologist yet.  Patient stated that she has appointment to see gynecologist 06/25/2018.

## 2018-06-25 ENCOUNTER — Encounter: Payer: Self-pay | Admitting: Obstetrics and Gynecology

## 2018-06-25 ENCOUNTER — Ambulatory Visit: Payer: Medicare Other | Admitting: Obstetrics and Gynecology

## 2018-06-25 ENCOUNTER — Other Ambulatory Visit: Payer: Self-pay

## 2018-06-25 VITALS — BP 124/84 | HR 60 | Ht 62.21 in | Wt 110.2 lb

## 2018-06-25 DIAGNOSIS — N950 Postmenopausal bleeding: Secondary | ICD-10-CM

## 2018-06-25 DIAGNOSIS — N952 Postmenopausal atrophic vaginitis: Secondary | ICD-10-CM | POA: Diagnosis not present

## 2018-06-25 DIAGNOSIS — Z124 Encounter for screening for malignant neoplasm of cervix: Secondary | ICD-10-CM | POA: Diagnosis not present

## 2018-06-25 DIAGNOSIS — N898 Other specified noninflammatory disorders of vagina: Secondary | ICD-10-CM | POA: Diagnosis not present

## 2018-06-25 NOTE — Progress Notes (Signed)
84 y.o. G9P3003 Widowed White or Caucasian Not Hispanic or Latino female presents c/o a 4 week h/o light vaginal bleeding.  At the most she has using a panty liner, never bleed through it. She is now having spotting. No bright red blood, always brown. No pain. She had an episode of constipation a month ago, took some mineral oil. No straining. Now normal bowel and bladder function. She reports passing normal formed stools, no diarrhea.  Patient did have a PUS (transabdominal only) on 06/12/2018 which was without abnormalities, but was limited. The endometrium couldn't be see. Results in Epic. Did have her urine checked on  06/06/2018 which was negative. Results in Epic.  Reviewed records, including evaluation by the NP, who felt the d/c was stool not blood.     No LMP recorded. Patient is postmenopausal.          Sexually active: No.  The current method of family planning is post menopausal status.    Exercising: No.  The patient does not participate in regular exercise at present. Smoker:  no  Health Maintenance: Pap:  30 years ago History of abnormal Pap:  No MMG:  08/25/2005 Birads 1 negative BMD:   Never Colonoscopy: Never TDaP:  Unsure   reports that she quit smoking about 40 years ago. She has never used smokeless tobacco. She reports that she drinks alcohol. She reports that she does not use drugs. She has 3 grown children, many grandchildren and great grandchildren. No one is local. She is happy living in the assisted living facility, no plans or desires to move.   Past Medical History:  Diagnosis Date  . Abnormal uterine bleeding   . Actinic keratosis 02/13/2012  . Acute bronchospasm 10/31/2011  . Cancer ([place]) 04/2008   Skin cancer of low back [name], MD  . Cataract   . Disorder of bone and cartilage, unspecified 02/18/2007  . Dizziness and giddiness 08/30/2010  . Dysmenorrhea   . Edema 12/19/2011  . Endometriosis   . 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  Osteoarthrosis, unspecified whether generalized or localized, unspecified site 08/20/2006  . Osteoporosis   . Other abnormal blood chemistry 03/14/2011  . Other and unspecified hyperlipidemia 10/18/2010  . Palpitations 02/13/2012  . Postmenopausal bleeding 08/30/2010  . Shortness of breath 11/28/2011  . Supraventricular premature beats 05/23/2011  . Unspecified essential hypertension 08/20/2006    Past Surgical History:  Procedure Laterality Date  . CARPAL TUNNEL RELEASE  2004   S. Norris MD  . CATARACT EXTRACTION W/ INTRAOCULAR LENS IMPLANT Right 2010  . CATARACT EXTRACTION W/ INTRAOCULAR LENS IMPLANT Left 10/2012  . DILATION AND CURETTAGE OF UTERUS    . EYE SURGERY Right 07/2009   cataract extraction/IOLI [name], MD  . KNEE ARTHROSCOPY Right 2008   Torn meniscus, Dr. Veverly Fells  The patient reports a h/o endometrial polyps  Current Outpatient Medications  Medication Sig Dispense Refill  . aspirin EC 81 MG tablet Take 1 tablet (81 mg total) by mouth daily. 30 tablet 0  . losartan-hydrochlorothiazide (HYZAAR) 100-25 MG tablet TAKE 1 TABLET DAILY TO CONTROL BLOOD PRESSURE 90 tablet 4  . metoprolol tartrate (LOPRESSOR) 50 MG tablet Take one tablet by mouth twice daily to help heart rhythm and blood pressure 180 tablet 1  . naproxen (NAPROSYN) 250 MG tablet Take 250 mg by mouth 2 (two) times daily with a meal.     No current facility-administered medications for this visit.     Family History  Problem Relation Age of  Onset  . Diabetes Brother   . COPD Brother     Review of Systems  Constitutional: Negative.   HENT: Negative.   Eyes: Negative.   Respiratory: Negative.   Cardiovascular: Negative.   Gastrointestinal: Negative.   Endocrine: Negative.   Genitourinary: Positive for vaginal bleeding.  Musculoskeletal: Negative.   Skin: Negative.   Allergic/Immunologic: Negative.   Neurological: Negative.   Hematological: Negative.   Psychiatric/Behavioral: Negative.     Exam:    BP 124/84 (BP Location: Right Arm, Patient Position: Sitting, Cuff Size: Normal)   Pulse 60   Ht 5' 2.21" (1.58 m)   Wt 110 lb 3.2 oz (50 kg)   BMI 20.02 kg/m   Weight change: @WEIGHTCHANGE @ Height:   Height: 5' 2.21" (158 cm)  Ht Readings from Last 3 Encounters:  06/25/18 5' 2.21" (1.58 m)  06/04/18 5\' 2"  (1.575 m)  02/27/18 5\' 2"  (1.575 m)    General appearance: alert, cooperative and appears stated age. Patient alert and orient, great historian.  Head: Normocephalic, without obvious abnormality, atraumatic Neck: no adenopathy, supple, symmetrical, trachea midline and thyroid normal to inspection and palpation Lungs: clear to auscultation bilaterally Cardiovascular: regular rate and rhythm Abdomen: soft, non-tender; non distended,  no masses,  no organomegaly Extremities: extremities normal, atraumatic, no cyanosis or edema Skin: Skin color, texture, turgor normal. No rashes or lesions Lymph nodes: Cervical nodes normal. No abnormal inguinal nodes palpated Neurologic: Grossly normal   Pelvic: External genitalia:  no lesions, large amount of brown d/c on the vulva              Urethra:  normal appearing urethra with no masses, tenderness or lesions              Bartholins and Skenes: normal                 Vagina: very atrophic appearing vagina friable with opening the pediatric speculum. There was this brown d/c in the vagina, not seen coming from the cervix.               Cervix: no cervical motion tenderness and no lesions               Bimanual Exam:  Uterus:  normal size, contour, position, consistency, mobility, non-tender              Adnexa: no mass, fullness, tenderness               Rectovaginal: Confirms, stool is a different color from the D/C.               Anus:  normal sphincter tone, no lesions  Heme tested the d/c from her underwear, heme + on one of the 2 panels. Different than the color of her stool.   Chaperone was present for exam.  A:  Postmenopausal  bleeding/abnormal thick brown discharge, definitely in her vagina, heme +, but faintly. The d/c looks the consistency of diarrhea, but is different than the stool on rectal exam and is clearly in her vagina. Doubt fistula.   P:   Pap with vaginitis panel  Return for a pelvic ultrasound, need to get better visualization of her endometrium. Possible sonohysterogram, possible endometrial biopsy  Discussed the possibility of hysteroscopy, D&C    CC: Marlowe Sax, NP

## 2018-06-27 LAB — CYTOLOGY - PAP
Bacterial vaginitis: NEGATIVE
Candida vaginitis: NEGATIVE
Diagnosis: NEGATIVE
Trichomonas: NEGATIVE

## 2018-07-01 NOTE — Progress Notes (Signed)
GYNECOLOGY  VISIT   HPI: 84 y.o.   Widowed White or Caucasian Not Hispanic or Latino  female   [id number] with No LMP recorded. Patient is postmenopausal.   here for consult following PUS. The patient was originally sent for PMP bleeding. At the time of her exam she was noted to have a copious yellow/brown discharge, seen in her vagina and on the vulva. No frank red blood, always brown. She has formed stools, no diarrhea. No urinary c/o.    GYNECOLOGIC HISTORY: No LMP recorded. Patient is postmenopausal. Contraception: Postmenopausal Menopausal hormone therapy: None        OB History    Gravida  3   Para  3   Term  3   Preterm      AB      Living  3     SAB      TAB      Ectopic      Multiple      Live Births  3              Patient Active Problem List   Diagnosis Date Noted  . Cyst of right kidney 07/27/2017  . Knee pain, chronic 12/26/2016  . Unstable gait 07/04/2016  . COPD (chronic obstructive pulmonary disease) ([place]) 08/04/2014  . Palpitations 02/13/2012  . Hyperglycemia 03/14/2011  . Hyperlipidemia 10/18/2010  . Disorder of bone and cartilage, unspecified 02/18/2007  . Essential hypertension 08/20/2006  . Osteoarthritis 08/20/2006    Past Medical History:  Diagnosis Date  . Abnormal uterine bleeding   . Actinic keratosis 02/13/2012  . Acute bronchospasm 10/31/2011  . Cancer ([place]) 04/2008   Skin cancer of low back [name], MD  . Cataract   . Disorder of bone and cartilage, unspecified 02/18/2007  . Dizziness and giddiness 08/30/2010  . Dysmenorrhea   . Edema 12/19/2011  . Endometriosis   . 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 Osteoarthrosis, unspecified whether generalized or localized, unspecified site 08/20/2006  . Osteoporosis   . Other abnormal blood chemistry 03/14/2011  . Other and unspecified hyperlipidemia 10/18/2010  . Palpitations 02/13/2012  . Postmenopausal bleeding 08/30/2010  . Shortness of breath 11/28/2011  .  Supraventricular premature beats 05/23/2011  . Unspecified essential hypertension 08/20/2006    Past Surgical History:  Procedure Laterality Date  . CARPAL TUNNEL RELEASE  2004   S. Norris MD  . CATARACT EXTRACTION W/ INTRAOCULAR LENS IMPLANT Right 2010  . CATARACT EXTRACTION W/ INTRAOCULAR LENS IMPLANT Left 10/2012  . DILATION AND CURETTAGE OF UTERUS    . EYE SURGERY Right 07/2009   cataract extraction/IOLI [name], MD  . KNEE ARTHROSCOPY Right 2008   Torn meniscus, Dr. Veverly Fells    Current Outpatient Medications  Medication Sig Dispense Refill  . aspirin EC 81 MG tablet Take 1 tablet (81 mg total) by mouth daily. 30 tablet 0  . losartan-hydrochlorothiazide (HYZAAR) 100-25 MG tablet TAKE 1 TABLET DAILY TO CONTROL BLOOD PRESSURE 90 tablet 4  . metoprolol tartrate (LOPRESSOR) 50 MG tablet Take one tablet by mouth twice daily to help heart rhythm and blood pressure 180 tablet 1  . naproxen (NAPROSYN) 250 MG tablet Take 250 mg by mouth 2 (two) times daily with a meal.     No current facility-administered medications for this visit.      ALLERGIES: Lomotil [diphenoxylate]; Sulfa antibiotics; and Amlodipine  Family History  Problem Relation Age of Onset  . Diabetes Brother   . COPD Brother     Social History  Socioeconomic History  . Marital status: Widowed    Spouse name: Not on file  . Number of children: Not on file  . Years of education: Not on file  . Highest education level: Not on file  Occupational History  . Not on file  Social Needs  . Financial resource strain: Not on file  . Food insecurity:    Worry: Not on file    Inability: Not on file  . Transportation needs:    Medical: Not on file    Non-medical: Not on file  Tobacco Use  . Smoking status: Former Smoker    Last attempt to quit: 02/11/1978    Years since quitting: 40.4  . Smokeless tobacco: Never Used  Substance and Sexual Activity  . Alcohol use: Yes  . Drug use: No  . Sexual activity: Not  Currently  Lifestyle  . Physical activity:    Days per week: Not on file    Minutes per session: Not on file  . Stress: Not on file  Relationships  . Social connections:    Talks on phone: Not on file    Gets together: Not on file    Attends religious service: Not on file    Active member of club or organization: Not on file    Attends meetings of clubs or organizations: Not on file    Relationship status: Widowed  . Intimate partner violence:    Fear of current or ex partner: Not on file    Emotionally abused: Not on file    Physically abused: Not on file    Forced sexual activity: Not on file  Other Topics Concern  . Not on file  Social History Narrative   Lives alone in a apartment at [facility] in the independent living area since 2007   The patient is not exercising regularly, walking with walker   No specific diet.   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   She has a living will, POA   Former smoker, stopped 1979   Alcohol - one glass of wine at night    Review of Systems  Constitutional: Negative.   HENT: Negative.   Eyes: Negative.   Respiratory: Negative.   Cardiovascular: Negative.   Gastrointestinal: Negative.   Genitourinary: Negative.   Musculoskeletal: Negative.   Skin: Negative.   Neurological: Negative.   Endo/Heme/Allergies: Negative.   Psychiatric/Behavioral: Negative.     PHYSICAL EXAMINATION:    BP 132/82 (BP Location: Right Arm, Patient Position: Sitting, Cuff Size: Normal)   Pulse 64   Wt 110 lb (49.9 kg)   BMI 19.99 kg/m     General appearance: alert, cooperative and appears stated age Abdomen: soft, non-tender; non distended, no masses,  no organomegaly  Pelvic: External genitalia:  no lesions, some of the yellow/brown mucous d/c is seen on the vulva.              Urethra:  normal appearing urethra with no masses, tenderness or lesions              Bartholins and Skenes: normal                 Vagina: very atrophic  appearing vagina, brown d/c seen, bleeds with opening the pediatric speculum              Cervix: no lesions and small, no clear drainage from the cervix              Bimanual  Exam:  Uterus:  normal size, contour, position, consistency, mobility, non-tender              Adnexa: no mass, fullness, tenderness              Rectovaginal: Yes.  .  Confirms.              Anus:  normal sphincter tone, no lesions. Stool is a darker color than the brownish d/c  D/C is heme +   ST cath done, appeared to have large PVR on ultrasound, she voided one more time prior to cath, normal PRV ~30 cc, normal appearing urine   Chaperone was present for exam.  ASSESSMENT Brown vaginal d/c, Heme +, source of d/c is not completely clear. Normal GYN ultrasound, clear fluid in uterine cavity (not viscous appearing), thin endometrium, suspect uterine fluid is from cervical stenosis Very atrophic vaginal mucosa Blood in urine Doubt RV fistula    PLAN Discussed the option of endometrial biopsy. She has a natural sonohysterogram with very thin endometrium, no masses seen, no cervical abnormalities seen  Send urine for ua, c&s (dip + for blood) Recommend trial of vaginal estrogen, she has such severe atrophy I suspect the d/c may be from that If the d/c doesn't improve with the vaginal estrogen, then further consider endometrial biopsy and evaluation for RV fistula   An After Visit Summary was printed and given to the patient.  Over 15 minutes face to face time of which over 50% was spent in counseling.     CC: Marlowe Sax, NP

## 2018-07-02 ENCOUNTER — Ambulatory Visit (INDEPENDENT_AMBULATORY_CARE_PROVIDER_SITE_OTHER): Payer: Medicare Other

## 2018-07-02 ENCOUNTER — Encounter: Payer: Self-pay | Admitting: Obstetrics and Gynecology

## 2018-07-02 ENCOUNTER — Ambulatory Visit (INDEPENDENT_AMBULATORY_CARE_PROVIDER_SITE_OTHER): Payer: Medicare Other | Admitting: Obstetrics and Gynecology

## 2018-07-02 ENCOUNTER — Other Ambulatory Visit: Payer: Self-pay

## 2018-07-02 ENCOUNTER — Other Ambulatory Visit: Payer: Self-pay | Admitting: Obstetrics and Gynecology

## 2018-07-02 VITALS — BP 132/82 | HR 64 | Wt 110.0 lb

## 2018-07-02 DIAGNOSIS — N950 Postmenopausal bleeding: Secondary | ICD-10-CM

## 2018-07-02 DIAGNOSIS — R319 Hematuria, unspecified: Secondary | ICD-10-CM | POA: Diagnosis not present

## 2018-07-02 DIAGNOSIS — N952 Postmenopausal atrophic vaginitis: Secondary | ICD-10-CM

## 2018-07-02 LAB — POCT URINALYSIS DIPSTICK
BILIRUBIN UA: NEGATIVE
Glucose, UA: NEGATIVE
KETONES UA: NEGATIVE
Leukocytes, UA: NEGATIVE
Nitrite, UA: NEGATIVE
Protein, UA: NEGATIVE
RBC UA: POSITIVE
SPEC GRAV UA: 1.01 (ref 1.010–1.025)
Urobilinogen, UA: 0.2 E.U./dL
pH, UA: 5 (ref 5.0–8.0)

## 2018-07-02 MED ORDER — ESTRADIOL 10 MCG VA TABS: ORAL_TABLET | VAGINAL | 0 refills | Status: DC

## 2018-07-03 ENCOUNTER — Encounter: Payer: Self-pay | Admitting: Obstetrics and Gynecology

## 2018-07-03 LAB — URINALYSIS, MICROSCOPIC ONLY
BACTERIA UA: NONE SEEN
Casts: NONE SEEN /lpf
Epithelial Cells (non renal): NONE SEEN /hpf (ref 0–10)

## 2018-07-04 LAB — URINE CULTURE: ORGANISM ID, BACTERIA: NO GROWTH

## 2018-07-08 ENCOUNTER — Telehealth: Payer: Self-pay

## 2018-07-08 NOTE — Telephone Encounter (Signed)
-----   Message from Salvadore Dom, MD sent at 07/04/2018  5:40 PM EST ----- Urine culture is negative, please see if she was able to get the vaginal estrogen and if she has started using it yet.

## 2018-07-08 NOTE — Telephone Encounter (Signed)
Spoke with patient. Results given. Patient verbalizes understanding. Patient states that she picked up vaginal estrogen and has used 4 doses. Reports she is not noticing any difference thus far with symptoms. Advised will review with Dr. Talbert Nan and return call with any additional recommendations.

## 2018-07-08 NOTE — Telephone Encounter (Signed)
Spoke with patient. Advised of message as seen below from Dr.Jertson. Patient verbalizes understanding. Encounter closed. 

## 2018-07-08 NOTE — Telephone Encounter (Signed)
It may take a little longer to see a result. I would expect by the end of 2 weeks that things will improve (if this is the cause)

## 2018-07-10 ENCOUNTER — Other Ambulatory Visit: Payer: Self-pay | Admitting: Family

## 2018-07-11 DIAGNOSIS — I10 Essential (primary) hypertension: Secondary | ICD-10-CM

## 2018-07-11 DIAGNOSIS — E785 Hyperlipidemia, unspecified: Secondary | ICD-10-CM

## 2018-07-11 LAB — COMPLETE METABOLIC PANEL WITH GFR
AG Ratio: 1.8 (calc) (ref 1.0–2.5)
ALT: 7 U/L (ref 6–29)
AST: 15 U/L (ref 10–35)
Albumin: 4.2 g/dL (ref 3.6–5.1)
Alkaline phosphatase (APISO): 49 U/L (ref 33–130)
BUN / CREAT RATIO: 28 (calc) — AB (ref 6–22)
BUN: 16 mg/dL (ref 7–25)
CHLORIDE: 93 mmol/L — AB (ref 98–110)
CO2: 30 mmol/L (ref 20–32)
CREATININE: 0.58 mg/dL — AB (ref 0.60–0.88)
Calcium: 9.5 mg/dL (ref 8.6–10.4)
GFR, EST NON AFRICAN AMERICAN: 79 mL/min/{1.73_m2} (ref >=60)
GFR, Est African American: 92 mL/min/{1.73_m2} (ref >=60)
GLOBULIN: 2.3 g/dL (ref 1.9–3.7)
Glucose, Bld: 89 mg/dL (ref 65–99)
Potassium: 3.8 mmol/L (ref 3.5–5.3)
SODIUM: 135 mmol/L (ref 135–146)
TOTAL PROTEIN: 6.5 g/dL (ref 6.1–8.1)
Total Bilirubin: 0.6 mg/dL (ref 0.2–1.2)

## 2018-07-11 LAB — LIPID PANEL
CHOL/HDL RATIO: 2.5 (calc) (ref <5.0)
CHOLESTEROL: 203 mg/dL — AB (ref <200)
HDL: 80 mg/dL (ref >50)
LDL Cholesterol (Calc): 107 mg/dL (calc) — ABNORMAL HIGH
Non-HDL Cholesterol (Calc): 123 mg/dL (calc) (ref <130)
Triglycerides: 69 mg/dL (ref <150)

## 2018-07-11 LAB — CBC
HCT: 37.6 % (ref 35.0–45.0)
HEMOGLOBIN: 12.7 g/dL (ref 11.7–15.5)
MCH: 28.4 pg (ref 27.0–33.0)
MCHC: 33.8 g/dL (ref 32.0–36.0)
MCV: 84.1 fL (ref 80.0–100.0)
MPV: 10.3 fL (ref 7.5–12.5)
Platelets: 278 10*3/uL (ref 140–400)
RBC: 4.47 10*6/uL (ref 3.80–5.10)
RDW: 14.9 % (ref 11.0–15.0)
WBC: 7 10*3/uL (ref 3.8–10.8)

## 2018-07-12 ENCOUNTER — Other Ambulatory Visit: Payer: Self-pay

## 2018-07-12 DIAGNOSIS — I10 Essential (primary) hypertension: Secondary | ICD-10-CM

## 2018-07-12 DIAGNOSIS — R748 Abnormal levels of other serum enzymes: Secondary | ICD-10-CM

## 2018-07-17 ENCOUNTER — Non-Acute Institutional Stay: Payer: Medicare Other | Admitting: Family Medicine

## 2018-07-17 ENCOUNTER — Encounter: Payer: Self-pay | Admitting: Family Medicine

## 2018-07-17 ENCOUNTER — Encounter: Payer: Self-pay | Admitting: Internal Medicine

## 2018-07-17 VITALS — BP 140/82 | HR 63 | Temp 97.8°F | Ht 62.0 in | Wt 108.0 lb

## 2018-07-17 DIAGNOSIS — M150 Primary generalized (osteo)arthritis: Secondary | ICD-10-CM | POA: Diagnosis not present

## 2018-07-17 DIAGNOSIS — I10 Essential (primary) hypertension: Secondary | ICD-10-CM | POA: Diagnosis not present

## 2018-07-17 DIAGNOSIS — J449 Chronic obstructive pulmonary disease, unspecified: Secondary | ICD-10-CM

## 2018-07-17 DIAGNOSIS — M159 Polyosteoarthritis, unspecified: Secondary | ICD-10-CM

## 2018-07-17 DIAGNOSIS — R002 Palpitations: Secondary | ICD-10-CM | POA: Diagnosis not present

## 2018-07-17 NOTE — Progress Notes (Signed)
Provider:  Alain Honey, MD Location:  [place] of Service:  Clinic (12)  PCP: Sandrea Hughs, NP Patient Care Team: Ngetich, Nelda Bucks, NP as PCP - General (Family Medicine) [place], [facility] Netta Cedars, MD as Consulting Physician (Orthopedic Surgery) Luberta Mutter, MD as Consulting Physician (Ophthalmology) Sydnee Levans, MD as Consulting Physician (Dermatology) Adrian Prows, MD as Consulting Physician (Cardiology)  Extended Emergency Contact Information Primary Emergency Contact: [place] Phone: [phone] Mobile Phone: [phone] Relation: Daughter  Code Status: FULL Goals of Care: Advanced Directive information Advanced Directives 07/17/2018  Does Patient Have a Medical Advance Directive? Yes  Type of Advance Directive [place]  Does patient want to make changes to medical advance directive? No - Patient declined  Copy of [place] in Chart? Yes - validated most recent copy scanned in chart (See row information)      Chief Complaint  Patient presents with  . Medical Management of Chronic Issues    6 month follow up     HPI: Patient is a 84 y.o. female seen today for follow-up BP. Mobility issues, palpitations, and cognitive check.  Doing well with her motorized WC.  Could not get around facility without it due to arthritis in knee(s) and hip. No recent falss; triesto be very careful transferring in and out WC.  Good safety awareness BP well controlled on combo regimen.  metoprolol also for palpitations. Has been seeing GYN for bleeding due to atrophic vaginitis; on estrogen topical pill. No memory issues.  Easily answered my question of what shew had for supper last night.  Past Medical History:  Diagnosis Date  . Abnormal uterine bleeding   . Actinic keratosis 02/13/2012  . Acute bronchospasm 10/31/2011  . Cancer ([place]) 04/2008   Skin cancer of low back  [name], MD  . Cataract   . Disorder of bone and cartilage, unspecified 02/18/2007  . Dizziness and giddiness 08/30/2010  . Dysmenorrhea   . Edema 12/19/2011  . Endometriosis   . 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 Osteoarthrosis, unspecified whether generalized or localized, unspecified site 08/20/2006  . Osteoporosis   . Other abnormal blood chemistry 03/14/2011  . Other and unspecified hyperlipidemia 10/18/2010  . Palpitations 02/13/2012  . Postmenopausal bleeding 08/30/2010  . Shortness of breath 11/28/2011  . Supraventricular premature beats 05/23/2011  . Unspecified essential hypertension 08/20/2006   Past Surgical History:  Procedure Laterality Date  . CARPAL TUNNEL RELEASE  2004   S. Norris MD  . CATARACT EXTRACTION W/ INTRAOCULAR LENS IMPLANT Right 2010  . CATARACT EXTRACTION W/ INTRAOCULAR LENS IMPLANT Left 10/2012  . DILATION AND CURETTAGE OF UTERUS    . EYE SURGERY Right 07/2009   cataract extraction/IOLI [name], MD  . KNEE ARTHROSCOPY Right 2008   Torn meniscus, Dr. Veverly Fells    reports that she quit smoking about 40 years ago. She has never used smokeless tobacco. She reports that she drinks alcohol. She reports that she does not use drugs. Social History   Socioeconomic History  . Marital status: Widowed    Spouse name: Not on file  . Number of children: Not on file  . Years of education: Not on file  . Highest education level: Not on file  Occupational History  . Not on file  Social Needs  . Financial resource strain: Not on file  . Food insecurity:    Worry: Not on file    Inability: Not on  file  . Transportation needs:    Medical: Not on file    Non-medical: Not on file  Tobacco Use  . Smoking status: Former Smoker    Last attempt to quit: 02/11/1978    Years since quitting: 40.4  . Smokeless tobacco: Never Used  Substance and Sexual Activity  . Alcohol use: Yes  . Drug use: No  . Sexual activity: Not Currently  Lifestyle  . Physical  activity:    Days per week: Not on file    Minutes per session: Not on file  . Stress: Not on file  Relationships  . Social connections:    Talks on phone: Not on file    Gets together: Not on file    Attends religious service: Not on file    Active member of club or organization: Not on file    Attends meetings of clubs or organizations: Not on file    Relationship status: Widowed  . Intimate partner violence:    Fear of current or ex partner: Not on file    Emotionally abused: Not on file    Physically abused: Not on file    Forced sexual activity: Not on file  Other Topics Concern  . Not on file  Social History Narrative   Lives alone in a apartment at [facility] in the independent living area since 2007   The patient is not exercising regularly, walking with walker   No specific diet.   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   She has a living will, POA   Former smoker, stopped 1979   Alcohol - one glass of wine at night    Functional Status Survey:    Family History  Problem Relation Age of Onset  . Diabetes Brother   . COPD Brother     Health Maintenance  Topic Date Due  . DEXA SCAN  04/12/1990  . PNA vac Low Risk Adult (2 of 2 - PPSV23) 08/21/2000  . TETANUS/TDAP  06/21/2019 (Originally 08/21/2009)  . INFLUENZA VACCINE  Completed    Allergies  Allergen Reactions  . Lomotil [Diphenoxylate] Nausea And Vomiting  . Sulfa Antibiotics   . Amlodipine Swelling    Outpatient Encounter Medications as of 07/17/2018  Medication Sig  . aspirin EC 81 MG tablet Take 1 tablet (81 mg total) by mouth daily.  . Estradiol 10 MCG TABS vaginal tablet Place one tablet vaginally qhs x 1 week, then every other night for one week, then 2 x a week at hs.  . losartan-hydrochlorothiazide (HYZAAR) 100-25 MG tablet TAKE 1 TABLET DAILY TO CONTROL BLOOD PRESSURE  . metoprolol tartrate (LOPRESSOR) 50 MG tablet Take one tablet by mouth twice daily to help heart rhythm and blood  pressure  . naproxen (NAPROSYN) 250 MG tablet Take 250 mg by mouth 2 (two) times daily with a meal.   No facility-administered encounter medications on file as of 07/17/2018.     Review of Systems  Constitutional: Positive for unexpected weight change.  HENT: Negative.   Respiratory: Negative.   Cardiovascular: Negative.   Gastrointestinal: Negative.   Genitourinary: Positive for vaginal bleeding.  Neurological: Negative.   Psychiatric/Behavioral: Negative.     Vitals:   07/17/18 1059  BP: 140/82  Pulse: 63  Temp: 97.8 F (36.6 C)  TempSrc: Oral  SpO2: 95%  Weight: 108 lb (49 kg)  Height: 5\' 2"  (1.575 m)   Body mass index is 19.75 kg/m. Physical Exam  Constitutional: She appears well-developed.  HENT:  Head: Normocephalic.  Mouth/Throat: Oropharynx is clear and moist.  Eyes: Pupils are equal, round, and reactive to light.  Neck: Normal range of motion. Neck supple.  Cardiovascular: Normal rate and regular rhythm.  Murmur heard. Occasional extrasystole  Pulmonary/Chest: Effort normal and breath sounds normal.  Abdominal: Soft. Bowel sounds are normal.  Musculoskeletal: Normal range of motion.  Neurological: She is alert.  Nursing note and vitals reviewed.   Labs reviewed: Basic Metabolic Panel: Recent Labs    07/26/17 0000 01/24/18 0820 07/10/18 0000  NA 136 132* 135  K 3.7 3.5 3.8  CL 95* 91* 93*  CO2 32 31 30  GLUCOSE 93 93 89  BUN 15 18 16   CREATININE 0.48* 0.54* 0.58*  CALCIUM 9.3 9.5 9.5   Liver Function Tests: Recent Labs    07/26/17 0000 01/24/18 0820 07/10/18 0000  AST 15 15 15   ALT 7 6 7   BILITOT 0.6 0.6 0.6  PROT 6.0* 6.5 6.5   No results for input(s): LIPASE, AMYLASE in the last 8760 hours. No results for input(s): AMMONIA in the last 8760 hours. CBC: Recent Labs    07/26/17 0000 01/24/18 0820 06/06/18 0000 07/10/18 0000  WBC 7.2 7.9 6.9 7.0  NEUTROABS 4,802  --  5,044  --   HGB 12.1 12.7 12.9 12.7  HCT 36.4 37.6 38.3  37.6  MCV 83.7 83.2 80.6 84.1  PLT 257 279 309 278   Cardiac Enzymes: No results for input(s): CKTOTAL, CKMB, CKMBINDEX, TROPONINI in the last 8760 hours. BNP: Invalid input(s): POCBNP No results found for: HGBA1C Lab Results  Component Value Date   TSH 2.38 01/24/2018   No results found for: VITAMINB12 No results found for: FOLATE No results found for: IRON, TIBC, FERRITIN  Imaging and Procedures obtained prior to SNF admission: US Pelvis Complete  Result Date: 06/13/2018 CLINICAL DATA:  Abnormal uterine bleeding EXAM: TRANSABDOMINAL ULTRASOUND OF PELVIS TECHNIQUE: Transabdominal ultrasound examination of the pelvis was performed including evaluation of the uterus, ovaries, adnexal regions, and pelvic cul-de-sac. COMPARISON:  None. FINDINGS: Uterus Measurements: 3.6 x 1.7 x 2.5 cm. No fibroids or other mass visualized. Endometrium Thickness: Not visualized transabdominally. Right ovary Measurements: Not visualized.  No adnexal mass seen. Left ovary Measurements: Not visualized.  No adnexal mass seen. Other findings:  No abnormal free fluid. IMPRESSION: Small uterus. The endometrium cannot be visualized transabdominally. The patient reportedly refused transvaginal imaging. No acute findings visualized in the pelvis. Electronically Signed   By: Rolm Baptise M.D.   On: 06/13/2018 00:46    Assessment/Plan 1. Essential hypertension Pressures have been acceptable. Takes Metoprolol BID and Losartan/HCTZ in AM  2. Chronic obstructive pulmonary disease, unspecified COPD type ([place]) No real breathing difficulty. Smoked for many years.  No inhalers needed  3. Primary osteoarthritis involving multiple joints Uses Naprosyn.  I suggested Tylenol arthritis strength might be better choice since issue not really one of inflammation  4. Palpitations No complaints on Metoprolol.  No AF by history   Family/ staff Communication: Suggestions understood by pt  Labs/tests ordered: none  Lillette Boxer.  Sabra Heck, [place] [address] [place], [place] Office [phone]

## 2018-07-25 ENCOUNTER — Encounter: Payer: Self-pay | Admitting: Family

## 2018-07-30 ENCOUNTER — Encounter: Payer: Self-pay | Admitting: Family

## 2018-08-20 ENCOUNTER — Other Ambulatory Visit: Payer: Self-pay | Admitting: Nurse Practitioner

## 2018-08-20 DIAGNOSIS — E785 Hyperlipidemia, unspecified: Secondary | ICD-10-CM

## 2018-08-20 DIAGNOSIS — I10 Essential (primary) hypertension: Secondary | ICD-10-CM

## 2018-10-14 ENCOUNTER — Other Ambulatory Visit: Payer: Medicare Other

## 2018-10-14 DIAGNOSIS — E785 Hyperlipidemia, unspecified: Secondary | ICD-10-CM

## 2018-10-14 DIAGNOSIS — I10 Essential (primary) hypertension: Secondary | ICD-10-CM

## 2018-10-14 LAB — CBC
HCT: 38.2 % (ref 35.0–45.0)
Hemoglobin: 12.7 g/dL (ref 11.7–15.5)
MCH: 28.7 pg (ref 27.0–33.0)
MCHC: 33.2 g/dL (ref 32.0–36.0)
MCV: 86.2 fL (ref 80.0–100.0)
MPV: 10.8 fL (ref 7.5–12.5)
PLATELETS: 251 10*3/uL (ref 140–400)
RBC: 4.43 10*6/uL (ref 3.80–5.10)
RDW: 13.5 % (ref 11.0–15.0)
WBC: 7.3 10*3/uL (ref 3.8–10.8)

## 2018-11-27 ENCOUNTER — Other Ambulatory Visit: Payer: Self-pay | Admitting: Nurse Practitioner

## 2019-01-15 ENCOUNTER — Other Ambulatory Visit: Payer: Self-pay

## 2019-01-15 ENCOUNTER — Encounter: Payer: Self-pay | Admitting: Internal Medicine

## 2019-01-15 ENCOUNTER — Non-Acute Institutional Stay: Payer: Medicare Other | Admitting: Internal Medicine

## 2019-01-15 VITALS — BP 142/88 | HR 59 | Temp 98.4°F | Ht 62.0 in | Wt 109.4 lb

## 2019-01-15 DIAGNOSIS — M159 Polyosteoarthritis, unspecified: Secondary | ICD-10-CM

## 2019-01-15 DIAGNOSIS — J449 Chronic obstructive pulmonary disease, unspecified: Secondary | ICD-10-CM | POA: Diagnosis not present

## 2019-01-15 DIAGNOSIS — M150 Primary generalized (osteo)arthritis: Secondary | ICD-10-CM

## 2019-01-15 DIAGNOSIS — I10 Essential (primary) hypertension: Secondary | ICD-10-CM | POA: Diagnosis not present

## 2019-01-15 DIAGNOSIS — R2681 Unsteadiness on feet: Secondary | ICD-10-CM

## 2019-01-15 DIAGNOSIS — E785 Hyperlipidemia, unspecified: Secondary | ICD-10-CM

## 2019-01-15 DIAGNOSIS — N952 Postmenopausal atrophic vaginitis: Secondary | ICD-10-CM

## 2019-01-15 NOTE — Progress Notes (Signed)
Location:  [place] of Service:  Clinic (12)  Provider:   Code Status: Goals of Care:  Advanced Directives 01/15/2019  Does Patient Have a Medical Advance Directive? Yes  Type of Advance Directive [place]  Does patient want to make changes to medical advance directive? No - Patient declined  Copy of [place] in Chart? Yes - validated most recent copy scanned in chart (See row information)  Would patient like information on creating a medical advance directive? No - Patient declined     Chief Complaint  Patient presents with  . Medical Management of Chronic Issues    6 month follow up   . Health Maintenance    dexa scan,PPSV 23 VACCINE    HPI: Patient is a 85 y.o. female seen today for medical management of chronic diseases.   Patient has h/o Hypertension, Hyperlipidemia, Osteoarthritis, Unstable Gait Wheelchair Dependent, h/o Vaginal Atropy Patient lives in [place] by herself. Did not have any acute issues today with No falls.' She is Independent in her transfers but uses Wheelchair for Gait instability.    Past Medical History:  Diagnosis Date  . Abnormal uterine bleeding   . Actinic keratosis 02/13/2012  . Acute bronchospasm 10/31/2011  . Cancer ([place]) 04/2008   Skin cancer of low back [name], MD  . Cataract   . Disorder of bone and cartilage, unspecified 02/18/2007  . Dizziness and giddiness 08/30/2010  . Dysmenorrhea   . Edema 12/19/2011  . Endometriosis   . 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 Osteoarthrosis, unspecified whether generalized or localized, unspecified site 08/20/2006  . Osteoporosis   . Other abnormal blood chemistry 03/14/2011  . Other and unspecified hyperlipidemia 10/18/2010  . Palpitations 02/13/2012  . Postmenopausal bleeding 08/30/2010  . Shortness of breath 11/28/2011  . Supraventricular premature beats 05/23/2011  . Unspecified essential hypertension 08/20/2006    Past  Surgical History:  Procedure Laterality Date  . CARPAL TUNNEL RELEASE  2004   S. Norris MD  . CATARACT EXTRACTION W/ INTRAOCULAR LENS IMPLANT Right 2010  . CATARACT EXTRACTION W/ INTRAOCULAR LENS IMPLANT Left 10/2012  . DILATION AND CURETTAGE OF UTERUS    . EYE SURGERY Right 07/2009   cataract extraction/IOLI [name], MD  . KNEE ARTHROSCOPY Right 2008   Torn meniscus, Dr. Veverly Fells    Allergies  Allergen Reactions  . Lomotil [Diphenoxylate] Nausea And Vomiting  . Sulfa Antibiotics   . Amlodipine Swelling    Outpatient Encounter Medications as of 01/15/2019  Medication Sig  . aspirin EC 81 MG tablet Take 1 tablet (81 mg total) by mouth daily.  [name] losartan-hydrochlorothiazide (HYZAAR) 100-25 MG tablet TAKE 1 TABLET DAILY TO CONTROL BLOOD PRESSURE  . metoprolol tartrate (LOPRESSOR) 50 MG tablet TAKE 1 TABLET TWICE A DAY TO HELP HEART RHYTHM AND BLOOD PRESSURE  . naproxen (NAPROSYN) 250 MG tablet Take 250 mg by mouth 2 (two) times daily with a meal.  . [DISCONTINUED] Estradiol 10 MCG TABS vaginal tablet Place one tablet vaginally qhs x 1 week, then every other night for one week, then 2 x a week at hs.   No facility-administered encounter medications on file as of 01/15/2019.     Review of Systems:  Review of Systems  Review of Systems  Constitutional: Negative for activity change, appetite change, chills, diaphoresis, fatigue and fever.  HENT: Negative for mouth sores, postnasal drip, rhinorrhea, sinus pain and sore throat.   Respiratory: Negative for apnea, cough, chest tightness,  shortness of breath and wheezing.   Cardiovascular: Negative for chest pain, palpitations and leg swelling.  Gastrointestinal: Negative for abdominal distention, abdominal pain, constipation, diarrhea, nausea and vomiting.  Genitourinary: Negative for dysuria and frequency.  Musculoskeletal: Negative for arthralgias, joint swelling and myalgias.  Skin: Negative for rash.  Neurological: Negative for  dizziness, syncope, weakness, light-headedness and numbness.  Psychiatric/Behavioral: Negative for behavioral problems, confusion and sleep disturbance.     Health Maintenance  Topic Date Due  . DEXA SCAN  04/12/1990  . PNA vac Low Risk Adult (2 of 2 - PPSV23) 08/21/2000  . TETANUS/TDAP  06/21/2019 (Originally 08/21/2009)  . INFLUENZA VACCINE  03/22/2019    Physical Exam: Vitals:   01/15/19 1249  BP: (!) 142/88  Pulse: (!) 59  Temp: 98.4 F (36.9 C)  TempSrc: Oral  SpO2: 96%  Weight: 109 lb 6.4 oz (49.6 kg)  Height: 5\' 2"  (1.575 m)   Body mass index is 20.01 kg/m. Physical Exam  Constitutional: Oriented to person, place, and time. Well-developed and well-nourished.  HENT:  Head: Normocephalic.  Mouth/Throat: Oropharynx is clear and moist.  Eyes: Pupils are equal, round, and reactive to light.  Neck: Neck supple.  Cardiovascular: Normal rate and normal heart sounds.  No murmur heard. Pulmonary/Chest: Effort normal and breath sounds normal. No respiratory distress. No wheezes. She has no rales.  Abdominal: Soft. Bowel sounds are normal. No distension. There is no tenderness. There is no rebound.  Musculoskeletal: No edema.  Lymphadenopathy: none Neurological: Alert and oriented to person, place, and time. Was able to get up from the chair and walk few Steps Skin: Skin is warm and dry.  Psychiatric: Normal mood and affect. Behavior is normal. Thought content normal.      Labs reviewed: Basic Metabolic Panel: Recent Labs    01/24/18 0820 07/10/18 0000  NA 132* 135  K 3.5 3.8  CL 91* 93*  CO2 31 30  GLUCOSE 93 89  BUN 18 16  CREATININE 0.54* 0.58*  CALCIUM 9.5 9.5  TSH 2.38  --    Liver Function Tests: Recent Labs    01/24/18 0820 07/10/18 0000  AST 15 15  ALT 6 7  BILITOT 0.6 0.6  PROT 6.5 6.5   No results for input(s): LIPASE, AMYLASE in the last 8760 hours. No results for input(s): AMMONIA in the last 8760 hours. CBC: Recent Labs    06/06/18  0000 07/10/18 0000 10/11/18 0000  WBC 6.9 7.0 7.3  NEUTROABS 5,044  --   --   HGB 12.9 12.7 12.7  HCT 38.3 37.6 38.2  MCV 80.6 84.1 86.2  PLT 309 278 251   Lipid Panel: Recent Labs    01/24/18 0820 07/10/18 0000  CHOL 205* 203*  HDL 81 80  LDLCALC 109* 107*  TRIG 65 69  CHOLHDL 2.5 2.5   No results found for: HGBA1C  Procedures since last visit: No results found.  Assessment/Plan Essential hypertension BP controlled on Metorpolol and Hyzaar Repeat BMP next visit   COPD type ([place]) Does not use any inhalers Staying stable  Hyperlipidemia,  Not intrested on Statin Continue Diet Control  Unstable gait Uses Motorized chair No Falls  Primary osteoarthritis involving multiple joints Takes Naprosyn QD We talked about the side effects Health Maintenece Does not want to go for Her TDAP and Penumonia Shots right now Also Does not want DEXA scan   Labs/tests ordered:   Next appt: Follow up in 6 months   Total time spent in this  patient care encounter was  40_  minutes; greater than 50% of the visit spent counseling patient and staff, reviewing records , Labs and coordinating care for problems addressed at this encounter.

## 2019-05-26 ENCOUNTER — Other Ambulatory Visit: Payer: Self-pay | Admitting: Nurse Practitioner

## 2019-07-14 ENCOUNTER — Other Ambulatory Visit: Payer: Medicare Other

## 2019-07-14 ENCOUNTER — Other Ambulatory Visit: Payer: Self-pay

## 2019-07-14 DIAGNOSIS — E785 Hyperlipidemia, unspecified: Secondary | ICD-10-CM

## 2019-07-14 DIAGNOSIS — I10 Essential (primary) hypertension: Secondary | ICD-10-CM

## 2019-07-15 LAB — TSH: TSH: 1.78 mIU/L (ref 0.40–4.50)

## 2019-07-15 LAB — COMPLETE METABOLIC PANEL WITH GFR
AG Ratio: 1.7 (calc) (ref 1.0–2.5)
ALT: 8 U/L (ref 6–29)
AST: 15 U/L (ref 10–35)
Albumin: 3.8 g/dL (ref 3.6–5.1)
Alkaline phosphatase (APISO): 44 U/L (ref 37–153)
BUN/Creatinine Ratio: 29 (calc) — ABNORMAL HIGH (ref 6–22)
BUN: 17 mg/dL (ref 7–25)
CO2: 30 mmol/L (ref 20–32)
Calcium: 9.4 mg/dL (ref 8.6–10.4)
Chloride: 94 mmol/L — ABNORMAL LOW (ref 98–110)
Creat: 0.58 mg/dL — ABNORMAL LOW (ref 0.60–0.88)
GFR, Est African American: 91 mL/min/{1.73_m2} (ref >=60)
GFR, Est Non African American: 79 mL/min/{1.73_m2} (ref >=60)
Globulin: 2.3 g/dL (calc) (ref 1.9–3.7)
Glucose, Bld: 98 mg/dL (ref 65–99)
Potassium: 3.7 mmol/L (ref 3.5–5.3)
Sodium: 136 mmol/L (ref 135–146)
Total Bilirubin: 0.5 mg/dL (ref 0.2–1.2)
Total Protein: 6.1 g/dL (ref 6.1–8.1)

## 2019-07-15 LAB — LIPID PANEL
Cholesterol: 191 mg/dL (ref <200)
HDL: 65 mg/dL (ref >=50)
LDL Cholesterol (Calc): 110 mg/dL (calc) — ABNORMAL HIGH
Non-HDL Cholesterol (Calc): 126 mg/dL (calc) (ref <130)
Total CHOL/HDL Ratio: 2.9 (calc) (ref <5.0)
Triglycerides: 71 mg/dL (ref <150)

## 2019-07-23 ENCOUNTER — Encounter: Payer: Self-pay | Admitting: Internal Medicine

## 2019-07-23 ENCOUNTER — Other Ambulatory Visit: Payer: Self-pay

## 2019-07-23 ENCOUNTER — Non-Acute Institutional Stay: Payer: Medicare Other | Admitting: Internal Medicine

## 2019-07-23 VITALS — BP 128/78 | HR 75 | Temp 96.8°F | Ht 62.0 in | Wt 99.4 lb

## 2019-07-23 DIAGNOSIS — J449 Chronic obstructive pulmonary disease, unspecified: Secondary | ICD-10-CM | POA: Diagnosis not present

## 2019-07-23 DIAGNOSIS — R2681 Unsteadiness on feet: Secondary | ICD-10-CM | POA: Diagnosis not present

## 2019-07-23 DIAGNOSIS — I10 Essential (primary) hypertension: Secondary | ICD-10-CM | POA: Diagnosis not present

## 2019-07-23 DIAGNOSIS — M8949 Other hypertrophic osteoarthropathy, multiple sites: Secondary | ICD-10-CM

## 2019-07-23 DIAGNOSIS — E785 Hyperlipidemia, unspecified: Secondary | ICD-10-CM | POA: Diagnosis not present

## 2019-07-23 DIAGNOSIS — M159 Polyosteoarthritis, unspecified: Secondary | ICD-10-CM

## 2019-07-23 NOTE — Progress Notes (Signed)
Location:  [place] of Service:  Clinic (12)  Provider:   Code Status:  Goals of Care:  Advanced Directives 01/15/2019  Does Patient Have a Medical Advance Directive? Yes  Type of Advance Directive [place]  Does patient want to make changes to medical advance directive? No - Patient declined  Copy of [place] in Chart? Yes - validated most recent copy scanned in chart (See row information)  Would patient like information on creating a medical advance directive? No - Patient declined     Chief Complaint  Patient presents with  . Medical Management of Chronic Issues    6 month follow up with labs    HPI: Patient is a 85 y.o. female seen today for medical management of chronic diseases.    Patient has h/o Hypertension, Hyperlipidemia, Osteoarthritis, Unstable Gait Wheelchair Dependent, h/o Vaginal Atropy, Macular Degeneration  Patient came for regular Follow up. She did not have any acute complains. No Falls She has lost some weight though she says her Appetite is good. Has been independent in her ADLS She has been feeling little bored due to Covid restrictions She does have h/o Chronic Arthritis and takes Naproxen BID  Also has Macular degeneration with loss of vision in Left Eye and decreased vision in Right Eye  Her family is mostly Out of town.     Past Medical History:  Diagnosis Date  . Abnormal uterine bleeding   . Actinic keratosis 02/13/2012  . Acute bronchospasm 10/31/2011  . Cancer ([place]) 04/2008   Skin cancer of low back [name], MD  . Cataract   . Disorder of bone and cartilage, unspecified 02/18/2007  . Dizziness and giddiness 08/30/2010  . Dysmenorrhea   . Edema 12/19/2011  . Endometriosis   . 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 Osteoarthrosis, unspecified whether generalized or localized, unspecified site 08/20/2006  . Osteoporosis   . Other abnormal blood chemistry 03/14/2011  .  Other and unspecified hyperlipidemia 10/18/2010  . Palpitations 02/13/2012  . Postmenopausal bleeding 08/30/2010  . Shortness of breath 11/28/2011  . Supraventricular premature beats 05/23/2011  . Unspecified essential hypertension 08/20/2006    Past Surgical History:  Procedure Laterality Date  . CARPAL TUNNEL RELEASE  2004   S. Norris MD  . CATARACT EXTRACTION W/ INTRAOCULAR LENS IMPLANT Right 2010  . CATARACT EXTRACTION W/ INTRAOCULAR LENS IMPLANT Left 10/2012  . DILATION AND CURETTAGE OF UTERUS    . EYE SURGERY Right 07/2009   cataract extraction/IOLI [name], MD  . KNEE ARTHROSCOPY Right 2008   Torn meniscus, Dr. Veverly Fells    Allergies  Allergen Reactions  . Lomotil [Diphenoxylate] Nausea And Vomiting  . Sulfa Antibiotics   . Amlodipine Swelling    Outpatient Encounter Medications as of 07/23/2019  Medication Sig  . aspirin EC 81 MG tablet Take 1 tablet (81 mg total) by mouth daily.  [name] losartan-hydrochlorothiazide (HYZAAR) 100-25 MG tablet TAKE 1 TABLET DAILY TO CONTROL BLOOD PRESSURE  . metoprolol tartrate (LOPRESSOR) 50 MG tablet TAKE 1 TABLET TWICE A DAY TO HELP HEART RHYTHM AND BLOOD PRESSURE  . naproxen (NAPROSYN) 250 MG tablet Take 250 mg by mouth 2 (two) times daily with a meal.   No facility-administered encounter medications on file as of 07/23/2019.     Review of Systems:  Review of Systems  Review of Systems  Constitutional: Negative for activity change, appetite change, chills, diaphoresis, fatigue and fever.  HENT: Negative for mouth sores, postnasal  drip, rhinorrhea, sinus pain and sore throat.   Respiratory: Negative for apnea, cough, chest tightness, shortness of breath and wheezing.   Cardiovascular: Negative for chest pain, palpitations and leg swelling.  Gastrointestinal: Negative for abdominal distention, abdominal pain, constipation, diarrhea, nausea and vomiting.  Genitourinary: Negative for dysuria and frequency.  Musculoskeletal: Negative for  arthralgias, joint swelling and myalgias.  Skin: Negative for rash.  Neurological: Negative for dizziness, syncope, weakness, light-headedness and numbness.  Psychiatric/Behavioral: Negative for behavioral problems, confusion and sleep disturbance.     Health Maintenance  Topic Date Due  . DEXA SCAN  04/12/1990  . PNA vac Low Risk Adult (2 of 2 - PPSV23) 08/21/2000  . TETANUS/TDAP  08/21/2009  . INFLUENZA VACCINE  Completed    Physical Exam: Vitals:   07/23/19 1304  BP: 128/78  Pulse: 75  Temp: (!) 96.8 F (36 C)  TempSrc: Temporal  SpO2: 94%  Weight: 99 lb 6.4 oz (45.1 kg)  Height: 5\' 2"  (1.575 m)   Body mass index is 18.18 kg/m. Physical Exam  Constitutional: Oriented to person, place, and time. Well-developed and well-nourished.  HENT:  Head: Normocephalic.  Mouth/Throat: Oropharynx is clear and moist.  Eyes: Pupils are equal, round, and reactive to light.  Neck: Neck supple.  Cardiovascular: Normal rate and normal heart sounds.  No murmur heard. Pulmonary/Chest: Effort normal and breath sounds normal. No respiratory distress. No wheezes. She has no rales.  Abdominal: Soft. Bowel sounds are normal. No distension. There is no tenderness. There is no rebound.  Musculoskeletal: No edema.  Lymphadenopathy: none Neurological: Alert and oriented to person, place, and time. No Focal Deficits Was able to get up from the chair and walk few Steps Skin: Skin is warm and dry.  Psychiatric: Normal mood and affect. Behavior is normal. Thought content normal.    Labs reviewed: Basic Metabolic Panel: Recent Labs    07/14/19 0820  NA 136  K 3.7  CL 94*  CO2 30  GLUCOSE 98  BUN 17  CREATININE 0.58*  CALCIUM 9.4  TSH 1.78   Liver Function Tests: Recent Labs    07/14/19 0820  AST 15  ALT 8  BILITOT 0.5  PROT 6.1   No results for input(s): LIPASE, AMYLASE in the last 8760 hours. No results for input(s): AMMONIA in the last 8760 hours. CBC: Recent Labs     10/11/18 0000  WBC 7.3  HGB 12.7  HCT 38.2  MCV 86.2  PLT 251   Lipid Panel: Recent Labs    07/14/19 0820  CHOL 191  HDL 65  LDLCALC 110*  TRIG 71  CHOLHDL 2.9   No results found for: HGBA1C  Procedures since last visit: No results found.  Assessment/Plan  Essential hypertension On Hyzaar and Lopressor Renal Function Stable   COPD type ([place]) Not on any Inhalers Hyperlipidemia,  Does not want any statins Unstable gait Uses Motorized chair No Falls  Primary osteoarthritis involving multiple joints Takes Naprosyn BID with Meals We discussed about trying Tylenol and reducing the dose She will try  We talked about the side effects Health Maintenece Does not want to go for Her TDAP and Penumonia Shots right now Also Does not want DEXA scan  Labs/tests ordered:  * No order type specified * Next appt:  Visit date not found  Total time spent in this patient care encounter was  30_  minutes; greater than 50% of the visit spent counseling patient and staff, reviewing records , Labs and coordinating care  for problems addressed at this encounter.

## 2019-08-05 ENCOUNTER — Other Ambulatory Visit: Payer: Self-pay | Admitting: *Deleted

## 2019-08-05 MED ORDER — LOSARTAN POTASSIUM-HCTZ 100-25 MG PO TABS: ORAL_TABLET | ORAL | 3 refills | Status: DC

## 2019-08-05 NOTE — Telephone Encounter (Signed)
[facility]

## 2019-10-24 ENCOUNTER — Other Ambulatory Visit: Payer: Self-pay | Admitting: *Deleted

## 2019-10-24 MED ORDER — LOSARTAN POTASSIUM-HCTZ 100-25 MG PO TABS: ORAL_TABLET | ORAL | 0 refills | Status: DC

## 2019-10-24 NOTE — Telephone Encounter (Signed)
Daughter called and stated that [facility] has not shipped patient's Losartan out yet, patient is going to run out before medication is received. Daughter is requesting #[phone] be sent to Local pharmacy until they can receive the supply. Rx sent.

## 2019-10-31 ENCOUNTER — Other Ambulatory Visit: Payer: Self-pay | Admitting: Nurse Practitioner

## 2019-10-31 NOTE — Telephone Encounter (Signed)
Ok to fill 

## 2019-12-15 ENCOUNTER — Other Ambulatory Visit: Payer: Self-pay | Admitting: Nurse Practitioner

## 2019-12-31 ENCOUNTER — Telehealth: Payer: Self-pay | Admitting: *Deleted

## 2019-12-31 NOTE — Telephone Encounter (Signed)
Patient called and stated that she is having Diarrhea. Stated that she wants an appointment with [facility] or Dr. Lyndel Safe at Clinic or something called in to help with the Diarrhea. No appointments available at the clinic and patient cannot come into office.  Stated that the Diarrhea started about a week ago. At first was going only once daily and now it is 4-5 times a day. Very Loose. No other symptoms noted. No Fever. Hasn't taken anything for it.  Please Advise.

## 2019-12-31 NOTE — Telephone Encounter (Signed)
[name], [name], NP  You 17 minutes ago (10:09 AM)   Please route the message to Dr. Lyndel Safe   Message text         Routed to Dr. Lyndel Safe per [facility].

## 2019-12-31 NOTE — Telephone Encounter (Signed)
Discussed with the patient. She will call the transportation Lucianne Lei to see if she can get a ride to [facility] for [facility] clinic and will call back to let us know.

## 2020-01-15 ENCOUNTER — Other Ambulatory Visit: Payer: Self-pay

## 2020-01-15 ENCOUNTER — Other Ambulatory Visit: Payer: Medicare Other

## 2020-01-15 ENCOUNTER — Telehealth: Payer: Self-pay

## 2020-01-15 DIAGNOSIS — I10 Essential (primary) hypertension: Secondary | ICD-10-CM

## 2020-01-15 DIAGNOSIS — E876 Hypokalemia: Secondary | ICD-10-CM

## 2020-01-15 LAB — COMPLETE METABOLIC PANEL WITH GFR
AG Ratio: 1.6 (calc) (ref 1.0–2.5)
ALT: 7 U/L (ref 6–29)
AST: 12 U/L (ref 10–35)
Albumin: 3.6 g/dL (ref 3.6–5.1)
Alkaline phosphatase (APISO): 51 U/L (ref 37–153)
BUN/Creatinine Ratio: 35 (calc) — ABNORMAL HIGH (ref 6–22)
BUN: 27 mg/dL — ABNORMAL HIGH (ref 7–25)
CO2: 31 mmol/L (ref 20–32)
Calcium: 8.5 mg/dL — ABNORMAL LOW (ref 8.6–10.4)
Chloride: 93 mmol/L — ABNORMAL LOW (ref 98–110)
Creat: 0.78 mg/dL (ref 0.60–0.88)
GFR, Est African American: 75 mL/min/{1.73_m2} (ref >=60)
GFR, Est Non African American: 65 mL/min/{1.73_m2} (ref >=60)
Globulin: 2.3 g/dL (calc) (ref 1.9–3.7)
Glucose, Bld: 112 mg/dL — ABNORMAL HIGH (ref 65–99)
Potassium: 2.5 mmol/L — CL (ref 3.5–5.3)
Sodium: 135 mmol/L (ref 135–146)
Total Bilirubin: 0.7 mg/dL (ref 0.2–1.2)
Total Protein: 5.9 g/dL — ABNORMAL LOW (ref 6.1–8.1)

## 2020-01-15 LAB — CBC WITH DIFFERENTIAL/PLATELET
Absolute Monocytes: 1178 cells/uL — ABNORMAL HIGH (ref 200–950)
Basophils Absolute: 40 cells/uL (ref 0–200)
Basophils Relative: 0.4 %
Eosinophils Absolute: 59 cells/uL (ref 15–500)
Eosinophils Relative: 0.6 %
HCT: 35.4 % (ref 35.0–45.0)
Hemoglobin: 11.8 g/dL (ref 11.7–15.5)
Lymphs Abs: 1079 cells/uL (ref 850–3900)
MCH: 28.2 pg (ref 27.0–33.0)
MCHC: 33.3 g/dL (ref 32.0–36.0)
MCV: 84.5 fL (ref 80.0–100.0)
MPV: 10.4 fL (ref 7.5–12.5)
Monocytes Relative: 11.9 %
Neutro Abs: 7544 cells/uL (ref 1500–7800)
Neutrophils Relative %: 76.2 %
Platelets: 325 10*3/uL (ref 140–400)
RBC: 4.19 10*6/uL (ref 3.80–5.10)
RDW: 13.1 % (ref 11.0–15.0)
Total Lymphocyte: 10.9 %
WBC: 9.9 10*3/uL (ref 3.8–10.8)

## 2020-01-15 NOTE — Telephone Encounter (Signed)
[facility] called with a critical lab value - potassium 2.5 from labs drawn today. They will fax results also.

## 2020-01-15 NOTE — Telephone Encounter (Signed)
She needs to be started on Potassium 20 meq TID for 7 days. Then 20 meq Qd and Needs BMP on Tues. In the facility. Also check with her if she is still having Diarrhea ? If yes she needs to be seen.

## 2020-01-15 NOTE — Telephone Encounter (Addendum)
Patient called with instructions to take potassium as ordered.  Lab appointment was made.  Pt continues to have diarrhea and already has an appointment to be seen in the clinic.  Potassium was added to med list.

## 2020-01-18 ENCOUNTER — Encounter (HOSPITAL_COMMUNITY): Payer: Self-pay

## 2020-01-18 ENCOUNTER — Inpatient Hospital Stay (HOSPITAL_COMMUNITY)
Admission: EM | Admit: 2020-01-18 | Discharge: 2020-01-26 | DRG: 392 | Disposition: A | Discharge status: Skilled Nursing Facility | Payer: Medicare Other | Attending: Internal Medicine | Admitting: Internal Medicine

## 2020-01-18 ENCOUNTER — Other Ambulatory Visit: Payer: Self-pay

## 2020-01-18 DIAGNOSIS — I083 Combined rheumatic disorders of mitral, aortic and tricuspid valves: Secondary | ICD-10-CM | POA: Diagnosis present

## 2020-01-18 DIAGNOSIS — Z66 Do not resuscitate: Secondary | ICD-10-CM | POA: Diagnosis present

## 2020-01-18 DIAGNOSIS — I10 Essential (primary) hypertension: Secondary | ICD-10-CM | POA: Diagnosis not present

## 2020-01-18 DIAGNOSIS — Z961 Presence of intraocular lens: Secondary | ICD-10-CM | POA: Diagnosis present

## 2020-01-18 DIAGNOSIS — Z882 Allergy status to sulfonamides status: Secondary | ICD-10-CM

## 2020-01-18 DIAGNOSIS — Z85828 Personal history of other malignant neoplasm of skin: Secondary | ICD-10-CM

## 2020-01-18 DIAGNOSIS — R197 Diarrhea, unspecified: Secondary | ICD-10-CM | POA: Diagnosis not present

## 2020-01-18 DIAGNOSIS — M810 Age-related osteoporosis without current pathological fracture: Secondary | ICD-10-CM | POA: Diagnosis present

## 2020-01-18 DIAGNOSIS — I480 Paroxysmal atrial fibrillation: Secondary | ICD-10-CM | POA: Diagnosis present

## 2020-01-18 DIAGNOSIS — K529 Noninfective gastroenteritis and colitis, unspecified: Secondary | ICD-10-CM | POA: Diagnosis not present

## 2020-01-18 DIAGNOSIS — Z825 Family history of asthma and other chronic lower respiratory diseases: Secondary | ICD-10-CM

## 2020-01-18 DIAGNOSIS — Z791 Long term (current) use of non-steroidal anti-inflammatories (NSAID): Secondary | ICD-10-CM

## 2020-01-18 DIAGNOSIS — E785 Hyperlipidemia, unspecified: Secondary | ICD-10-CM | POA: Diagnosis present

## 2020-01-18 DIAGNOSIS — E7849 Other hyperlipidemia: Secondary | ICD-10-CM | POA: Diagnosis present

## 2020-01-18 DIAGNOSIS — R000 Tachycardia, unspecified: Secondary | ICD-10-CM | POA: Diagnosis present

## 2020-01-18 DIAGNOSIS — I4891 Unspecified atrial fibrillation: Secondary | ICD-10-CM

## 2020-01-18 DIAGNOSIS — Z9841 Cataract extraction status, right eye: Secondary | ICD-10-CM

## 2020-01-18 DIAGNOSIS — I482 Chronic atrial fibrillation, unspecified: Secondary | ICD-10-CM | POA: Diagnosis present

## 2020-01-18 DIAGNOSIS — Z833 Family history of diabetes mellitus: Secondary | ICD-10-CM

## 2020-01-18 DIAGNOSIS — E860 Dehydration: Secondary | ICD-10-CM | POA: Diagnosis not present

## 2020-01-18 DIAGNOSIS — Z888 Allergy status to other drugs, medicaments and biological substances status: Secondary | ICD-10-CM

## 2020-01-18 DIAGNOSIS — M1990 Unspecified osteoarthritis, unspecified site: Secondary | ICD-10-CM | POA: Diagnosis present

## 2020-01-18 DIAGNOSIS — Z9842 Cataract extraction status, left eye: Secondary | ICD-10-CM

## 2020-01-18 DIAGNOSIS — D649 Anemia, unspecified: Secondary | ICD-10-CM | POA: Diagnosis present

## 2020-01-18 DIAGNOSIS — Z20822 Contact with and (suspected) exposure to covid-19: Secondary | ICD-10-CM | POA: Diagnosis present

## 2020-01-18 DIAGNOSIS — Z87891 Personal history of nicotine dependence: Secondary | ICD-10-CM

## 2020-01-18 DIAGNOSIS — Z79899 Other long term (current) drug therapy: Secondary | ICD-10-CM

## 2020-01-18 DIAGNOSIS — J45909 Unspecified asthma, uncomplicated: Secondary | ICD-10-CM | POA: Diagnosis present

## 2020-01-18 DIAGNOSIS — K7460 Unspecified cirrhosis of liver: Secondary | ICD-10-CM | POA: Diagnosis present

## 2020-01-18 DIAGNOSIS — Z7982 Long term (current) use of aspirin: Secondary | ICD-10-CM

## 2020-01-18 DIAGNOSIS — E876 Hypokalemia: Secondary | ICD-10-CM

## 2020-01-18 LAB — COMPREHENSIVE METABOLIC PANEL
ALT: 10 U/L (ref 0–44)
AST: 14 U/L — ABNORMAL LOW (ref 15–41)
Albumin: 3 g/dL — ABNORMAL LOW (ref 3.5–5.0)
Alkaline Phosphatase: 47 U/L (ref 38–126)
Anion gap: 12 (ref 5–15)
BUN: 37 mg/dL — ABNORMAL HIGH (ref 8–23)
CO2: 31 mmol/L (ref 22–32)
Calcium: 8.1 mg/dL — ABNORMAL LOW (ref 8.9–10.3)
Chloride: 90 mmol/L — ABNORMAL LOW (ref 98–111)
Creatinine, Ser: 0.87 mg/dL (ref 0.44–1.00)
GFR calc Af Amer: >60 mL/min (ref >60)
GFR calc non Af Amer: 57 mL/min — ABNORMAL LOW (ref >60)
Glucose, Bld: 121 mg/dL — ABNORMAL HIGH (ref 70–99)
Potassium: 2.6 mmol/L — CL (ref 3.5–5.1)
Sodium: 133 mmol/L — ABNORMAL LOW (ref 135–145)
Total Bilirubin: 1 mg/dL (ref 0.3–1.2)
Total Protein: 5.8 g/dL — ABNORMAL LOW (ref 6.5–8.1)

## 2020-01-18 LAB — CBC WITH DIFFERENTIAL/PLATELET
Abs Immature Granulocytes: 0.05 10*3/uL (ref 0.00–0.07)
Basophils Absolute: 0.1 10*3/uL (ref 0.0–0.1)
Basophils Relative: 1 %
Eosinophils Absolute: 0 10*3/uL (ref 0.0–0.5)
Eosinophils Relative: 0 %
HCT: 35.8 % — ABNORMAL LOW (ref 36.0–46.0)
Hemoglobin: 11.5 g/dL — ABNORMAL LOW (ref 12.0–15.0)
Immature Granulocytes: 1 %
Lymphocytes Relative: 7 %
Lymphs Abs: 0.8 10*3/uL (ref 0.7–4.0)
MCH: 28 pg (ref 26.0–34.0)
MCHC: 32.1 g/dL (ref 30.0–36.0)
MCV: 87.1 fL (ref 80.0–100.0)
Monocytes Absolute: 0.8 10*3/uL (ref 0.1–1.0)
Monocytes Relative: 7 %
Neutro Abs: 8.6 10*3/uL — ABNORMAL HIGH (ref 1.7–7.7)
Neutrophils Relative %: 84 %
Platelets: 327 10*3/uL (ref 150–400)
RBC: 4.11 MIL/uL (ref 3.87–5.11)
RDW: 13.7 % (ref 11.5–15.5)
WBC: 10.2 10*3/uL (ref 4.0–10.5)
nRBC: 0 % (ref 0.0–0.2)

## 2020-01-18 LAB — SARS CORONAVIRUS 2 BY RT PCR (HOSPITAL ORDER, PERFORMED IN ~~LOC~~ HOSPITAL LAB): SARS Coronavirus 2: NEGATIVE

## 2020-01-18 LAB — PROTIME-INR
INR: 1.1 (ref 0.8–1.2)
Prothrombin Time: 14 seconds (ref 11.4–15.2)

## 2020-01-18 LAB — MAGNESIUM: Magnesium: 1.6 mg/dL — ABNORMAL LOW (ref 1.7–2.4)

## 2020-01-18 MED ORDER — SENNOSIDES-DOCUSATE SODIUM 8.6-50 MG PO TABS: 1.0000 | ORAL_TABLET | Freq: Every evening | ORAL | Status: DC | PRN

## 2020-01-18 MED ORDER — GUAIFENESIN-DM 100-10 MG/5ML PO SYRP
5.0000 mL | ORAL_SOLUTION | ORAL | Status: DC | PRN
  Administered 2020-01-19 – 2020-01-25 (×9): 5 mL via ORAL
  Filled 2020-01-18 (×9): qty 10

## 2020-01-18 MED ORDER — ENOXAPARIN SODIUM 30 MG/0.3ML ~~LOC~~ SOLN
30.0000 mg | SUBCUTANEOUS | Status: DC
  Administered 2020-01-18 – 2020-01-22 (×5): 30 mg via SUBCUTANEOUS
  Filled 2020-01-18 (×5): qty 0.3

## 2020-01-18 MED ORDER — SODIUM CHLORIDE 0.9 % IV SOLN: 1000.0000 mL | INTRAVENOUS | Status: DC

## 2020-01-18 MED ORDER — SODIUM CHLORIDE 0.9 % IV BOLUS (SEPSIS)
500.0000 mL | Freq: Once | INTRAVENOUS | Status: AC
  Administered 2020-01-18: 500 mL via INTRAVENOUS

## 2020-01-18 MED ORDER — ASPIRIN EC 81 MG PO TBEC: 81.0000 mg | DELAYED_RELEASE_TABLET | Freq: Every day | ORAL | Status: DC

## 2020-01-18 MED ORDER — POTASSIUM CHLORIDE CRYS ER 20 MEQ PO TBCR
40.0000 meq | EXTENDED_RELEASE_TABLET | ORAL | Status: AC
  Administered 2020-01-18 – 2020-01-19 (×4): 40 meq via ORAL
  Filled 2020-01-18 (×4): qty 2

## 2020-01-18 MED ORDER — POTASSIUM CHLORIDE 10 MEQ/100ML IV SOLN
10.0000 meq | Freq: Once | INTRAVENOUS | Status: AC
  Administered 2020-01-18: 10 meq via INTRAVENOUS
  Filled 2020-01-18: qty 100

## 2020-01-18 MED ORDER — ONDANSETRON HCL 4 MG PO TABS
4.0000 mg | ORAL_TABLET | Freq: Four times a day (QID) | ORAL | Status: DC | PRN
  Administered 2020-01-24: 4 mg via ORAL
  Filled 2020-01-18: qty 1

## 2020-01-18 MED ORDER — SODIUM CHLORIDE 0.9 % IV SOLN: INTRAVENOUS | Status: DC

## 2020-01-18 MED ORDER — ACETAMINOPHEN 325 MG PO TABS
650.0000 mg | ORAL_TABLET | Freq: Four times a day (QID) | ORAL | Status: DC | PRN
  Administered 2020-01-22: 650 mg via ORAL
  Filled 2020-01-18: qty 2

## 2020-01-18 MED ORDER — ONDANSETRON HCL 4 MG/2ML IJ SOLN
4.0000 mg | Freq: Four times a day (QID) | INTRAMUSCULAR | Status: DC | PRN
  Administered 2020-01-24: 4 mg via INTRAVENOUS
  Filled 2020-01-18: qty 2

## 2020-01-18 MED ORDER — METOPROLOL TARTRATE 50 MG PO TABS
50.0000 mg | ORAL_TABLET | Freq: Two times a day (BID) | ORAL | Status: DC
  Administered 2020-01-18 – 2020-01-22 (×8): 50 mg via ORAL
  Filled 2020-01-18 (×8): qty 1

## 2020-01-18 MED ORDER — ASPIRIN EC 81 MG PO TBEC
81.0000 mg | DELAYED_RELEASE_TABLET | Freq: Every day | ORAL | Status: DC
  Administered 2020-01-19 – 2020-01-24 (×6): 81 mg via ORAL
  Filled 2020-01-18 (×6): qty 1

## 2020-01-18 MED ORDER — METOPROLOL TARTRATE 5 MG/5ML IV SOLN
5.0000 mg | Freq: Once | INTRAVENOUS | Status: AC
  Administered 2020-01-18: 5 mg via INTRAVENOUS
  Filled 2020-01-18: qty 5

## 2020-01-18 MED ORDER — ACETAMINOPHEN 650 MG RE SUPP: 650.0000 mg | Freq: Four times a day (QID) | RECTAL | Status: DC | PRN

## 2020-01-18 MED ORDER — POTASSIUM CHLORIDE 20 MEQ/15ML (10%) PO SOLN
40.0000 meq | Freq: Once | ORAL | Status: AC
  Administered 2020-01-18: 40 meq via ORAL
  Filled 2020-01-18: qty 30

## 2020-01-18 NOTE — ED Provider Notes (Signed)
[facility] DEPT Provider Note   CSN: [id number] Arrival date & time: 01/18/20  1058     History Chief Complaint  Patient presents with  . Diarrhea    Terri Acosta is a 86 y.o. female.  HPI Patient reports diarrhea for about 2 weeks.  This has been persistent.  Usually a couple of episodes of very loose stool per day.  Today patient had explosive diarrhea.  She has not seen any blood.  No abdominal pain.  No vomiting.  Typically eating about the same amount.  This does not seem to be associated with any foods.  Patient does not have sick contacts that she knows of.  She denies any prior episodes of frequent diarrhea.  No recent antibiotics.  [facility] in an [facility] living [facility].  She denies any pain burning urgency with urination.  Patient reports a history of atrial fibrillation.  She reports that she does not experience any symptoms with it.    Past Medical History:  Diagnosis Date  . Abnormal uterine bleeding   . Actinic keratosis 02/13/2012  . Acute bronchospasm 10/31/2011  . Cancer ([place]) 04/2008   Skin cancer of low back [name], MD  . Cataract   . Disorder of bone and cartilage, unspecified 02/18/2007  . Dizziness and giddiness 08/30/2010  . Dysmenorrhea   . Edema 12/19/2011  . Endometriosis   . 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 Osteoarthrosis, unspecified whether generalized or localized, unspecified site 08/20/2006  . Osteoporosis   . Other abnormal blood chemistry 03/14/2011  . Other and unspecified hyperlipidemia 10/18/2010  . Palpitations 02/13/2012  . Postmenopausal bleeding 08/30/2010  . Shortness of breath 11/28/2011  . Supraventricular premature beats 05/23/2011  . Unspecified essential hypertension 08/20/2006    Patient Active Problem List   Diagnosis Date Noted  . Vaginal atrophy 01/15/2019  . Cyst of right kidney 07/27/2017  . Knee pain, chronic 12/26/2016  . Unstable gait 07/04/2016  . COPD  (chronic obstructive pulmonary disease) ([place]) 08/04/2014  . Palpitations 02/13/2012  . Hyperglycemia 03/14/2011  . Hyperlipidemia 10/18/2010  . Disorder of bone and cartilage, unspecified 02/18/2007  . Essential hypertension 08/20/2006  . Osteoarthritis 08/20/2006    Past Surgical History:  Procedure Laterality Date  . CARPAL TUNNEL RELEASE  2004   S. Norris MD  . CATARACT EXTRACTION W/ INTRAOCULAR LENS IMPLANT Right 2010  . CATARACT EXTRACTION W/ INTRAOCULAR LENS IMPLANT Left 10/2012  . DILATION AND CURETTAGE OF UTERUS    . EYE SURGERY Right 07/2009   cataract extraction/IOLI [name], MD  . KNEE ARTHROSCOPY Right 2008   Torn meniscus, Dr. Veverly Fells     OB History    Gravida  3   Para  3   Term  3   Preterm      AB      Living  3     SAB      TAB      Ectopic      Multiple      Live Births  3           Family History  Problem Relation Age of Onset  . Diabetes Brother   . COPD Brother     Social History   Tobacco Use  . Smoking status: Former Smoker    Quit date: 02/11/1978    Years since quitting: 41.9  . Smokeless tobacco: Never Used  Substance Use Topics  . Alcohol use: Yes  . Drug use: No  Home Medications Prior to Admission medications   Medication Sig Start Date End Date Taking? Authorizing Provider  aspirin EC 81 MG tablet Take 1 tablet (81 mg total) by mouth daily. 07/18/17  Yes Blanchie Serve, MD  losartan-hydrochlorothiazide (HYZAAR) 100-25 MG tablet TAKE ONE TABLET BY MOUTH DAILY TO CONTROL FOR BLOOD PRESSURE Patient taking differently: Take 1 tablet by mouth daily.  10/31/19  Yes [name], [name], NP  metoprolol tartrate (LOPRESSOR) 50 MG tablet TAKE 1 TABLET BY MOUTH  TWICE A DAY TO HELP HEART  RHYTHM AND BLOOD PRESSURE Patient taking differently: Take 50 mg by mouth 2 (two) times daily.  12/15/19  Yes [name], [name], NP  naproxen (NAPROSYN) 250 MG tablet Take 250 mg by mouth 2 (two) times daily with a meal.   Yes [provider]    potassium chloride SA (KLOR-CON) 20 MEQ tablet Take 20 mEq by mouth 3 (three) times daily. Take x7 days and then take 1 qd 01/15/20  Yes [provider]    Allergies    Lomotil [diphenoxylate], Sulfa antibiotics, and Amlodipine  Review of Systems   Review of Systems 10 systems reviewed and negative except as per HPI. Physical Exam Updated Vital Signs BP 134/75   Pulse (!) 126   Temp 98.1 F (36.7 C) (Oral)   Resp 20   Ht 5\' 2"  (1.575 m)   Wt 45 kg   SpO2 97%   BMI 18.15 kg/m   Physical Exam Constitutional:      Appearance: Normal appearance.  HENT:     Head: Normocephalic and atraumatic.     Mouth/Throat:     Pharynx: Oropharynx is clear.  Eyes:     Extraocular Movements: Extraocular movements intact.  Cardiovascular:     Comments: Tachycardia, irregularly irregular. Pulmonary:     Effort: Pulmonary effort is normal.     Breath sounds: Normal breath sounds.  Abdominal:     General: There is no distension.     Palpations: Abdomen is soft.     Tenderness: There is no abdominal tenderness. There is no guarding.  Musculoskeletal:        General: No swelling or tenderness. Normal range of motion.     Right lower leg: No edema.     Left lower leg: No edema.  Skin:    General: Skin is warm and dry.  Neurological:     General: No focal deficit present.     Mental Status: She is alert and oriented to person, place, and time.     Coordination: Coordination normal.     Gait: Gait normal.  Psychiatric:        Mood and Affect: Mood normal.     ED Results / Procedures / Treatments   Labs (all labs ordered are listed, but only abnormal results are displayed) Labs Reviewed  COMPREHENSIVE METABOLIC PANEL - Abnormal; Notable for the following components:      Result Value   Sodium 133 (*)    Potassium 2.6 (*)    Chloride 90 (*)    Glucose, Bld 121 (*)    BUN 37 (*)    Calcium 8.1 (*)    Total Protein 5.8 (*)    Albumin 3.0 (*)    AST 14 (*)    GFR calc  non Af Amer 57 (*)    All other components within normal limits  CBC WITH DIFFERENTIAL/PLATELET - Abnormal; Notable for the following components:   Hemoglobin 11.5 (*)    HCT 35.8 (*)  Neutro Abs 8.6 (*)    All other components within normal limits  GASTROINTESTINAL PANEL BY PCR, STOOL (REPLACES STOOL CULTURE)  C DIFFICILE QUICK SCREEN W PCR REFLEX  SARS CORONAVIRUS 2 BY RT PCR (HOSPITAL ORDER, [facility])  PROTIME-INR  URINALYSIS, ROUTINE W REFLEX MICROSCOPIC    EKG None EKG not uploaded yet into MUSE. Atrial fibrillation rate of 131 QRS 78 QTC 457 no acute STEMI pattern.  Diffuse lateral T wave inversion.  Old comparison EKG is 2 years earlier sinus rhythm this EKG changes of rapid A. fib and inferior lateral ST depression.  Radiology No results found.  Procedures Procedures (including critical care time) CRITICAL CARE Performed by: Charlesetta Shanks   Total critical care time: 45 minutes  Critical care time was exclusive of separately billable procedures and treating other patients.  Critical care was necessary to treat or prevent imminent or life-threatening deterioration.  Critical care was time spent personally by me on the following activities: development of treatment plan with patient and/or surrogate as well as nursing, discussions with consultants, evaluation of patient's response to treatment, examination of patient, obtaining history from patient or surrogate, ordering and performing treatments and interventions, ordering and review of laboratory studies, ordering and review of radiographic studies, pulse oximetry and re-evaluation of patient's condition.  Medications Ordered in ED Medications  potassium chloride 10 mEq in 100 mL IVPB (10 mEq Intravenous New Bag/Given 01/18/20 1511)  sodium chloride 0.9 % bolus 500 mL (500 mLs Intravenous New Bag/Given 01/18/20 1507)    Followed by  0.9 %  sodium chloride infusion (has no administration  in time range)  metoprolol tartrate (LOPRESSOR) injection 5 mg (has no administration in time range)  potassium chloride 20 MEQ/15ML (10%) solution 40 mEq (40 mEq Oral Given 01/18/20 1501)    ED Course  I have reviewed the triage vital signs and the nursing notes.  Pertinent labs & imaging results that were available during my care of the patient were reviewed by me and considered in my medical decision making (see chart for details).    MDM Rules/Calculators/A&P                      Patient is a healthy 86 year old female.  She has had several weeks of diarrhea.  No fevers or abdominal pain.  Denies recent antibiotic use.  Patient has become hypokalemic.  She does have known atrial fibrillation rates are as high as the 130s.  Suspect rates are increased with some exacerbation due to mild dehydration.  This time with potassium at 2.4 and tachycardia we will plan for admission for hydration and potassium replacement.  Stool studies have been ordered.  At this time, patient does not appear to have high risk for C. difficile.  She has had her Covid vaccine.  No new medications.   Patient treated with fluid resuscitation and potassium replacement.  Lopressor 5 mg ordered for atrial fibrillation rapid ventricular spots.  At this time patient is asymptomatic with her A. fib.  She is alert and appropriate, no respiratory distress. Patient will be admitted for ongoing treatment.  Consult: Dr. Jerilee Hoh for admission Final Clinical Impression(s) / ED Diagnoses Final diagnoses:  Diarrhea, unspecified type  Dehydration  Hypokalemia  Atrial fibrillation with rapid ventricular response [facility])    Rx / DC Orders ED Discharge Orders    None       Charlesetta Shanks, MD 01/18/20 [phone]

## 2020-01-18 NOTE — H&P (Signed)
History and Physical    Terri Acosta [id number] DOB: 1923-02-14 DOA: 01/18/2020  Referring MD/NP/PA: Charlesetta Shanks, EDP PCP: [name], [name], NP  Patient coming from: [facility] living facility  Chief Complaint: Diarrhea, palpitations  HPI: Terri Acosta is a 86 y.o. female who has a history of paroxysmal atrial fibrillation not on chronic anticoagulation for unknown reasons (question if due to age), she also has a history of hyperlipidemia and hypertension.  She is in excellent health and in very good shape for her age.  [facility] [place] in the independent living section.  Until recently she was an avid golfer.  She states that for the past 2 to 3 weeks she has been having diarrhea.  She describes the diarrhea as brown in color and like water, she has about 5-6 episodes a day.  Today has only had 2 so far.  Over the past week or so she has become progressively weak.  She denies fever, abdominal pain, nausea, vomiting.  She had some labs drawn at her facility and had a potassium of 2.4 that is being replaced.  Because of continued diarrhea and weakness, she decided to come to the emergency department today.  Here she was found to be normotensive but tachycardic with a heart rate as high as 130s in atrial fibrillation, she is afebrile, labs are significant for potassium of 2.6, she has a normal WBC count.  Stool studies including C. difficile have been ordered and admission was requested for further evaluation and management.   Past Medical/Surgical History: Past Medical History:  Diagnosis Date  . Abnormal uterine bleeding   . Actinic keratosis 02/13/2012  . Acute bronchospasm 10/31/2011  . Cancer ([place]) 04/2008   Skin cancer of low back Terri Jews, MD  . Cataract   . Disorder of bone and cartilage, unspecified 02/18/2007  . Dizziness and giddiness 08/30/2010  . Dysmenorrhea   . Edema 12/19/2011  . Endometriosis   . 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 Osteoarthrosis,  unspecified whether generalized or localized, unspecified site 08/20/2006  . Osteoporosis   . Other abnormal blood chemistry 03/14/2011  . Other and unspecified hyperlipidemia 10/18/2010  . Palpitations 02/13/2012  . Postmenopausal bleeding 08/30/2010  . Shortness of breath 11/28/2011  . Supraventricular premature beats 05/23/2011  . Unspecified essential hypertension 08/20/2006    Past Surgical History:  Procedure Laterality Date  . CARPAL TUNNEL RELEASE  2004   S. Norris MD  . CATARACT EXTRACTION W/ INTRAOCULAR LENS IMPLANT Right 2010  . CATARACT EXTRACTION W/ INTRAOCULAR LENS IMPLANT Left 10/2012  . DILATION AND CURETTAGE OF UTERUS    . EYE SURGERY Right 07/2009   cataract extraction/IOLI Terri Lunch, MD  . KNEE ARTHROSCOPY Right 2008   Torn meniscus, Dr. Veverly Fells    Social History:  reports that she quit smoking about 41 years ago. She has never used smokeless tobacco. She reports current alcohol use. She reports that she does not use drugs.  Allergies: Allergies  Allergen Reactions  . Lomotil [Diphenoxylate] Nausea And Vomiting  . Sulfa Antibiotics Other (See Comments)    Unknown   . Amlodipine Swelling    Family History:  Family History  Problem Relation Age of Onset  . Diabetes Brother   . COPD Brother     Prior to Admission medications   Medication Sig Start Date End Date Taking? Authorizing Provider  aspirin EC 81 MG tablet Take 1 tablet (81 mg total) by mouth daily. 07/18/17  Yes Blanchie Serve, MD  losartan-hydrochlorothiazide (HYZAAR) 100-25 MG tablet TAKE ONE TABLET BY MOUTH DAILY TO CONTROL FOR BLOOD PRESSURE Patient taking differently: Take 1 tablet by mouth daily.  10/31/19  Yes [name], [name], NP  metoprolol tartrate (LOPRESSOR) 50 MG tablet TAKE 1 TABLET BY MOUTH  TWICE A DAY TO HELP HEART  RHYTHM AND BLOOD PRESSURE Patient taking differently: Take 50 mg by mouth 2 (two) times daily.  12/15/19  Yes [name], [name], NP  naproxen (NAPROSYN) 250 MG tablet Take 250 mg by  mouth 2 (two) times daily with a meal.   Yes [provider]  potassium chloride SA (KLOR-CON) 20 MEQ tablet Take 20 mEq by mouth 3 (three) times daily. Take x7 days and then take 1 qd 01/15/20  Yes [provider]    Review of Systems:  Constitutional: Denies fever, chills, diaphoresis, appetite change and fatigue.  HEENT: Denies photophobia, eye pain, redness, hearing loss, ear pain, congestion, sore throat, rhinorrhea, sneezing, mouth sores, trouble swallowing, neck pain, neck stiffness and tinnitus.   Respiratory: Denies SOB, DOE, cough, chest tightness,  and wheezing.   Cardiovascular: Denies chest pain, palpitations and leg swelling.  Gastrointestinal: Denies nausea, vomiting, abdominal pain,  constipation, blood in stool and abdominal distention.  Genitourinary: Denies dysuria, urgency, frequency, hematuria, flank pain and difficulty urinating.  Endocrine: Denies: hot or cold intolerance, sweats, changes in hair or nails, polyuria, polydipsia. Musculoskeletal: Denies myalgias, back pain, joint swelling, arthralgias and gait problem.  Skin: Denies pallor, rash and wound.  Neurological: Denies dizziness, seizures, syncope,  light-headedness, numbness and headaches.  Hematological: Denies adenopathy. Easy bruising, personal or family bleeding history  Psychiatric/Behavioral: Denies suicidal ideation, mood changes, confusion, nervousness, sleep disturbance and agitation    Physical Exam: Vitals:   01/18/20 1330 01/18/20 1400 01/18/20 1430 01/18/20 1500  BP: 128/90 125/89 125/80 134/75  Pulse:    (!) 126  Resp: 13 16 16 20   Temp:      TempSrc:      SpO2:    97%  Weight:      Height:         Constitutional: NAD, calm, comfortable, very pleasant and conversant Eyes: PERRL, lids and conjunctivae normal, wears corrective lenses ENMT: Mucous membranes are dry.   Neck: normal, supple, no masses, no thyromegaly Respiratory: clear to auscultation bilaterally, no  wheezing, no crackles. Normal respiratory effort. No accessory muscle use.  Cardiovascular: Tachycardic, irregular rhythm, no murmurs / rubs / gallops. No extremity edema. 2+ pedal pulses. No carotid bruits.  Abdomen: no tenderness, no masses palpated. No hepatosplenomegaly. Bowel sounds positive.  Musculoskeletal: no clubbing / cyanosis. No joint deformity upper and lower extremities. Good ROM, no contractures. Normal muscle tone.  Skin: no rashes, lesions, ulcers. No induration Neurologic: CN 2-12 grossly intact. Sensation intact, DTR normal. Strength 5/5 in all 4.  Psychiatric: Normal judgment and insight. Alert and oriented x 3. Normal mood.    Labs on Admission: I have personally reviewed the following labs and imaging studies  CBC: Recent Labs  Lab 01/15/20 0840 01/18/20 1302  WBC 9.9 10.2  NEUTROABS 7,544 8.6*  HGB 11.8 11.5*  HCT 35.4 35.8*  MCV 84.5 87.1  PLT 325 [id number]   Basic Metabolic Panel: Recent Labs  Lab 01/15/20 0840 01/18/20 1302  NA 135 133*  K 2.5* 2.6*  CL 93* 90*  CO2 31 31  GLUCOSE 112* 121*  BUN 27* 37*  CREATININE 0.78 0.87  CALCIUM 8.5* 8.1*   GFR: Estimated Creatinine Clearance: 28.1 mL/min (by  C-G formula based on SCr of 0.87 mg/dL). Liver Function Tests: Recent Labs  Lab 01/15/20 0840 01/18/20 1302  AST 12 14*  ALT 7 10  ALKPHOS  --  47  BILITOT 0.7 1.0  PROT 5.9* 5.8*  ALBUMIN  --  3.0*   No results for input(s): LIPASE, AMYLASE in the last 168 hours. No results for input(s): AMMONIA in the last 168 hours. Coagulation Profile: Recent Labs  Lab 01/18/20 1302  INR 1.1   Cardiac Enzymes: No results for input(s): CKTOTAL, CKMB, CKMBINDEX, TROPONINI in the last 168 hours. BNP (last 3 results) No results for input(s): PROBNP in the last 8760 hours. HbA1C: No results for input(s): HGBA1C in the last 72 hours. CBG: No results for input(s): GLUCAP in the last 168 hours. Lipid Profile: No results for input(s): CHOL, HDL, LDLCALC,  TRIG, CHOLHDL, LDLDIRECT in the last 72 hours. Thyroid Function Tests: No results for input(s): TSH, T4TOTAL, FREET4, T3FREE, THYROIDAB in the last 72 hours. Anemia Panel: No results for input(s): VITAMINB12, FOLATE, FERRITIN, TIBC, IRON, RETICCTPCT in the last 72 hours. Urine analysis:    Component Value Date/Time   COLORURINE YELLOW 06/06/2018 0000   APPEARANCEUR CLEAR 06/06/2018 0000   LABSPEC 1.010 06/06/2018 0000   PHURINE 7.5 06/06/2018 0000   GLUCOSEU NEGATIVE 06/06/2018 0000   HGBUR TRACE (A) 06/06/2018 0000   BILIRUBINUR negative 07/02/2018 1700   KETONESUR NEGATIVE 06/06/2018 0000   PROTEINUR Negative 07/02/2018 1700   PROTEINUR TRACE (A) 06/06/2018 0000   UROBILINOGEN 0.2 07/02/2018 1700   NITRITE negative 07/02/2018 1700   NITRITE POSITIVE (A) 06/06/2018 0000   LEUKOCYTESUR Negative 07/02/2018 1700   Sepsis Labs: @LABRCNTIP (procalcitonin:4,lacticidven:4) )No results found for this or any previous visit (from the past 240 hour(s)).   Radiological Exams on Admission: No results found.  EKG: Independently reviewed.  Atrial fibrillation at a rate of 131, no discernible acute ischemic changes  Assessment/Plan Principal Problem:   Diarrhea Active Problems:   Hypokalemia   Hyperlipidemia   Essential hypertension   Atrial fibrillation with RVR (HCC)    Diarrhea -Stool studies have been requested including C. difficile.  As of yet, she has not been able to produce a sample. -Admit as observation mainly for IV fluid and potassium replacement.  She does appear mildly dehydrated on exam with dry mucous membranes (cracked lips and tongue) and poor skin turgor. -Given lack of abdominal findings, do not believe CT scan is necessary at this time.  Atrial fibrillation with RVR -She is noted to have A. fib, she is on metoprolol for rate control which I will continue. -She is not anticoagulated I wonder if due to her advanced age. -Start aspirin 81 mg daily. -I suspect her  rapid rates have been precipitated by dehydration from excessive GI losses.  Hypokalemia -Due to GI losses. -Replace orally and check magnesium levels.   DVT prophylaxis: Subcutaneous Lovenox Code Status: Full code Family Communication: Patient only Disposition Plan: Observe in telemetry, anticipate discharge back to independent living facility over the next 24 to 48 hours after IV fluid and potassium replacement. Consults called: None Admission status: It is my clinical opinion that referral for OBSERVATION is reasonable and necessary in this patient based on the above information provided. The aforementioned taken together are felt to place the patient at high risk for further clinical deterioration. However it is anticipated that the patient may be medically stable for discharge from the hospital within 24 to 48 hours.     Time Spent: 75  minutes  Lelon Frohlich MD [facility] Pager [phone]  If 7PM-7AM, please contact night-coverage [email or web address] Password [facility]  01/18/2020, 4:42 PM

## 2020-01-18 NOTE — ED Notes (Signed)
Date and time results received: 01/18/20 2:17 PM  (use smartphrase ".now" to insert current time)  Test: potassium Critical Value: 2.6   Name of Provider Notified: [email or web address]  Orders Received? Or Actions Taken?: Actions Taken: notified DR

## 2020-01-18 NOTE — ED Notes (Signed)
Call received from pt daughter/POA Maryagnes Amos [phone] requesting rtn call for pt status and updates when possible. RN advised. [employer]

## 2020-01-18 NOTE — ED Triage Notes (Addendum)
Pt to ed with c/o of diarrhea for 2 weeks. No other complaints from [facility] or facility.

## 2020-01-18 NOTE — ED Notes (Signed)
Call received from pt daughter Maryagnes Amos [phone] requesting rtn call for pt status/updates. RN advised. [employer]

## 2020-01-18 NOTE — ED Notes (Signed)
Pt unable to void and have a bm; will continue to monitor

## 2020-01-19 ENCOUNTER — Observation Stay (HOSPITAL_COMMUNITY): Payer: Medicare Other

## 2020-01-19 ENCOUNTER — Inpatient Hospital Stay (HOSPITAL_COMMUNITY): Payer: Medicare Other

## 2020-01-19 DIAGNOSIS — Z882 Allergy status to sulfonamides status: Secondary | ICD-10-CM | POA: Diagnosis not present

## 2020-01-19 DIAGNOSIS — I4891 Unspecified atrial fibrillation: Secondary | ICD-10-CM | POA: Diagnosis not present

## 2020-01-19 DIAGNOSIS — I10 Essential (primary) hypertension: Secondary | ICD-10-CM | POA: Diagnosis present

## 2020-01-19 DIAGNOSIS — Z9841 Cataract extraction status, right eye: Secondary | ICD-10-CM | POA: Diagnosis not present

## 2020-01-19 DIAGNOSIS — E876 Hypokalemia: Secondary | ICD-10-CM | POA: Diagnosis present

## 2020-01-19 DIAGNOSIS — K529 Noninfective gastroenteritis and colitis, unspecified: Secondary | ICD-10-CM | POA: Diagnosis present

## 2020-01-19 DIAGNOSIS — I482 Chronic atrial fibrillation, unspecified: Secondary | ICD-10-CM | POA: Diagnosis present

## 2020-01-19 DIAGNOSIS — Z79899 Other long term (current) drug therapy: Secondary | ICD-10-CM | POA: Diagnosis not present

## 2020-01-19 DIAGNOSIS — Z20822 Contact with and (suspected) exposure to covid-19: Secondary | ICD-10-CM | POA: Diagnosis present

## 2020-01-19 DIAGNOSIS — D649 Anemia, unspecified: Secondary | ICD-10-CM | POA: Diagnosis present

## 2020-01-19 DIAGNOSIS — Z833 Family history of diabetes mellitus: Secondary | ICD-10-CM | POA: Diagnosis not present

## 2020-01-19 DIAGNOSIS — R000 Tachycardia, unspecified: Secondary | ICD-10-CM | POA: Diagnosis present

## 2020-01-19 DIAGNOSIS — Z87891 Personal history of nicotine dependence: Secondary | ICD-10-CM | POA: Diagnosis not present

## 2020-01-19 DIAGNOSIS — Z791 Long term (current) use of non-steroidal anti-inflammatories (NSAID): Secondary | ICD-10-CM | POA: Diagnosis not present

## 2020-01-19 DIAGNOSIS — E7849 Other hyperlipidemia: Secondary | ICD-10-CM | POA: Diagnosis present

## 2020-01-19 DIAGNOSIS — Z7982 Long term (current) use of aspirin: Secondary | ICD-10-CM | POA: Diagnosis not present

## 2020-01-19 DIAGNOSIS — I340 Nonrheumatic mitral (valve) insufficiency: Secondary | ICD-10-CM | POA: Diagnosis not present

## 2020-01-19 DIAGNOSIS — Z961 Presence of intraocular lens: Secondary | ICD-10-CM | POA: Diagnosis present

## 2020-01-19 DIAGNOSIS — R197 Diarrhea, unspecified: Secondary | ICD-10-CM | POA: Diagnosis present

## 2020-01-19 DIAGNOSIS — Z66 Do not resuscitate: Secondary | ICD-10-CM | POA: Diagnosis present

## 2020-01-19 DIAGNOSIS — Z888 Allergy status to other drugs, medicaments and biological substances status: Secondary | ICD-10-CM | POA: Diagnosis not present

## 2020-01-19 DIAGNOSIS — Z85828 Personal history of other malignant neoplasm of skin: Secondary | ICD-10-CM | POA: Diagnosis not present

## 2020-01-19 DIAGNOSIS — Z9842 Cataract extraction status, left eye: Secondary | ICD-10-CM | POA: Diagnosis not present

## 2020-01-19 DIAGNOSIS — Z825 Family history of asthma and other chronic lower respiratory diseases: Secondary | ICD-10-CM | POA: Diagnosis not present

## 2020-01-19 DIAGNOSIS — I480 Paroxysmal atrial fibrillation: Secondary | ICD-10-CM | POA: Diagnosis present

## 2020-01-19 DIAGNOSIS — M810 Age-related osteoporosis without current pathological fracture: Secondary | ICD-10-CM | POA: Diagnosis present

## 2020-01-19 DIAGNOSIS — K7460 Unspecified cirrhosis of liver: Secondary | ICD-10-CM | POA: Diagnosis present

## 2020-01-19 LAB — CBC
HCT: 36.1 % (ref 36.0–46.0)
Hemoglobin: 11.4 g/dL — ABNORMAL LOW (ref 12.0–15.0)
MCH: 27.8 pg (ref 26.0–34.0)
MCHC: 31.6 g/dL (ref 30.0–36.0)
MCV: 88 fL (ref 80.0–100.0)
Platelets: 339 10*3/uL (ref 150–400)
RBC: 4.1 MIL/uL (ref 3.87–5.11)
RDW: 13.8 % (ref 11.5–15.5)
WBC: 9.8 10*3/uL (ref 4.0–10.5)
nRBC: 0 % (ref 0.0–0.2)

## 2020-01-19 LAB — BASIC METABOLIC PANEL
Anion gap: 11 (ref 5–15)
BUN: 30 mg/dL — ABNORMAL HIGH (ref 8–23)
CO2: 27 mmol/L (ref 22–32)
Calcium: 8.1 mg/dL — ABNORMAL LOW (ref 8.9–10.3)
Chloride: 99 mmol/L (ref 98–111)
Creatinine, Ser: 0.89 mg/dL (ref 0.44–1.00)
GFR calc Af Amer: >60 mL/min (ref >60)
GFR calc non Af Amer: 55 mL/min — ABNORMAL LOW (ref >60)
Glucose, Bld: 125 mg/dL — ABNORMAL HIGH (ref 70–99)
Potassium: 4 mmol/L (ref 3.5–5.1)
Sodium: 137 mmol/L (ref 135–145)

## 2020-01-19 LAB — URINALYSIS, ROUTINE W REFLEX MICROSCOPIC
Bilirubin Urine: NEGATIVE
Glucose, UA: NEGATIVE mg/dL
Ketones, ur: NEGATIVE mg/dL
Nitrite: NEGATIVE
Protein, ur: 30 mg/dL — AB
Specific Gravity, Urine: 1.018 (ref 1.005–1.030)
WBC, UA: >50 WBC/hpf — ABNORMAL HIGH (ref 0–5)
pH: 5 (ref 5.0–8.0)

## 2020-01-19 LAB — C DIFFICILE QUICK SCREEN W PCR REFLEX
C Diff antigen: NEGATIVE
C Diff interpretation: NOT DETECTED
C Diff toxin: NEGATIVE

## 2020-01-19 LAB — TSH: TSH: 2.195 u[IU]/mL (ref 0.350–4.500)

## 2020-01-19 LAB — T4, FREE: Free T4: 1.35 ng/dL — ABNORMAL HIGH (ref 0.61–1.12)

## 2020-01-19 MED ORDER — METOPROLOL TARTRATE 5 MG/5ML IV SOLN: 2.5000 mg | Freq: Three times a day (TID) | INTRAVENOUS | Status: DC | PRN

## 2020-01-19 MED ORDER — MAGNESIUM SULFATE 2 GM/50ML IV SOLN
2.0000 g | Freq: Once | INTRAVENOUS | Status: AC
  Administered 2020-01-19: 2 g via INTRAVENOUS
  Filled 2020-01-19: qty 50

## 2020-01-19 MED ORDER — METOPROLOL TARTRATE 5 MG/5ML IV SOLN
2.5000 mg | Freq: Once | INTRAVENOUS | Status: AC
  Administered 2020-01-19: 2.5 mg via INTRAVENOUS
  Filled 2020-01-19: qty 5

## 2020-01-19 NOTE — Progress Notes (Signed)
PROGRESS NOTE  Terri Acosta [id number] DOB: Dec 15, 1922 DOA: 01/18/2020 PCP: [name], [name], NP  HPI/Recap of past 24 hours: HPI from Dr Terri Acosta is a 86 y.o. female who has a history of paroxysmal atrial fibrillation not on chronic anticoagulation for unknown reasons (question if due to age), hypertension. [facility] [place] in [facility] independent living section. She states that for the past 2 to 3 weeks she has been having diarrhea.  She describes the diarrhea as brown in color and like water, she has about 5-6 episodes a day. Over the past week or so she has become progressively weak.  She denies fever, abdominal pain, nausea, vomiting.  She had some labs drawn at her facility and had a potassium of 2.4 that is being replaced.  Because of continued diarrhea and weakness, she decided to come to the ED. Pt was found to be normotensive but tachycardic with a heart rate as high as 130s in atrial fibrillation, she is afebrile, labs are significant for potassium of 2.6, she has a normal WBC count.  Stool studies negative for C. Difficile. Pt admitted for further evaluation and management.    Today, patient still reports having watery diarrhea about 6 episodes so far today, continues to deny any abdominal pain, nausea/vomiting, fever/chills, chest pain, shortness of breath.    Assessment/Plan: Principal Problem:   Diarrhea Active Problems:   Hyperlipidemia   Essential hypertension   Atrial fibrillation with rapid ventricular response (HCC)   Hypokalemia   Acute gastroenteritis Likely ??viral  Vs non infectious Afebrile, with no leukocytosis LFTs WNL C.diff negative, other stool panel pending Abd xray pending Continue IVF, supportive care  Hypokalemia/hypomagnesemia Replace prn  Afib with RVR HR uncontrolled Likely 2/2 above TSH WNL, free T4 1.35, plan to repeat as outpt CXR hyperinflated lung, otherwise unremarkable  ECHO pending Continue home metoprolol  with IV prn, IVF Not on any AC, continue home ASA Telemetry  HTN BP soft Hold home hyzaar        Malnutrition Type:      Malnutrition Characteristics:      Nutrition Interventions:       Estimated body mass index is 18.67 kg/m as calculated from the following:   Height as of this encounter: 5\' 2"  (1.575 m).   Weight as of this encounter: 46.3 kg.     Code Status: Full  Family Communication: Discussed with patient, plan to update family  Disposition Plan: Status is: Observation  The patient will require care spanning > 2 midnights and should be moved to inpatient because: Inpatient level of care appropriate due to severity of illness  Dispo: The patient is from: Independent living facility               Anticipated d/c is to: ILF pending PT/OT              Anticipated d/c date is: 1 day              Patient currently is not medically stable to d/c.   Consultants:  None  Procedures:  None  Antimicrobials:  None  DVT prophylaxis: Lovenox   Objective: Vitals:   01/19/20 0112 01/19/20 0507 01/19/20 0808 01/19/20 1018  BP: (!) 128/113 (!) 113/93 128/85 110/86  Pulse: (!) 101  (!) 118 (!) 110  Resp: 18 20 18 18   Temp: 97.7 F (36.5 C) 98.6 F (37 C) 97.7 F (36.5 C)   TempSrc: Oral Oral  SpO2: 100% 98% 98%   Weight:      Height:        Intake/Output Summary (Last 24 hours) at 01/19/2020 1147 Last data filed at 01/19/2020 0200 Gross per 24 hour  Intake 1286.04 ml  Output --  Net 1286.04 ml   Filed Weights   01/18/20 1114 01/18/20 1710  Weight: 45 kg 46.3 kg    Exam:  General: NAD, elderly, frail, oriented    Cardiovascular: S1, S2 present  Respiratory: CTAB  Abdomen: Soft, nontender, nondistended, bowel sounds present  Musculoskeletal: No bilateral pedal edema noted  Skin: Normal  Psychiatry: Normal mood    Data Reviewed: CBC: Recent Labs  Lab 01/15/20 0840 01/18/20 1302 01/19/20 0549  WBC 9.9 10.2 9.8   NEUTROABS 7,544 8.6*  --   HGB 11.8 11.5* 11.4*  HCT 35.4 35.8* 36.1  MCV 84.5 87.1 88.0  PLT 325 327 [id number]   Basic Metabolic Panel: Recent Labs  Lab 01/15/20 0840 01/18/20 1302 01/18/20 1730 01/19/20 0549  NA 135 133*  --  137  K 2.5* 2.6*  --  4.0  CL 93* 90*  --  99  CO2 31 31  --  27  GLUCOSE 112* 121*  --  125*  BUN 27* 37*  --  30*  CREATININE 0.78 0.87  --  0.89  CALCIUM 8.5* 8.1*  --  8.1*  MG  --   --  1.6*  --    GFR: Estimated Creatinine Clearance: 28.3 mL/min (by C-G formula based on SCr of 0.89 mg/dL). Liver Function Tests: Recent Labs  Lab 01/15/20 0840 01/18/20 1302  AST 12 14*  ALT 7 10  ALKPHOS  --  47  BILITOT 0.7 1.0  PROT 5.9* 5.8*  ALBUMIN  --  3.0*   No results for input(s): LIPASE, AMYLASE in the last 168 hours. No results for input(s): AMMONIA in the last 168 hours. Coagulation Profile: Recent Labs  Lab 01/18/20 1302  INR 1.1   Cardiac Enzymes: No results for input(s): CKTOTAL, CKMB, CKMBINDEX, TROPONINI in the last 168 hours. BNP (last 3 results) No results for input(s): PROBNP in the last 8760 hours. HbA1C: No results for input(s): HGBA1C in the last 72 hours. CBG: No results for input(s): GLUCAP in the last 168 hours. Lipid Profile: No results for input(s): CHOL, HDL, LDLCALC, TRIG, CHOLHDL, LDLDIRECT in the last 72 hours. Thyroid Function Tests: Recent Labs    01/19/20 0820 01/19/20 0859  TSH 2.195  --   FREET4  --  1.35*   Anemia Panel: No results for input(s): VITAMINB12, FOLATE, FERRITIN, TIBC, IRON, RETICCTPCT in the last 72 hours. Urine analysis:    Component Value Date/Time   COLORURINE YELLOW 06/06/2018 0000   APPEARANCEUR CLEAR 06/06/2018 0000   LABSPEC 1.010 06/06/2018 0000   PHURINE 7.5 06/06/2018 0000   GLUCOSEU NEGATIVE 06/06/2018 0000   HGBUR TRACE (A) 06/06/2018 0000   BILIRUBINUR negative 07/02/2018 1700   KETONESUR NEGATIVE 06/06/2018 0000   PROTEINUR Negative 07/02/2018 1700   PROTEINUR TRACE (A)  06/06/2018 0000   UROBILINOGEN 0.2 07/02/2018 1700   NITRITE negative 07/02/2018 1700   NITRITE POSITIVE (A) 06/06/2018 0000   LEUKOCYTESUR Negative 07/02/2018 1700   Sepsis Labs: @LABRCNTIP (procalcitonin:4,lacticidven:4)  ) Recent Results (from the past 240 hour(s))  SARS Coronavirus 2 by RT PCR (hospital order, performed in [place] hospital lab) Nasopharyngeal Nasopharyngeal Swab     Status: None   Collection Time: 01/18/20  2:56 PM   Specimen: Nasopharyngeal Swab  Result Value Ref Range Status   SARS Coronavirus 2 NEGATIVE NEGATIVE Final    Comment: (NOTE) SARS-CoV-2 target nucleic acids are NOT DETECTED. The SARS-CoV-2 RNA is generally detectable in upper and lower respiratory specimens during the acute phase of infection. The lowest concentration of SARS-CoV-2 viral copies this assay can detect is 250 copies / mL. A negative result does not preclude SARS-CoV-2 infection and should not be used as the sole basis for treatment or other patient management decisions.  A negative result may occur with improper specimen collection / handling, submission of specimen other than nasopharyngeal swab, presence of viral mutation(s) within the areas targeted by this assay, and inadequate number of viral copies (<250 copies / mL). A negative result must be combined with clinical observations, patient history, and epidemiological information. Fact Sheet for Patients:   StrictlyIdeas.no Fact Sheet for Healthcare Providers: BankingDealers.co.za This test is not yet approved or cleared  by the [place] FDA and has been authorized for detection and/or diagnosis of SARS-CoV-2 by FDA under an Emergency Use Authorization (EUA).  This EUA will remain in effect (meaning this test can be used) for the duration of the COVID-19 declaration under Section 564(b)(1) of the Act, 21 U.S.C. section 360bbb-3(b)(1), unless the authorization is terminated  or revoked sooner. Performed at [facility], [place] [address], [place], [place] [address]   C Difficile Quick Screen w PCR reflex     Status: None   Collection Time: 01/19/20  3:00 AM   Specimen: STOOL  Result Value Ref Range Status   C Diff antigen NEGATIVE NEGATIVE Final   C Diff toxin NEGATIVE NEGATIVE Final   C Diff interpretation No C. difficile detected.  Final    Comment: Performed at [facility], [place] [address], [place], [place] [address]      Studies: DG Chest Port 1 View  Result Date: 01/19/2020 CLINICAL DATA:  A-fib, dehydration; EXAM: PORTABLE CHEST 1 VIEW COMPARISON:  Radiograph 11/03/2010 FINDINGS: Normal cardiac silhouette. Lungs are hyperinflated. No effusion, infiltrate pneumothorax. No acute osseous abnormality. IMPRESSION: Hyperinflated lungs.  No acute findings. Electronically Signed   By: Suzy Bouchard M.D.   On: 01/19/2020 09:06    Scheduled Meds: . aspirin EC  81 mg Oral Daily  . enoxaparin (LOVENOX) injection  30 mg Subcutaneous Q24H  . metoprolol tartrate  50 mg Oral BID    Continuous Infusions: . sodium chloride 100 mL/hr at 01/19/20 0843     LOS: 1 day     Alma Friendly, MD [facility]  If 7PM-7AM, please contact night-coverage [email or web address] 01/19/2020, 11:47 AM

## 2020-01-19 NOTE — Progress Notes (Signed)
  Echocardiogram 2D Echocardiogram has been performed.  Terri Acosta 01/19/2020, 5:00 PM

## 2020-01-19 NOTE — Procedures (Signed)
Heart rate is too high for accurate echo at this time. 

## 2020-01-20 ENCOUNTER — Inpatient Hospital Stay (HOSPITAL_COMMUNITY): Payer: Medicare Other

## 2020-01-20 ENCOUNTER — Encounter (HOSPITAL_COMMUNITY): Payer: Self-pay | Admitting: Internal Medicine

## 2020-01-20 DIAGNOSIS — K529 Noninfective gastroenteritis and colitis, unspecified: Principal | ICD-10-CM

## 2020-01-20 LAB — BASIC METABOLIC PANEL
Anion gap: 8 (ref 5–15)
BUN: 26 mg/dL — ABNORMAL HIGH (ref 8–23)
CO2: 26 mmol/L (ref 22–32)
Calcium: 7.8 mg/dL — ABNORMAL LOW (ref 8.9–10.3)
Chloride: 104 mmol/L (ref 98–111)
Creatinine, Ser: 0.75 mg/dL (ref 0.44–1.00)
GFR calc Af Amer: >60 mL/min (ref >60)
GFR calc non Af Amer: >60 mL/min (ref >60)
Glucose, Bld: 110 mg/dL — ABNORMAL HIGH (ref 70–99)
Potassium: 4.1 mmol/L (ref 3.5–5.1)
Sodium: 138 mmol/L (ref 135–145)

## 2020-01-20 LAB — CBC WITH DIFFERENTIAL/PLATELET
Abs Immature Granulocytes: 0.04 10*3/uL (ref 0.00–0.07)
Basophils Absolute: 0 10*3/uL (ref 0.0–0.1)
Basophils Relative: 0 %
Eosinophils Absolute: 0.1 10*3/uL (ref 0.0–0.5)
Eosinophils Relative: 1 %
HCT: 32 % — ABNORMAL LOW (ref 36.0–46.0)
Hemoglobin: 9.9 g/dL — ABNORMAL LOW (ref 12.0–15.0)
Immature Granulocytes: 1 %
Lymphocytes Relative: 11 %
Lymphs Abs: 0.9 10*3/uL (ref 0.7–4.0)
MCH: 27.7 pg (ref 26.0–34.0)
MCHC: 30.9 g/dL (ref 30.0–36.0)
MCV: 89.4 fL (ref 80.0–100.0)
Monocytes Absolute: 0.8 10*3/uL (ref 0.1–1.0)
Monocytes Relative: 10 %
Neutro Abs: 6.7 10*3/uL (ref 1.7–7.7)
Neutrophils Relative %: 77 %
Platelets: 290 10*3/uL (ref 150–400)
RBC: 3.58 MIL/uL — ABNORMAL LOW (ref 3.87–5.11)
RDW: 13.9 % (ref 11.5–15.5)
WBC: 8.5 10*3/uL (ref 4.0–10.5)
nRBC: 0 % (ref 0.0–0.2)

## 2020-01-20 LAB — MAGNESIUM: Magnesium: 2 mg/dL (ref 1.7–2.4)

## 2020-01-20 LAB — URINE CULTURE: Culture: 40000 — AB

## 2020-01-20 MED ORDER — IOHEXOL 300 MG/ML  SOLN
100.0000 mL | Freq: Once | INTRAMUSCULAR | Status: AC | PRN
  Administered 2020-01-20: 100 mL via INTRAVENOUS

## 2020-01-20 MED ORDER — IOHEXOL 9 MG/ML PO SOLN
500.0000 mL | ORAL | Status: AC
  Administered 2020-01-20: 500 mL via ORAL

## 2020-01-20 MED ORDER — IOHEXOL 9 MG/ML PO SOLN
ORAL | Status: AC
  Filled 2020-01-20: qty 1000

## 2020-01-20 NOTE — Evaluation (Signed)
Physical Therapy Evaluation Patient Details Name: IZEBELLA AAKRE MRN: [id number] DOB: [birth year] Today's Date: 01/20/2020   History of Present Illness  86 y.o. female who has a history of paroxysmal atrial fibrillation not on chronic anticoagulation, hyperlipidemia, hypertension admitted with 2-3 week history diarrhea.  Pt also found to have afib.  Clinical Impression  Pt admitted with above diagnosis.  Pt currently with functional limitations due to the deficits listed below (see PT Problem List). Pt will benefit from skilled PT to increase their independence and safety with mobility to allow discharge to the venue listed below.  RN reports meds for heart rate provided prior to session and agreeable to mobilize pt.  Pt reports weakness and fatigue limiting mobility at this time.  Pt typically able to ambulate with 4WW around her apartment and uses power chair for mobility within facility (ie dining hall).  Pt agreeable to increased care or SNF upon d/c stating she is typically alone and modified independent.  HR monitored during session: 116 bpm prior to activity and 97-118 bpm during transfer.  Recommend SNF at this time.     Follow Up Recommendations SNF;Supervision for mobility/OOB    Equipment Recommendations  None recommended by PT    Recommendations for Other Services       Precautions / Restrictions Precautions Precautions: Fall Precaution Comments: monitor HR Restrictions Weight Bearing Restrictions: No      Mobility  Bed Mobility Overal bed mobility: Needs Assistance Bed Mobility: Supine to Sit     Supine to sit: Min guard;HOB elevated     General bed mobility comments: increased time and effort, pt utilized bed rail to self assist  Transfers Overall transfer level: Needs assistance Equipment used: Rolling walker (2 wheeled) Transfers: Sit to/from [facility] Sit to Stand: Min assist Stand pivot transfers: Min assist       General transfer  comment: assist to rise and steady,  Ambulation/Gait             General Gait Details: pt declined today, felt too weak and fatigued (typically only ambulates apt distances at baseline)  Financial trader Rankin (Stroke Patients Only)       Balance Overall balance assessment: Needs assistance         Standing balance support: Bilateral upper extremity supported Standing balance-Leahy Scale: Poor Standing balance comment: uses UE support (baseline), denies any fall history                             Pertinent Vitals/Pain Pain Assessment: No/denies pain    Home Living Family/patient expects to be discharged to:: Private residence Living Arrangements: Alone   Type of Home: Independent living facility         Home Equipment: Gilford Rile - 4 wheels;Electric scooter      Prior Function Level of Independence: Independent with assistive device(s)         Comments: uses walker in apartment, uses power chair when out of apartment to get to dining room, ect     Hand Dominance   Dominant Hand: Right    Extremity/Trunk Assessment   Upper Extremity Assessment Upper Extremity Assessment: Overall WFL for tasks assessed    Lower Extremity Assessment Lower Extremity Assessment: Generalized weakness       Communication   Communication: HOH  Cognition Arousal/Alertness: Awake/alert Behavior During Therapy: WFL for tasks assessed/performed  Overall Cognitive Status: Within Functional Limits for tasks assessed                                        General Comments      Exercises     Assessment/Plan    PT Assessment Patient needs continued PT services  PT Problem List Decreased balance;Decreased activity tolerance;Decreased strength;Decreased mobility;Decreased knowledge of use of DME;Cardiopulmonary status limiting activity       PT Treatment Interventions DME instruction;Therapeutic  activities;Gait training;Therapeutic exercise;Patient/family education;Functional mobility training;Balance training    PT Goals (Current goals can be found in the Care Plan section)  Acute Rehab PT Goals Patient Stated Goal: to feel better PT Goal Formulation: With patient Time For Goal Achievement: 02/03/20 Potential to Achieve Goals: Good    Frequency Min 2X/week   Barriers to discharge        Co-evaluation               AM-PAC PT "6 Clicks" Mobility  Outcome Measure Help needed turning from your back to your side while in a flat bed without using bedrails?: A Little Help needed moving from lying on your back to sitting on the side of a flat bed without using bedrails?: A Little Help needed moving to and from a bed to a chair (including a wheelchair)?: A Little Help needed standing up from a chair using your arms (e.g., wheelchair or bedside chair)?: A Little Help needed to walk in hospital room?: A Little Help needed climbing 3-5 steps with a railing? : A Lot 6 Click Score: 17    End of Session Equipment Utilized During Treatment: Gait belt Activity Tolerance: Patient limited by fatigue Patient left: in chair;with call bell/phone within reach;with chair alarm set Nurse Communication: Mobility status PT Visit Diagnosis: Difficulty in walking, not elsewhere classified (R26.2);Muscle weakness (generalized) (M62.81)    Time: [id number] PT Time Calculation (min) (ACUTE ONLY): 16 min   Charges:   PT Evaluation $PT Eval Low Complexity: 1 Low     Kati PT, DPT Acute Rehabilitation Services Office: [phone]  Trena Platt 01/20/2020, 3:21 PM

## 2020-01-20 NOTE — Progress Notes (Signed)
PROGRESS NOTE  Terri Acosta [id number] DOB: Nov 05, 1922 DOA: 01/18/2020 PCP: [name], [name], NP  HPI/Recap of past 24 hours: HPI from Dr Maralyn Sago is a 86 y.o. female who has a history of paroxysmal atrial fibrillation not on chronic anticoagulation for unknown reasons (question if due to age), hypertension. [facility] [place] [facility] living section. She states that for the past 2 to 3 weeks she has been having diarrhea.  She describes the diarrhea as brown in color and like water, she has about 5-6 episodes a day. Over the past week or so she has become progressively weak.  She denies fever, abdominal pain, nausea, vomiting.  She had some labs drawn at her facility and had a potassium of 2.4 that is being replaced.  Because of continued diarrhea and weakness, she decided to come to the ED. Pt was found to be normotensive but tachycardic with a heart rate as high as 130s in atrial fibrillation, she is afebrile, labs are significant for potassium of 2.6, she has a normal WBC count.  Stool studies negative for C. Difficile. Pt admitted for further evaluation and management.    Today, patient continues to have multiple episodes of watery/loose stools, continues to deny any abdominal pain, nausea/vomiting, fever/chills, chest pain, shortness of breath.    Assessment/Plan: Principal Problem:   Diarrhea Active Problems:   Hyperlipidemia   Essential hypertension   Atrial fibrillation with rapid ventricular response (HCC)   Hypokalemia   Acute gastroenteritis   Acute gastroenteritis Likely ??viral  Vs non infectious Afebrile, with no leukocytosis LFTs WNL C.diff negative, stool GI panel pending (consider Imodium if negative) Abd xray unremarkable CT abdomen/pelvis pending Continue IVF, supportive care  Hypokalemia/hypomagnesemia Replace prn  Afib with RVR HR uncontrolled Likely 2/2 above TSH WNL, free T4 1.35, plan to repeat as outpt CXR  hyperinflated lung, otherwise unremarkable  ECHO pending results Continue home metoprolol with IV prn, IVF Not on any AC, continue home ASA Telemetry  HTN Hold home hyzaar   Normocytic anemia Possible dilutional FOBT pending Anemia panel pending Type and screen Daily CBC       Malnutrition Type:      Malnutrition Characteristics:      Nutrition Interventions:       Estimated body mass index is 18.67 kg/m as calculated from the following:   Height as of this encounter: 5\' 2"  (1.575 m).   Weight as of this encounter: 46.3 kg.     Code Status: Full  Family Communication: Discussed with daughter on 01/19/20  Disposition Plan: Status is: Inpatient  The patient will require care spanning > 2 midnights and should be moved to inpatient because: Inpatient level of care appropriate due to severity of illness  Dispo: The patient is from: Independent living facility               Anticipated d/c is to: SNF               Anticipated d/c date is: 1 day              Patient currently is not medically stable to d/c.  Still with multiple episodes of diarrhea, further work-up pending   Consultants:  None  Procedures:  None  Antimicrobials:  None  DVT prophylaxis: Lovenox   Objective: Vitals:   01/19/20 2246 01/20/20 0217 01/20/20 0506 01/20/20 1301  BP: (!) 142/100 (!) 140/98 138/90 (!) 134/93  Pulse: (!) 115 (!) 110 [name])  104 (!) 129  Resp:    18  Temp: 97.9 F (36.6 C) 98.6 F (37 C) 99 F (37.2 C) 99.3 F (37.4 C)  TempSrc: Oral Oral Oral Oral  SpO2: 97% 98% 97% 97%  Weight:      Height:        Intake/Output Summary (Last 24 hours) at 01/20/2020 1617 Last data filed at 01/20/2020 0556 Gross per 24 hour  Intake 1161.71 ml  Output 175 ml  Net 986.71 ml   Filed Weights   01/18/20 1114 01/18/20 1710  Weight: 45 kg 46.3 kg    Exam:  General: NAD, elderly, frail, oriented    Cardiovascular: S1, S2 present  Respiratory: CTAB  Abdomen:  Soft, nontender, nondistended, bowel sounds present  Musculoskeletal: No bilateral pedal edema noted  Skin: Normal  Psychiatry: Normal mood    Data Reviewed: CBC: Recent Labs  Lab 01/15/20 0840 01/18/20 1302 01/19/20 0549 01/20/20 0311  WBC 9.9 10.2 9.8 8.5  NEUTROABS 7,544 8.6*  --  6.7  HGB 11.8 11.5* 11.4* 9.9*  HCT 35.4 35.8* 36.1 32.0*  MCV 84.5 87.1 88.0 89.4  PLT 325 327 339 [id number]   Basic Metabolic Panel: Recent Labs  Lab 01/15/20 0840 01/18/20 1302 01/18/20 1730 01/19/20 0549 01/20/20 0311  NA 135 133*  --  137 138  K 2.5* 2.6*  --  4.0 4.1  CL 93* 90*  --  99 104  CO2 31 31  --  27 26  GLUCOSE 112* 121*  --  125* 110*  BUN 27* 37*  --  30* 26*  CREATININE 0.78 0.87  --  0.89 0.75  CALCIUM 8.5* 8.1*  --  8.1* 7.8*  MG  --   --  1.6*  --  2.0   GFR: Estimated Creatinine Clearance: 31.4 mL/min (by C-G formula based on SCr of 0.75 mg/dL). Liver Function Tests: Recent Labs  Lab 01/15/20 0840 01/18/20 1302  AST 12 14*  ALT 7 10  ALKPHOS  --  47  BILITOT 0.7 1.0  PROT 5.9* 5.8*  ALBUMIN  --  3.0*   No results for input(s): LIPASE, AMYLASE in the last 168 hours. No results for input(s): AMMONIA in the last 168 hours. Coagulation Profile: Recent Labs  Lab 01/18/20 1302  INR 1.1   Cardiac Enzymes: No results for input(s): CKTOTAL, CKMB, CKMBINDEX, TROPONINI in the last 168 hours. BNP (last 3 results) No results for input(s): PROBNP in the last 8760 hours. HbA1C: No results for input(s): HGBA1C in the last 72 hours. CBG: No results for input(s): GLUCAP in the last 168 hours. Lipid Profile: No results for input(s): CHOL, HDL, LDLCALC, TRIG, CHOLHDL, LDLDIRECT in the last 72 hours. Thyroid Function Tests: Recent Labs    01/19/20 0820 01/19/20 0859  TSH 2.195  --   FREET4  --  1.35*   Anemia Panel: No results for input(s): VITAMINB12, FOLATE, FERRITIN, TIBC, IRON, RETICCTPCT in the last 72 hours. Urine analysis:    Component Value  Date/Time   COLORURINE AMBER (A) 01/19/2020 1130   APPEARANCEUR CLOUDY (A) 01/19/2020 1130   LABSPEC 1.018 01/19/2020 1130   PHURINE 5.0 01/19/2020 1130   GLUCOSEU NEGATIVE 01/19/2020 1130   HGBUR SMALL (A) 01/19/2020 1130   BILIRUBINUR NEGATIVE 01/19/2020 1130   BILIRUBINUR negative 07/02/2018 1700   KETONESUR NEGATIVE 01/19/2020 1130   PROTEINUR 30 (A) 01/19/2020 1130   UROBILINOGEN 0.2 07/02/2018 1700   NITRITE NEGATIVE 01/19/2020 1130   LEUKOCYTESUR LARGE (A) 01/19/2020 1130  Sepsis Labs: @LABRCNTIP (procalcitonin:4,lacticidven:4)  ) Recent Results (from the past 240 hour(s))  SARS Coronavirus 2 by RT PCR (hospital order, performed in [facility] hospital lab) Nasopharyngeal Nasopharyngeal Swab     Status: None   Collection Time: 01/18/20  2:56 PM   Specimen: Nasopharyngeal Swab  Result Value Ref Range Status   SARS Coronavirus 2 NEGATIVE NEGATIVE Final    Comment: (NOTE) SARS-CoV-2 target nucleic acids are NOT DETECTED. The SARS-CoV-2 RNA is generally detectable in upper and lower respiratory specimens during the acute phase of infection. The lowest concentration of SARS-CoV-2 viral copies this assay can detect is 250 copies / mL. A negative result does not preclude SARS-CoV-2 infection and should not be used as the sole basis for treatment or other patient management decisions.  A negative result may occur with improper specimen collection / handling, submission of specimen other than nasopharyngeal swab, presence of viral mutation(s) within the areas targeted by this assay, and inadequate number of viral copies (<250 copies / mL). A negative result must be combined with clinical observations, patient history, and epidemiological information. Fact Sheet for Patients:   StrictlyIdeas.no Fact Sheet for Healthcare Providers: BankingDealers.co.za This test is not yet approved or cleared  by the [place] FDA and has been  authorized for detection and/or diagnosis of SARS-CoV-2 by FDA under an Emergency Use Authorization (EUA).  This EUA will remain in effect (meaning this test can be used) for the duration of the COVID-19 declaration under Section 564(b)(1) of the Act, 21 U.S.C. section 360bbb-3(b)(1), unless the authorization is terminated or revoked sooner. Performed at [facility], [place] [address] [place], [place] [address]   C Difficile Quick Screen w PCR reflex     Status: None   Collection Time: 01/19/20  3:00 AM   Specimen: STOOL  Result Value Ref Range Status   C Diff antigen NEGATIVE NEGATIVE Final   C Diff toxin NEGATIVE NEGATIVE Final   C Diff interpretation No C. difficile detected.  Final    Comment: Performed at [facility], [place] [address], [place], [place] [address]  Urine Culture     Status: Abnormal   Collection Time: 01/19/20 11:30 AM   Specimen: Urine, Random  Result Value Ref Range Status   Specimen Description   Final    URINE, RANDOM Performed at [place] [address] [place], [place] [address]    Special Requests   Final    NONE Performed at [facility], [place] [address], [place], [place] [address]    Culture (A)  Final    40,000 COLONIES/mL MULTIPLE SPECIES PRESENT, SUGGEST RECOLLECTION   Report Status 01/20/2020 FINAL  Final      Studies: No results found.  Scheduled Meds: . aspirin EC  81 mg Oral Daily  . enoxaparin (LOVENOX) injection  30 mg Subcutaneous Q24H  . iohexol  500 mL Oral Q1H  . metoprolol tartrate  50 mg Oral BID    Continuous Infusions: . sodium chloride 75 mL/hr at 01/20/20 1246     LOS: 2 days     Alma Friendly, MD [facility]  If 7PM-7AM, please contact night-coverage [email or web address] 01/20/2020, 4:17 PM

## 2020-01-20 NOTE — Evaluation (Signed)
Occupational Therapy Evaluation Patient Details Name: Terri Acosta MRN: [id number] DOB: 10/18/1922 Today's Date: 01/20/2020    History of Present Illness 86 y.o. female who has a history of paroxysmal atrial fibrillation not on chronic anticoagulation, hyperlipidemia, hypertension admitted with 2-3 week history diarrhea.    Clinical Impression   Patient with functional deficits listed below impacting safety and independence with self care. Limited to bed level eval due to patient's HR elevated between 130-150s at rest. Patient reports she has been getting to bedside commode due to ongoing diarrhea. Had patient perform bed level grooming/hygiene with set up assist. Anticipate patient will be x1 assist for functional transfers/OOB ADL. Will continue to follow and plan to assess next session as patient stabilizes medically.     Follow Up Recommendations  Home health OT;No OT follow up(HH vs home pending progress)    Equipment Recommendations  None recommended by OT       Precautions / Restrictions Precautions Precautions: Fall Precaution Comments: monitor HR Restrictions Weight Bearing Restrictions: No      Mobility Bed Mobility               General bed mobility comments: not tested due to HR  Transfers                 General transfer comment: not tested due to HR        ADL either performed or assessed with clinical judgement   ADL Overall ADL's : Needs assistance/impaired Eating/Feeding: Set up;Bed level   Grooming: Oral care;Wash/dry face;Set up;Bed level Grooming Details (indicate cue type and reason): patient HR between 130-150 resting in bed therefore deferred g/h to bed level today Upper Body Bathing: Set up;Sitting       Upper Body Dressing : Set up;Bed level                     General ADL Comments: limited evaluation due to HR resting bed level, anticipate will be +1 assist     Vision Baseline Vision/History: Wears glasses Wears  Glasses: At all times Patient Visual Report: Other (comment)(patient is blind in L eye)              Pertinent Vitals/Pain Pain Assessment: No/denies pain     Hand Dominance Right   Extremity/Trunk Assessment Upper Extremity Assessment Upper Extremity Assessment: Overall WFL for tasks assessed   Lower Extremity Assessment Lower Extremity Assessment: Defer to PT evaluation       Communication Communication Communication: HOH   Cognition Arousal/Alertness: Awake/alert Behavior During Therapy: WFL for tasks assessed/performed Overall Cognitive Status: Within Functional Limits for tasks assessed                                                Home Living Family/patient expects to be discharged to:: Other (Comment)([facility])                                        Prior Functioning/Environment Level of Independence: Independent with assistive device(s)        Comments: uses walker in apartment, uses power chair when out of apartment to get to dining room, ect        OT Problem List: Decreased activity tolerance;Cardiopulmonary  status limiting activity      OT Treatment/Interventions: Self-care/ADL training;Therapeutic exercise;Energy conservation;DME and/or AE instruction;Therapeutic activities;Patient/family education;Balance training    OT Goals(Current goals can be found in the care plan section) Acute Rehab OT Goals Patient Stated Goal: to feel better OT Goal Formulation: With patient Time For Goal Achievement: 02/03/20 Potential to Achieve Goals: Good  OT Frequency: Min 2X/week    AM-PAC OT "6 Clicks" Daily Activity     Outcome Measure Help from another person eating meals?: A Little Help from another person taking care of personal grooming?: A Little Help from another person toileting, which includes using toliet, bedpan, or urinal?: A Little Help from another person bathing (including  washing, rinsing, drying)?: A Little Help from another person to put on and taking off regular upper body clothing?: A Little Help from another person to put on and taking off regular lower body clothing?: A Little 6 Click Score: 18   End of Session  Activity Tolerance: Treatment limited secondary to medical complications (Comment)(HR between 130-150 at rest) Patient left: in bed;with call bell/phone within reach;with bed alarm set  OT Visit Diagnosis: Other abnormalities of gait and mobility (R26.89)                Time: [id number] OT Time Calculation (min): 20 min Charges:  OT General Charges $OT Visit: 1 Visit OT Evaluation $OT Eval Moderate Complexity: 1 Mod  Delbert Phenix OT Pager: [place] 01/20/2020, 1:11 PM

## 2020-01-21 ENCOUNTER — Encounter: Payer: Medicare Other | Admitting: Internal Medicine

## 2020-01-21 LAB — CBC WITH DIFFERENTIAL/PLATELET
Abs Immature Granulocytes: 0.06 10*3/uL (ref 0.00–0.07)
Basophils Absolute: 0 10*3/uL (ref 0.0–0.1)
Basophils Relative: 0 %
Eosinophils Absolute: 0.1 10*3/uL (ref 0.0–0.5)
Eosinophils Relative: 1 %
HCT: 32.9 % — ABNORMAL LOW (ref 36.0–46.0)
Hemoglobin: 10.3 g/dL — ABNORMAL LOW (ref 12.0–15.0)
Immature Granulocytes: 1 %
Lymphocytes Relative: 13 %
Lymphs Abs: 1.1 10*3/uL (ref 0.7–4.0)
MCH: 27.8 pg (ref 26.0–34.0)
MCHC: 31.3 g/dL (ref 30.0–36.0)
MCV: 88.7 fL (ref 80.0–100.0)
Monocytes Absolute: 0.8 10*3/uL (ref 0.1–1.0)
Monocytes Relative: 9 %
Neutro Abs: 6.5 10*3/uL (ref 1.7–7.7)
Neutrophils Relative %: 76 %
Platelets: 314 10*3/uL (ref 150–400)
RBC: 3.71 MIL/uL — ABNORMAL LOW (ref 3.87–5.11)
RDW: 14 % (ref 11.5–15.5)
WBC: 8.4 10*3/uL (ref 4.0–10.5)
nRBC: 0 % (ref 0.0–0.2)

## 2020-01-21 LAB — GASTROINTESTINAL PANEL BY PCR, STOOL (REPLACES STOOL CULTURE)

## 2020-01-21 LAB — FERRITIN: Ferritin: 96 ng/mL (ref 11–307)

## 2020-01-21 LAB — BASIC METABOLIC PANEL
Anion gap: 11 (ref 5–15)
BUN: 21 mg/dL (ref 8–23)
CO2: 23 mmol/L (ref 22–32)
Calcium: 7.8 mg/dL — ABNORMAL LOW (ref 8.9–10.3)
Chloride: 100 mmol/L (ref 98–111)
Creatinine, Ser: 0.64 mg/dL (ref 0.44–1.00)
GFR calc Af Amer: >60 mL/min (ref >60)
GFR calc non Af Amer: >60 mL/min (ref >60)
Glucose, Bld: 100 mg/dL — ABNORMAL HIGH (ref 70–99)
Potassium: 3.8 mmol/L (ref 3.5–5.1)
Sodium: 134 mmol/L — ABNORMAL LOW (ref 135–145)

## 2020-01-21 LAB — OCCULT BLOOD X 1 CARD TO LAB, STOOL: Fecal Occult Bld: NEGATIVE

## 2020-01-21 LAB — ABO/RH: ABO/RH(D): O POS

## 2020-01-21 LAB — IRON AND TIBC
Iron: 48 ug/dL (ref 28–170)
Saturation Ratios: 24 % (ref 10.4–31.8)
TIBC: 200 ug/dL — ABNORMAL LOW (ref 250–450)
UIBC: 152 ug/dL

## 2020-01-21 LAB — TYPE AND SCREEN
ABO/RH(D): O POS
Antibody Screen: NEGATIVE

## 2020-01-21 LAB — FOLATE: Folate: 11.2 ng/mL (ref >5.9)

## 2020-01-21 LAB — VITAMIN B12: Vitamin B-12: 798 pg/mL (ref 180–914)

## 2020-01-21 LAB — MAGNESIUM: Magnesium: 1.8 mg/dL (ref 1.7–2.4)

## 2020-01-21 MED ORDER — METOPROLOL TARTRATE 5 MG/5ML IV SOLN
2.5000 mg | Freq: Four times a day (QID) | INTRAVENOUS | Status: DC | PRN
  Administered 2020-01-23: 2.5 mg via INTRAVENOUS
  Filled 2020-01-21: qty 5

## 2020-01-21 MED ORDER — METRONIDAZOLE IN NACL 5-0.79 MG/ML-% IV SOLN
500.0000 mg | Freq: Three times a day (TID) | INTRAVENOUS | Status: DC
  Administered 2020-01-21: 500 mg via INTRAVENOUS
  Filled 2020-01-21: qty 100

## 2020-01-21 MED ORDER — METRONIDAZOLE IN NACL 5-0.79 MG/ML-% IV SOLN
500.0000 mg | Freq: Three times a day (TID) | INTRAVENOUS | Status: DC
  Administered 2020-01-21 – 2020-01-26 (×14): 500 mg via INTRAVENOUS
  Filled 2020-01-21 (×14): qty 100

## 2020-01-21 MED ORDER — SODIUM CHLORIDE 0.9 % IV SOLN
2.0000 g | INTRAVENOUS | Status: DC
  Administered 2020-01-22 – 2020-01-26 (×5): 2 g via INTRAVENOUS
  Filled 2020-01-21: qty 20
  Filled 2020-01-21 (×5): qty 2

## 2020-01-21 MED ORDER — LOPERAMIDE HCL 2 MG PO CAPS
2.0000 mg | ORAL_CAPSULE | Freq: Four times a day (QID) | ORAL | Status: DC | PRN
  Administered 2020-01-21 – 2020-01-24 (×3): 2 mg via ORAL
  Filled 2020-01-21 (×4): qty 1

## 2020-01-21 MED ORDER — IPRATROPIUM-ALBUTEROL 0.5-2.5 (3) MG/3ML IN SOLN: 3.0000 mL | RESPIRATORY_TRACT | Status: DC | PRN

## 2020-01-21 MED ORDER — SODIUM CHLORIDE 0.9 % IV SOLN
2.0000 g | INTRAVENOUS | Status: DC
  Administered 2020-01-21: 2 g via INTRAVENOUS
  Filled 2020-01-21: qty 2

## 2020-01-21 NOTE — Care Management Important Message (Signed)
Important Message  Patient Details  Name: Terri Acosta MRN: [id number] Date of Birth: 07-12-1923   Medicare Important Message Given:     Given to Evette Cristal for the patient to sign    Crista Luria 01/21/2020, 10:34 AM

## 2020-01-21 NOTE — Progress Notes (Signed)
Hemoglobin stable.Patient ID: Terri Acosta, female   DOB: Jan 09, 1923, 86 y.o.   MRN: [id number]  PROGRESS NOTE    Terri Acosta  [id number] DOB: 06-23-1923 DOA: 01/18/2020 PCP: [name], [name], NP   Brief Narrative:  86 y.o.femalewho has a history of paroxysmal atrial fibrillation not on chronic anticoagulation for unknown reasons (question if due to age), hypertension presented with worsening diarrhea for the last 2 to 3 weeks.  In the ED, she was found to be tachycardic, hypokalemic.  Stool studies was negative for C. difficile.  She was admitted for further evaluation and management.  Assessment & Plan:   Probable colitis with possibility of sigmoid diverticulitis causing diarrhea -Presented with worsening diarrhea for the last 2 to 3 weeks.  Stool studies was negative for C. Difficile. -CT of the abdomen done on 01/20/2020 showed possible colitis versus less likely sigmoid diverticulitis along with early cirrhosis.  Cirrhosis can be followed up as an outpatient -will start Rocephin and Flagyl for colitis.  Use Imodium as needed -Continue IV fluid.  Paroxysmal A. fib with RVR -Still intermittently tachycardic.  Continue metoprolol.  Not on anticoagulation as an outpatient probably due to advanced age and increased risks.  Hypertension -Monitor blood pressure.  Continue metoprolol.  Hyzaar and held for now.  Normocytic anemia -Hemoglobin stable.  Monitor intermittently.  Generalized conditioning -ED/OT recommend SNF placement.  Will consult social worker.  DVT prophylaxis: Lovenox Code Status: DNR Family Communication: Spoke to patient at bedside Disposition Plan: Status is: Inpatient  Remains inpatient appropriate because:IV treatments appropriate due to intensity of illness or inability to take PO.  Still having a lot of diarrhea and feels very weak.  Will need SNF placement   Dispo: The patient is from: Independent living facility              Anticipated d/c is to: Home             Anticipated d/c date is: 2 days              Patient currently is not medically stable to d/c.   Consultants: None  Procedures: Echo report pending  Antimicrobials: Start Rocephin and Flagyl today   Subjective: Patient seen and examined at bedside.  She complains of a lot of diarrhea and does not feel that it has gotten better yet.  No overnight fever, nausea or vomiting reported.  Objective: Vitals:   01/20/20 2300 01/21/20 0212 01/21/20 0601 01/21/20 0920  BP:  126/79 (!) 140/93 120/70  Pulse:  (!) 109 98 (!) 131  Resp: 18 20 20    Temp:  (!) 97.4 F (36.3 C) 97.8 F (36.6 C) 97.9 F (36.6 C)  TempSrc:  Oral Oral Oral  SpO2:  97% 97% 99%  Weight:      Height:       No intake or output data in the 24 hours ending 01/21/20 1105 Filed Weights   01/18/20 1114 01/18/20 1710  Weight: 45 kg 46.3 kg    Examination:  General exam: Appears calm and comfortable.  Elderly female lying in bed. Respiratory system: Bilateral decreased breath sounds at bases Cardiovascular system: S1 & S2 heard, intermittently tachycardic Gastrointestinal system: Abdomen is nondistended, soft and nontender. Normal bowel sounds heard. Extremities: No cyanosis, clubbing, edema  Central nervous system: Alert and oriented. No focal neurological deficits. Moving extremities Skin: No rashes, lesions or ulcers Psychiatry: Judgement and insight appear normal. Mood & affect appropriate.     Data Reviewed: I have  personally reviewed following labs and imaging studies  CBC: Recent Labs  Lab 01/15/20 0840 01/18/20 1302 01/19/20 0549 01/20/20 0311 01/21/20 0359  WBC 9.9 10.2 9.8 8.5 8.4  NEUTROABS 7,544 8.6*  --  6.7 6.5  HGB 11.8 11.5* 11.4* 9.9* 10.3*  HCT 35.4 35.8* 36.1 32.0* 32.9*  MCV 84.5 87.1 88.0 89.4 88.7  PLT 325 327 339 290 [id number]   Basic Metabolic Panel: Recent Labs  Lab 01/15/20 0840 01/18/20 1302 01/18/20 1730 01/19/20 0549 01/20/20 0311 01/21/20 0359  NA 135 133*   --  137 138 134*  K 2.5* 2.6*  --  4.0 4.1 3.8  CL 93* 90*  --  99 104 100  CO2 31 31  --  27 26 23   GLUCOSE 112* 121*  --  125* 110* 100*  BUN 27* 37*  --  30* 26* 21  CREATININE 0.78 0.87  --  0.89 0.75 0.64  CALCIUM 8.5* 8.1*  --  8.1* 7.8* 7.8*  MG  --   --  1.6*  --  2.0 1.8   GFR: Estimated Creatinine Clearance: 31.4 mL/min (by C-G formula based on SCr of 0.64 mg/dL). Liver Function Tests: Recent Labs  Lab 01/15/20 0840 01/18/20 1302  AST 12 14*  ALT 7 10  ALKPHOS  --  47  BILITOT 0.7 1.0  PROT 5.9* 5.8*  ALBUMIN  --  3.0*   No results for input(s): LIPASE, AMYLASE in the last 168 hours. No results for input(s): AMMONIA in the last 168 hours. Coagulation Profile: Recent Labs  Lab 01/18/20 1302  INR 1.1   Cardiac Enzymes: No results for input(s): CKTOTAL, CKMB, CKMBINDEX, TROPONINI in the last 168 hours. BNP (last 3 results) No results for input(s): PROBNP in the last 8760 hours. HbA1C: No results for input(s): HGBA1C in the last 72 hours. CBG: No results for input(s): GLUCAP in the last 168 hours. Lipid Profile: No results for input(s): CHOL, HDL, LDLCALC, TRIG, CHOLHDL, LDLDIRECT in the last 72 hours. Thyroid Function Tests: Recent Labs    01/19/20 0820 01/19/20 0859  TSH 2.195  --   FREET4  --  1.35*   Anemia Panel: Recent Labs    01/21/20 0359  VITAMINB12 798  FOLATE 11.2  FERRITIN 96  TIBC 200*  IRON 48   Sepsis Labs: No results for input(s): PROCALCITON, LATICACIDVEN in the last 168 hours.  Recent Results (from the past 240 hour(s))  SARS Coronavirus 2 by RT PCR (hospital order, performed in [facility] hospital lab) Nasopharyngeal Nasopharyngeal Swab     Status: None   Collection Time: 01/18/20  2:56 PM   Specimen: Nasopharyngeal Swab  Result Value Ref Range Status   SARS Coronavirus 2 NEGATIVE NEGATIVE Final    Comment: (NOTE) SARS-CoV-2 target nucleic acids are NOT DETECTED. The SARS-CoV-2 RNA is generally detectable in upper and  lower respiratory specimens during the acute phase of infection. The lowest concentration of SARS-CoV-2 viral copies this assay can detect is 250 copies / mL. A negative result does not preclude SARS-CoV-2 infection and should not be used as the sole basis for treatment or other patient management decisions.  A negative result may occur with improper specimen collection / handling, submission of specimen other than nasopharyngeal swab, presence of viral mutation(s) within the areas targeted by this assay, and inadequate number of viral copies (<250 copies / mL). A negative result must be combined with clinical observations, patient history, and epidemiological information. Fact Sheet for Patients:   StrictlyIdeas.no Fact  Sheet for Healthcare Providers: BankingDealers.co.za This test is not yet approved or cleared  by the [place] and has been authorized for detection and/or diagnosis of SARS-CoV-2 by FDA under an Emergency Use Authorization (EUA).  This EUA will remain in effect (meaning this test can be used) for the duration of the COVID-19 declaration under Section 564(b)(1) of the Act, 21 U.S.C. section 360bbb-3(b)(1), unless the authorization is terminated or revoked sooner. Performed at [facility], [place] [address], [place], [place] [address]   C Difficile Quick Screen w PCR reflex     Status: None   Collection Time: 01/19/20  3:00 AM   Specimen: STOOL  Result Value Ref Range Status   C Diff antigen NEGATIVE NEGATIVE Final   C Diff toxin NEGATIVE NEGATIVE Final   C Diff interpretation No C. difficile detected.  Final    Comment: Performed at [facility], [place] [address] [place], [place] [address]  Urine Culture     Status: Abnormal   Collection Time: 01/19/20 11:30 AM   Specimen: Urine, Random  Result Value Ref Range Status   Specimen Description   Final    URINE,  RANDOM Performed at [place] [address], [place], [place] [address]    Special Requests   Final    NONE Performed at [facility], [place] [address] [place], [place] [address]    Culture (A)  Final    40,000 COLONIES/mL MULTIPLE SPECIES PRESENT, SUGGEST RECOLLECTION   Report Status 01/20/2020 FINAL  Final         Radiology Studies: CT ABDOMEN PELVIS W CONTRAST  Result Date: 01/20/2020 CLINICAL DATA:  86 year old female with diarrhea. EXAM: CT ABDOMEN AND PELVIS WITH CONTRAST TECHNIQUE: Multidetector CT imaging of the abdomen and pelvis was performed using the standard protocol following bolus administration of intravenous contrast. CONTRAST:  180mL OMNIPAQUE IOHEXOL 300 MG/ML  SOLN COMPARISON:  Abdominal radiograph dated 01/19/2020 FINDINGS: Lower chest: Small bilateral pleural effusions with associated partial compressive atelectasis of the lower lobes. Pneumonia is not excluded. Clinical correlation is recommended. There is mild cardiomegaly. Multi vessel coronary vascular calcification. No intra-abdominal free air. Diffuse mesenteric edema and small perihepatic ascites. Hepatobiliary: Minimal irregularity of the liver contour may represent early changes of cirrhosis. Clinical correlation is recommended. No intrahepatic biliary ductal dilatation. The gallbladder is unremarkable. Pancreas: Unremarkable. No pancreatic ductal dilatation or surrounding inflammatory changes. Spleen: Normal in size without focal abnormality. Adrenals/Urinary Tract: The adrenal glands are unremarkable. There is no hydronephrosis on either side. There is symmetric enhancement and excretion of contrast by both kidneys. There is a 2 cm left renal interpolar cyst and several subcentimeter hypodensities which are too small to characterize. The visualized ureters and urinary bladder appear unremarkable. Stomach/Bowel: There is scattered sigmoid diverticula. Diffuse thickening of  the distal colon primarily involving the rectosigmoid most consistent with colitis. Clinical correlation is recommended. There is no bowel obstruction. The appendix is normal. Vascular/Lymphatic: Advanced aortoiliac atherosclerotic disease. There is a 3.5 cm partially thrombosed infrarenal abdominal aortic aneurysm. The IVC is unremarkable. No portal venous gas. There is no adenopathy. Reproductive: The uterus is grossly unremarkable. Other: Diffuse subcutaneous edema and anasarca. Musculoskeletal: Osteopenia with degenerative changes of the spine. No acute osseous pathology. 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 IMPRESSION: 1. Findings most likely represent colitis and less likely sigmoid diverticulitis. Clinical correlation is recommended. No bowel obstruction. Normal appendix. 2. Small bilateral pleural effusions, small ascites and anasarca. 3.  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 4. A 3.5 cm partially thrombosed infrarenal abdominal aortic aneurysm. 5. Aortic Atherosclerosis (ICD10-I70.0). Electronically Signed   By: Anner Crete M.D.   On: 01/20/2020 20:29   DG Abd Portable 1V  Result Date: 01/19/2020 CLINICAL DATA:  Diarrhea. EXAM: PORTABLE ABDOMEN - 1 VIEW COMPARISON:  None. FINDINGS: The bowel gas pattern is normal. No radio-opaque calculi or other significant radiographic abnormality are seen. Extensive vascular calcification noted. Advanced lumbar spine degenerative changes and chronic avascular necrosis of left hip noted. IMPRESSION: Unremarkable bowel gas pattern. No acute findings. Electronically Signed   By: Marlaine Hind M.D.   On: 01/19/2020 12:12        Scheduled Meds: . aspirin EC  81 mg Oral Daily  . enoxaparin (LOVENOX) injection  30 mg Subcutaneous Q24H  . metoprolol tartrate  50 mg Oral BID   Continuous Infusions: . sodium chloride 75 mL/hr at 01/20/20 1937  . cefTRIAXone (ROCEPHIN)  IV    . metronidazole            Aline August, MD [facility] 01/21/2020, 11:05  AM

## 2020-01-21 NOTE — Progress Notes (Signed)
A user error has taken place.

## 2020-01-22 DIAGNOSIS — E876 Hypokalemia: Secondary | ICD-10-CM

## 2020-01-22 LAB — MAGNESIUM: Magnesium: 1.8 mg/dL (ref 1.7–2.4)

## 2020-01-22 LAB — CBC WITH DIFFERENTIAL/PLATELET
Abs Immature Granulocytes: 0.08 10*3/uL — ABNORMAL HIGH (ref 0.00–0.07)
Basophils Absolute: 0 10*3/uL (ref 0.0–0.1)
Basophils Relative: 0 %
Eosinophils Absolute: 0.1 10*3/uL (ref 0.0–0.5)
Eosinophils Relative: 1 %
HCT: 33.1 % — ABNORMAL LOW (ref 36.0–46.0)
Hemoglobin: 10.3 g/dL — ABNORMAL LOW (ref 12.0–15.0)
Immature Granulocytes: 1 %
Lymphocytes Relative: 13 %
Lymphs Abs: 1.2 10*3/uL (ref 0.7–4.0)
MCH: 27.8 pg (ref 26.0–34.0)
MCHC: 31.1 g/dL (ref 30.0–36.0)
MCV: 89.2 fL (ref 80.0–100.0)
Monocytes Absolute: 0.7 10*3/uL (ref 0.1–1.0)
Monocytes Relative: 8 %
Neutro Abs: 7.3 10*3/uL (ref 1.7–7.7)
Neutrophils Relative %: 77 %
Platelets: 307 10*3/uL (ref 150–400)
RBC: 3.71 MIL/uL — ABNORMAL LOW (ref 3.87–5.11)
RDW: 14.1 % (ref 11.5–15.5)
WBC: 9.4 10*3/uL (ref 4.0–10.5)
nRBC: 0 % (ref 0.0–0.2)

## 2020-01-22 LAB — BASIC METABOLIC PANEL
Anion gap: 10 (ref 5–15)
BUN: 17 mg/dL (ref 8–23)
CO2: 21 mmol/L — ABNORMAL LOW (ref 22–32)
Calcium: 7.9 mg/dL — ABNORMAL LOW (ref 8.9–10.3)
Chloride: 103 mmol/L (ref 98–111)
Creatinine, Ser: 0.65 mg/dL (ref 0.44–1.00)
GFR calc Af Amer: >60 mL/min (ref >60)
GFR calc non Af Amer: >60 mL/min (ref >60)
Glucose, Bld: 100 mg/dL — ABNORMAL HIGH (ref 70–99)
Potassium: 3.8 mmol/L (ref 3.5–5.1)
Sodium: 134 mmol/L — ABNORMAL LOW (ref 135–145)

## 2020-01-22 LAB — ECHOCARDIOGRAM COMPLETE
Height: 62 in
Weight: 1633.6 oz

## 2020-01-22 MED ORDER — METOPROLOL TARTRATE 50 MG PO TABS
75.0000 mg | ORAL_TABLET | Freq: Two times a day (BID) | ORAL | Status: DC
  Administered 2020-01-22 – 2020-01-23 (×2): 75 mg via ORAL
  Filled 2020-01-22 (×2): qty 1

## 2020-01-22 MED ORDER — METOPROLOL TARTRATE 25 MG PO TABS
25.0000 mg | ORAL_TABLET | ORAL | Status: AC
  Administered 2020-01-22: 25 mg via ORAL
  Filled 2020-01-22: qty 1

## 2020-01-22 NOTE — NC FL2 (Signed)
[place] LEVEL OF CARE SCREENING TOOL     IDENTIFICATION  Patient Name: Terri Acosta Birthdate: [birth year] Sex: female Admission Date (Current Location): 01/18/2020  [facility] and [place] Number:  Herbalist and Address:  [facility],  [place], [place]      Provider Number: [id number]  Attending Physician Name and Address:  Aline August, MD  Relative Name and Phone Number:  Maryagnes Amos Daughter [phone]  [phone]    Current Level of Care: Hospital Recommended Level of Care: [place] Prior Approval Number:    Date Approved/Denied:   PASRR Number: [id number]  Discharge Plan: SNF    Current Diagnoses: Patient Active Problem List   Diagnosis Date Noted  . Acute gastroenteritis 01/19/2020  . Diarrhea 01/18/2020  . Atrial fibrillation with rapid ventricular response ([place]) 01/18/2020  . Hypokalemia 01/18/2020  . Dehydration   . Vaginal atrophy 01/15/2019  . Cyst of right kidney 07/27/2017  . Knee pain, chronic 12/26/2016  . Unstable gait 07/04/2016  . COPD (chronic obstructive pulmonary disease) ([place]) 08/04/2014  . Palpitations 02/13/2012  . Hyperglycemia 03/14/2011  . Hyperlipidemia 10/18/2010  . Disorder of bone and cartilage, unspecified 02/18/2007  . Essential hypertension 08/20/2006  . Osteoarthritis 08/20/2006    Orientation RESPIRATION BLADDER Height & Weight     Time, Place, Situation, Self  Normal Continent Weight: 102 lb 1.6 oz (46.3 kg) Height:  5\' 2"  (157.5 cm)  BEHAVIORAL SYMPTOMS/MOOD NEUROLOGICAL BOWEL NUTRITION STATUS      Continent Diet  AMBULATORY STATUS COMMUNICATION OF NEEDS Skin   Limited Assist Verbally Normal                       Personal Care Assistance Level of Assistance  Bathing, Feeding, Dressing Bathing Assistance: Limited assistance Feeding assistance: Independent Dressing Assistance: Limited assistance     Functional Limitations Info   Sight, Hearing, Speech Sight Info: Adequate Hearing Info: Adequate Speech Info: Adequate    SPECIAL CARE FACTORS FREQUENCY  PT (By licensed PT), OT (By licensed OT)     PT Frequency: Minimum 5x a week OT Frequency: Minimum 5x a week            Contractures Contractures Info: Not present    Additional Factors Info  Code Status, Allergies Code Status Info: DNR Allergies Info: Lomotil, Sulfa Antibiotics Amlodipine           Current Medications (01/22/2020):  This is the current hospital active medication list Current Facility-Administered Medications  Medication Dose Route Frequency Provider Last Rate Last Admin  . 0.9 %  sodium chloride infusion   Intravenous Continuous Aline August, MD 50 mL/hr at 01/22/20 1112 Rate Change at 01/22/20 1112  . acetaminophen (TYLENOL) tablet 650 mg  650 mg Oral Q6H PRN Isaac Bliss, Rayford Halsted, MD   650 mg at 01/22/20 1123   Or  . acetaminophen (TYLENOL) suppository 650 mg  650 mg Rectal Q6H PRN Isaac Bliss, Rayford Halsted, MD      . aspirin EC tablet 81 mg  81 mg Oral Daily Isaac Bliss, Rayford Halsted, MD   81 mg at 01/22/20 0758  . cefTRIAXone (ROCEPHIN) 2 g in sodium chloride 0.9 % 100 mL IVPB  2 g Intravenous Q24H Green, Terri L, RPH 200 mL/hr at 01/22/20 1345 2 g at 01/22/20 1345  . enoxaparin (LOVENOX) injection 30 mg  30 mg Subcutaneous Q24H Isaac Bliss, Rayford Halsted, MD   30 mg at 01/21/20 2248  .  guaiFENesin-dextromethorphan (ROBITUSSIN DM) 100-10 MG/5ML syrup 5 mL  5 mL Oral Q4H PRN Isaac Bliss, Rayford Halsted, MD   5 mL at 01/22/20 0801  . ipratropium-albuterol (DUONEB) 0.5-2.5 (3) MG/3ML nebulizer solution 3 mL  3 mL Nebulization Q4H PRN Alekh, Kshitiz, MD      . loperamide (IMODIUM) capsule 2 mg  2 mg Oral Q6H PRN Aline August, MD   2 mg at 01/21/20 1443  . metoprolol tartrate (LOPRESSOR) injection 2.5 mg  2.5 mg Intravenous Q6H PRN Alekh, Kshitiz, MD      . metoprolol tartrate (LOPRESSOR) tablet 75 mg  75 mg Oral BID Alekh,  Kshitiz, MD      . metroNIDAZOLE (FLAGYL) IVPB 500 mg  500 mg Intravenous Q8H Green, Terri L, RPH 100 mL/hr at 01/22/20 1526 500 mg at 01/22/20 1526  . ondansetron (ZOFRAN) tablet 4 mg  4 mg Oral Q6H PRN Isaac Bliss, Rayford Halsted, MD       Or  . ondansetron [facility]) injection 4 mg  4 mg Intravenous Q6H PRN Isaac Bliss, Rayford Halsted, MD         Discharge Medications: Please see discharge summary for a list of discharge medications.  Relevant Imaging Results:  Relevant Lab Results:   Additional Information SSN [id number]  Ross Ludwig, LCSW

## 2020-01-22 NOTE — Progress Notes (Signed)
OT Cancellation Note  Patient Details Name: Terri Acosta MRN: [id number] DOB: 1922/09/30   Cancelled Treatment:    Reason Eval/Treat Not Completed: Medical issues which prohibited therapy;Other (comment) Upon arrival patient in bed with HR still between 124-140s at rest, morning BP taken was 145/100. Patient reporting she has cough, and having difficulty breathing, O2 taken at 96% on room air. Spoke with RN will check back later in hopes of HR/BP more stable.  Delbert Phenix OT Pager: [place] 01/22/2020, 10:04 AM

## 2020-01-22 NOTE — Progress Notes (Signed)
Occupational Therapy Treatment Patient Details Name: Terri Acosta MRN: [id number] DOB: 15-Aug-1923 Today's Date: 01/22/2020    History of present illness 86 y.o. female who has a history of paroxysmal atrial fibrillation not on chronic anticoagulation, hyperlipidemia, hypertension admitted with 2-3 week history diarrhea.  Pt also found to have afib.   OT comments  Patient initially declining therapy, however with encouragement patient agreeable to sitting EOB for g/h. Patient require min A for bed mobility and mod A  for sit to stand and side step towards edge of bed with hand held assist x1. Patient with decreased activity tolerance, strength, safety, discharge recommendation updated to SNF as patient is mod I at baseline.   Follow Up Recommendations  SNF    Equipment Recommendations  None recommended by OT       Precautions / Restrictions Precautions Precautions: Fall Precaution Comments: monitor HR       Mobility Bed Mobility Overal bed mobility: Needs Assistance Bed Mobility: Supine to Sit;Sit to Supine     Supine to sit: Min assist;HOB elevated Sit to supine: Min assist   General bed mobility comments: increased time and effort, use of bed rail and min A to scoot L hip  Transfers Overall transfer level: Needs assistance Equipment used: 1 person hand held assist Transfers: Sit to/from Stand Sit to Stand: Mod assist         General transfer comment: mod A to boost up to standing    Balance Overall balance assessment: Needs assistance Sitting-balance support: No upper extremity supported;Feet supported Sitting balance-Leahy Scale: Good     Standing balance support: Single extremity supported;During functional activity Standing balance-Leahy Scale: Poor Standing balance comment: uses UE support (baseline)                           ADL either performed or assessed with clinical judgement   ADL Overall ADL's : Needs assistance/impaired      Grooming: Set up;Sitting;Wash/dry face;Oral care;Brushing hair                   Toilet Transfer: Moderate assistance;BSC Toilet Transfer Details (indicate cue type and reason): simulated with functional mobility, mod A with hand held assist, decreased balance, strength          Functional mobility during ADLs: Moderate assistance General ADL Comments: patient declines sitting up in chair "i can do it for 5-10 minutes but an hour is too long" patient feeling very fatigued               Cognition Arousal/Alertness: Awake/alert Behavior During Therapy: WFL for tasks assessed/performed Overall Cognitive Status: Within Functional Limits for tasks assessed                                                General Comments HR maintained between 99-110 during session     Pertinent Vitals/ Pain       Pain Assessment: Faces Faces Pain Scale: Hurts a little bit Pain Location: headache Pain Descriptors / Indicators: Aching Pain Intervention(s): Premedicated before session         Frequency  Min 2X/week        Progress Toward Goals  OT Goals(current goals can now be found in the care plan section)  Progress towards OT goals: Progressing toward goals  Acute Rehab OT  Goals Patient Stated Goal: to feel better OT Goal Formulation: With patient Time For Goal Achievement: 02/03/20 Potential to Achieve Goals: Good ADL Goals Pt Will Perform Grooming: with modified independence;standing;sitting Pt Will Perform Upper Body Dressing: with modified independence;sitting Pt Will Perform Lower Body Dressing: with modified independence;sit to/from stand;sitting/lateral leans Pt Will Transfer to Toilet: with modified independence;ambulating;regular height toilet;grab bars Pt Will Perform Toileting - Clothing Manipulation and hygiene: with modified independence;sit to/from stand;sitting/lateral leans  Plan Discharge plan needs to be updated       AM-PAC OT "6  Clicks" Daily Activity     Outcome Measure   Help from another person eating meals?: A Little Help from another person taking care of personal grooming?: A Little Help from another person toileting, which includes using toliet, bedpan, or urinal?: A Lot Help from another person bathing (including washing, rinsing, drying)?: A Lot Help from another person to put on and taking off regular upper body clothing?: A Little Help from another person to put on and taking off regular lower body clothing?: A Lot 6 Click Score: 15    End of Session  OT Visit Diagnosis: Other abnormalities of gait and mobility (R26.89)   Activity Tolerance Patient limited by fatigue   Patient Left in bed;with call bell/phone within reach;with bed alarm set   Nurse Communication Mobility status        Time: [id number] OT Time Calculation (min): 15 min  Charges: OT General Charges $OT Visit: 1 Visit OT Treatments $Self Care/Home Management : 8-22 mins  Delbert Phenix OT Pager: [phone]    Rosemary Holms 01/22/2020, 1:03 PM

## 2020-01-22 NOTE — Plan of Care (Signed)
Plan of care reviewed and discussed with the patient. 

## 2020-01-22 NOTE — TOC Progression Note (Signed)
Transition of Care [facility]) - Progression Note    Patient Details  Name: Terri Acosta MRN: [id number] Date of Birth: 03-08-1923  Transition of [facility]) CM/SW Contact  Ross Ludwig, [place] Phone Number: 01/22/2020, 4:37 PM  Clinical Narrative:    Patient is from [place], and would like SNF.  CSW faxed patient's information to different SNFs waiting for availability.      Expected Discharge Plan and Services  Patient plans to go to SNF for short term rehab, then return back to her independent living.                                               Social Determinants of Health (SDOH) Interventions    Readmission Risk Interventions No flowsheet data found.

## 2020-01-22 NOTE — Progress Notes (Signed)
Hemoglobin stable.Patient ID: Terri Acosta, female   DOB: 1922-12-01, 86 y.o.   MRN: [id number]  PROGRESS NOTE    Terri Acosta  [id number] DOB: [birth year] DOA: 01/18/2020 PCP: [name], [name], NP   Brief Narrative:  86 y.o.femalewho has a history of paroxysmal atrial fibrillation not on chronic anticoagulation for unknown reasons (question if due to age), hypertension presented with worsening diarrhea for the last 2 to 3 weeks.  In the ED, she was found to be tachycardic, hypokalemic.  Stool studies was negative for C. difficile.  She was admitted for further evaluation and management.  Assessment & Plan:   Probable colitis with possibility of sigmoid diverticulitis causing diarrhea -Presented with worsening diarrhea for the last 2 to 3 weeks.  Stool studies was negative for C. Difficile. -CT of the abdomen done on 01/20/2020 showed possible colitis versus less likely sigmoid diverticulitis along with early cirrhosis.  Cirrhosis can be followed up as an outpatient -Started on Rocephin and Flagyl for colitis on 01/21/2020.  Use Imodium as needed. -Diarrhea is improving. -Decrease normal saline to 50 cc an hour.  Paroxysmal A. fib with RVR -Still intermittently tachycardic.  Increase metoprolol to 75 mg twice a day.  Not on anticoagulation as an outpatient probably due to advanced age and increased risks.  Hypertension -Monitor blood pressure.  Metoprolol plan as above Hyzaar and held for now.  Normocytic anemia -Hemoglobin stable.  Monitor intermittently.  Generalized conditioning -ED/OT recommend SNF placement.  Social worker consulted.  DVT prophylaxis: Lovenox Code Status: DNR Family Communication: Spoke to patient at bedside.  Spoke to daughter/Karen on 01/21/2020 on phone. Disposition Plan: Status is: Inpatient  Remains inpatient appropriate because:IV treatments appropriate due to intensity of illness or inability to take PO.  Diarrhea improving; oral intake still poor.  Will  need SNF placement   Dispo: The patient is from: Independent living facility              Anticipated d/c is to: SNF              Anticipated d/c date is: 1-2 days              Patient currently is not medically stable to d/c.   Consultants: None  Procedures: Echo report pending  Antimicrobials: Rocephin and Flagyl from 01/21/2020 onwards   Subjective: Patient seen and examined at bedside.  Diarrhea is improving.  Still does not feel well enough.  Feels weak with poor appetite.  No overnight fever, nausea, vomiting.  Complains of mild shortness of breath.    Objective: Vitals:   01/21/20 2208 01/22/20 0421 01/22/20 0527 01/22/20 0530  BP: 127/89 (!) 153/83 (!) 137/94 (!) 145/100  Pulse: (!) 103 86 (!) 32 (!) 107  Resp: 20 20 20 20   Temp: 98.4 F (36.9 C) 98 F (36.7 C) 97.8 F (36.6 C) 97.8 F (36.6 C)  TempSrc: Oral Oral Oral   SpO2: 95% 93% 94% 95%  Weight:      Height:        Intake/Output Summary (Last 24 hours) at 01/22/2020 0739 Last data filed at 01/21/2020 2247 Gross per 24 hour  Intake 3835.19 ml  Output --  Net 3835.19 ml   Filed Weights   01/18/20 1114 01/18/20 1710  Weight: 45 kg 46.3 kg    Examination:  General exam: no acute distress.  Elderly female lying in bed. Respiratory system: Bilateral decreased breath sounds at bases with some scattered crackles.  Intermittently tachypneic Cardiovascular system:  S1 & S2 heard, currently rate controlled  gastrointestinal system: Abdomen is nondistended, soft and nontender.  Bowel sounds are heard  extremities: No cyanosis, clubbing; trace lower extremity edema present Central nervous system: Awake and alert.  No focal neurological deficits. Moving extremities Skin: No obvious rashes or ulcers Psychiatry: Flat affect    Data Reviewed: I have personally reviewed following labs and imaging studies  CBC: Recent Labs  Lab 01/15/20 0840 01/15/20 0840 01/18/20 1302 01/19/20 0549 01/20/20 0311  01/21/20 0359 01/22/20 0323  WBC 9.9   < > 10.2 9.8 8.5 8.4 9.4  NEUTROABS 7,544  --  8.6*  --  6.7 6.5 7.3  HGB 11.8   < > 11.5* 11.4* 9.9* 10.3* 10.3*  HCT 35.4   < > 35.8* 36.1 32.0* 32.9* 33.1*  MCV 84.5   < > 87.1 88.0 89.4 88.7 89.2  PLT 325   < > 327 339 290 314 307   < > = values in this interval not displayed.   Basic Metabolic Panel: Recent Labs  Lab 01/18/20 1302 01/18/20 1730 01/19/20 0549 01/20/20 0311 01/21/20 0359 01/22/20 0323  NA 133*  --  137 138 134* 134*  K 2.6*  --  4.0 4.1 3.8 3.8  CL 90*  --  99 104 100 103  CO2 31  --  27 26 23  21*  GLUCOSE 121*  --  125* 110* 100* 100*  BUN 37*  --  30* 26* 21 17  CREATININE 0.87  --  0.89 0.75 0.64 0.65  CALCIUM 8.1*  --  8.1* 7.8* 7.8* 7.9*  MG  --  1.6*  --  2.0 1.8 1.8   GFR: Estimated Creatinine Clearance: 31.4 mL/min (by C-G formula based on SCr of 0.65 mg/dL). Liver Function Tests: Recent Labs  Lab 01/15/20 0840 01/18/20 1302  AST 12 14*  ALT 7 10  ALKPHOS  --  47  BILITOT 0.7 1.0  PROT 5.9* 5.8*  ALBUMIN  --  3.0*   No results for input(s): LIPASE, AMYLASE in the last 168 hours. No results for input(s): AMMONIA in the last 168 hours. Coagulation Profile: Recent Labs  Lab 01/18/20 1302  INR 1.1   Cardiac Enzymes: No results for input(s): CKTOTAL, CKMB, CKMBINDEX, TROPONINI in the last 168 hours. BNP (last 3 results) No results for input(s): PROBNP in the last 8760 hours. HbA1C: No results for input(s): HGBA1C in the last 72 hours. CBG: No results for input(s): GLUCAP in the last 168 hours. Lipid Profile: No results for input(s): CHOL, HDL, LDLCALC, TRIG, CHOLHDL, LDLDIRECT in the last 72 hours. Thyroid Function Tests: Recent Labs    01/19/20 0820 01/19/20 0859  TSH 2.195  --   FREET4  --  1.35*   Anemia Panel: Recent Labs    01/21/20 0359  VITAMINB12 798  FOLATE 11.2  FERRITIN 96  TIBC 200*  IRON 48   Sepsis Labs: No results for input(s): PROCALCITON, LATICACIDVEN in the  last 168 hours.  Recent Results (from the past 240 hour(s))  SARS Coronavirus 2 by RT PCR (hospital order, performed in [facility] hospital lab) Nasopharyngeal Nasopharyngeal Swab     Status: None   Collection Time: 01/18/20  2:56 PM   Specimen: Nasopharyngeal Swab  Result Value Ref Range Status   SARS Coronavirus 2 NEGATIVE NEGATIVE Final    Comment: (NOTE) SARS-CoV-2 target nucleic acids are NOT DETECTED. The SARS-CoV-2 RNA is generally detectable in upper and lower respiratory specimens during the acute phase of infection.  The lowest concentration of SARS-CoV-2 viral copies this assay can detect is 250 copies / mL. A negative result does not preclude SARS-CoV-2 infection and should not be used as the sole basis for treatment or other patient management decisions.  A negative result may occur with improper specimen collection / handling, submission of specimen other than nasopharyngeal swab, presence of viral mutation(s) within the areas targeted by this assay, and inadequate number of viral copies (<250 copies / mL). A negative result must be combined with clinical observations, patient history, and epidemiological information. Fact Sheet for Patients:   StrictlyIdeas.no Fact Sheet for Healthcare Providers: BankingDealers.co.za This test is not yet approved or cleared  by the [place] FDA and has been authorized for detection and/or diagnosis of SARS-CoV-2 by FDA under an Emergency Use Authorization (EUA).  This EUA will remain in effect (meaning this test can be used) for the duration of the COVID-19 declaration under Section 564(b)(1) of the Act, 21 U.S.C. section 360bbb-3(b)(1), unless the authorization is terminated or revoked sooner. Performed at [facility], [place] [address] [place], [place] [address]   Gastrointestinal Panel by PCR , Stool     Status: None   Collection Time: 01/19/20  3:00 AM    Specimen: Stool  Result Value Ref Range Status   Campylobacter species NOT DETECTED NOT DETECTED Final   Plesimonas shigelloides NOT DETECTED NOT DETECTED Final   Salmonella species NOT DETECTED NOT DETECTED Final   Yersinia enterocolitica NOT DETECTED NOT DETECTED Final   Vibrio species NOT DETECTED NOT DETECTED Final   Vibrio cholerae NOT DETECTED NOT DETECTED Final   Enteroaggregative E coli (EAEC) NOT DETECTED NOT DETECTED Final   Enteropathogenic E coli (EPEC) NOT DETECTED NOT DETECTED Final   Enterotoxigenic E coli (ETEC) NOT DETECTED NOT DETECTED Final   Shiga like toxin producing E coli (STEC) NOT DETECTED NOT DETECTED Final   Shigella/Enteroinvasive E coli (EIEC) NOT DETECTED NOT DETECTED Final   Cryptosporidium NOT DETECTED NOT DETECTED Final   Cyclospora cayetanensis NOT DETECTED NOT DETECTED Final   Entamoeba histolytica NOT DETECTED NOT DETECTED Final   Giardia lamblia NOT DETECTED NOT DETECTED Final   Adenovirus F40/41 NOT DETECTED NOT DETECTED Final   Astrovirus NOT DETECTED NOT DETECTED Final   Norovirus GI/GII NOT DETECTED NOT DETECTED Final   Rotavirus A NOT DETECTED NOT DETECTED Final   Sapovirus (I, II, IV, and V) NOT DETECTED NOT DETECTED Final    Comment: Performed at [facility], [place] [place], [place] [address]  C Difficile Quick Screen w PCR reflex     Status: None   Collection Time: 01/19/20  3:00 AM   Specimen: STOOL  Result Value Ref Range Status   C Diff antigen NEGATIVE NEGATIVE Final   C Diff toxin NEGATIVE NEGATIVE Final   C Diff interpretation No C. difficile detected.  Final    Comment: Performed at [facility], [place] [address], [place], [place] [address]  Urine Culture     Status: Abnormal   Collection Time: 01/19/20 11:30 AM   Specimen: Urine, Random  Result Value Ref Range Status   Specimen Description   Final    URINE, RANDOM Performed at [place] [address],  [place], [place] [address]    Special Requests   Final    NONE Performed at [facility], [place] [address], [place], [place] [address]    Culture (A)  Final    40,000 COLONIES/mL  MULTIPLE SPECIES PRESENT, SUGGEST RECOLLECTION   Report Status 01/20/2020 FINAL  Final         Radiology Studies: CT ABDOMEN PELVIS W CONTRAST  Result Date: 01/20/2020 CLINICAL DATA:  86 year old female with diarrhea. EXAM: CT ABDOMEN AND PELVIS WITH CONTRAST TECHNIQUE: Multidetector CT imaging of the abdomen and pelvis was performed using the standard protocol following bolus administration of intravenous contrast. CONTRAST:  113mL OMNIPAQUE IOHEXOL 300 MG/ML  SOLN COMPARISON:  Abdominal radiograph dated 01/19/2020 FINDINGS: Lower chest: Small bilateral pleural effusions with associated partial compressive atelectasis of the lower lobes. Pneumonia is not excluded. Clinical correlation is recommended. There is mild cardiomegaly. Multi vessel coronary vascular calcification. No intra-abdominal free air. Diffuse mesenteric edema and small perihepatic ascites. Hepatobiliary: Minimal irregularity of the liver contour may represent early changes of cirrhosis. Clinical correlation is recommended. No intrahepatic biliary ductal dilatation. The gallbladder is unremarkable. Pancreas: Unremarkable. No pancreatic ductal dilatation or surrounding inflammatory changes. Spleen: Normal in size without focal abnormality. Adrenals/Urinary Tract: The adrenal glands are unremarkable. There is no hydronephrosis on either side. There is symmetric enhancement and excretion of contrast by both kidneys. There is a 2 cm left renal interpolar cyst and several subcentimeter hypodensities which are too small to characterize. The visualized ureters and urinary bladder appear unremarkable. Stomach/Bowel: There is scattered sigmoid diverticula. Diffuse thickening of the distal colon primarily involving the rectosigmoid most consistent with  colitis. Clinical correlation is recommended. There is no bowel obstruction. The appendix is normal. Vascular/Lymphatic: Advanced aortoiliac atherosclerotic disease. There is a 3.5 cm partially thrombosed infrarenal abdominal aortic aneurysm. The IVC is unremarkable. No portal venous gas. There is no adenopathy. Reproductive: The uterus is grossly unremarkable. Other: Diffuse subcutaneous edema and anasarca. Musculoskeletal: Osteopenia with degenerative changes of the spine. No acute osseous pathology. 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 IMPRESSION: 1. Findings most likely represent colitis and less likely sigmoid diverticulitis. Clinical correlation is recommended. No bowel obstruction. Normal appendix. 2. Small bilateral pleural effusions, small ascites and anasarca. 3. 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 4. A 3.5 cm partially thrombosed infrarenal abdominal aortic aneurysm. 5. Aortic Atherosclerosis (ICD10-I70.0). Electronically Signed   By: Anner Crete M.D.   On: 01/20/2020 20:29        Scheduled Meds:  aspirin EC  81 mg Oral Daily   enoxaparin (LOVENOX) injection  30 mg Subcutaneous Q24H   metoprolol tartrate  50 mg Oral BID   Continuous Infusions:  sodium chloride 100 mL/hr at 01/22/20 0133   cefTRIAXone (ROCEPHIN)  IV     metronidazole 500 mg (01/22/20 0713)          Aline August, MD [facility] 01/22/2020, 7:39 AM

## 2020-01-22 NOTE — Care Management Important Message (Signed)
Important Message  Patient Details  Name: Terri Acosta MRN: [id number] Date of Birth: [birth year]   Medicare Important Message Given:      Document given to Evette Cristal  to give to the patient.    Crista Luria 01/22/2020, 10:27 AM

## 2020-01-23 DIAGNOSIS — I10 Essential (primary) hypertension: Secondary | ICD-10-CM

## 2020-01-23 DIAGNOSIS — I4891 Unspecified atrial fibrillation: Secondary | ICD-10-CM

## 2020-01-23 LAB — BASIC METABOLIC PANEL WITH GFR
Anion gap: 8 (ref 5–15)
BUN: 16 mg/dL (ref 8–23)
CO2: 20 mmol/L — ABNORMAL LOW (ref 22–32)
Calcium: 7.9 mg/dL — ABNORMAL LOW (ref 8.9–10.3)
Chloride: 106 mmol/L (ref 98–111)
Creatinine, Ser: 0.62 mg/dL (ref 0.44–1.00)
GFR calc Af Amer: >60 mL/min
GFR calc non Af Amer: >60 mL/min
Glucose, Bld: 103 mg/dL — ABNORMAL HIGH (ref 70–99)
Potassium: 3.7 mmol/L (ref 3.5–5.1)
Sodium: 134 mmol/L — ABNORMAL LOW (ref 135–145)

## 2020-01-23 LAB — CBC WITH DIFFERENTIAL/PLATELET
Abs Immature Granulocytes: 0.12 K/uL — ABNORMAL HIGH (ref 0.00–0.07)
Basophils Absolute: 0 K/uL (ref 0.0–0.1)
Basophils Relative: 0 %
Eosinophils Absolute: 0.1 K/uL (ref 0.0–0.5)
Eosinophils Relative: 1 %
HCT: 34 % — ABNORMAL LOW (ref 36.0–46.0)
Hemoglobin: 10.5 g/dL — ABNORMAL LOW (ref 12.0–15.0)
Immature Granulocytes: 1 %
Lymphocytes Relative: 11 %
Lymphs Abs: 1.2 K/uL (ref 0.7–4.0)
MCH: 27.8 pg (ref 26.0–34.0)
MCHC: 30.9 g/dL (ref 30.0–36.0)
MCV: 89.9 fL (ref 80.0–100.0)
Monocytes Absolute: 0.7 K/uL (ref 0.1–1.0)
Monocytes Relative: 7 %
Neutro Abs: 8.6 K/uL — ABNORMAL HIGH (ref 1.7–7.7)
Neutrophils Relative %: 80 %
Platelets: 300 K/uL (ref 150–400)
RBC: 3.78 MIL/uL — ABNORMAL LOW (ref 3.87–5.11)
RDW: 14.2 % (ref 11.5–15.5)
WBC: 10.7 K/uL — ABNORMAL HIGH (ref 4.0–10.5)
nRBC: 0 % (ref 0.0–0.2)

## 2020-01-23 LAB — MAGNESIUM: Magnesium: 1.7 mg/dL (ref 1.7–2.4)

## 2020-01-23 MED ORDER — APIXABAN 2.5 MG PO TABS
2.5000 mg | ORAL_TABLET | Freq: Two times a day (BID) | ORAL | Status: DC
  Administered 2020-01-23 – 2020-01-26 (×6): 2.5 mg via ORAL
  Filled 2020-01-23 (×6): qty 1

## 2020-01-23 MED ORDER — METOPROLOL TARTRATE 25 MG PO TABS
25.0000 mg | ORAL_TABLET | Freq: Once | ORAL | Status: AC
  Administered 2020-01-23: 25 mg via ORAL
  Filled 2020-01-23: qty 1

## 2020-01-23 MED ORDER — METOPROLOL TARTRATE 50 MG PO TABS
100.0000 mg | ORAL_TABLET | Freq: Two times a day (BID) | ORAL | Status: DC
  Administered 2020-01-23 – 2020-01-26 (×6): 100 mg via ORAL
  Filled 2020-01-23 (×6): qty 2

## 2020-01-23 NOTE — Progress Notes (Signed)
Physical Therapy Treatment Patient Details Name: Terri Acosta MRN: [id number] DOB: [birth year] Today's Date: 01/23/2020    History of Present Illness 86 y.o. female who has a history of paroxysmal atrial fibrillation not on chronic anticoagulation, hyperlipidemia, hypertension admitted with 2-3 week history diarrhea.  Pt also found to have afib.    PT Comments    Pt tolerates short, slow steps in room with RW with difficulty due to LLE being shorter and fear of falling. Pt able to perform seated therapeutic exercises with cues for form and increased time due to fatigue. Pt's HR 110-132bpm during treatment session, reports "racing" sensation during steps in room that subsides with seated rest. Pt continues to be limited with ambulation, declines transfer to recliner due to wanting to rest in bed. Patient will benefit from continued physical therapy in hospital and recommendations below to increase strength, balance, endurance for safe ADLs and gait.   Follow Up Recommendations  SNF;Supervision for mobility/OOB     Equipment Recommendations  None recommended by PT    Recommendations for Other Services       Precautions / Restrictions Precautions Precautions: Fall Precaution Comments: monitor HR Restrictions Weight Bearing Restrictions: No    Mobility  Bed Mobility Overal bed mobility: Needs Assistance Bed Mobility: Supine to Sit;Sit to Supine  Supine to sit: Min assist;HOB elevated Sit to supine: Min guard  General bed mobility comments: increased time, use of bedrail and min assist to scoot to EOB; min guard to return to supine with increased time  Transfers Overall transfer level: Needs assistance Equipment used: Rolling walker (2 wheeled) Transfers: Sit to/from Stand Sit to Stand: Min assist  General transfer comment: min assist to power up from EOB with BUE assisting, weight through heels requiring cues for flat feet, good steadiness upon  standing  Ambulation/Gait Ambulation/Gait assistance: Min assist Gait Distance (Feet): 4 Feet Assistive device: Rolling walker (2 wheeled) Gait Pattern/deviations: Step-to pattern;Decreased stride length Gait velocity: decreased   General Gait Details: slow, short steps with RW in room, LLE shorter and fear of falling requiring cues for maneuvering RW safely and slowly, declines further ambulation 2* being tired   Chief Strategy Officer    Modified Rankin (Stroke Patients Only)       Balance Overall balance assessment: Needs assistance Sitting-balance support: No upper extremity supported;Feet supported Sitting balance-Leahy Scale: Good Sitting balance - Comments: seated EOB   Standing balance support: During functional activity;Bilateral upper extremity supported Standing balance-Leahy Scale: Poor Standing balance comment: reliant on RW       Cognition Arousal/Alertness: Awake/alert Behavior During Therapy: WFL for tasks assessed/performed Overall Cognitive Status: Within Functional Limits for tasks assessed                 Exercises General Exercises - Lower Extremity Long Arc Quad: Seated;Both;5 reps Hip Flexion/Marching: Seated;Both;5 reps    General Comments General comments (skin integrity, edema, etc.): HR 111 at beginning of session, peaked at 132 with steps in room, returned to 110 at EOS      Pertinent Vitals/Pain Pain Assessment: No/denies pain    Home Living                      Prior Function            PT Goals (current goals can now be found in the care plan section) Acute Rehab PT Goals Patient Stated Goal: to feel  better PT Goal Formulation: With patient Time For Goal Achievement: 02/03/20 Potential to Achieve Goals: Good Progress towards PT goals: Progressing toward goals    Frequency    Min 2X/week      PT Plan Current plan remains appropriate    Co-evaluation              AM-PAC  PT "6 Clicks" Mobility   Outcome Measure  Help needed turning from your back to your side while in a flat bed without using bedrails?: A Little Help needed moving from lying on your back to sitting on the side of a flat bed without using bedrails?: A Little Help needed moving to and from a bed to a chair (including a wheelchair)?: A Little Help needed standing up from a chair using your arms (e.g., wheelchair or bedside chair)?: A Little Help needed to walk in hospital room?: A Little Help needed climbing 3-5 steps with a railing? : A Lot 6 Click Score: 17    End of Session   Activity Tolerance: Patient limited by fatigue Patient left: in bed;with call bell/phone within reach;with bed alarm set Nurse Communication: Mobility status PT Visit Diagnosis: Difficulty in walking, not elsewhere classified (R26.2);Muscle weakness (generalized) (M62.81)     Time: 4461-9012 PT Time Calculation (min) (ACUTE ONLY): 18 min  Charges:  $Therapeutic Exercise: 8-22 mins                      Tori Rasheeda Mulvehill PT, DPT 01/23/20, 1:15 PM

## 2020-01-23 NOTE — Progress Notes (Signed)
Hemoglobin stable.Patient ID: Terri Acosta, female   DOB: Jul 24, 1923, 86 y.o.   MRN: [id number]  PROGRESS NOTE    Terri Acosta  MGQ:[id number] DOB: [birth year] DOA: 01/18/2020 PCP: [name], [name], NP   Brief Narrative:  86 y.o.femalewho has a history of paroxysmal atrial fibrillation not on chronic anticoagulation for unknown reasons (question if due to age), hypertension presented with worsening diarrhea for the last 2 to 3 weeks.  In the ED, she was found to be tachycardic, hypokalemic.  Stool studies was negative for C. difficile.  She was admitted for further evaluation and management.  Assessment & Plan:   Probable colitis with possibility of sigmoid diverticulitis causing diarrhea -Presented with worsening diarrhea for the last 2 to 3 weeks.  Stool studies was negative for C. Difficile. -CT of the abdomen done on 01/20/2020 showed possible colitis versus less likely sigmoid diverticulitis along with early cirrhosis.  Cirrhosis can be followed up as an outpatient -Started on Rocephin and Flagyl for colitis on 01/21/2020.  Use Imodium as needed. -Diarrhea is improving. -DC IV fluids.  Paroxysmal A. fib with RVR -Still intermittently tachycardic.  Increase metoprolol to 100 mg twice a day.  Not on anticoagulation as an outpatient probably due to advanced age and increased risks. -Consult cardiology  Hypertension -Monitor blood pressure.  Metoprolol plan as above; Hyzaar held for now.  Normocytic anemia -Hemoglobin stable.  Monitor intermittently.  Generalized conditioning -PT/OT recommend SNF placement.  Social worker consulted.  DVT prophylaxis: Lovenox Code Status: DNR Family Communication: Spoke to patient at bedside.  Spoke to daughter/Karen on 01/23/2020 on phone. Disposition Plan: Status is: Inpatient  Remains inpatient appropriate because:IV treatments appropriate due to intensity of illness or inability to take PO.  Diarrhea improving; oral intake still poor.  Still  intermittently tachycardic and will need cardiology evaluation.  Will need SNF placement   Dispo: The patient is from: Independent living facility              Anticipated d/c is to: SNF              Anticipated d/c date is: 1-2 days              Patient currently is not medically stable to d/c.   Consultants: None  Procedures: Echo   1. Patient in atrial fibrillation with RVR. Left ventricular ejection  fraction, by estimation, is 55 to 60%. The left ventricle has normal  function. The left ventricle has no regional wall motion abnormalities.  Left ventricular diastolic parameters are  indeterminate.  2. Right ventricular systolic function is normal. The right ventricular  size is normal. There is moderately elevated pulmonary artery systolic  pressure. The estimated right ventricular systolic pressure is 26.7 mmHg.  3. Left atrial size was moderately dilated.  4. Right atrial size was moderately dilated.  5. Mild restriction of the posterior MV leaflet. There is reverberation  artificat in the LA from clacified MV apparatus. . The mitral valve is  degenerative. Moderate mitral valve regurgitation. No evidence of mitral  stenosis.  6. Tricuspid valve regurgitation is moderate.  7. The aortic valve is tricuspid. Aortic valve regurgitation is not  visualized. Mild aortic valve sclerosis is present, with no evidence of  aortic valve stenosis.  8. The inferior vena cava is dilated in size with >50% respiratory  variability, suggesting right atrial pressure of 8 mmHg.   Antimicrobials: Rocephin and Flagyl from 01/21/2020 onwards   Subjective: Patient seen and examined at bedside.  Still has intermittent diarrhea.  Still feels weak but slightly better compared to yesterday.  No chest pain, worsening shortness of breath or fever reported.  Objective: Vitals:   01/22/20 0530 01/22/20 1407 01/22/20 2049 01/23/20 0604  BP: (!) 145/100 (!) 154/109 (!) 144/98 137/89  Pulse: (!)  107 (!) 106 (!) 105 93  Resp: 20 17 18 18   Temp: 97.8 F (36.6 C) 97.6 F (36.4 C) 97.7 F (36.5 C) 97.6 F (36.4 C)  TempSrc:  Oral Oral Oral  SpO2: 95% 96% 96% 95%  Weight:      Height:        Intake/Output Summary (Last 24 hours) at 01/23/2020 0750 Last data filed at 01/23/2020 0600 Gross per 24 hour  Intake 2688.78 ml  Output --  Net 2688.78 ml   Filed Weights   01/18/20 1114 01/18/20 1710  Weight: 45 kg 46.3 kg    Examination:  General exam: No distress.  Elderly female lying in bed. Respiratory system: Bilateral decreased breath sounds at bases with some crackles  cardiovascular system: Tachycardia present, S1-S2 heard gastrointestinal system: Abdomen is nondistended, soft and nontender.  Normal bowel sounds are heard  extremities: No cyanosis, clubbing; bilateral lower extremity mild edema present Central nervous system: Awake, poor historian no focal neurological deficits. Moving extremities Skin: No obvious rashes or ulcers Psychiatry: Has flat affect    Data Reviewed: I have personally reviewed following labs and imaging studies  CBC: Recent Labs  Lab 01/18/20 1302 01/18/20 1302 01/19/20 0549 01/20/20 0311 01/21/20 0359 01/22/20 0323 01/23/20 0336  WBC 10.2   < > 9.8 8.5 8.4 9.4 10.7*  NEUTROABS 8.6*  --   --  6.7 6.5 7.3 8.6*  HGB 11.5*   < > 11.4* 9.9* 10.3* 10.3* 10.5*  HCT 35.8*   < > 36.1 32.0* 32.9* 33.1* 34.0*  MCV 87.1   < > 88.0 89.4 88.7 89.2 89.9  PLT 327   < > 339 290 314 307 300   < > = values in this interval not displayed.   Basic Metabolic Panel: Recent Labs  Lab 01/18/20 1302 01/18/20 1730 01/19/20 0549 01/20/20 0311 01/21/20 0359 01/22/20 0323 01/23/20 0336  NA   < >  --  137 138 134* 134* 134*  K   < >  --  4.0 4.1 3.8 3.8 3.7  CL   < >  --  99 104 100 103 106  CO2   < >  --  27 26 23  21* 20*  GLUCOSE   < >  --  125* 110* 100* 100* 103*  BUN   < >  --  30* 26* 21 17 16   CREATININE   < >  --  0.89 0.75 0.64 0.65 0.62   CALCIUM   < >  --  8.1* 7.8* 7.8* 7.9* 7.9*  MG  --  1.6*  --  2.0 1.8 1.8 1.7   < > = values in this interval not displayed.   GFR: Estimated Creatinine Clearance: 31.4 mL/min (by C-G formula based on SCr of 0.62 mg/dL). Liver Function Tests: Recent Labs  Lab 01/18/20 1302  AST 14*  ALT 10  ALKPHOS 47  BILITOT 1.0  PROT 5.8*  ALBUMIN 3.0*   No results for input(s): LIPASE, AMYLASE in the last 168 hours. No results for input(s): AMMONIA in the last 168 hours. Coagulation Profile: Recent Labs  Lab 01/18/20 1302  INR 1.1   Cardiac Enzymes: No results for input(s): CKTOTAL, CKMB, CKMBINDEX,  TROPONINI in the last 168 hours. BNP (last 3 results) No results for input(s): PROBNP in the last 8760 hours. HbA1C: No results for input(s): HGBA1C in the last 72 hours. CBG: No results for input(s): GLUCAP in the last 168 hours. Lipid Profile: No results for input(s): CHOL, HDL, LDLCALC, TRIG, CHOLHDL, LDLDIRECT in the last 72 hours. Thyroid Function Tests: No results for input(s): TSH, T4TOTAL, FREET4, T3FREE, THYROIDAB in the last 72 hours. Anemia Panel: Recent Labs    01/21/20 0359  VITAMINB12 798  FOLATE 11.2  FERRITIN 96  TIBC 200*  IRON 48   Sepsis Labs: No results for input(s): PROCALCITON, LATICACIDVEN in the last 168 hours.  Recent Results (from the past 240 hour(s))  SARS Coronavirus 2 by RT PCR (hospital order, performed in [facility] hospital lab) Nasopharyngeal Nasopharyngeal Swab     Status: None   Collection Time: 01/18/20  2:56 PM   Specimen: Nasopharyngeal Swab  Result Value Ref Range Status   SARS Coronavirus 2 NEGATIVE NEGATIVE Final    Comment: (NOTE) SARS-CoV-2 target nucleic acids are NOT DETECTED. The SARS-CoV-2 RNA is generally detectable in upper and lower respiratory specimens during the acute phase of infection. The lowest concentration of SARS-CoV-2 viral copies this assay can detect is 250 copies / mL. A negative result does not preclude  SARS-CoV-2 infection and should not be used as the sole basis for treatment or other patient management decisions.  A negative result may occur with improper specimen collection / handling, submission of specimen other than nasopharyngeal swab, presence of viral mutation(s) within the areas targeted by this assay, and inadequate number of viral copies (<250 copies / mL). A negative result must be combined with clinical observations, patient history, and epidemiological information. Fact Sheet for Patients:   StrictlyIdeas.no Fact Sheet for Healthcare Providers: BankingDealers.co.za This test is not yet approved or cleared  by the [place] FDA and has been authorized for detection and/or diagnosis of SARS-CoV-2 by FDA under an Emergency Use Authorization (EUA).  This EUA will remain in effect (meaning this test can be used) for the duration of the COVID-19 declaration under Section 564(b)(1) of the Act, 21 U.S.C. section 360bbb-3(b)(1), unless the authorization is terminated or revoked sooner. Performed at [facility], [place] [address], [place], [place] [address]   Gastrointestinal Panel by PCR , Stool     Status: None   Collection Time: 01/19/20  3:00 AM   Specimen: Stool  Result Value Ref Range Status   Campylobacter species NOT DETECTED NOT DETECTED Final   Plesimonas shigelloides NOT DETECTED NOT DETECTED Final   Salmonella species NOT DETECTED NOT DETECTED Final   Yersinia enterocolitica NOT DETECTED NOT DETECTED Final   Vibrio species NOT DETECTED NOT DETECTED Final   Vibrio cholerae NOT DETECTED NOT DETECTED Final   Enteroaggregative E coli (EAEC) NOT DETECTED NOT DETECTED Final   Enteropathogenic E coli (EPEC) NOT DETECTED NOT DETECTED Final   Enterotoxigenic E coli (ETEC) NOT DETECTED NOT DETECTED Final   Shiga like toxin producing E coli (STEC) NOT DETECTED NOT DETECTED Final   Shigella/Enteroinvasive E  coli (EIEC) NOT DETECTED NOT DETECTED Final   Cryptosporidium NOT DETECTED NOT DETECTED Final   Cyclospora cayetanensis NOT DETECTED NOT DETECTED Final   Entamoeba histolytica NOT DETECTED NOT DETECTED Final   Giardia lamblia NOT DETECTED NOT DETECTED Final   Adenovirus F40/41 NOT DETECTED NOT DETECTED Final   Astrovirus NOT DETECTED NOT DETECTED Final   Norovirus GI/GII NOT DETECTED NOT DETECTED  Final   Rotavirus A NOT DETECTED NOT DETECTED Final   Sapovirus (I, II, IV, and V) NOT DETECTED NOT DETECTED Final    Comment: Performed at [facility], [place], [place] [address]  C Difficile Quick Screen w PCR reflex     Status: None   Collection Time: 01/19/20  3:00 AM   Specimen: STOOL  Result Value Ref Range Status   C Diff antigen NEGATIVE NEGATIVE Final   C Diff toxin NEGATIVE NEGATIVE Final   C Diff interpretation No C. difficile detected.  Final    Comment: Performed at [facility], [place] [address], [place], [place] [address]  Urine Culture     Status: Abnormal   Collection Time: 01/19/20 11:30 AM   Specimen: Urine, Random  Result Value Ref Range Status   Specimen Description   Final    URINE, RANDOM Performed at [place] [address], [place], [place] [address]    Special Requests   Final    NONE Performed at [facility], [place] [address], [place], [place] [address]    Culture (A)  Final    40,000 COLONIES/mL MULTIPLE SPECIES PRESENT, SUGGEST RECOLLECTION   Report Status 01/20/2020 FINAL  Final         Radiology Studies: No results found.      Scheduled Meds: . aspirin EC  81 mg Oral Daily  . enoxaparin (LOVENOX) injection  30 mg Subcutaneous Q24H  . metoprolol tartrate  75 mg Oral BID   Continuous Infusions: . sodium chloride 50 mL/hr at 01/22/20 1830  . cefTRIAXone (ROCEPHIN)  IV 2 g (01/22/20 1345)  . metronidazole 500 mg (01/23/20 0559)          Aline August, MD [facility] 01/23/2020, 7:50 AM

## 2020-01-23 NOTE — Progress Notes (Signed)
Patient with approximately 150 mls urine output on 7 a to 7 p shift, bladder scan performed, no urine detected in bladder.  MD notified.

## 2020-01-23 NOTE — Consult Note (Addendum)
Cardiology Consultation:   Patient ID: Terri Acosta; [id number]; July 20, 1923   Admit date: 01/18/2020 Date of Consult: 01/23/2020  Primary Care Provider: [name], [name], NP Primary Cardiologist: No primary care provider on file. New Primary Electrophysiologist:  None   Patient Profile:   Terri Acosta is a 86 y.o. female with a hx of PAF not on anticoag, PACs, HTN, OA, asthma, was admitted 05/30 with diarrhea, weakness, low K+, rapid Afib. HR has remained elevated, cards asked to see by Dr Starla Link.  History of Present Illness:   Terri Acosta does not have a cardiologist.  She thinks she has been in atrial fibrillation 6 or 7 years.  This has been managed by her PCP.  She does not ever remember having a discussion with her PCP about blood thinners and has never been on them.  She has no history of syncope or presyncope and has never fallen.  She is generally not aware of her heart rate, but will feel it race at times.  She is more aware of her heart rate at night when she is lying in bed.  She does not usually notice it during the day.  When her heart rate is elevated, she never gets chest pain, shortness of breath, or lightheaded.  She still does not feel very well and her GI symptoms have not improved very much.  She does not wake with lower extremity edema, denies orthopnea or PND.  No history of chest pain.   Past Medical History:  Diagnosis Date  . Abnormal uterine bleeding   . Actinic keratosis 02/13/2012  . Acute bronchospasm 10/31/2011  . Cancer ([facility]) 04/2008   Skin cancer of low back [name], MD  . Cataract   . Disorder of bone and cartilage, unspecified 02/18/2007  . Dizziness and giddiness 08/30/2010  . Dysmenorrhea   . Edema 12/19/2011  . Endometriosis   . 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 Osteoarthrosis, unspecified whether generalized or localized, unspecified site 08/20/2006  . Osteoporosis   . Other abnormal blood chemistry 03/14/2011  . Other and  unspecified hyperlipidemia 10/18/2010  . Palpitations 02/13/2012  . Postmenopausal bleeding 08/30/2010  . Shortness of breath 11/28/2011  . Supraventricular premature beats 05/23/2011  . Unspecified essential hypertension 08/20/2006    Past Surgical History:  Procedure Laterality Date  . CARPAL TUNNEL RELEASE  2004   S. Norris MD  . CATARACT EXTRACTION W/ INTRAOCULAR LENS IMPLANT Right 2010  . CATARACT EXTRACTION W/ INTRAOCULAR LENS IMPLANT Left 10/2012  . DILATION AND CURETTAGE OF UTERUS    . EYE SURGERY Right 07/2009   cataract extraction/IOLI [name], MD  . KNEE ARTHROSCOPY Right 2008   Torn meniscus, Dr. Veverly Fells     Prior to Admission medications   Medication Sig Start Date End Date Taking? Authorizing Provider  aspirin EC 81 MG tablet Take 1 tablet (81 mg total) by mouth daily. 07/18/17  Yes Blanchie Serve, MD  losartan-hydrochlorothiazide (HYZAAR) 100-25 MG tablet TAKE ONE TABLET BY MOUTH DAILY TO CONTROL FOR BLOOD PRESSURE Patient taking differently: Take 1 tablet by mouth daily.  10/31/19  Yes [name], [name], NP  metoprolol tartrate (LOPRESSOR) 50 MG tablet TAKE 1 TABLET BY MOUTH  TWICE A DAY TO HELP HEART  RHYTHM AND BLOOD PRESSURE Patient taking differently: Take 50 mg by mouth 2 (two) times daily.  12/15/19  Yes [name], [name], NP  naproxen (NAPROSYN) 250 MG tablet Take 250 mg by mouth 2 (two) times daily with a meal.   Yes  [provider]  potassium chloride SA (KLOR-CON) 20 MEQ tablet Take 20 mEq by mouth 3 (three) times daily. Take x7 days and then take 1 qd 01/15/20  Yes [provider]    Inpatient Medications: Scheduled Meds: . aspirin EC  81 mg Oral Daily  . enoxaparin (LOVENOX) injection  30 mg Subcutaneous Q24H  . metoprolol tartrate  100 mg Oral BID   Continuous Infusions: . cefTRIAXone (ROCEPHIN)  IV 2 g (01/22/20 1345)  . metronidazole 500 mg (01/23/20 0559)   PRN Meds: acetaminophen **OR** acetaminophen, guaiFENesin-dextromethorphan,  ipratropium-albuterol, loperamide, metoprolol tartrate, ondansetron **OR** ondansetron (ZOFRAN) IV  Allergies:    Allergies  Allergen Reactions  . Lomotil [Diphenoxylate] Nausea And Vomiting  . Sulfa Antibiotics Other (See Comments)    Unknown   . Amlodipine Swelling    Social History:   Social History   Socioeconomic History  . Marital status: Widowed    Spouse name: Not on file  . Number of children: Not on file  . Years of education: Not on file  . Highest education level: Not on file  Occupational History  . Not on file  Tobacco Use  . Smoking status: Former Smoker    Quit date: 02/11/1978    Years since quitting: 41.9  . Smokeless tobacco: Never Used  Substance and Sexual Activity  . Alcohol use: Yes  . Drug use: No  . Sexual activity: Not Currently  Other Topics Concern  . Not on file  Social History Narrative   Lives alone in a apartment at [facility] [place] in the independent living area since 2007   The patient is not exercising regularly, walking with walker   No specific diet.   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   She has a living will, POA   Former smoker, stopped 1979   Alcohol - one glass of wine at night   Social Determinants of Health   Financial Resource Strain:   . Difficulty of Paying Living Expenses:   Food Insecurity:   . Worried About Charity fundraiser in the Last Year:   . Arboriculturist in the Last Year:   Transportation Needs:   . Film/video editor (Medical):   [name] Lack of Transportation (Non-Medical):   Physical Activity:   . Days of Exercise per Week:   . Minutes of Exercise per Session:   Stress:   . Feeling of Stress :   Social Connections:   . Frequency of Communication with Friends and Family:   . Frequency of Social Gatherings with Friends and Family:   . Attends Religious Services:   . Active Member of Clubs or Organizations:   . Attends Archivist Meetings:   [name] Marital Status:   Intimate Partner  Violence:   . Fear of Current or Ex-Partner:   . Emotionally Abused:   [name] Physically Abused:   . Sexually Abused:     Family History:   Family History  Problem Relation Age of Onset  . Diabetes Brother   . COPD Brother    Family Status:  Family Status  Relation Name Status  . Daughter Darcel Bayley  . Mother  Deceased at age 85       natural causes  . Father  Deceased at age 93       natural causes  . Sister Tasia Catchings  . Brother Lake Bells Deceased at age 35       COPD  . Son Herbie Baltimore  Alive  . Sister Blanch Media Alive       History of syringamyelia  . Son Ilda Foil Alive    ROS:  Please see the history of present illness.  All other ROS reviewed and negative.     Physical Exam/Data:   Vitals:   01/22/20 0530 01/22/20 1407 01/22/20 2049 01/23/20 0604  BP: (!) 145/100 (!) 154/109 (!) 144/98 137/89  Pulse: (!) 107 (!) 106 (!) 105 93  Resp: 20 17 18 18   Temp: 97.8 F (36.6 C) 97.6 F (36.4 C) 97.7 F (36.5 C) 97.6 F (36.4 C)  TempSrc:  Oral Oral Oral  SpO2: 95% 96% 96% 95%  Weight:      Height:        Intake/Output Summary (Last 24 hours) at 01/23/2020 0852 Last data filed at 01/23/2020 0600 Gross per 24 hour  Intake 2688.78 ml  Output --  Net 2688.78 ml    Last 3 Weights 01/18/2020 01/18/2020 07/23/2019  Weight (lbs) 102 lb 1.6 oz 99 lb 3.3 oz 99 lb 6.4 oz  Weight (kg) 46.312 kg 45 kg 45.088 kg     Body mass index is 18.67 kg/m.   General:  Well nourished, well developed, female in no acute distress HEENT: normal Lymph: no adenopathy Neck: JVD -not elevated Endocrine:  No thryomegaly Vascular: No carotid bruits; 4/4 extremity pulses 2+  Cardiac:  normal S1, S2; irregular rate and rhythm; S1, S2 noted, no murmur Lungs: Generally clear bilaterally, no wheezing, few rhonchi, rales  Abd: soft, nontender, no hepatomegaly  Ext: no edema Musculoskeletal:  No deformities, BUE and BLE strength normal and equal Skin: warm and dry  Neuro:  CNs 2-12 intact, no focal  abnormalities noted Psych:  Normal affect   EKG:  The EKG was personally reviewed and demonstrates: 5/30 ECG is atrial fibrillation, heart rate 131 2019 ECG was sinus rhythm, heart rate 63 Telemetry:  Telemetry was personally reviewed and demonstrates: Atrial fibrillation, heart rate generally 90-110   CV studies:   ECHO: 01/19/2020 1. Patient in atrial fibrillation with RVR. Left ventricular ejection  fraction, by estimation, is 55 to 60%. The left ventricle has normal  function. The left ventricle has no regional wall motion abnormalities.  Left ventricular diastolic parameters are  indeterminate.  2. Right ventricular systolic function is normal. The right ventricular  size is normal. There is moderately elevated pulmonary artery systolic  pressure. The estimated right ventricular systolic pressure is 81.1 mmHg.  3. Left atrial size was moderately dilated.  4. Right atrial size was moderately dilated.  5. Mild restriction of the posterior MV leaflet. There is reverberation  artificat in the LA from clacified MV apparatus. . The mitral valve is  degenerative. Moderate mitral valve regurgitation. No evidence of mitral  stenosis.  6. Tricuspid valve regurgitation is moderate.  7. The aortic valve is tricuspid. Aortic valve regurgitation is not  visualized. Mild aortic valve sclerosis is present, with no evidence of  aortic valve stenosis.  8. The inferior vena cava is dilated in size with >50% respiratory  variability, suggesting right atrial pressure of 8 mmHg.    Laboratory Data:   Chemistry Recent Labs  Lab 01/21/20 0359 01/22/20 0323 01/23/20 0336  NA 134* 134* 134*  K 3.8 3.8 3.7  CL 100 103 106  CO2 23 21* 20*  GLUCOSE 100* 100* 103*  BUN 21 17 16   CREATININE 0.64 0.65 0.62  CALCIUM 7.8* 7.9* 7.9*  GFRNONAA >60 >60 >60  GFRAA >60 >60 >60  ANIONGAP 11 10 8     Lab Results  Component Value Date   ALT 10 01/18/2020   AST 14 (L) 01/18/2020   ALKPHOS  47 01/18/2020   BILITOT 1.0 01/18/2020   Hematology Recent Labs  Lab 01/21/20 0359 01/22/20 0323 01/23/20 0336  WBC 8.4 9.4 10.7*  RBC 3.71* 3.71* 3.78*  HGB 10.3* 10.3* 10.5*  HCT 32.9* 33.1* 34.0*  MCV 88.7 89.2 89.9  MCH 27.8 27.8 27.8  MCHC 31.3 31.1 30.9  RDW 14.0 14.1 14.2  PLT 314 307 300   Cardiac Enzymes High Sensitivity Troponin:  No results for input(s): TROPONINIHS in the last 720 hours.    BNPNo results for input(s): BNP, PROBNP in the last 168 hours.  DDimer No results for input(s): DDIMER in the last 168 hours. TSH:  Lab Results  Component Value Date   TSH 2.195 01/19/2020   Lipids: Lab Results  Component Value Date   CHOL 191 07/14/2019   HDL 65 07/14/2019   LDLCALC 110 (H) 07/14/2019   TRIG 71 07/14/2019   CHOLHDL 2.9 07/14/2019   HgbA1c:No results found for: HGBA1C Magnesium:  Magnesium  Date Value Ref Range Status  01/23/2020 1.7 1.7 - 2.4 mg/dL Final    Comment:    Performed at [facility], [place] [address], [place], [place] [address]     Radiology/Studies:  CT ABDOMEN PELVIS W CONTRAST  Result Date: 01/20/2020 CLINICAL DATA:  86 year old female with diarrhea. EXAM: CT ABDOMEN AND PELVIS WITH CONTRAST TECHNIQUE: Multidetector CT imaging of the abdomen and pelvis was performed using the standard protocol following bolus administration of intravenous contrast. CONTRAST:  12mL OMNIPAQUE IOHEXOL 300 MG/ML  SOLN COMPARISON:  Abdominal radiograph dated 01/19/2020 FINDINGS: Lower chest: Small bilateral pleural effusions with associated partial compressive atelectasis of the lower lobes. Pneumonia is not excluded. Clinical correlation is recommended. There is mild cardiomegaly. Multi vessel coronary vascular calcification. No intra-abdominal free air. Diffuse mesenteric edema and small perihepatic ascites. Hepatobiliary: Minimal irregularity of the liver contour may represent early changes of cirrhosis. Clinical correlation is  recommended. No intrahepatic biliary ductal dilatation. The gallbladder is unremarkable. Pancreas: Unremarkable. No pancreatic ductal dilatation or surrounding inflammatory changes. Spleen: Normal in size without focal abnormality. Adrenals/Urinary Tract: The adrenal glands are unremarkable. There is no hydronephrosis on either side. There is symmetric enhancement and excretion of contrast by both kidneys. There is a 2 cm left renal interpolar cyst and several subcentimeter hypodensities which are too small to characterize. The visualized ureters and urinary bladder appear unremarkable. Stomach/Bowel: There is scattered sigmoid diverticula. Diffuse thickening of the distal colon primarily involving the rectosigmoid most consistent with colitis. Clinical correlation is recommended. There is no bowel obstruction. The appendix is normal. Vascular/Lymphatic: Advanced aortoiliac atherosclerotic disease. There is a 3.5 cm partially thrombosed infrarenal abdominal aortic aneurysm. The IVC is unremarkable. No portal venous gas. There is no adenopathy. Reproductive: The uterus is grossly unremarkable. Other: Diffuse subcutaneous edema and anasarca. Musculoskeletal: Osteopenia with degenerative changes of the spine. No acute osseous pathology. 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 IMPRESSION: 1. Findings most likely represent colitis and less likely sigmoid diverticulitis. Clinical correlation is recommended. No bowel obstruction. Normal appendix. 2. Small bilateral pleural effusions, small ascites and anasarca. 3. 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 4. A 3.5 cm partially thrombosed infrarenal abdominal aortic aneurysm. 5. Aortic Atherosclerosis (ICD10-I70.0). Electronically Signed   By: Anner Crete M.D.   On: 01/20/2020 20:29   DG Abd Portable 1V  Result Date: 01/19/2020 CLINICAL DATA:  Diarrhea. EXAM: PORTABLE ABDOMEN - 1 VIEW COMPARISON:  None. FINDINGS: The bowel gas pattern is normal. No radio-opaque calculi or other  significant radiographic abnormality are seen. Extensive vascular calcification noted. Advanced lumbar spine degenerative changes and chronic avascular necrosis of left hip noted. IMPRESSION: Unremarkable bowel gas pattern. No acute findings. Electronically Signed   By: Marlaine Hind M.D.   On: 01/19/2020 12:12   ECHOCARDIOGRAM COMPLETE  Result Date: 01/22/2020    ECHOCARDIOGRAM REPORT   Patient Name:   Terri Acosta Date of Exam: 01/19/2020 Medical Rec #:  [id number]     Height:       62.0 in Accession #:    [id number]    Weight:       102.1 lb Date of Birth:  05/28/1923     BSA:          1.437 m Patient Age:    89 years      BP:           141/93 mmHg Patient Gender: F             HR:           117 bpm. Exam Location:  Inpatient Procedure: 2D Echo Indications:     atrial fibrillation 427.31  History:         Patient has no prior history of Echocardiogram examinations.                  Risk Factors:Hypertension and Dyslipidemia.  Sonographer:     Johny Chess RDCS Referring Phys:  [id number] [place] Diagnosing Phys: Adrian Prows MD IMPRESSIONS  1. Patient in atrial fibrillation with RVR. Left ventricular ejection fraction, by estimation, is 55 to 60%. The left ventricle has normal function. The left ventricle has no regional wall motion abnormalities. Left ventricular diastolic parameters are indeterminate.  2. Right ventricular systolic function is normal. The right ventricular size is normal. There is moderately elevated pulmonary artery systolic pressure. The estimated right ventricular systolic pressure is 86.7 mmHg.  3. Left atrial size was moderately dilated.  4. Right atrial size was moderately dilated.  5. Mild restriction of the posterior MV leaflet. There is reverberation artificat in the LA from clacified MV apparatus. . The mitral valve is degenerative. Moderate mitral valve regurgitation. No evidence of mitral stenosis.  6. Tricuspid valve regurgitation is moderate.  7. The aortic valve is  tricuspid. Aortic valve regurgitation is not visualized. Mild aortic valve sclerosis is present, with no evidence of aortic valve stenosis.  8. The inferior vena cava is dilated in size with >50% respiratory variability, suggesting right atrial pressure of 8 mmHg. FINDINGS  Left Ventricle: Patient in atrial fibrillation with RVR. Left ventricular ejection fraction, by estimation, is 55 to 60%. The left ventricle has normal function. The left ventricle has no regional wall motion abnormalities. The left ventricular internal  cavity size was normal in size. There is no left ventricular hypertrophy. Left ventricular diastolic parameters are indeterminate. Right Ventricle: The right ventricular size is normal. No increase in right ventricular wall thickness. Right ventricular systolic function is normal. There is moderately elevated pulmonary artery systolic pressure. The tricuspid regurgitant velocity is 3.08 m/s, and with an assumed right atrial pressure of 8 mmHg, the estimated right ventricular systolic pressure is 67.2 mmHg. Left Atrium: Left atrial size was moderately dilated. Right Atrium: Right atrial size was moderately dilated. Pericardium: There is no evidence of pericardial effusion. Mitral Valve: Mild restriction of the posterior MV  leaflet. There is reverberation artificat in the LA from clacified MV apparatus. The mitral valve is degenerative in appearance. There is mild thickening of the mitral valve leaflet(s). There is mild calcification of the mitral valve leaflet(s). Mild mitral annular calcification. Moderate mitral valve regurgitation. No evidence of mitral valve stenosis. Tricuspid Valve: The tricuspid valve is grossly normal. Tricuspid valve regurgitation is moderate. Aortic Valve: The aortic valve is tricuspid. Aortic valve regurgitation is not visualized. Mild aortic valve sclerosis is present, with no evidence of aortic valve stenosis. Pulmonic Valve: The pulmonic valve was grossly normal.  Pulmonic valve regurgitation is trivial. Aorta: The aortic root is normal in size and structure. Venous: The inferior vena cava is dilated in size with greater than 50% respiratory variability, suggesting right atrial pressure of 8 mmHg. IAS/Shunts: No atrial level shunt detected by color flow Doppler.  LEFT VENTRICLE PLAX 2D LVIDd:         4.00 cm LVIDs:         3.00 cm LV PW:         1.20 cm LV IVS:        1.10 cm LVOT diam:     2.00 cm LVOT Area:     3.14 cm  IVC IVC diam: 2.20 cm LEFT ATRIUM             Index       RIGHT ATRIUM           Index LA diam:        3.60 cm 2.51 cm/m  RA Area:     16.60 cm LA Vol (A2C):   67.0 ml 46.64 ml/m RA Volume:   39.20 ml  27.29 ml/m LA Vol (A4C):   56.0 ml 38.98 ml/m LA Biplane Vol: 66.9 ml 46.57 ml/m   AORTA Ao Root diam: 3.40 cm Ao Asc diam:  3.20 cm TRICUSPID VALVE TR Peak grad:   37.9 mmHg TR Vmax:        308.00 cm/s  SHUNTS Systemic Diam: 2.00 cm Adrian Prows MD Electronically signed by Adrian Prows MD Signature Date/Time: 01/22/2020/5:34:36 PM    Final     Assessment and Plan:   1.  Atrial fibrillation, RVR -Prior to admission, she was on Lopressor 50 mg twice daily.  -Because of low blood pressure, she was initially on 25 mg twice daily. -Lopressor has been uptitrated to 75 mg twice daily -Because of her acute illness, heart rate control is more difficult.  -However, I feel her blood pressure will tolerate another increase -Dr. Starla Link increased her metoprolol to 100 mg twice daily, but that was after she got her 75 mg a.m. dose.  We will give a one-time dose of 25 mg to get her up to 100 mg this morning. -Follow heart rate and blood pressure -If heart rate control is inadequate, consider adding Cardizem, starting with short acting 30 mg tablets and see if she responds well.  However, because of her acute illness heart rate control is difficult -According to the patient, she is in atrial fibrillation all the time and has been for years.  However, her ECG in  2019 was sinus rhythm. -Both atria are moderately dilated -At this time, rate control is best option and suspect rate control will continue to need to be the best option going forward -CHA2DS2-VASc is 88 (age x 2, female, HTN), but she has never been on blood thinners and does not ever remember having discussion with her PCP about them. -It could  be that her PCP considers the risk to outweigh the benefits because of age and fall risk, MD to review -She is currently on DVT Lovenox   Otherwise, per IM Principal Problem:   Diarrhea Active Problems:   Hyperlipidemia   Essential hypertension   Atrial fibrillation with rapid ventricular response (HCC)   Hypokalemia   Acute gastroenteritis     For questions or updates, please contact [facility] Please consult [email or web address] for contact info under Cardiology/STEMI.   SignedRosaria Ferries, PA-C  01/23/2020 8:52 AM  Patient seen and examined.  Agree with above documentation.  Terri Acosta is a 86 year old female with a history of atrial fibrillation not on anticoagulation, hypertension, asthma who we are consulted to see for atrial fibrillation at the request of Dr. Starla Link.  Patient reports that she has been told she has atrial fibrillation for years, but has been paroxysmal.  This has been managed by her primary care doctor, she does not have a cardiologist.  She is not on anticoagulation for unclear reasons.  She presented with diarrhea for the last 2 to 3 weeks.  She was found to be in atrial fibrillation with RVR.  She is on metoprolol 50 mg twice daily at home.  This was increased to metoprolol 100 mg twice daily.  TTE on 01/19/2020 showed EF 55 to 60%, normal RV function, RVSP 46 mmHg, moderate biatrial dilatation, moderate MR, moderate TR.  EKG shows atrial fibrillation, rate 130, low voltage, 72mm ST depressions leads V4-6.   On exam, patient is alert and oriented, normal rate, irregular rhythm, no murmurs, lungs CTAB, no LE edema.  For her  atrial fibrillation, rate appears better controlled since increasing metoprolol to 100 mg twice daily.  Will continue this dose.  Suspect her RVR is driven by her underlying illness and expect improvement in rates with recovery.  Her CHA2DS2-VASc score is 50 (age x2, female, hypertension).  Discussed risks/benefits of anticoagulation.  She has no bleeding history or any history of falls.  She is agreeable to starting anticoagulation.  Given her age/weight, will start Eliquis 2.5 mg BID.  Donato Heinz, MD

## 2020-01-23 NOTE — TOC Initial Note (Addendum)
Transition of Care [facility]) - Initial/Assessment Note    Patient Details  Name: Terri Acosta MRN: [id number] Date of Birth: 03-Dec-1922  Transition of Care [facility]) CM/SW Contact:    Terri Ludwig, LCSW Phone Number: 01/23/2020, 11:15 AM  Clinical Narrative:                  CSW spoke to patient in regards to going to SNF for short term rehab.  Patient is in agreement to going to rehab at [facility].  Patient lives at [place].  CSW contacted Terri Acosta in admissions, and she will review patient's information for admission.  CSW started insurance authorization yesterday, reference number is [id number].  CSW to continue to follow patient's progress throughout discharge planning.  2:30pm  CSW provided choice for patient and she chose [facility].  CSW contacted insurance company they requested to have CSW call back over the weekend to restart insurance authorization, if patient is medically ready for discharge.  CSW spoke to Terri Acosta at [facility] SNF and she said to contact Terri Acosta [phone] if insurance is approved, and to also call SNF nursing office at [phone]. [phone] so they can contact DON for approval for patient to come to SNF.  CSW to continue to follow patient's progress throughout discharge planning.   Expected Discharge Plan: [place] Barriers to Discharge: Continued Medical Work up   Patient Goals and CMS Choice Patient states their goals for this hospitalization and ongoing recovery are:: To go to [name], for short term rehab, then return back to her apartment. CMS Medicare.gov Compare Post Acute Care list provided to:: Patient Choice offered to / list presented to : Patient  Expected Discharge Plan and Services Expected Discharge Plan: [place] In-house Referral: Clinical Social Work   Post Acute Care Choice: [place] Living arrangements for the  past 2 months: [place]                                      Prior Living Arrangements/Services Living arrangements for the past 2 months: [place] Lives with:: Self Patient language and need for interpreter reviewed:: Yes Do you feel safe going back to the place where you live?: No   Patient feels that she would benefit from some short term rehab first before returning back home.  Need for Family Participation in Patient Care: No (Comment) Care giver support system in place?: No (comment)   Criminal Activity/Legal Involvement Pertinent to Current Situation/Hospitalization: No - Comment as needed  Activities of Daily Living Home Assistive Devices/Equipment: Bedside commode/3-in-1, Walker (specify type)(4-wheel walker) ADL Screening (condition at time of admission) Patient's cognitive ability adequate to safely complete daily activities?: Yes Is the patient deaf or have difficulty hearing?: Yes Does the patient have difficulty seeing, even when wearing glasses/contacts?: Yes Does the patient have difficulty concentrating, remembering, or making decisions?: No Patient able to express need for assistance with ADLs?: Yes Does the patient have difficulty dressing or bathing?: No Independently performs ADLs?: Yes (appropriate for developmental age) Does the patient have difficulty walking or climbing stairs?: Yes Weakness of Legs: None Weakness of Arms/Hands: None  Permission Sought/Granted Permission sought to share information with : Facility Sport and exercise psychologist, Family Supports Permission granted to share information with : Yes, Verbal Permission Granted  Share Information with NAME:  Terri Acosta Daughter [phone]  [phone]  Permission granted to share info w AGENCY: [place] [facility].  Permission granted to share info w Relationship: [facility] admissions     Emotional Assessment Appearance:: Appears stated  age Attitude/Demeanor/Rapport: Engaged Affect (typically observed): Accepting, Appropriate, Calm, Stable Orientation: : Oriented to Self, Oriented to Place, Oriented to  Time, Oriented to Situation Alcohol / Substance Use: Not Applicable Psych Involvement: No (comment)  Admission diagnosis:  Diarrhea [R19.7] Dehydration [E86.0] Hypokalemia [E87.6] Atrial fibrillation with rapid ventricular response (HCC) [I48.91] Diarrhea, unspecified type [R19.7] Acute gastroenteritis [K52.9] Patient Active Problem List   Diagnosis Date Noted  . Acute gastroenteritis 01/19/2020  . Diarrhea 01/18/2020  . Atrial fibrillation with rapid ventricular response ([place]) 01/18/2020  . Hypokalemia 01/18/2020  . Dehydration   . Vaginal atrophy 01/15/2019  . Cyst of right kidney 07/27/2017  . Knee pain, chronic 12/26/2016  . Unstable gait 07/04/2016  . COPD (chronic obstructive pulmonary disease) ([place]) 08/04/2014  . Palpitations 02/13/2012  . Hyperglycemia 03/14/2011  . Hyperlipidemia 10/18/2010  . Disorder of bone and cartilage, unspecified 02/18/2007  . Essential hypertension 08/20/2006  . Osteoarthritis 08/20/2006   PCP:  [name], [name], NP Pharmacy:   [place], [place] [address] [address] Phone: [phone] Fax: [place] [address], [place] [address] [place] [address] Phone: [phone] Fax: [phone]  [place], [place] [facility] [address] [place] [address] [place] [address] Phone: [phone] Fax: [phone]     Social Determinants of Health ([place]) Interventions    Readmission Risk Interventions No flowsheet data found.

## 2020-01-23 NOTE — Care Management Important Message (Signed)
Important Message  Patient Details  Name: Terri Acosta MRN: [id number] Date of Birth: 01/26/1923   Medicare Important Message Given:     Document has been given to Evette Cristal  to give to the patient.   Crista Luria 01/23/2020, 10:52 AM

## 2020-01-24 DIAGNOSIS — I340 Nonrheumatic mitral (valve) insufficiency: Secondary | ICD-10-CM

## 2020-01-24 LAB — CBC WITH DIFFERENTIAL/PLATELET
Abs Immature Granulocytes: 0.18 10*3/uL — ABNORMAL HIGH (ref 0.00–0.07)
Basophils Absolute: 0 10*3/uL (ref 0.0–0.1)
Basophils Relative: 0 %
Eosinophils Absolute: 0.1 10*3/uL (ref 0.0–0.5)
Eosinophils Relative: 1 %
HCT: 35.7 % — ABNORMAL LOW (ref 36.0–46.0)
Hemoglobin: 11.1 g/dL — ABNORMAL LOW (ref 12.0–15.0)
Immature Granulocytes: 2 %
Lymphocytes Relative: 11 %
Lymphs Abs: 1.3 10*3/uL (ref 0.7–4.0)
MCH: 27.4 pg (ref 26.0–34.0)
MCHC: 31.1 g/dL (ref 30.0–36.0)
MCV: 88.1 fL (ref 80.0–100.0)
Monocytes Absolute: 0.6 10*3/uL (ref 0.1–1.0)
Monocytes Relative: 5 %
Neutro Abs: 9.6 10*3/uL — ABNORMAL HIGH (ref 1.7–7.7)
Neutrophils Relative %: 81 %
Platelets: 332 10*3/uL (ref 150–400)
RBC: 4.05 MIL/uL (ref 3.87–5.11)
RDW: 14.4 % (ref 11.5–15.5)
WBC: 11.8 10*3/uL — ABNORMAL HIGH (ref 4.0–10.5)
nRBC: 0 % (ref 0.0–0.2)

## 2020-01-24 LAB — BASIC METABOLIC PANEL
Anion gap: 8 (ref 5–15)
BUN: 17 mg/dL (ref 8–23)
CO2: 22 mmol/L (ref 22–32)
Calcium: 8.2 mg/dL — ABNORMAL LOW (ref 8.9–10.3)
Chloride: 103 mmol/L (ref 98–111)
Creatinine, Ser: 0.7 mg/dL (ref 0.44–1.00)
GFR calc Af Amer: >60 mL/min (ref >60)
GFR calc non Af Amer: >60 mL/min (ref >60)
Glucose, Bld: 123 mg/dL — ABNORMAL HIGH (ref 70–99)
Potassium: 3.7 mmol/L (ref 3.5–5.1)
Sodium: 133 mmol/L — ABNORMAL LOW (ref 135–145)

## 2020-01-24 LAB — MAGNESIUM: Magnesium: 1.6 mg/dL — ABNORMAL LOW (ref 1.7–2.4)

## 2020-01-24 MED ORDER — DILTIAZEM HCL 30 MG PO TABS
30.0000 mg | ORAL_TABLET | Freq: Four times a day (QID) | ORAL | Status: DC
  Administered 2020-01-24 – 2020-01-25 (×4): 30 mg via ORAL
  Filled 2020-01-24 (×4): qty 1

## 2020-01-24 MED ORDER — MAGNESIUM SULFATE 2 GM/50ML IV SOLN
2.0000 g | Freq: Once | INTRAVENOUS | Status: AC
  Administered 2020-01-24: 2 g via INTRAVENOUS
  Filled 2020-01-24: qty 50

## 2020-01-24 NOTE — Progress Notes (Addendum)
CSW received a handoff to call [facility] [phone] to reinitiate auth per the ED CSW's instructions from 01/25/20 if pt is medically ready for D/C.  CSW messaged provider and RN to determine pt's D/C status.  3:08 PM Per provider pt will be ready to D/C in the AM and is so Bernadene Bell will have to be called at [phone] to reinitiate auth per the ED CSW's instructions from 01/24/20.  CSW will continue to follow for D/C needs.  Alphonse Guild. Baron Parmelee  MSW, LCSW, LCAS, CCS Transitions of Care Clinical Social Worker Care Coordination Department Ph: [phone]

## 2020-01-24 NOTE — Progress Notes (Signed)
Hemoglobin stable.Patient ID: Terri Acosta, female   DOB: 10/01/1922, 86 y.o.   MRN: [id number]  PROGRESS NOTE    Terri Acosta  DTO:[id number] DOB: Jul 24, 1923 DOA: 01/18/2020 PCP: [name], [name], NP   Brief Narrative:  86 y.o.femalewho has a history of paroxysmal atrial fibrillation not on chronic anticoagulation for unknown reasons (question if due to age), hypertension presented with worsening diarrhea for the last 2 to 3 weeks.  In the ED, she was found to be tachycardic, hypokalemic.  Stool studies was negative for C. difficile.  She was admitted for further evaluation and management.  Assessment & Plan:   Probable colitis with possibility of sigmoid diverticulitis causing diarrhea -Presented with worsening diarrhea for the last 2 to 3 weeks.  Stool studies was negative for C. Difficile. -CT of the abdomen done on 01/20/2020 showed possible colitis versus less likely sigmoid diverticulitis along with early cirrhosis.  Cirrhosis can be followed up as an outpatient -Started on Rocephin and Flagyl for colitis on 01/21/2020.  Use Imodium as needed. -Diarrhea is improving but still requiring intermittent Imodium. -Off IV fluids.  Paroxysmal A. fib with RVR -Still intermittently tachycardic.  Continue metoprolol.  Cardiology following and has added oral Cardizem this morning.  Patient also has been started on low-dose oral Eliquis by cardiology from 01/23/2020.    Hypertension -Monitor blood pressure.  Metoprolol and Cardizem plan as above; Hyzaar held for now.  Normocytic anemia -Hemoglobin stable.  Monitor intermittently.  Generalized conditioning -PT/OT recommend SNF placement.  Social worker consulted.  DVT prophylaxis: Lovenox Code Status: DNR Family Communication: Spoke to patient at bedside.  Spoke to daughter/Karen on 01/23/2020 on phone. Disposition Plan: Status is: Inpatient  Remains inpatient appropriate because:IV treatments appropriate due to intensity of illness or inability to  take PO.  Diarrhea improving; oral intake still poor.  Still intermittently tachycardic and cardiology is adjusting medications.  Dispo: The patient is from: Independent living facility              Anticipated d/c is to: SNF              Anticipated d/c date is: 1-2 days              Patient currently is not medically stable to d/c.   Consultants: None  Procedures: Echo   1. Patient in atrial fibrillation with RVR. Left ventricular ejection  fraction, by estimation, is 55 to 60%. The left ventricle has normal  function. The left ventricle has no regional wall motion abnormalities.  Left ventricular diastolic parameters are  indeterminate.  2. Right ventricular systolic function is normal. The right ventricular  size is normal. There is moderately elevated pulmonary artery systolic  pressure. The estimated right ventricular systolic pressure is 98.3 mmHg.  3. Left atrial size was moderately dilated.  4. Right atrial size was moderately dilated.  5. Mild restriction of the posterior MV leaflet. There is reverberation  artificat in the LA from clacified MV apparatus. . The mitral valve is  degenerative. Moderate mitral valve regurgitation. No evidence of mitral  stenosis.  6. Tricuspid valve regurgitation is moderate.  7. The aortic valve is tricuspid. Aortic valve regurgitation is not  visualized. Mild aortic valve sclerosis is present, with no evidence of  aortic valve stenosis.  8. The inferior vena cava is dilated in size with >50% respiratory  variability, suggesting right atrial pressure of 8 mmHg.   Antimicrobials: Rocephin and Flagyl from 01/21/2020 onwards   Subjective: Patient seen  and examined at bedside.  Feels slightly better than yesterday but still having intermittent diarrhea requiring Imodium.  No chest pain, worsening shortness of breath or fever reported.  Objective: Vitals:   01/23/20 2042 01/23/20 2104 01/24/20 0514 01/24/20 0825  BP: (!) 136/97   130/90   Pulse: (!) 52 (!) 106 92   Resp: 20  20   Temp: 97.9 F (36.6 C)  (!) 97.5 F (36.4 C)   TempSrc:   Oral   SpO2: 96%  93% 94%  Weight:      Height:        Intake/Output Summary (Last 24 hours) at 01/24/2020 1031 Last data filed at 01/24/2020 0726 Gross per 24 hour  Intake 100 ml  Output 200 ml  Net -100 ml   Filed Weights   01/18/20 1114 01/18/20 1710  Weight: 45 kg 46.3 kg    Examination:  General exam: No acute distress.  Elderly female lying in bed. Respiratory system: Bilateral decreased breath sounds at bases with scattered crackles.   Cardiovascular system: S1-S2 heard, tachycardic  gastrointestinal system: Abdomen is nondistended, soft and nontender.  Bowel sounds are heard  extremities: Trace lower extremity edema present no cyanosis    Data Reviewed: I have personally reviewed following labs and imaging studies  CBC: Recent Labs  Lab 01/20/20 0311 01/21/20 0359 01/22/20 0323 01/23/20 0336 01/24/20 0414  WBC 8.5 8.4 9.4 10.7* 11.8*  NEUTROABS 6.7 6.5 7.3 8.6* 9.6*  HGB 9.9* 10.3* 10.3* 10.5* 11.1*  HCT 32.0* 32.9* 33.1* 34.0* 35.7*  MCV 89.4 88.7 89.2 89.9 88.1  PLT 290 314 307 300 161   Basic Metabolic Panel: Recent Labs  Lab 01/20/20 0311 01/21/20 0359 01/22/20 0323 01/23/20 0336 01/24/20 0414  NA 138 134* 134* 134* 133*  K 4.1 3.8 3.8 3.7 3.7  CL 104 100 103 106 103  CO2 26 23 21* 20* 22  GLUCOSE 110* 100* 100* 103* 123*  BUN 26* 21 17 16 17   CREATININE 0.75 0.64 0.65 0.62 0.70  CALCIUM 7.8* 7.8* 7.9* 7.9* 8.2*  MG 2.0 1.8 1.8 1.7 1.6*   GFR: Estimated Creatinine Clearance: 31.4 mL/min (by C-G formula based on SCr of 0.7 mg/dL). Liver Function Tests: Recent Labs  Lab 01/18/20 1302  AST 14*  ALT 10  ALKPHOS 47  BILITOT 1.0  PROT 5.8*  ALBUMIN 3.0*   No results for input(s): LIPASE, AMYLASE in the last 168 hours. No results for input(s): AMMONIA in the last 168 hours. Coagulation Profile: Recent Labs  Lab  01/18/20 1302  INR 1.1   Cardiac Enzymes: No results for input(s): CKTOTAL, CKMB, CKMBINDEX, TROPONINI in the last 168 hours. BNP (last 3 results) No results for input(s): PROBNP in the last 8760 hours. HbA1C: No results for input(s): HGBA1C in the last 72 hours. CBG: No results for input(s): GLUCAP in the last 168 hours. Lipid Profile: No results for input(s): CHOL, HDL, LDLCALC, TRIG, CHOLHDL, LDLDIRECT in the last 72 hours. Thyroid Function Tests: No results for input(s): TSH, T4TOTAL, FREET4, T3FREE, THYROIDAB in the last 72 hours. Anemia Panel: No results for input(s): VITAMINB12, FOLATE, FERRITIN, TIBC, IRON, RETICCTPCT in the last 72 hours. Sepsis Labs: No results for input(s): PROCALCITON, LATICACIDVEN in the last 168 hours.  Recent Results (from the past 240 hour(s))  SARS Coronavirus 2 by RT PCR (hospital order, performed in [facility] hospital lab) Nasopharyngeal Nasopharyngeal Swab     Status: None   Collection Time: 01/18/20  2:56 PM   Specimen: Nasopharyngeal  Swab  Result Value Ref Range Status   SARS Coronavirus 2 NEGATIVE NEGATIVE Final    Comment: (NOTE) SARS-CoV-2 target nucleic acids are NOT DETECTED. The SARS-CoV-2 RNA is generally detectable in upper and lower respiratory specimens during the acute phase of infection. The lowest concentration of SARS-CoV-2 viral copies this assay can detect is 250 copies / mL. A negative result does not preclude SARS-CoV-2 infection and should not be used as the sole basis for treatment or other patient management decisions.  A negative result may occur with improper specimen collection / handling, submission of specimen other than nasopharyngeal swab, presence of viral mutation(s) within the areas targeted by this assay, and inadequate number of viral copies (<250 copies / mL). A negative result must be combined with clinical observations, patient history, and epidemiological information. Fact Sheet for Patients:    StrictlyIdeas.no Fact Sheet for Healthcare Providers: BankingDealers.co.za This test is not yet approved or cleared  by the [place] FDA and has been authorized for detection and/or diagnosis of SARS-CoV-2 by FDA under an Emergency Use Authorization (EUA).  This EUA will remain in effect (meaning this test can be used) for the duration of the COVID-19 declaration under Section 564(b)(1) of the Act, 21 U.S.C. section 360bbb-3(b)(1), unless the authorization is terminated or revoked sooner. Performed at [facility], [place] [address], [place], [place] [address]   Gastrointestinal Panel by PCR , Stool     Status: None   Collection Time: 01/19/20  3:00 AM   Specimen: Stool  Result Value Ref Range Status   Campylobacter species NOT DETECTED NOT DETECTED Final   Plesimonas shigelloides NOT DETECTED NOT DETECTED Final   Salmonella species NOT DETECTED NOT DETECTED Final   Yersinia enterocolitica NOT DETECTED NOT DETECTED Final   Vibrio species NOT DETECTED NOT DETECTED Final   Vibrio cholerae NOT DETECTED NOT DETECTED Final   Enteroaggregative E coli (EAEC) NOT DETECTED NOT DETECTED Final   Enteropathogenic E coli (EPEC) NOT DETECTED NOT DETECTED Final   Enterotoxigenic E coli (ETEC) NOT DETECTED NOT DETECTED Final   Shiga like toxin producing E coli (STEC) NOT DETECTED NOT DETECTED Final   Shigella/Enteroinvasive E coli (EIEC) NOT DETECTED NOT DETECTED Final   Cryptosporidium NOT DETECTED NOT DETECTED Final   Cyclospora cayetanensis NOT DETECTED NOT DETECTED Final   Entamoeba histolytica NOT DETECTED NOT DETECTED Final   Giardia lamblia NOT DETECTED NOT DETECTED Final   Adenovirus F40/41 NOT DETECTED NOT DETECTED Final   Astrovirus NOT DETECTED NOT DETECTED Final   Norovirus GI/GII NOT DETECTED NOT DETECTED Final   Rotavirus A NOT DETECTED NOT DETECTED Final   Sapovirus (I, II, IV, and V) NOT DETECTED NOT DETECTED  Final    Comment: Performed at [facility], [place] [place], [place] [address]  C Difficile Quick Screen w PCR reflex     Status: None   Collection Time: 01/19/20  3:00 AM   Specimen: STOOL  Result Value Ref Range Status   C Diff antigen NEGATIVE NEGATIVE Final   C Diff toxin NEGATIVE NEGATIVE Final   C Diff interpretation No C. difficile detected.  Final    Comment: Performed at [facility], [place] [address], [place],  [address]  Urine Culture     Status: Abnormal   Collection Time: 01/19/20 11:30 AM   Specimen: Urine, Random  Result Value Ref Range Status   Specimen Description   Final    URINE, RANDOM Performed at [facility], [place] [address], [place], [place] [address]    Special Requests   Final    NONE Performed at [facility], [place] [address] [place], [place] [address]    Culture (A)  Final    40,000 COLONIES/mL MULTIPLE SPECIES PRESENT, SUGGEST RECOLLECTION   Report Status 01/20/2020 FINAL  Final         Radiology Studies: No results found.      Scheduled Meds:  apixaban  2.5 mg Oral BID   aspirin EC  81 mg Oral Daily   diltiazem  30 mg Oral Q6H   metoprolol tartrate  100 mg Oral BID   Continuous Infusions:  cefTRIAXone (ROCEPHIN)  IV 2 g (01/23/20 1244)   metronidazole 500 mg (01/24/20 0540)          Aline August, MD [facility] 01/24/2020, 10:31 AM

## 2020-01-24 NOTE — Plan of Care (Signed)
Patient with no stools on 7 a to 7 p shift, continues with poor appetite, eating less than 50% of all meals, refuses nutritional supplements as well (Ensure).  Updated daughter of mother's condition over the phone.  Required no prn medication for rate control this shift.

## 2020-01-24 NOTE — Progress Notes (Addendum)
Progress Note  Patient Name: Terri Acosta Date of Encounter: 01/24/2020  Primary Cardiologist: No primary care provider on file.   Subjective   Still complains of some nausea and abdominal pain.  Tele shows atrial fibrillation with mildly elevated HR in the low 100's at rest  Inpatient Medications    Scheduled Meds: . apixaban  2.5 mg Oral BID  . aspirin EC  81 mg Oral Daily  . metoprolol tartrate  100 mg Oral BID   Continuous Infusions: . cefTRIAXone (ROCEPHIN)  IV 2 g (01/23/20 1244)  . magnesium sulfate bolus IVPB    . metronidazole 500 mg (01/24/20 0540)   PRN Meds: acetaminophen **OR** acetaminophen, guaiFENesin-dextromethorphan, ipratropium-albuterol, loperamide, metoprolol tartrate, ondansetron **OR** ondansetron (ZOFRAN) IV   Vital Signs    Vitals:   01/23/20 2042 01/23/20 2104 01/24/20 0514 01/24/20 0825  BP: (!) 136/97  130/90   Pulse: (!) 52 (!) 106 92   Resp: 20  20   Temp: 97.9 F (36.6 C)  (!) 97.5 F (36.4 C)   TempSrc:   Oral   SpO2: 96%  93% 94%  Weight:      Height:        Intake/Output Summary (Last 24 hours) at 01/24/2020 0827 Last data filed at 01/24/2020 0726 Gross per 24 hour  Intake 100 ml  Output 200 ml  Net -100 ml   Filed Weights   01/18/20 1114 01/18/20 1710  Weight: 45 kg 46.3 kg    Telemetry    Atrial fibrillation with RVR at 102bpm - Personally Reviewed  ECG    No new EKG to review - Personally Reviewed  Physical Exam   GEN: No acute distress.   Neck: No JVD Cardiac: irregularly irregular, no murmurs, rubs, or gallops.  Respiratory: Clear to auscultation bilaterally. GI: Soft, nontender, non-distended  MS: No edema; No deformity. Neuro:  Nonfocal  Psych: Normal affect   Labs    Chemistry Recent Labs  Lab 01/18/20 1302 01/19/20 0549 01/22/20 0323 01/23/20 0336 01/24/20 0414  NA 133*   < > 134* 134* 133*  K 2.6*   < > 3.8 3.7 3.7  CL 90*   < > 103 106 103  CO2 31   < > 21* 20* 22  GLUCOSE 121*   < >  100* 103* 123*  BUN 37*   < > 17 16 17   CREATININE 0.87   < > 0.65 0.62 0.70  CALCIUM 8.1*   < > 7.9* 7.9* 8.2*  PROT 5.8*  --   --   --   --   ALBUMIN 3.0*  --   --   --   --   AST 14*  --   --   --   --   ALT 10  --   --   --   --   ALKPHOS 47  --   --   --   --   BILITOT 1.0  --   --   --   --   GFRNONAA 57*   < > >60 >60 >60  GFRAA >60   < > >60 >60 >60  ANIONGAP 12   < > 10 8 8    < > = values in this interval not displayed.     Hematology Recent Labs  Lab 01/22/20 0323 01/23/20 0336 01/24/20 0414  WBC 9.4 10.7* 11.8*  RBC 3.71* 3.78* 4.05  HGB 10.3* 10.5* 11.1*  HCT 33.1* 34.0* 35.7*  MCV 89.2 89.9 88.1  MCH 27.8 27.8 27.4  MCHC 31.1 30.9 31.1  RDW 14.1 14.2 14.4  PLT 307 300 332    Cardiac EnzymesNo results for input(s): TROPONINI in the last 168 hours. No results for input(s): TROPIPOC in the last 168 hours.   BNPNo results for input(s): BNP, PROBNP in the last 168 hours.   DDimer No results for input(s): DDIMER in the last 168 hours.   Radiology    No results found.  Cardiac Studies   2D echo 01/19/2020 IMPRESSIONS    1. Patient in atrial fibrillation with RVR. Left ventricular ejection  fraction, by estimation, is 55 to 60%. The left ventricle has normal  function. The left ventricle has no regional wall motion abnormalities.  Left ventricular diastolic parameters are  indeterminate.  2. Right ventricular systolic function is normal. The right ventricular  size is normal. There is moderately elevated pulmonary artery systolic  pressure. The estimated right ventricular systolic pressure is 37.9 mmHg.  3. Left atrial size was moderately dilated.  4. Right atrial size was moderately dilated.  5. Mild restriction of the posterior MV leaflet. There is reverberation  artificat in the LA from clacified MV apparatus. . The mitral valve is  degenerative. Moderate mitral valve regurgitation. No evidence of mitral  stenosis.  6. Tricuspid valve  regurgitation is moderate.  7. The aortic valve is tricuspid. Aortic valve regurgitation is not  visualized. Mild aortic valve sclerosis is present, with no evidence of  aortic valve stenosis.  8. The inferior vena cava is dilated in size with >50% respiratory  variability, suggesting right atrial pressure of 8 mmHg.   Patient Profile     86 y.o. female  with a hx of PAF not on anticoag, PACs, HTN, OA, asthma, was admitted 05/30 with diarrhea, weakness, low K+, rapid Afib. HR has remained elevated, cards asked to see by Dr Starla Link.  Assessment & Plan    1.  Chronic atrial fibrillation with RVR -HR remains mildly elevated in the low 100's -Prior to admission, she was on Lopressor 50 mg twice daily.  -Because of low blood pressure, she was initially on 25 mg twice daily. -Lopressor has been uptitrated to 75 mg twice daily and then 100mg  BID yesterday --HR still not adequately controlled so will add Cardizem 30mg  q6 hours and if she tolerates then can change to long acting -According to the patient, she is in atrial fibrillation all the time and has been for years.  However, her ECG in 2019 was sinus rhythm. -Both atria are moderately dilated on echo -At this time, rate control is best option and suspect rate control will continue to need to be the best option going forward -CHA2DS2-VASc is 38 (age x 2, female, HTN), but she has never been on blood thinners and does not ever remember having discussion with her PCP about them. -It could be that her PCP considers the risk to outweigh the benefits because of age and fall risk, MD to review --she has been started on Eliquis 2.5mg  BID   2.  Mitral regurgitation -moderate by echo -LA moderate dilated -continue to follow  3.  HTN -continue Lopressor 100mg  BID -DBP mildly elevated -adding Cardizem 30mg  q6 hours for better HR control which will help with BP      For questions or updates, please contact [place] Please consult  [email or web address] for contact info under Cardiology/STEMI.      Signed, Fransico Him, MD  01/24/2020, 8:27 AM

## 2020-01-24 NOTE — Progress Notes (Signed)
Per provider pt will likeley be ready to D/C in the AM and is so Bernadene Bell will have to be called at [phone] to reinitiate auth per the ED CSW's instructions from 01/24/20.  CSW will continue to follow for D/C needs.  Alphonse Guild. Laquanda Bick  MSW, LCSW, LCAS, CCS Transitions of Care Clinical Social Worker Care Coordination Department Ph: [phone]

## 2020-01-25 LAB — CBC WITH DIFFERENTIAL/PLATELET
Abs Immature Granulocytes: 0.14 10*3/uL — ABNORMAL HIGH (ref 0.00–0.07)
Basophils Absolute: 0 10*3/uL (ref 0.0–0.1)
Basophils Relative: 0 %
Eosinophils Absolute: 0.1 10*3/uL (ref 0.0–0.5)
Eosinophils Relative: 1 %
HCT: 31.4 % — ABNORMAL LOW (ref 36.0–46.0)
Hemoglobin: 9.9 g/dL — ABNORMAL LOW (ref 12.0–15.0)
Immature Granulocytes: 1 %
Lymphocytes Relative: 9 %
Lymphs Abs: 1.2 10*3/uL (ref 0.7–4.0)
MCH: 27.7 pg (ref 26.0–34.0)
MCHC: 31.5 g/dL (ref 30.0–36.0)
MCV: 87.7 fL (ref 80.0–100.0)
Monocytes Absolute: 0.6 10*3/uL (ref 0.1–1.0)
Monocytes Relative: 5 %
Neutro Abs: 10.6 10*3/uL — ABNORMAL HIGH (ref 1.7–7.7)
Neutrophils Relative %: 84 %
Platelets: 279 10*3/uL (ref 150–400)
RBC: 3.58 MIL/uL — ABNORMAL LOW (ref 3.87–5.11)
RDW: 14.4 % (ref 11.5–15.5)
WBC: 12.6 10*3/uL — ABNORMAL HIGH (ref 4.0–10.5)
nRBC: 0 % (ref 0.0–0.2)

## 2020-01-25 LAB — BASIC METABOLIC PANEL
Anion gap: 9 (ref 5–15)
BUN: 15 mg/dL (ref 8–23)
CO2: 21 mmol/L — ABNORMAL LOW (ref 22–32)
Calcium: 8 mg/dL — ABNORMAL LOW (ref 8.9–10.3)
Chloride: 103 mmol/L (ref 98–111)
Creatinine, Ser: 0.61 mg/dL (ref 0.44–1.00)
GFR calc Af Amer: >60 mL/min (ref >60)
GFR calc non Af Amer: >60 mL/min (ref >60)
Glucose, Bld: 118 mg/dL — ABNORMAL HIGH (ref 70–99)
Potassium: 3.6 mmol/L (ref 3.5–5.1)
Sodium: 133 mmol/L — ABNORMAL LOW (ref 135–145)

## 2020-01-25 LAB — MAGNESIUM: Magnesium: 2.1 mg/dL (ref 1.7–2.4)

## 2020-01-25 MED ORDER — DILTIAZEM HCL ER COATED BEADS 120 MG PO CP24
120.0000 mg | ORAL_CAPSULE | Freq: Every day | ORAL | Status: DC
  Administered 2020-01-25 – 2020-01-26 (×2): 120 mg via ORAL
  Filled 2020-01-25 (×2): qty 1

## 2020-01-25 MED ORDER — METRONIDAZOLE 500 MG PO TABS
500.0000 mg | ORAL_TABLET | Freq: Three times a day (TID) | ORAL | 0 refills | Status: AC
Start: 2020-01-25 — End: 2020-02-01

## 2020-01-25 MED ORDER — LOPERAMIDE HCL 2 MG PO CAPS: 2.0000 mg | ORAL_CAPSULE | Freq: Four times a day (QID) | ORAL | 0 refills | Status: AC | PRN

## 2020-01-25 MED ORDER — METOPROLOL TARTRATE 50 MG PO TABS: 100.0000 mg | ORAL_TABLET | Freq: Two times a day (BID) | ORAL | Status: DC

## 2020-01-25 MED ORDER — APIXABAN 2.5 MG PO TABS: 2.5000 mg | ORAL_TABLET | Freq: Two times a day (BID) | ORAL | 0 refills | Status: DC

## 2020-01-25 MED ORDER — POTASSIUM CHLORIDE CRYS ER 20 MEQ PO TBCR: 20.0000 meq | EXTENDED_RELEASE_TABLET | Freq: Every day | ORAL | Status: DC

## 2020-01-25 MED ORDER — CEFPODOXIME PROXETIL 200 MG PO TABS: 200.0000 mg | ORAL_TABLET | Freq: Two times a day (BID) | ORAL | 0 refills | Status: AC

## 2020-01-25 MED ORDER — DILTIAZEM HCL ER COATED BEADS 120 MG PO CP24: 120.0000 mg | ORAL_CAPSULE | Freq: Every day | ORAL | 0 refills | Status: AC

## 2020-01-25 NOTE — Progress Notes (Addendum)
Progress Note  Patient Name: Terri Acosta Date of Encounter: 01/25/2020  Primary Cardiologist: No primary care provider on file.   Subjective   Denies any chest pain or SOB.  Remains in afib with HR in the 80's  Inpatient Medications    Scheduled Meds: . apixaban  2.5 mg Oral BID  . aspirin EC  81 mg Oral Daily  . diltiazem  30 mg Oral Q6H  . metoprolol tartrate  100 mg Oral BID   Continuous Infusions: . cefTRIAXone (ROCEPHIN)  IV 2 g (01/24/20 1228)  . metronidazole 500 mg (01/25/20 0554)   PRN Meds: acetaminophen **OR** acetaminophen, guaiFENesin-dextromethorphan, ipratropium-albuterol, loperamide, metoprolol tartrate, ondansetron **OR** ondansetron (ZOFRAN) IV   Vital Signs    Vitals:   01/24/20 1249 01/24/20 2045 01/25/20 0035 01/25/20 0549  BP: 120/84 122/86 138/76 133/75  Pulse: 73 76 71 71  Resp: 17     Temp: 97.6 F (36.4 C) 98 F (36.7 C)  97.7 F (36.5 C)  TempSrc: Oral Oral  Oral  SpO2: 94% 93%    Weight:      Height:        Intake/Output Summary (Last 24 hours) at 01/25/2020 0739 Last data filed at 01/25/2020 0103 Gross per 24 hour  Intake 100 ml  Output 300 ml  Net -200 ml   Filed Weights   01/18/20 1114 01/18/20 1710  Weight: 45 kg 46.3 kg    Telemetry    afib with HR in the 80's - Personally Reviewed  ECG    No new EKG to review - Personally Reviewed  Physical Exam   GEN: Well nourished, well developed in no acute distress HEENT: Normal NECK: No JVD; No carotid bruits LYMPHATICS: No lymphadenopathy CARDIAC:irregularly irregular, no murmurs, rubs, gallops RESPIRATORY:  Clear to auscultation without rales, wheezing or rhonchi  ABDOMEN: Soft, non-tender, non-distended MUSCULOSKELETAL:  No edema; No deformity  SKIN: Warm and dry NEUROLOGIC:  Alert and oriented x 3 PSYCHIATRIC:  Normal affect    Labs    Chemistry Recent Labs  Lab 01/18/20 1302 01/19/20 0549 01/23/20 0336 01/24/20 0414 01/25/20 0428  NA 133*   < > 134*  133* 133*  K 2.6*   < > 3.7 3.7 3.6  CL 90*   < > 106 103 103  CO2 31   < > 20* 22 21*  GLUCOSE 121*   < > 103* 123* 118*  BUN 37*   < > 16 17 15   CREATININE 0.87   < > 0.62 0.70 0.61  CALCIUM 8.1*   < > 7.9* 8.2* 8.0*  PROT 5.8*  --   --   --   --   ALBUMIN 3.0*  --   --   --   --   AST 14*  --   --   --   --   ALT 10  --   --   --   --   ALKPHOS 47  --   --   --   --   BILITOT 1.0  --   --   --   --   GFRNONAA 57*   < > >60 >60 >60  GFRAA >60   < > >60 >60 >60  ANIONGAP 12   < > 8 8 9    < > = values in this interval not displayed.     Hematology Recent Labs  Lab 01/23/20 0336 01/24/20 0414 01/25/20 0428  WBC 10.7* 11.8* 12.6*  RBC 3.78* 4.05 3.58*  HGB 10.5* 11.1* 9.9*  HCT 34.0* 35.7* 31.4*  MCV 89.9 88.1 87.7  MCH 27.8 27.4 27.7  MCHC 30.9 31.1 31.5  RDW 14.2 14.4 14.4  PLT 300 332 279    Cardiac EnzymesNo results for input(s): TROPONINI in the last 168 hours. No results for input(s): TROPIPOC in the last 168 hours.   BNPNo results for input(s): BNP, PROBNP in the last 168 hours.   DDimer No results for input(s): DDIMER in the last 168 hours.   Radiology    No results found.  Cardiac Studies   2D echo 01/19/2020 IMPRESSIONS    1. Patient in atrial fibrillation with RVR. Left ventricular ejection  fraction, by estimation, is 55 to 60%. The left ventricle has normal  function. The left ventricle has no regional wall motion abnormalities.  Left ventricular diastolic parameters are  indeterminate.  2. Right ventricular systolic function is normal. The right ventricular  size is normal. There is moderately elevated pulmonary artery systolic  pressure. The estimated right ventricular systolic pressure is 69.4 mmHg.  3. Left atrial size was moderately dilated.  4. Right atrial size was moderately dilated.  5. Mild restriction of the posterior MV leaflet. There is reverberation  artificat in the LA from clacified MV apparatus. . The mitral valve is    degenerative. Moderate mitral valve regurgitation. No evidence of mitral  stenosis.  6. Tricuspid valve regurgitation is moderate.  7. The aortic valve is tricuspid. Aortic valve regurgitation is not  visualized. Mild aortic valve sclerosis is present, with no evidence of  aortic valve stenosis.  8. The inferior vena cava is dilated in size with >50% respiratory  variability, suggesting right atrial pressure of 8 mmHg.   Patient Profile     86 y.o. female  with a hx of PAF not on anticoag, PACs, HTN, OA, asthma, was admitted 05/30 with diarrhea, weakness, low K+, rapid Afib. HR has remained elevated, cards asked to see by Dr Starla Link.  Assessment & Plan    1.  Chronic atrial fibrillation with RVR --HR improved and now controlled in the 80's on tele -Prior to admission, she was on Lopressor 50 mg twice daily.  -Because of low blood pressure, she was initially on 25 mg twice daily. -Lopressor has been uptitrated to 75 mg twice daily and then 100mg  BID  -According to the patient, she is in atrial fibrillation all the time and has been for years.  However, her ECG in 2019 was sinus rhythm. -Both atria are moderately dilated on echo -At this time, rate control is best option and suspect rate control will continue to need to be the best option going forward -CHA2DS2-VASc is 27 (age x 2, female, HTN), but she has never been on blood thinners and does not ever remember having discussion with her PCP about them. -It could be that her PCP considers the risk to outweigh the benefits because of age and fall risk, MD to review --she has been started on Eliquis 2.5mg  BID  --continue Lopressor 100mg  BID and change Cardizem to CD 120mg  daily --stop ASA due to DOAC  2.  Mitral regurgitation -moderate by echo -LA moderate dilated -continue to follow  3.  HTN -BP stable at 133/52mmHg -continue Lopressor 100mg  BID and change Cardizem short acting to CD 120mg  daily  [facility] will sign off.    Medication Recommendations:  Apixaban 2.5mg  BID, Cardizem CD 120mg  daily, Lopressor 100mg  BID Other recommendations (labs, testing, etc):  none Follow up as an  outpatient:  Dr. Gardiner Rhyme in 2 weeks  For questions or updates, please contact [place] Please consult [email or web address] for contact info under Cardiology/STEMI.      Signed, Fransico Him, MD  01/25/2020, 7:39 AM

## 2020-01-25 NOTE — Discharge Summary (Addendum)
Physician Discharge Summary  Terri Acosta WLN:[id number] DOB: 1923/05/03 DOA: 01/18/2020  PCP: [name], [name], NP  Admit date: 01/18/2020 Discharge date: 01/26/2020  Admitted From: ALF Disposition: SNF  Recommendations for Outpatient Follow-up:  1. Follow up with SNF provider at earliest convenience with repeat CBC/BMP within the next week  2. Outpatient follow-up with cardiology  3. follow up in ED if symptoms worsen or new appear   Home Health: No Equipment/Devices: None  Discharge Condition: Guarded CODE STATUS: DNR Diet recommendation: Heart healthy  Brief/Interim Summary: 86 y.o.femalewho has a history of paroxysmal atrial fibrillation not on chronic anticoagulation for unknown reasons (question if due to age), hypertension presented with worsening diarrhea for the last 2 to 3 weeks.  In the ED, she was found to be tachycardic, hypokalemic.  Stool studies was negative for C. difficile.  She was admitted for further evaluation and management.  During the hospitalization, CT of the abdomen showed possible colitis versus less likely sigmoid diverticulitis along with early cirrhosis.  She was started on IV antibiotics.  Gradually IV fluids were discontinued.  Imodium was used for diarrhea.  She also had A. fib with RVR despite increased doses of metoprolol.  Cardiology was consulted who started Eliquis.  Patient has been started on long-acting Cardizem from today and cardiology has signed off.  Patient will be discharged to SNF once bed is available.  Addendum on 01/26/20: Patient is currently waiting for discharge to nursing home.  She was supposed to be discharged yesterday but there was no bed available.  Currently medically stable for discharge to SNF once bed is available.   Discharge Diagnoses:   Probable colitis with possibility of sigmoid diverticulitis causing diarrhea -Presented with worsening diarrhea for the last 2 to 3 weeks.  Stool studies was negative for C. Difficile. -CT  of the abdomen done on 01/20/2020 showed possible colitis versus less likely sigmoid diverticulitis along with early cirrhosis.  Cirrhosis can be followed up as an outpatient -Started on Rocephin and Flagyl for colitis on 01/21/2020.  Use Imodium as needed. -Diarrhea has much improved. -Off IV fluids. -Discharge today to SNF if bed is available on Vantin and Flagyl for 7 more days.  Use Imodium as needed.  Paroxysmal A. fib with RVR -Rate controlled this morning. - Continue metoprolol 100 mg twice a day.    Cardiology evaluation and follow-up appreciated.  Patient was switched to Cardizem 120 mg long-acting once a day by cardiology today.  Cardiology has signed off.  Outpatient follow-up with cardiology. Patient also has been started on low-dose oral Eliquis by cardiology from 01/23/2020 which will be continued.    Hypertension -Monitor blood pressure.  Metoprolol and Cardizem plan as above; Hyzaar has been discontinued.  Outpatient follow-up  Normocytic anemia -Hemoglobin stable.  Monitor intermittently.  Generalized conditioning -PT/OT recommend SNF placement.    Discharge to SNF once bed is available    Discharge Instructions  Discharge Instructions    Ambulatory referral to Cardiology   Complete by: As directed    Diet - low sodium heart healthy   Complete by: As directed    Increase activity slowly   Complete by: As directed      Allergies as of 01/25/2020      Reactions   Lomotil [diphenoxylate] Nausea And Vomiting   Sulfa Antibiotics Other (See Comments)   Unknown    Amlodipine Swelling      Medication List    STOP taking these medications   aspirin EC 81  MG tablet   losartan-hydrochlorothiazide 100-25 MG tablet Commonly known as: HYZAAR   naproxen 250 MG tablet Commonly known as: NAPROSYN     TAKE these medications   apixaban 2.5 MG Tabs tablet Commonly known as: ELIQUIS Take 1 tablet (2.5 mg total) by mouth 2 (two) times daily.   cefpodoxime 200 MG  tablet Commonly known as: VANTIN Take 1 tablet (200 mg total) by mouth 2 (two) times daily for 7 days.   diltiazem 120 MG 24 hr capsule Commonly known as: CARDIZEM CD Take 1 capsule (120 mg total) by mouth daily.   loperamide 2 MG capsule Commonly known as: IMODIUM Take 1 capsule (2 mg total) by mouth every 6 (six) hours as needed for diarrhea or loose stools.   metoprolol tartrate 50 MG tablet Commonly known as: LOPRESSOR Take 2 tablets (100 mg total) by mouth 2 (two) times daily. What changed:   how much to take  how to take this  when to take this  additional instructions   metroNIDAZOLE 500 MG tablet Commonly known as: FLAGYL Take 1 tablet (500 mg total) by mouth 3 (three) times daily for 7 days.   potassium chloride SA 20 MEQ tablet Commonly known as: KLOR-CON Take 1 tablet (20 mEq total) by mouth daily. What changed:   when to take this  additional instructions      Contact information for after-discharge care    Destination    [facility] .   Service: Skilled Nursing Contact information: [address] [address] [phone]             Allergies  Allergen Reactions  . Lomotil [Diphenoxylate] Nausea And Vomiting  . Sulfa Antibiotics Other (See Comments)    Unknown   . Amlodipine Swelling    Consultations:  Cardiology   Procedures/Studies: CT ABDOMEN PELVIS W CONTRAST  Result Date: 01/20/2020 CLINICAL DATA:  86 year old female with diarrhea. EXAM: CT ABDOMEN AND PELVIS WITH CONTRAST TECHNIQUE: Multidetector CT imaging of the abdomen and pelvis was performed using the standard protocol following bolus administration of intravenous contrast. CONTRAST:  159mL OMNIPAQUE IOHEXOL 300 MG/ML  SOLN COMPARISON:  Abdominal radiograph dated 01/19/2020 FINDINGS: Lower chest: Small bilateral pleural effusions with associated partial compressive atelectasis of the lower lobes. Pneumonia is not excluded.  Clinical correlation is recommended. There is mild cardiomegaly. Multi vessel coronary vascular calcification. No intra-abdominal free air. Diffuse mesenteric edema and small perihepatic ascites. Hepatobiliary: Minimal irregularity of the liver contour may represent early changes of cirrhosis. Clinical correlation is recommended. No intrahepatic biliary ductal dilatation. The gallbladder is unremarkable. Pancreas: Unremarkable. No pancreatic ductal dilatation or surrounding inflammatory changes. Spleen: Normal in size without focal abnormality. Adrenals/Urinary Tract: The adrenal glands are unremarkable. There is no hydronephrosis on either side. There is symmetric enhancement and excretion of contrast by both kidneys. There is a 2 cm left renal interpolar cyst and several subcentimeter hypodensities which are too small to characterize. The visualized ureters and urinary bladder appear unremarkable. Stomach/Bowel: There is scattered sigmoid diverticula. Diffuse thickening of the distal colon primarily involving the rectosigmoid most consistent with colitis. Clinical correlation is recommended. There is no bowel obstruction. The appendix is normal. Vascular/Lymphatic: Advanced aortoiliac atherosclerotic disease. There is a 3.5 cm partially thrombosed infrarenal abdominal aortic aneurysm. The IVC is unremarkable. No portal venous gas. There is no adenopathy. Reproductive: The uterus is grossly unremarkable. Other: Diffuse subcutaneous edema and anasarca. Musculoskeletal: Osteopenia with degenerative changes of the spine. No acute osseous pathology.  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 IMPRESSION: 1. Findings most likely represent colitis and less likely sigmoid diverticulitis. Clinical correlation is recommended. No bowel obstruction. Normal appendix. 2. Small bilateral pleural effusions, small ascites and anasarca. 3. 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 4. A 3.5 cm partially thrombosed infrarenal abdominal aortic  aneurysm. 5. Aortic Atherosclerosis (ICD10-I70.0). Electronically Signed   By: Anner Crete M.D.   On: 01/20/2020 20:29   DG Chest Port 1 View  Result Date: 01/19/2020 CLINICAL DATA:  A-fib, dehydration; EXAM: PORTABLE CHEST 1 VIEW COMPARISON:  Radiograph 11/03/2010 FINDINGS: Normal cardiac silhouette. Lungs are hyperinflated. No effusion, infiltrate pneumothorax. No acute osseous abnormality. IMPRESSION: Hyperinflated lungs.  No acute findings. Electronically Signed   By: Suzy Bouchard M.D.   On: 01/19/2020 09:06   DG Abd Portable 1V  Result Date: 01/19/2020 CLINICAL DATA:  Diarrhea. EXAM: PORTABLE ABDOMEN - 1 VIEW COMPARISON:  None. FINDINGS: The bowel gas pattern is normal. No radio-opaque calculi or other significant radiographic abnormality are seen. Extensive vascular calcification noted. Advanced lumbar spine degenerative changes and chronic avascular necrosis of left hip noted. IMPRESSION: Unremarkable bowel gas pattern. No acute findings. Electronically Signed   By: Marlaine Hind M.D.   On: 01/19/2020 12:12   ECHOCARDIOGRAM COMPLETE  Result Date: 01/22/2020    ECHOCARDIOGRAM REPORT   Patient Name:   Terri Acosta Date of Exam: 01/19/2020 Medical Rec #:  [id number]     Height:       62.0 in Accession #:    [id number]    Weight:       102.1 lb Date of Birth:  1923-06-05     BSA:          1.437 m Patient Age:    27 years      BP:           141/93 mmHg Patient Gender: F             HR:           117 bpm. Exam Location:  Inpatient Procedure: 2D Echo Indications:     atrial fibrillation 427.31  History:         Patient has no prior history of Echocardiogram examinations.                  Risk Factors:Hypertension and Dyslipidemia.  Sonographer:     Johny Chess RDCS Referring Phys:  [id number] [place] Diagnosing Phys: Adrian Prows MD IMPRESSIONS  1. Patient in atrial fibrillation with RVR. Left ventricular ejection fraction, by estimation, is 55 to 60%. The left ventricle has normal  function. The left ventricle has no regional wall motion abnormalities. Left ventricular diastolic parameters are indeterminate.  2. Right ventricular systolic function is normal. The right ventricular size is normal. There is moderately elevated pulmonary artery systolic pressure. The estimated right ventricular systolic pressure is 80.9 mmHg.  3. Left atrial size was moderately dilated.  4. Right atrial size was moderately dilated.  5. Mild restriction of the posterior MV leaflet. There is reverberation artificat in the LA from clacified MV apparatus. . The mitral valve is degenerative. Moderate mitral valve regurgitation. No evidence of mitral stenosis.  6. Tricuspid valve regurgitation is moderate.  7. The aortic valve is tricuspid. Aortic valve regurgitation is not visualized. Mild aortic valve sclerosis is present, with no evidence of aortic valve stenosis.  8. The inferior vena cava is dilated in size with >50% respiratory variability, suggesting right atrial pressure of 8 mmHg. FINDINGS  Left Ventricle: Patient in atrial fibrillation with RVR. Left ventricular ejection fraction, by estimation, is 55 to 60%. The left ventricle has normal function. The left ventricle has no regional wall motion abnormalities. The left ventricular internal  cavity size was normal in size. There is no left ventricular hypertrophy. Left ventricular diastolic parameters are indeterminate. Right Ventricle: The right ventricular size is normal. No increase in right ventricular wall thickness. Right ventricular systolic function is normal. There is moderately elevated pulmonary artery systolic pressure. The tricuspid regurgitant velocity is 3.08 m/s, and with an assumed right atrial pressure of 8 mmHg, the estimated right ventricular systolic pressure is 73.4 mmHg. Left Atrium: Left atrial size was moderately dilated. Right Atrium: Right atrial size was moderately dilated. Pericardium: There is no evidence of pericardial effusion.  Mitral Valve: Mild restriction of the posterior MV leaflet. There is reverberation artificat in the LA from clacified MV apparatus. The mitral valve is degenerative in appearance. There is mild thickening of the mitral valve leaflet(s). There is mild calcification of the mitral valve leaflet(s). Mild mitral annular calcification. Moderate mitral valve regurgitation. No evidence of mitral valve stenosis. Tricuspid Valve: The tricuspid valve is grossly normal. Tricuspid valve regurgitation is moderate. Aortic Valve: The aortic valve is tricuspid. Aortic valve regurgitation is not visualized. Mild aortic valve sclerosis is present, with no evidence of aortic valve stenosis. Pulmonic Valve: The pulmonic valve was grossly normal. Pulmonic valve regurgitation is trivial. Aorta: The aortic root is normal in size and structure. Venous: The inferior vena cava is dilated in size with greater than 50% respiratory variability, suggesting right atrial pressure of 8 mmHg. IAS/Shunts: No atrial level shunt detected by color flow Doppler.  LEFT VENTRICLE PLAX 2D LVIDd:         4.00 cm LVIDs:         3.00 cm LV PW:         1.20 cm LV IVS:        1.10 cm LVOT diam:     2.00 cm LVOT Area:     3.14 cm  IVC IVC diam: 2.20 cm LEFT ATRIUM             Index       RIGHT ATRIUM           Index LA diam:        3.60 cm 2.51 cm/m  RA Area:     16.60 cm LA Vol (A2C):   67.0 ml 46.64 ml/m RA Volume:   39.20 ml  27.29 ml/m LA Vol (A4C):   56.0 ml 38.98 ml/m LA Biplane Vol: 66.9 ml 46.57 ml/m   AORTA Ao Root diam: 3.40 cm Ao Asc diam:  3.20 cm TRICUSPID VALVE TR Peak grad:   37.9 mmHg TR Vmax:        308.00 cm/s  SHUNTS Systemic Diam: 2.00 cm Adrian Prows MD Electronically signed by Adrian Prows MD Signature Date/Time: 01/22/2020/5:34:36 PM    Final        Subjective: Patient seen and examined at bedside.  She feels much better but still feels weak.  Diarrhea has much improved.  Tolerating diet.  No overnight fever or vomiting  reported.  Discharge Exam: Vitals:   01/25/20 0035 01/25/20 0549  BP: 138/76 133/75  Pulse: 71 71  Resp:    Temp:  97.7 F (36.5 C)  SpO2:      General: Pt is alert, awake, not in acute distress.  Elderly female lying in bed. Cardiovascular: rate  controlled, S1/S2 + Respiratory: bilateral decreased breath sounds at bases Abdominal: Soft, NT, ND, bowel sounds + Extremities: Trace lower extremity edema; no cyanosis    The results of significant diagnostics from this hospitalization (including imaging, microbiology, ancillary and laboratory) are listed below for reference.     Microbiology: Recent Results (from the past 240 hour(s))  SARS Coronavirus 2 by RT PCR (hospital order, performed in [facility] hospital lab) Nasopharyngeal Nasopharyngeal Swab     Status: None   Collection Time: 01/18/20  2:56 PM   Specimen: Nasopharyngeal Swab  Result Value Ref Range Status   SARS Coronavirus 2 NEGATIVE NEGATIVE Final    Comment: (NOTE) SARS-CoV-2 target nucleic acids are NOT DETECTED. The SARS-CoV-2 RNA is generally detectable in upper and lower respiratory specimens during the acute phase of infection. The lowest concentration of SARS-CoV-2 viral copies this assay can detect is 250 copies / mL. A negative result does not preclude SARS-CoV-2 infection and should not be used as the sole basis for treatment or other patient management decisions.  A negative result may occur with improper specimen collection / handling, submission of specimen other than nasopharyngeal swab, presence of viral mutation(s) within the areas targeted by this assay, and inadequate number of viral copies (<250 copies / mL). A negative result must be combined with clinical observations, patient history, and epidemiological information. Fact Sheet for Patients:   StrictlyIdeas.no Fact Sheet for Healthcare Providers: BankingDealers.co.za This test is not yet  approved or cleared  by the [place] FDA and has been authorized for detection and/or diagnosis of SARS-CoV-2 by FDA under an Emergency Use Authorization (EUA).  This EUA will remain in effect (meaning this test can be used) for the duration of the COVID-19 declaration under Section 564(b)(1) of the Act, 21 U.S.C. section 360bbb-3(b)(1), unless the authorization is terminated or revoked sooner. Performed at [facility], [place] [address] [place], [place] [address]   Gastrointestinal Panel by PCR , Stool     Status: None   Collection Time: 01/19/20  3:00 AM   Specimen: Stool  Result Value Ref Range Status   Campylobacter species NOT DETECTED NOT DETECTED Final   Plesimonas shigelloides NOT DETECTED NOT DETECTED Final   Salmonella species NOT DETECTED NOT DETECTED Final   Yersinia enterocolitica NOT DETECTED NOT DETECTED Final   Vibrio species NOT DETECTED NOT DETECTED Final   Vibrio cholerae NOT DETECTED NOT DETECTED Final   Enteroaggregative E coli (EAEC) NOT DETECTED NOT DETECTED Final   Enteropathogenic E coli (EPEC) NOT DETECTED NOT DETECTED Final   Enterotoxigenic E coli (ETEC) NOT DETECTED NOT DETECTED Final   Shiga like toxin producing E coli (STEC) NOT DETECTED NOT DETECTED Final   Shigella/Enteroinvasive E coli (EIEC) NOT DETECTED NOT DETECTED Final   Cryptosporidium NOT DETECTED NOT DETECTED Final   Cyclospora cayetanensis NOT DETECTED NOT DETECTED Final   Entamoeba histolytica NOT DETECTED NOT DETECTED Final   Giardia lamblia NOT DETECTED NOT DETECTED Final   Adenovirus F40/41 NOT DETECTED NOT DETECTED Final   Astrovirus NOT DETECTED NOT DETECTED Final   Norovirus GI/GII NOT DETECTED NOT DETECTED Final   Rotavirus A NOT DETECTED NOT DETECTED Final   Sapovirus (I, II, IV, and V) NOT DETECTED NOT DETECTED Final    Comment: Performed at [facility], [address] [place], [place] [address]  C Difficile Quick Screen w PCR reflex     Status:  None   Collection Time: 01/19/20  3:00 AM   Specimen: STOOL  Result Value Ref Range Status   C Diff antigen NEGATIVE NEGATIVE Final   C Diff toxin NEGATIVE NEGATIVE Final   C Diff interpretation No C. difficile detected.  Final    Comment: Performed at [facility], [place] [address] [place], [place] [address]  Urine Culture     Status: Abnormal   Collection Time: 01/19/20 11:30 AM   Specimen: Urine, Random  Result Value Ref Range Status   Specimen Description   Final    URINE, RANDOM Performed at [place] [address] [place], [place] [address]    Special Requests   Final    NONE Performed at [facility], [place] [address] [place], [place] [address]    Culture (A)  Final    40,000 COLONIES/mL MULTIPLE SPECIES PRESENT, SUGGEST RECOLLECTION   Report Status 01/20/2020 FINAL  Final     Labs: BNP (last 3 results) No results for input(s): BNP in the last 8760 hours. Basic Metabolic Panel: Recent Labs  Lab 01/21/20 0359 01/22/20 0323 01/23/20 0336 01/24/20 0414 01/25/20 0428  NA 134* 134* 134* 133* 133*  K 3.8 3.8 3.7 3.7 3.6  CL 100 103 106 103 103  CO2 23 21* 20* 22 21*  GLUCOSE 100* 100* 103* 123* 118*  BUN 21 17 16 17 15   CREATININE 0.64 0.65 0.62 0.70 0.61  CALCIUM 7.8* 7.9* 7.9* 8.2* 8.0*  MG 1.8 1.8 1.7 1.6* 2.1   Liver Function Tests: Recent Labs  Lab 01/18/20 1302  AST 14*  ALT 10  ALKPHOS 47  BILITOT 1.0  PROT 5.8*  ALBUMIN 3.0*   No results for input(s): LIPASE, AMYLASE in the last 168 hours. No results for input(s): AMMONIA in the last 168 hours. CBC: Recent Labs  Lab 01/21/20 0359 01/22/20 0323 01/23/20 0336 01/24/20 0414 01/25/20 0428  WBC 8.4 9.4 10.7* 11.8* 12.6*  NEUTROABS 6.5 7.3 8.6* 9.6* 10.6*  HGB 10.3* 10.3* 10.5* 11.1* 9.9*  HCT 32.9* 33.1* 34.0* 35.7* 31.4*  MCV 88.7 89.2 89.9 88.1 87.7  PLT 314 307 300 332 279   Cardiac Enzymes: No results for input(s): CKTOTAL,  CKMB, CKMBINDEX, TROPONINI in the last 168 hours. BNP: Invalid input(s): POCBNP CBG: No results for input(s): GLUCAP in the last 168 hours. D-Dimer No results for input(s): DDIMER in the last 72 hours. Hgb A1c No results for input(s): HGBA1C in the last 72 hours. Lipid Profile No results for input(s): CHOL, HDL, LDLCALC, TRIG, CHOLHDL, LDLDIRECT in the last 72 hours. Thyroid function studies No results for input(s): TSH, T4TOTAL, T3FREE, THYROIDAB in the last 72 hours.  Invalid input(s): FREET3 Anemia work up No results for input(s): VITAMINB12, FOLATE, FERRITIN, TIBC, IRON, RETICCTPCT in the last 72 hours. Urinalysis    Component Value Date/Time   COLORURINE AMBER (A) 01/19/2020 1130   APPEARANCEUR CLOUDY (A) 01/19/2020 1130   LABSPEC 1.018 01/19/2020 1130   PHURINE 5.0 01/19/2020 1130   GLUCOSEU NEGATIVE 01/19/2020 1130   HGBUR SMALL (A) 01/19/2020 1130   BILIRUBINUR NEGATIVE 01/19/2020 1130   BILIRUBINUR negative 07/02/2018 1700   KETONESUR NEGATIVE 01/19/2020 1130   PROTEINUR 30 (A) 01/19/2020 1130   UROBILINOGEN 0.2 07/02/2018 1700   NITRITE NEGATIVE 01/19/2020 1130   LEUKOCYTESUR LARGE (A) 01/19/2020 1130   Sepsis Labs Invalid input(s): PROCALCITONIN,  WBC,  LACTICIDVEN Microbiology Recent Results (from the past 240 hour(s))  SARS Coronavirus 2 by RT PCR (hospital order, performed in [facility] hospital lab) Nasopharyngeal Nasopharyngeal Swab  Status: None   Collection Time: 01/18/20  2:56 PM   Specimen: Nasopharyngeal Swab  Result Value Ref Range Status   SARS Coronavirus 2 NEGATIVE NEGATIVE Final    Comment: (NOTE) SARS-CoV-2 target nucleic acids are NOT DETECTED. The SARS-CoV-2 RNA is generally detectable in upper and lower respiratory specimens during the acute phase of infection. The lowest concentration of SARS-CoV-2 viral copies this assay can detect is 250 copies / mL. A negative result does not preclude SARS-CoV-2 infection and should not be used as  the sole basis for treatment or other patient management decisions.  A negative result may occur with improper specimen collection / handling, submission of specimen other than nasopharyngeal swab, presence of viral mutation(s) within the areas targeted by this assay, and inadequate number of viral copies (<250 copies / mL). A negative result must be combined with clinical observations, patient history, and epidemiological information. Fact Sheet for Patients:   StrictlyIdeas.no Fact Sheet for Healthcare Providers: BankingDealers.co.za This test is not yet approved or cleared  by the [place] FDA and has been authorized for detection and/or diagnosis of SARS-CoV-2 by FDA under an Emergency Use Authorization (EUA).  This EUA will remain in effect (meaning this test can be used) for the duration of the COVID-19 declaration under Section 564(b)(1) of the Act, 21 U.S.C. section 360bbb-3(b)(1), unless the authorization is terminated or revoked sooner. Performed at [facility], [place] [address], [place], [place] [address]   Gastrointestinal Panel by PCR , Stool     Status: None   Collection Time: 01/19/20  3:00 AM   Specimen: Stool  Result Value Ref Range Status   Campylobacter species NOT DETECTED NOT DETECTED Final   Plesimonas shigelloides NOT DETECTED NOT DETECTED Final   Salmonella species NOT DETECTED NOT DETECTED Final   Yersinia enterocolitica NOT DETECTED NOT DETECTED Final   Vibrio species NOT DETECTED NOT DETECTED Final   Vibrio cholerae NOT DETECTED NOT DETECTED Final   Enteroaggregative E coli (EAEC) NOT DETECTED NOT DETECTED Final   Enteropathogenic E coli (EPEC) NOT DETECTED NOT DETECTED Final   Enterotoxigenic E coli (ETEC) NOT DETECTED NOT DETECTED Final   Shiga like toxin producing E coli (STEC) NOT DETECTED NOT DETECTED Final   Shigella/Enteroinvasive E coli (EIEC) NOT DETECTED NOT DETECTED Final    Cryptosporidium NOT DETECTED NOT DETECTED Final   Cyclospora cayetanensis NOT DETECTED NOT DETECTED Final   Entamoeba histolytica NOT DETECTED NOT DETECTED Final   Giardia lamblia NOT DETECTED NOT DETECTED Final   Adenovirus F40/41 NOT DETECTED NOT DETECTED Final   Astrovirus NOT DETECTED NOT DETECTED Final   Norovirus GI/GII NOT DETECTED NOT DETECTED Final   Rotavirus A NOT DETECTED NOT DETECTED Final   Sapovirus (I, II, IV, and V) NOT DETECTED NOT DETECTED Final    Comment: Performed at [facility], [address] [place], [place] [address]  C Difficile Quick Screen w PCR reflex     Status: None   Collection Time: 01/19/20  3:00 AM   Specimen: STOOL  Result Value Ref Range Status   C Diff antigen NEGATIVE NEGATIVE Final   C Diff toxin NEGATIVE NEGATIVE Final   C Diff interpretation No C. difficile detected.  Final    Comment: Performed at [facility], [place] [address], [place], [place] [address]  Urine Culture     Status: Abnormal   Collection Time: 01/19/20 11:30 AM   Specimen: Urine, Random  Result Value Ref Range Status   Specimen  Description   Final    URINE, RANDOM Performed at [facility], [place] [address], [place], [place] [address]    Special Requests   Final    NONE Performed at [facility], [place] [address], [place], [place] [address]    Culture (A)  Final    40,000 COLONIES/mL MULTIPLE SPECIES PRESENT, SUGGEST RECOLLECTION   Report Status 01/20/2020 FINAL  Final     Time coordinating discharge: 35 minutes  SIGNED:   Aline August, MD  [facility] 01/25/2020, 8:53 AM

## 2020-01-25 NOTE — Progress Notes (Signed)
Dr. Radford Pax requests f/u with Dr. Gardiner Rhyme in 2 weeks. I have sent a message to our office's scheduling team requesting a follow-up appointment, and our office will call the patient with this information.  Naydene Kamrowski PA-C

## 2020-01-25 NOTE — Discharge Instructions (Signed)

## 2020-01-26 LAB — BASIC METABOLIC PANEL
Anion gap: 7 (ref 5–15)
BUN: 15 mg/dL (ref 8–23)
CO2: 22 mmol/L (ref 22–32)
Calcium: 7.7 mg/dL — ABNORMAL LOW (ref 8.9–10.3)
Chloride: 101 mmol/L (ref 98–111)
Creatinine, Ser: 0.66 mg/dL (ref 0.44–1.00)
GFR calc Af Amer: >60 mL/min (ref >60)
GFR calc non Af Amer: >60 mL/min (ref >60)
Glucose, Bld: 109 mg/dL — ABNORMAL HIGH (ref 70–99)
Potassium: 3.6 mmol/L (ref 3.5–5.1)
Sodium: 130 mmol/L — ABNORMAL LOW (ref 135–145)

## 2020-01-26 LAB — CBC WITH DIFFERENTIAL/PLATELET
Abs Immature Granulocytes: 0.12 10*3/uL — ABNORMAL HIGH (ref 0.00–0.07)
Basophils Absolute: 0 10*3/uL (ref 0.0–0.1)
Basophils Relative: 0 %
Eosinophils Absolute: 0.1 10*3/uL (ref 0.0–0.5)
Eosinophils Relative: 1 %
HCT: 31.6 % — ABNORMAL LOW (ref 36.0–46.0)
Hemoglobin: 10 g/dL — ABNORMAL LOW (ref 12.0–15.0)
Immature Granulocytes: 1 %
Lymphocytes Relative: 11 %
Lymphs Abs: 1.1 10*3/uL (ref 0.7–4.0)
MCH: 27.5 pg (ref 26.0–34.0)
MCHC: 31.6 g/dL (ref 30.0–36.0)
MCV: 86.8 fL (ref 80.0–100.0)
Monocytes Absolute: 0.5 10*3/uL (ref 0.1–1.0)
Monocytes Relative: 5 %
Neutro Abs: 8.6 10*3/uL — ABNORMAL HIGH (ref 1.7–7.7)
Neutrophils Relative %: 82 %
Platelets: 255 10*3/uL (ref 150–400)
RBC: 3.64 MIL/uL — ABNORMAL LOW (ref 3.87–5.11)
RDW: 14.6 % (ref 11.5–15.5)
WBC: 10.5 10*3/uL (ref 4.0–10.5)
nRBC: 0 % (ref 0.0–0.2)

## 2020-01-26 LAB — MAGNESIUM: Magnesium: 1.8 mg/dL (ref 1.7–2.4)

## 2020-01-26 LAB — SARS CORONAVIRUS 2 BY RT PCR (HOSPITAL ORDER, PERFORMED IN ~~LOC~~ HOSPITAL LAB): SARS Coronavirus 2: NEGATIVE

## 2020-01-26 NOTE — Progress Notes (Signed)
Patient ID: Terri Acosta, female   DOB: 05-29-1923, 86 y.o.   MRN: [id number] Patient is currently waiting for discharge to nursing home.  She was supposed to be discharged yesterday but there was no bed available.  Patient seen and examined at bedside.  Currently medically stable for discharge.  Please refer to the discharge summary done by me on 01/25/2020 for full details.

## 2020-01-26 NOTE — Care Management Important Message (Signed)
Important Message  Patient Details  Name: Terri Acosta MRN: [id number] Date of Birth: [birth year]   Medicare Important Message Given:     Document was given to Evette Cristal  to give to the patient.   Crista Luria 01/26/2020, 10:51 AM

## 2020-01-26 NOTE — TOC Progression Note (Signed)
Transition of Care [facility]) - Progression Note    Patient Details  Name: Terri Acosta MRN: [id number] Date of Birth: 08/15/1923  Transition of Care [facility]) CM/SW Contact  Ross Ludwig, [place] Phone Number: 01/26/2020, 10:16 AM  Clinical Narrative:     CSW spoke to insurance company, patient has been approved, authorization number is [id number], reference number [id number].  CSW spoke to SNF and they can accept patient today, pending a new negative Covid test, CSW requested physician order rapid test and updated bedside nurse.  CSW to continue to follow patient's progress throughout discharge planning.   Expected Discharge Plan: [place] Barriers to Discharge: Continued Medical Work up  Expected Discharge Plan and Services Expected Discharge Plan: [place] In-house Referral: Clinical Social Work   Post Acute Care Choice: [place] Living arrangements for the past 2 months: [place] Expected Discharge Date: 01/25/20                                     Social Determinants of Health (SDOH) Interventions    Readmission Risk Interventions No flowsheet data found.

## 2020-01-26 NOTE — TOC Transition Note (Signed)
Transition of Care [facility]) - CM/SW Discharge Note   Patient Details  Name: Terri Acosta MRN: [id number] Date of Birth: 1923/07/31  [facility]) CM/SW Contact:  Ross Ludwig, LCSW Phone Number: 01/26/2020, 3:53 PM   Clinical Narrative:     Patient to be d/c'ed today to [facility] room 28.  Patient and family agreeable to plans will transport via ems RN to call report.  Patient's daughter is aware of patient discharging today.   Final next level of care: [place] Barriers to Discharge: Barriers Resolved   Patient Goals and CMS Choice Patient states their goals for this hospitalization and ongoing recovery are:: To go to SNF for short term rehab, then return back home to [place]. CMS Medicare.gov Compare Post Acute Care list provided to:: Patient Choice offered to / list presented to : Patient  Discharge Placement PASRR number recieved: 01/22/20            Patient [facility] bed at: [facility] Patient to be transferred to facility by: [facility] Name of family member notified: Daughter Santiago Glad [phone] Patient and family notified of of transfer: 01/26/20  Discharge Plan and Services In-house Referral: Clinical Social Work   Post Acute Care Choice: [place]                               Social Determinants of Health (SDOH) Interventions     Readmission Risk Interventions No flowsheet data found.

## 2020-01-26 NOTE — Progress Notes (Addendum)
Physical Therapy Treatment Patient Details Name: Terri Acosta MRN: [id number] DOB: [birth year] Today's Date: 01/26/2020    History of Present Illness 86 y.o. female who has a history of paroxysmal atrial fibrillation not on chronic anticoagulation, hyperlipidemia, hypertension admitted with 2-3 week history diarrhea.  Pt also found to have afib.    PT Comments    Pt tolerates therapeutic exercises with cues for form. Pt declines further ambulation 2* fear of falling due to leg length difference. Pt requires min assist to get BLE back into bed due to weakness. Pt denies pain, dizziness, heart racing, lightheadedness and HR in 80-90s during treatment session. Patient will benefit from continued physical therapy in hospital and recommendations below to increase strength, balance, endurance for safe ADLs and gait.   Follow Up Recommendations  SNF;Supervision for mobility/OOB     Equipment Recommendations  None recommended by PT    Recommendations for Other Services       Precautions / Restrictions Precautions Precautions: Fall Restrictions Weight Bearing Restrictions: No    Mobility  Bed Mobility Overal bed mobility: Needs Assistance Bed Mobility: Supine to Sit;Sit to Supine  Supine to sit: Min guard;HOB elevated Sit to supine: Min assist  General bed mobility comments: min guard with HOB elevated and use of bedrail to come to sitting and scoot to EOB with increased time; min assist for BLE lifting back into bed  Transfers Overall transfer level: Needs assistance Equipment used: Rolling walker (2 wheeled) Transfers: Sit to/from Stand Sit to Stand: Min assist  General transfer comment: min assist to power up, cues to push from EOB and shift weight into balls of feed to improve posterior weight shift  Ambulation/Gait Ambulation/Gait assistance: Min assist Gait Distance (Feet): 4 Feet Assistive device: Rolling walker (2 wheeled) Gait Pattern/deviations: Step-to  pattern;Decreased stride length Gait velocity: decreased   General Gait Details: slow steps with dependence on RW for steadying, min assist to steady, continues to have fear of falling and declines further ambulation 2* fear of falling with RW   Stairs             Wheelchair Mobility    Modified Rankin (Stroke Patients Only)       Balance Overall balance assessment: Needs assistance Sitting-balance support: No upper extremity supported;Feet supported Sitting balance-Leahy Scale: Good Sitting balance - Comments: seated EOB with exercise   Standing balance support: During functional activity;Bilateral upper extremity supported Standing balance-Leahy Scale: Poor Standing balance comment: reliant on RW             Cognition Arousal/Alertness: Awake/alert Behavior During Therapy: WFL for tasks assessed/performed Overall Cognitive Status: Within Functional Limits for tasks assessed       Exercises General Exercises - Lower Extremity Ankle Circles/Pumps: Supine;Strengthening;Both;15 reps Long Arc Quad: Seated;Strengthening;Both;10 reps Heel Slides: Supine;Strengthening;Both;10 reps Hip Flexion/Marching: Seated;Strengthening;Both;10 reps    General Comments General comments (skin integrity, edema, etc.): HR 80-90s during session, denies racing sensation, lightheadedness or dizziness      Pertinent Vitals/Pain Pain Assessment: No/denies pain    Home Living                      Prior Function            PT Goals (current goals can now be found in the care plan section) Acute Rehab PT Goals Patient Stated Goal: to feel better PT Goal Formulation: With patient Time For Goal Achievement: 02/03/20 Potential to Achieve Goals: Good Progress towards PT goals: Progressing toward goals  Frequency    Min 2X/week      PT Plan Current plan remains appropriate    Co-evaluation              AM-PAC PT "6 Clicks" Mobility   Outcome Measure   Help needed turning from your back to your side while in a flat bed without using bedrails?: A Little Help needed moving from lying on your back to sitting on the side of a flat bed without using bedrails?: A Little Help needed moving to and from a bed to a chair (including a wheelchair)?: A Little Help needed standing up from a chair using your arms (e.g., wheelchair or bedside chair)?: A Little Help needed to walk in hospital room?: A Little Help needed climbing 3-5 steps with a railing? : A Lot 6 Click Score: 17    End of Session   Activity Tolerance: Patient tolerated treatment well Patient left: in bed;with call bell/phone within reach;with bed alarm set Nurse Communication: Mobility status PT Visit Diagnosis: Difficulty in walking, not elsewhere classified (R26.2);Muscle weakness (generalized) (M62.81)     Time: 6861-6837 PT Time Calculation (min) (ACUTE ONLY): 24 min  Charges:  $Therapeutic Exercise: 8-22 mins $Therapeutic Activity: 8-22 mins                      Tori Keenya Matera PT, DPT 01/26/20, 10:06 AM

## 2020-01-27 ENCOUNTER — Non-Acute Institutional Stay (SKILLED_NURSING_FACILITY): Payer: Medicare Other | Admitting: Nurse Practitioner

## 2020-01-27 ENCOUNTER — Encounter: Payer: Self-pay | Admitting: Nurse Practitioner

## 2020-01-27 DIAGNOSIS — I10 Essential (primary) hypertension: Secondary | ICD-10-CM

## 2020-01-27 DIAGNOSIS — R197 Diarrhea, unspecified: Secondary | ICD-10-CM

## 2020-01-27 DIAGNOSIS — I4891 Unspecified atrial fibrillation: Secondary | ICD-10-CM

## 2020-01-27 DIAGNOSIS — J449 Chronic obstructive pulmonary disease, unspecified: Secondary | ICD-10-CM

## 2020-01-27 DIAGNOSIS — E876 Hypokalemia: Secondary | ICD-10-CM

## 2020-01-27 DIAGNOSIS — R609 Edema, unspecified: Secondary | ICD-10-CM

## 2020-01-27 NOTE — Assessment & Plan Note (Signed)
Chronic cough, SOB, no O2 desaturation.

## 2020-01-27 NOTE — Assessment & Plan Note (Signed)
Persisted, negative C-diff in hospital, CT abd showed colitis vs diverticulitis, continue Flagyl, Imodium, update CBC, BMP

## 2020-01-27 NOTE — Assessment & Plan Note (Addendum)
Developing CHF vs peripheral edema may be related to Diltiazem(new upon dc), echo 01/19/20 EF 55-60%, underwent cardiology eval for Afib RVR, adding Furosemide 20mg  qd, increase Kcl 2meq qd, update CBC, CMP. Hx of AR to amlodipine with swelling, may have to dc Diltiazem if no better.

## 2020-01-27 NOTE — Assessment & Plan Note (Signed)
Blood pressure is controlled

## 2020-01-27 NOTE — Progress Notes (Signed)
Location:    [facility] Room Number: 28 Place of Service:  SNF (31) Provider: Marlana Latus NP  Bricia Taher X, NP  Patient Care Team: Nansi Birmingham X, NP as PCP - General (Internal Medicine) [place] Netta Cedars, MD as Consulting Physician (Orthopedic Surgery) Luberta Mutter, MD as Consulting Physician (Ophthalmology) Sydnee Levans, MD as Consulting Physician (Dermatology) Adrian Prows, MD as Consulting Physician (Cardiology)  Extended Emergency Contact Information Primary Emergency Contact: [place] Phone: [phone] Mobile Phone: [phone] Relation: Daughter  Code Status:  DNR Goals of care: Advanced Directive information Advanced Directives 01/18/2020  Does Patient Have a Medical Advance Directive? Yes  Type of Advance Directive [place]  Does patient want to make changes to medical advance directive? No - Patient declined  Copy of [place] in Chart? No - copy requested  Would patient like information on creating a medical advance directive? -     Chief Complaint  Patient presents with  . Acute Visit    swelling in legs    HPI:  Pt is a 86 y.o. female seen today for an acute visit for acute onset edema BLE, SOB, rapid heart beats 110-120 bpm. The patient stated she has developed edema BLE since left hospital yesterday, no pain, redness, or warmth in BLE, weak DP pulses. 01/19/20 echo EF 55-60%  Hospitalized 01/18/20-01/26/20, f/u CBC/BMP one wk per dc summary, AFib RVR, persisted, underwent cardiology eval, added Diltiazem upon discharge from hospital. The patient  presented ED with diarrhea, negative C-diff, CT abd colitis vs diverticulitis, on Flagyl. Had 4-5x diarrhea last night, the patient denied abd pain, she is afebrile.   Hx of Afib, HR not well controlled, but denied palpitation, chest pain, or pressure in chest. On Eliquis 2.52m bid, new Diltiazem 1224mqd  upon dc hospital, on  Metoprolol. 10028mid. HTN, blood pressure is controlled. Hypokalemia, on Kcl 45m24md. K 3.6 01/26/20. COPD chronic cough, SOB.    Past Medical History:  Diagnosis Date  . Abnormal uterine bleeding   . Actinic keratosis 02/13/2012  . Acute bronchospasm 10/31/2011  . Cancer (HCC)[place]/2009   Skin cancer of low back Good[place]ajane Jews  . Cataract   . Disorder of bone and cartilage, unspecified 02/18/2007  . Dizziness and giddiness 08/30/2010  . Dysmenorrhea   . Edema 12/19/2011  . Endometriosis   . 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 Osteoarthrosis, unspecified whether generalized or localized, unspecified site 08/20/2006  . Osteoporosis   . Other abnormal blood chemistry 03/14/2011  . Other and unspecified hyperlipidemia 10/18/2010  . Palpitations 02/13/2012  . Postmenopausal bleeding 08/30/2010  . Shortness of breath 11/28/2011  . Supraventricular premature beats 05/23/2011  . Unspecified essential hypertension 08/20/2006   Past Surgical History:  Procedure Laterality Date  . CARPAL TUNNEL RELEASE  2004   S. Norris MD  . CATARACT EXTRACTION W/ INTRAOCULAR LENS IMPLANT Right 2010  . CATARACT EXTRACTION W/ INTRAOCULAR LENS IMPLANT Left 10/2012  . DILATION AND CURETTAGE OF UTERUS    . EYE SURGERY Right 07/2009   cataract extraction/IOLI McCuEllie Lunch  . KNEE ARTHROSCOPY Right 2008   Torn meniscus, Dr. NorrVeverly FellsAllergies  Allergen Reactions  . Lomotil [Diphenoxylate] Nausea And Vomiting  . Sulfa Antibiotics Other (See Comments)    Unknown   . Amlodipine Swelling    Allergies as of 01/27/2020      Reactions   Lomotil [diphenoxylate] Nausea And Vomiting  Sulfa Antibiotics Other (See Comments)   Unknown    Amlodipine Swelling      Medication List       Accurate as of January 27, 2020  4:05 PM. If you have any questions, ask your nurse or doctor.        apixaban 2.5 MG Tabs tablet Commonly known as: ELIQUIS Take 1 tablet (2.5 mg total) by mouth 2  (two) times daily.   cefpodoxime 200 MG tablet Commonly known as: VANTIN Take 1 tablet (200 mg total) by mouth 2 (two) times daily for 7 days.   diltiazem 120 MG 24 hr capsule Commonly known as: CARDIZEM CD Take 1 capsule (120 mg total) by mouth daily.   loperamide 2 MG capsule Commonly known as: IMODIUM Take 1 capsule (2 mg total) by mouth every 6 (six) hours as needed for diarrhea or loose stools.   metoprolol tartrate 50 MG tablet Commonly known as: LOPRESSOR Take 2 tablets (100 mg total) by mouth 2 (two) times daily.   metroNIDAZOLE 500 MG tablet Commonly known as: FLAGYL Take 1 tablet (500 mg total) by mouth 3 (three) times daily for 7 days.   potassium chloride SA 20 MEQ tablet Commonly known as: KLOR-CON Take 1 tablet (20 mEq total) by mouth daily.   Tubersol 5 UNIT/0.1ML injection Generic drug: tuberculin Inject into the skin once. Once a day every 14 days   zinc oxide 20 % ointment Apply 1 application topically as needed for irritation.       Review of Systems  Constitutional: Negative for activity change, appetite change, chills, diaphoresis, fatigue and fever.  HENT: Positive for hearing loss. Negative for congestion, trouble swallowing and voice change.   Eyes: Negative for visual disturbance.  Respiratory: Positive for cough and shortness of breath. Negative for chest tightness and wheezing.   Cardiovascular: Positive for leg swelling. Negative for chest pain and palpitations.  Gastrointestinal: Positive for diarrhea. Negative for abdominal distention, constipation, nausea and vomiting.  Genitourinary: Negative for difficulty urinating, dysuria and urgency.  Musculoskeletal: Positive for gait problem.  Skin: Negative for color change and pallor.  Neurological: Negative for dizziness, speech difficulty, weakness and headaches.  Psychiatric/Behavioral: Negative for agitation, behavioral problems, hallucinations and sleep disturbance. The patient is not  nervous/anxious.     Immunization History  Administered Date(s) Administered  . Influenza Whole 08/21/2010, 05/21/2012  . Influenza, High Dose Seasonal PF 05/30/2017, 06/04/2019  . Influenza,inj,Quad PF,6+ Mos 05/23/2018  . Influenza-Unspecified 06/04/2014, 05/20/2015, 06/08/2016  . Pneumococcal Conjugate-13 08/22/1999  . Td 08/22/1999  . Zoster 08/21/2005   Pertinent  Health Maintenance Due  Topic Date Due  . DEXA SCAN  Never done  . PNA vac Low Risk Adult (2 of 2 - PPSV23) 08/21/2000  . INFLUENZA VACCINE  03/21/2020   Fall Risk  07/23/2019 01/15/2019 07/17/2018 06/04/2018 09/12/2017  Falls in the past year? 0 0 0 No No  Number falls in past yr: 0 0 0 - -  Injury with Fall? 0 0 0 - -   Functional Status Survey:    Vitals:   01/27/20 1315  BP: (!) 148/76  Pulse: 75  Resp: 16  Temp: 97.7 F (36.5 C)  SpO2: 95%  Weight: 102 lb (46.3 kg)  Height: '5\' 2"'  (1.575 m)   Body mass index is 18.66 kg/m. Physical Exam Vitals and nursing note reviewed.  Constitutional:      General: She is not in acute distress.    Appearance: Normal appearance. She is not ill-appearing, toxic-appearing  or diaphoretic.  HENT:     Head: Normocephalic and atraumatic.     Nose: Nose normal.     Mouth/Throat:     Mouth: Mucous membranes are moist.  Eyes:     Extraocular Movements: Extraocular movements intact.     Conjunctiva/sclera: Conjunctivae normal.     Pupils: Pupils are equal, round, and reactive to light.  Cardiovascular:     Rate and Rhythm: Tachycardia present. Rhythm irregular.     Heart sounds: No murmur.     Comments: Weak DP pulses.  Pulmonary:     Breath sounds: No wheezing, rhonchi or rales.  Abdominal:     General: Bowel sounds are normal. There is no distension.     Palpations: Abdomen is soft.     Tenderness: There is no abdominal tenderness. There is no right CVA tenderness, left CVA tenderness, guarding or rebound.  Musculoskeletal:     Cervical back: Normal range of  motion and neck supple.     Right lower leg: Edema present.     Left lower leg: Edema present.     Comments: 2-3+ edema BLE  Skin:    General: Skin is warm and dry.  Neurological:     General: No focal deficit present.     Mental Status: She is alert and oriented to person, place, and time. Mental status is at baseline.     Motor: No weakness.     Coordination: Coordination normal.     Gait: Gait abnormal.  Psychiatric:        Mood and Affect: Mood normal.        Behavior: Behavior normal.        Thought Content: Thought content normal.        Judgment: Judgment normal.     Labs reviewed: Recent Labs    01/24/20 0414 01/25/20 0428 01/26/20 0446  NA 133* 133* 130*  K 3.7 3.6 3.6  CL 103 103 101  CO2 22 21* 22  GLUCOSE 123* 118* 109*  BUN '17 15 15  ' CREATININE 0.70 0.61 0.66  CALCIUM 8.2* 8.0* 7.7*  MG 1.6* 2.1 1.8   Recent Labs    07/14/19 0820 01/15/20 0840 01/18/20 1302  AST 15 12 14*  ALT '8 7 10  ' ALKPHOS  --   --  47  BILITOT 0.5 0.7 1.0  PROT 6.1 5.9* 5.8*  ALBUMIN  --   --  3.0*   Recent Labs    01/24/20 0414 01/25/20 0428 01/26/20 0446  WBC 11.8* 12.6* 10.5  NEUTROABS 9.6* 10.6* 8.6*  HGB 11.1* 9.9* 10.0*  HCT 35.7* 31.4* 31.6*  MCV 88.1 87.7 86.8  PLT 332 279 255   Lab Results  Component Value Date   TSH 2.195 01/19/2020   No results found for: HGBA1C Lab Results  Component Value Date   CHOL 191 07/14/2019   HDL 65 07/14/2019   LDLCALC 110 (H) 07/14/2019   TRIG 71 07/14/2019   CHOLHDL 2.9 07/14/2019    Significant Diagnostic Results in last 30 days:  CT ABDOMEN PELVIS W CONTRAST  Result Date: 01/20/2020 CLINICAL DATA:  86 year old female with diarrhea. EXAM: CT ABDOMEN AND PELVIS WITH CONTRAST TECHNIQUE: Multidetector CT imaging of the abdomen and pelvis was performed using the standard protocol following bolus administration of intravenous contrast. CONTRAST:  166m OMNIPAQUE IOHEXOL 300 MG/ML  SOLN COMPARISON:  Abdominal radiograph  dated 01/19/2020 FINDINGS: Lower chest: Small bilateral pleural effusions with associated partial compressive atelectasis of the lower lobes. Pneumonia is  not excluded. Clinical correlation is recommended. There is mild cardiomegaly. Multi vessel coronary vascular calcification. No intra-abdominal free air. Diffuse mesenteric edema and small perihepatic ascites. Hepatobiliary: Minimal irregularity of the liver contour may represent early changes of cirrhosis. Clinical correlation is recommended. No intrahepatic biliary ductal dilatation. The gallbladder is unremarkable. Pancreas: Unremarkable. No pancreatic ductal dilatation or surrounding inflammatory changes. Spleen: Normal in size without focal abnormality. Adrenals/Urinary Tract: The adrenal glands are unremarkable. There is no hydronephrosis on either side. There is symmetric enhancement and excretion of contrast by both kidneys. There is a 2 cm left renal interpolar cyst and several subcentimeter hypodensities which are too small to characterize. The visualized ureters and urinary bladder appear unremarkable. Stomach/Bowel: There is scattered sigmoid diverticula. Diffuse thickening of the distal colon primarily involving the rectosigmoid most consistent with colitis. Clinical correlation is recommended. There is no bowel obstruction. The appendix is normal. Vascular/Lymphatic: Advanced aortoiliac atherosclerotic disease. There is a 3.5 cm partially thrombosed infrarenal abdominal aortic aneurysm. The IVC is unremarkable. No portal venous gas. There is no adenopathy. Reproductive: The uterus is grossly unremarkable. Other: Diffuse subcutaneous edema and anasarca. Musculoskeletal: Osteopenia with degenerative changes of the spine. No acute osseous pathology. 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 IMPRESSION: 1. Findings most likely represent colitis and less likely sigmoid diverticulitis. Clinical correlation is recommended. No bowel obstruction. Normal  appendix. 2. Small bilateral pleural effusions, small ascites and anasarca. 3. 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 4. A 3.5 cm partially thrombosed infrarenal abdominal aortic aneurysm. 5. Aortic Atherosclerosis (ICD10-I70.0). Electronically Signed   By: Anner Crete M.D.   On: 01/20/2020 20:29   DG Chest Port 1 View  Result Date: 01/19/2020 CLINICAL DATA:  A-fib, dehydration; EXAM: PORTABLE CHEST 1 VIEW COMPARISON:  Radiograph 11/03/2010 FINDINGS: Normal cardiac silhouette. Lungs are hyperinflated. No effusion, infiltrate pneumothorax. No acute osseous abnormality. IMPRESSION: Hyperinflated lungs.  No acute findings. Electronically Signed   By: Suzy Bouchard M.D.   On: 01/19/2020 09:06   DG Abd Portable 1V  Result Date: 01/19/2020 CLINICAL DATA:  Diarrhea. EXAM: PORTABLE ABDOMEN - 1 VIEW COMPARISON:  None. FINDINGS: The bowel gas pattern is normal. No radio-opaque calculi or other significant radiographic abnormality are seen. Extensive vascular calcification noted. Advanced lumbar spine degenerative changes and chronic avascular necrosis of left hip noted. IMPRESSION: Unremarkable bowel gas pattern. No acute findings. Electronically Signed   By: Marlaine Hind M.D.   On: 01/19/2020 12:12   ECHOCARDIOGRAM COMPLETE  Result Date: 01/22/2020    ECHOCARDIOGRAM REPORT   Patient Name:   [name] Date of Exam: 01/19/2020 Medical Rec #:  [id number]     Height:       62.0 in Accession #:    [id number]    Weight:       102.1 lb Date of Birth:  10-19-1922     BSA:          1.437 m Patient Age:    79 years      BP:           141/93 mmHg Patient Gender: F             HR:           117 bpm. Exam Location:  Inpatient Procedure: 2D Echo Indications:     atrial fibrillation 427.31  History:         Patient has no prior history of Echocardiogram examinations.  Risk Factors:Hypertension and Dyslipidemia.  Sonographer:     Johny Chess RDCS Referring Phys:  [id number] [place] Diagnosing Phys: Adrian Prows MD IMPRESSIONS  1. Patient in atrial fibrillation with RVR. Left ventricular ejection fraction, by estimation, is 55 to 60%. The left ventricle has normal function. The left ventricle has no regional wall motion abnormalities. Left ventricular diastolic parameters are indeterminate.  2. Right ventricular systolic function is normal. The right ventricular size is normal. There is moderately elevated pulmonary artery systolic pressure. The estimated right ventricular systolic pressure is 40.8 mmHg.  3. Left atrial size was moderately dilated.  4. Right atrial size was moderately dilated.  5. Mild restriction of the posterior MV leaflet. There is reverberation artificat in the LA from clacified MV apparatus. . The mitral valve is degenerative. Moderate mitral valve regurgitation. No evidence of mitral stenosis.  6. Tricuspid valve regurgitation is moderate.  7. The aortic valve is tricuspid. Aortic valve regurgitation is not visualized. Mild aortic valve sclerosis is present, with no evidence of aortic valve stenosis.  8. The inferior vena cava is dilated in size with >50% respiratory variability, suggesting right atrial pressure of 8 mmHg. FINDINGS  Left Ventricle: Patient in atrial fibrillation with RVR. Left ventricular ejection fraction, by estimation, is 55 to 60%. The left ventricle has normal function. The left ventricle has no regional wall motion abnormalities. The left ventricular internal  cavity size was normal in size. There is no left ventricular hypertrophy. Left ventricular diastolic parameters are indeterminate. Right Ventricle: The right ventricular size is normal. No increase in right ventricular wall thickness. Right ventricular systolic function is normal. There is moderately elevated pulmonary artery systolic pressure. The tricuspid regurgitant velocity is 3.08 m/s, and with an assumed right atrial pressure of 8 mmHg, the estimated right ventricular systolic pressure is 14.4 mmHg. Left  Atrium: Left atrial size was moderately dilated. Right Atrium: Right atrial size was moderately dilated. Pericardium: There is no evidence of pericardial effusion. Mitral Valve: Mild restriction of the posterior MV leaflet. There is reverberation artificat in the LA from clacified MV apparatus. The mitral valve is degenerative in appearance. There is mild thickening of the mitral valve leaflet(s). There is mild calcification of the mitral valve leaflet(s). Mild mitral annular calcification. Moderate mitral valve regurgitation. No evidence of mitral valve stenosis. Tricuspid Valve: The tricuspid valve is grossly normal. Tricuspid valve regurgitation is moderate. Aortic Valve: The aortic valve is tricuspid. Aortic valve regurgitation is not visualized. Mild aortic valve sclerosis is present, with no evidence of aortic valve stenosis. Pulmonic Valve: The pulmonic valve was grossly normal. Pulmonic valve regurgitation is trivial. Aorta: The aortic root is normal in size and structure. Venous: The inferior vena cava is dilated in size with greater than 50% respiratory variability, suggesting right atrial pressure of 8 mmHg. IAS/Shunts: No atrial level shunt detected by color flow Doppler.  LEFT VENTRICLE PLAX 2D LVIDd:         4.00 cm LVIDs:         3.00 cm LV PW:         1.20 cm LV IVS:        1.10 cm LVOT diam:     2.00 cm LVOT Area:     3.14 cm  IVC IVC diam: 2.20 cm LEFT ATRIUM             Index       RIGHT ATRIUM           Index LA  diam:        3.60 cm 2.51 cm/m  RA Area:     16.60 cm LA Vol (A2C):   67.0 ml 46.64 ml/m RA Volume:   39.20 ml  27.29 ml/m LA Vol (A4C):   56.0 ml 38.98 ml/m LA Biplane Vol: 66.9 ml 46.57 ml/m   AORTA Ao Root diam: 3.40 cm Ao Asc diam:  3.20 cm TRICUSPID VALVE TR Peak grad:   37.9 mmHg TR Vmax:        308.00 cm/s  SHUNTS Systemic Diam: 2.00 cm Adrian Prows MD Electronically signed by Adrian Prows MD Signature Date/Time: 01/22/2020/5:34:36 PM    Final      Assessment/Plan Edema Developing CHF vs peripheral edema may be related to Diltiazem(new upon dc), echo 01/19/20 EF 55-60%, underwent cardiology eval for Afib RVR, adding Furosemide 83m qd, increase Kcl 446m qd, update CBC, CMP. Hx of AR to amlodipine with swelling, may have to dc Diltiazem if no better.   Atrial fibrillation with rapid ventricular response (HCC) Heart rate is not controlled, 110-120bpm, added Diltiazem 12051mpon dc from hospital, SOB, hacking cough presented, continue Metoprolol, Eliquis, may consider CXR if no better or worse  Essential hypertension Blood pressure is controlled.   COPD (chronic obstructive pulmonary disease) Chronic cough, SOB, no O2 desaturation.   Diarrhea Persisted, negative C-diff in hospital, CT abd showed colitis vs diverticulitis, continue Flagyl, Imodium, update CBC, BMP  Hypokalemia Serum K 3.6 01/26/20, taking Kcl    Family/ staff Communication: plan of care reviewed with the patient and charge nurse.   Labs/tests ordered:  BMP/eGFR, pending CBC  Time spend 35 minutes.

## 2020-01-27 NOTE — Assessment & Plan Note (Signed)
Serum K 3.6 01/26/20, taking Kcl

## 2020-01-27 NOTE — Assessment & Plan Note (Signed)
Heart rate is not controlled, 110-120bpm, added Diltiazem 120mg  upon dc from hospital, SOB, hacking cough presented, continue Metoprolol, Eliquis, may consider CXR if no better or worse

## 2020-01-28 ENCOUNTER — Non-Acute Institutional Stay: Payer: Medicare Other | Admitting: Internal Medicine

## 2020-01-28 ENCOUNTER — Encounter: Payer: Self-pay | Admitting: Internal Medicine

## 2020-01-28 DIAGNOSIS — I10 Essential (primary) hypertension: Secondary | ICD-10-CM

## 2020-01-28 DIAGNOSIS — R197 Diarrhea, unspecified: Secondary | ICD-10-CM | POA: Diagnosis not present

## 2020-01-28 DIAGNOSIS — I4891 Unspecified atrial fibrillation: Secondary | ICD-10-CM

## 2020-01-28 DIAGNOSIS — R609 Edema, unspecified: Secondary | ICD-10-CM

## 2020-01-28 DIAGNOSIS — E785 Hyperlipidemia, unspecified: Secondary | ICD-10-CM

## 2020-01-28 NOTE — Progress Notes (Signed)
Provider:   Location:  [facility] Room Number: 28 Place of Service:  SNF (31)  PCP: [name], [name], NP Patient Care Team: [name], [name], NP as PCP - General (Internal Medicine) Terri Acosta, [facility] Terri Cedars, MD as Consulting Physician (Orthopedic Surgery) Terri Mutter, MD as Consulting Physician (Ophthalmology) Terri Levans, MD as Consulting Physician (Dermatology) Terri Prows, MD as Consulting Physician (Cardiology)  Extended Emergency Contact Information Primary Emergency Contact: Terri Acosta of [place] Phone: [phone] Mobile Phone: [phone] Relation: Daughter  Code Status:  Goals of Care: Advanced Directive information Advanced Directives 01/28/2020  Does Patient Have a Medical Advance Directive? Yes  Type of Advance Directive Living will;Healthcare Power of [place];Out of facility DNR (pink MOST or yellow form)  Does patient want to make changes to medical advance directive? No - Patient declined  Copy of [place] in Chart? Yes - validated most recent copy scanned in chart (See row information)  Would patient like information on creating a medical advance directive? -  Pre-existing out of facility DNR order (yellow form or pink MOST form) Yellow form placed in chart (order not valid for inpatient use)      Chief Complaint  Patient presents with   Readmit To SNF    New admission    HPI: Patient is a 86 y.o. female seen today for admission to SNF  Patient was admitted in the hospital from 5/30-6/7 for diarrhea diagnosed as colitis and A. fib with rapid ventricular response.  Patient has h/o Hypertension, Hyperlipidemia, Osteoarthritis, Unstable Gait Wheelchair Dependent,h/o Vaginal Atropy, Macular Degeneration  Patient has been was having diarrhea for few weeks but supposed to come for follow-up as outpatient in our clinic but before that became very weak and decided to go to the hospital.   Patient had CT scan done which showed colitis with early cirrhosis.  She was started on antibiotics. She also was diagnosed with A. fib with rapid ventricular response. Was started on Cardizem and Eliquis.  Per patient she had a history of palpitations and had seen cardiology many years ago and was told that she does not need any anticoagulation.  Patient states her diarrhea is much better.  Her stool studies were negative for any C. difficile.  She is tolerating Flagyl and Vantin. has poor appetite  Patient's heart rate seems little better controlled.  Is not having any more palpitations.  But her problem now is that she has gained 20 pounds.  She is complaining of shortness of breath lower extremity edema her hands are swollen to.  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 Norris MD   CATARACT EXTRACTION W/  INTRAOCULAR LENS IMPLANT Right 2010   CATARACT EXTRACTION W/ INTRAOCULAR LENS IMPLANT Left 10/2012   DILATION AND CURETTAGE OF UTERUS     EYE SURGERY Right 07/2009   cataract extraction/IOLI [name], MD   KNEE ARTHROSCOPY Right 2008     Torn meniscus, Dr. Veverly Fells    reports that she quit smoking about 41 years ago. She has never used smokeless tobacco. She reports current alcohol use. She reports that she does not use drugs. Social History   Socioeconomic History   Marital status: Widowed    Spouse name: Not on file   Number of children: Not on file   Years of education: Not on file   Highest education level: Not on file  Occupational History   Not on file  Tobacco Use   Smoking status: Former Smoker    Quit date: 02/11/1978    Years since quitting: 41.9   Smokeless tobacco: Never Used  Substance and Sexual Activity   Alcohol use: Yes   Drug use: No   Sexual activity: Not Currently  Other Topics Concern   Not on file  Social History Narrative   Lives alone in a apartment at [facility] in the independent living area since 2007   The patient is not exercising regularly, walking with walker   No specific diet.   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   She has a living will, POA   Former smoker, stopped 1979   Alcohol - one glass of wine at night   Social Determinants of Health   Financial Resource Strain:    Difficulty of Paying Living Expenses:   Food Insecurity:    Worried About Charity fundraiser in the Last Year:    Terri Acosta in the Last Year:   Transportation Needs:    Film/video editor (Medical):    Lack of Transportation (Non-Medical):   Physical Activity:    Days of Exercise per Week:    Minutes of Exercise per Session:   Stress:    Feeling of Stress :   Social Connections:    Frequency of Communication with Friends and Family:    Frequency of Social Gatherings with Friends and Family:    Attends Religious Services:    Active Member of Clubs or Organizations:    Attends Music therapist:    Marital Status:   Intimate Partner Violence:    Fear of Current or Ex-Partner:    Emotionally Abused:    Physically Abused:     Sexually Abused:     Functional Status Survey:    Family History  Problem Relation Age of Onset   Diabetes Brother    COPD Brother     Health Maintenance  Topic Date Due   COVID-19 Vaccine (1) Never done   DEXA SCAN  Never done   PNA vac Low Risk Adult (2 of 2 - PPSV23) 08/21/2000   TETANUS/TDAP  08/21/2009   INFLUENZA VACCINE  03/21/2020    Allergies  Allergen Reactions   Lomotil [Diphenoxylate] Nausea And Vomiting   Sulfa Antibiotics Other (See Comments)    Unknown    Amlodipine Swelling    Outpatient Encounter Medications as of 01/28/2020  Medication Sig   apixaban (ELIQUIS) 2.5 MG TABS tablet Take 1 tablet (2.5 mg total) by mouth 2 (two) times daily.   cefpodoxime (VANTIN) 200 MG tablet Take 1 tablet (200 mg total) by mouth 2 (two) times daily for 7 days.   diltiazem (  CARDIZEM CD) 120 MG 24 hr capsule Take 1 capsule (120 mg total) by mouth daily.   furosemide (LASIX) 20 MG tablet Take 20 mg by mouth daily.   loperamide (IMODIUM) 2 MG capsule Take 1 capsule (2 mg total) by mouth every 6 (six) hours as needed for diarrhea or loose stools.   metoprolol tartrate (LOPRESSOR) 50 MG tablet Take 2 tablets (100 mg total) by mouth 2 (two) times daily.   metroNIDAZOLE (FLAGYL) 500 MG tablet Take 1 tablet (500 mg total) by mouth 3 (three) times daily for 7 days.   potassium chloride SA (KLOR-CON) 20 MEQ tablet Take 40 mEq by mouth daily.   tuberculin (TUBERSOL) 5 UNIT/0.1ML injection Inject into the skin once. Once a day every 14 days   zinc oxide 20 % ointment Apply 1 application topically as needed for irritation.   [DISCONTINUED] potassium chloride SA (KLOR-CON) 20 MEQ tablet Take 1 tablet (20 mEq total) by mouth daily. (Patient taking differently: Take 40 mEq by mouth daily. )   No facility-administered encounter medications on file as of 01/28/2020.    Review of Systems  Constitutional: Positive for activity change and unexpected weight change.  HENT:  Negative.   Respiratory: Positive for cough and shortness of breath.   Cardiovascular: Positive for leg swelling.  Gastrointestinal: Positive for diarrhea.  Genitourinary: Negative.   Musculoskeletal: Positive for gait problem.  Skin: Negative.   Neurological: Positive for weakness.  Psychiatric/Behavioral: Positive for dysphoric mood.    Vitals:   01/28/20 1051  BP: (!) 153/70  Pulse: 77  Resp: 19  Temp: (!) 97.2 F (36.2 C)  SpO2: 94%  Weight: 126 lb 6.4 oz (57.3 kg)  Height: 5\' 2"  (1.575 m)   Body mass index is 23.12 kg/m. Physical Exam  Constitutional: Oriented to person, place, and time. Well-developed and well-nourished.  HENT:  Head: Normocephalic.  Mouth/Throat: Oropharynx is clear and moist.  Eyes: Pupils are equal, round, and reactive to light.  Neck: Neck supple.  Cardiovascular: Normal rate and normal heart sounds.  No murmur heard. Pulmonary/Chest: Effort normal and breath sounds normal. No respiratory distress. No wheezes. Had Rales Bilateral Abdominal: Soft. Bowel sounds are normal. No distension. There is no tenderness. There is no rebound.  Musculoskeletal: Mild Edema Bilateral in legs and her Hands  Lymphadenopathy: none Neurological: Alert and oriented to person, place, and time.  Skin: Skin is warm and dry.  Psychiatric: Normal mood and affect. Behavior is normal. Thought content normal.    Labs reviewed: Basic Metabolic Panel: Recent Labs    01/24/20 0414 01/25/20 0428 01/26/20 0446  NA 133* 133* 130*  K 3.7 3.6 3.6  CL 103 103 101  CO2 22 21* 22  GLUCOSE 123* 118* 109*  BUN 17 15 15   CREATININE 0.70 0.61 0.66  CALCIUM 8.2* 8.0* 7.7*  MG 1.6* 2.1 1.8   Liver Function Tests: Recent Labs    07/14/19 0820 01/15/20 0840 01/18/20 1302  AST 15 12 14*  ALT 8 7 10   ALKPHOS  --   --  47  BILITOT 0.5 0.7 1.0  PROT 6.1 5.9* 5.8*  ALBUMIN  --   --  3.0*   No results for input(s): LIPASE, AMYLASE in the last 8760 hours. No results for  input(s): AMMONIA in the last 8760 hours. CBC: Recent Labs    01/24/20 0414 01/25/20 0428 01/26/20 0446  WBC 11.8* 12.6* 10.5  NEUTROABS 9.6* 10.6* 8.6*  HGB 11.1* 9.9* 10.0*  HCT 35.7* 31.4* 31.6*  MCV 88.1 87.7 86.8  PLT 332 279 255   Cardiac Enzymes: No results for input(s): CKTOTAL, CKMB, CKMBINDEX, TROPONINI in the last 8760 hours. BNP: Invalid input(s): POCBNP No results found for: HGBA1C Lab Results  Component Value Date   TSH 2.195 01/19/2020   Lab Results  Component Value Date   JJOACZYS06 301 01/21/2020   Lab Results  Component Value Date   FOLATE 11.2 01/21/2020   Lab Results  Component Value Date   IRON 48 01/21/2020   TIBC 200 (L) 01/21/2020   FERRITIN 96 01/21/2020    Imaging and Procedures obtained prior to SNF admission: DG Chest Port 1 View  Result Date: 01/19/2020 CLINICAL DATA:  A-fib, dehydration; EXAM: PORTABLE CHEST 1 VIEW COMPARISON:  Radiograph 11/03/2010 FINDINGS: Normal cardiac silhouette. Lungs are hyperinflated. No effusion, infiltrate pneumothorax. No acute osseous abnormality. IMPRESSION: Hyperinflated lungs.  No acute findings. Electronically Signed   By: Suzy Bouchard M.D.   On: 01/19/2020 09:06   DG Abd Portable 1V  Result Date: 01/19/2020 CLINICAL DATA:  Diarrhea. EXAM: PORTABLE ABDOMEN - 1 VIEW COMPARISON:  None. FINDINGS: The bowel gas pattern is normal. No radio-opaque calculi or other significant radiographic abnormality are seen. Extensive vascular calcification noted. Advanced lumbar spine degenerative changes and chronic avascular necrosis of left hip noted. IMPRESSION: Unremarkable bowel gas pattern. No acute findings. Electronically Signed   By: Marlaine Hind M.D.   On: 01/19/2020 12:12   ECHOCARDIOGRAM COMPLETE  Result Date: 01/22/2020    ECHOCARDIOGRAM REPORT   Patient Name:   [name] Date of Exam: 01/19/2020 Medical Rec #:  [id number]     Height:       62.0 in Accession #:    [id number]    Weight:       102.1 lb Date  of Birth:  15-Jan-1923     BSA:          1.437 m Patient Age:    37 years      BP:           141/93 mmHg Patient Gender: F             HR:           117 bpm. Exam Location:  Inpatient Procedure: 2D Echo Indications:     atrial fibrillation 427.31  History:         Patient has no prior history of Echocardiogram examinations.                  Risk Factors:Hypertension and Dyslipidemia.  Sonographer:     Johny Chess RDCS Referring Phys:  [id number] [place] Diagnosing Phys: Terri Prows MD IMPRESSIONS  1. Patient in atrial fibrillation with RVR. Left ventricular ejection fraction, by estimation, is 55 to 60%. The left ventricle has normal function. The left ventricle has no regional wall motion abnormalities. Left ventricular diastolic parameters are indeterminate.  2. Right ventricular systolic function is normal. The right ventricular size is normal. There is moderately elevated pulmonary artery systolic pressure. The estimated right ventricular systolic pressure is 28.3 mmHg.  3. Left atrial size was moderately dilated.  4. Right atrial size was moderately dilated.  5. Mild restriction of the posterior MV leaflet. There is reverberation artificat in the LA from clacified MV apparatus. . The mitral valve is degenerative. Moderate mitral valve regurgitation. No evidence of mitral stenosis.  6. Tricuspid valve regurgitation is moderate.  7. The aortic valve is tricuspid. Aortic valve regurgitation is not visualized. Mild  aortic valve sclerosis is present, with no evidence of aortic valve stenosis.  8. The inferior vena cava is dilated in size with >50% respiratory variability, suggesting right atrial pressure of 8 mmHg. FINDINGS  Left Ventricle: Patient in atrial fibrillation with RVR. Left ventricular ejection fraction, by estimation, is 55 to 60%. The left ventricle has normal function. The left ventricle has no regional wall motion abnormalities. The left ventricular internal  cavity size was normal in  size. There is no left ventricular hypertrophy. Left ventricular diastolic parameters are indeterminate. Right Ventricle: The right ventricular size is normal. No increase in right ventricular wall thickness. Right ventricular systolic function is normal. There is moderately elevated pulmonary artery systolic pressure. The tricuspid regurgitant velocity is 3.08 m/s, and with an assumed right atrial pressure of 8 mmHg, the estimated right ventricular systolic pressure is 19.4 mmHg. Left Atrium: Left atrial size was moderately dilated. Right Atrium: Right atrial size was moderately dilated. Pericardium: There is no evidence of pericardial effusion. Mitral Valve: Mild restriction of the posterior MV leaflet. There is reverberation artificat in the LA from clacified MV apparatus. The mitral valve is degenerative in appearance. There is mild thickening of the mitral valve leaflet(s). There is mild calcification of the mitral valve leaflet(s). Mild mitral annular calcification. Moderate mitral valve regurgitation. No evidence of mitral valve stenosis. Tricuspid Valve: The tricuspid valve is grossly normal. Tricuspid valve regurgitation is moderate. Aortic Valve: The aortic valve is tricuspid. Aortic valve regurgitation is not visualized. Mild aortic valve sclerosis is present, with no evidence of aortic valve stenosis. Pulmonic Valve: The pulmonic valve was grossly normal. Pulmonic valve regurgitation is trivial. Aorta: The aortic root is normal in size and structure. Venous: The inferior vena cava is dilated in size with greater than 50% respiratory variability, suggesting right atrial pressure of 8 mmHg. IAS/Shunts: No atrial level shunt detected by color flow Doppler.  LEFT VENTRICLE PLAX 2D LVIDd:         4.00 cm LVIDs:         3.00 cm LV PW:         1.20 cm LV IVS:        1.10 cm LVOT diam:     2.00 cm LVOT Area:     3.14 cm  IVC IVC diam: 2.20 cm LEFT ATRIUM             Index       RIGHT ATRIUM           Index LA  diam:        3.60 cm 2.51 cm/m  RA Area:     16.60 cm LA Vol (A2C):   67.0 ml 46.64 ml/m RA Volume:   39.20 ml  27.29 ml/m LA Vol (A4C):   56.0 ml 38.98 ml/m LA Biplane Vol: 66.9 ml 46.57 ml/m   AORTA Ao Root diam: 3.40 cm Ao Asc diam:  3.20 cm TRICUSPID VALVE TR Peak grad:   37.9 mmHg TR Vmax:        308.00 cm/s  SHUNTS Systemic Diam: 2.00 cm Terri Prows MD Electronically signed by Terri Prows MD Signature Date/Time: 01/22/2020/5:34:36 PM    Final     Assessment/Plan Diastolic CHF with LE edema and SOB Discontinue Lasix Start on Demadex 20 mg  Continue Potassium Repeat BMP Daily Weights  Atrial fibrillation with rapid ventricular response ([place]) Doing well on Cardizem and Eliquis Follow up with Cardiology Essential hypertension Hyzaar Discontinued Controlled with Cardizem and toprol Diarrhea, unspecified type  Continue Flagyl and Vantin Stools much more formed General Decondition Working with therapy    Family/ staff Communication:   Labs/tests ordered:

## 2020-01-29 LAB — CBC
RBC: 4.57 (ref 3.87–5.11)
RBC: 4.57 (ref 3.87–5.11)

## 2020-01-29 LAB — BASIC METABOLIC PANEL
BUN: 12 (ref 4–21)
BUN: 12 (ref 4–21)
CO2: 19 (ref 13–22)
CO2: 19 (ref 13–22)
Chloride: 99 (ref 99–108)
Chloride: 99 (ref 99–108)
Creatinine: 0.6 (ref 0.5–1.1)
Creatinine: 0.6 (ref 0.5–1.1)
Glucose: 82
Glucose: 82
Potassium: 4.5 (ref 3.4–5.3)
Potassium: 4.5 (ref 3.4–5.3)
Sodium: 132 — AB (ref 137–147)
Sodium: 132 — AB (ref 137–147)

## 2020-01-29 LAB — CBC AND DIFFERENTIAL
HCT: 0 — AB (ref 36–46)
HCT: 38 (ref 36–46)
Hemoglobin: 12.6 (ref 12.0–16.0)
Hemoglobin: 12.6 (ref 12.0–16.0)
Platelets: 328 (ref 150–399)
Platelets: 328 (ref 150–399)
WBC: 14
WBC: 14

## 2020-01-29 LAB — COMPREHENSIVE METABOLIC PANEL
Calcium: 8.4 — AB (ref 8.7–10.7)
Calcium: 8.4 — AB (ref 8.7–10.7)
GFR calc Af Amer: 89
GFR calc non Af Amer: 77

## 2020-01-30 ENCOUNTER — Telehealth: Payer: Self-pay

## 2020-01-30 NOTE — Telephone Encounter (Signed)
Santiago Glad (daughter) called wanting to speak with you concerning her mother's care and condition. She may be reached at [phone].

## 2020-02-02 ENCOUNTER — Telehealth: Payer: Self-pay | Admitting: Cardiology

## 2020-02-02 ENCOUNTER — Encounter: Payer: Self-pay | Admitting: Internal Medicine

## 2020-02-02 ENCOUNTER — Other Ambulatory Visit: Payer: Self-pay | Admitting: Internal Medicine

## 2020-02-02 ENCOUNTER — Non-Acute Institutional Stay (SKILLED_NURSING_FACILITY): Payer: Medicare Other | Admitting: Internal Medicine

## 2020-02-02 DIAGNOSIS — I4891 Unspecified atrial fibrillation: Secondary | ICD-10-CM

## 2020-02-02 DIAGNOSIS — R5381 Other malaise: Secondary | ICD-10-CM

## 2020-02-02 DIAGNOSIS — E785 Hyperlipidemia, unspecified: Secondary | ICD-10-CM | POA: Diagnosis not present

## 2020-02-02 DIAGNOSIS — F339 Major depressive disorder, recurrent, unspecified: Secondary | ICD-10-CM

## 2020-02-02 DIAGNOSIS — K7460 Unspecified cirrhosis of liver: Secondary | ICD-10-CM

## 2020-02-02 DIAGNOSIS — R197 Diarrhea, unspecified: Secondary | ICD-10-CM

## 2020-02-02 MED ORDER — OXYCODONE HCL 5 MG PO TABS: 5.0000 mg | ORAL_TABLET | Freq: Two times a day (BID) | ORAL | 0 refills | Status: DC

## 2020-02-02 NOTE — Progress Notes (Addendum)
Location:   [place] Room Number: 28 Place of Service:  SNF [id number]) Provider:  Virgie Dad., MD  [name], [name], NP  Patient Care Team: [name], [name], NP as PCP - General (Internal Medicine) Melina Modena, [facility] Netta Cedars, MD as Consulting Physician (Orthopedic Surgery) Luberta Mutter, MD as Consulting Physician (Ophthalmology) Sydnee Levans, MD as Consulting Physician (Dermatology) Adrian Prows, MD as Consulting Physician (Cardiology)  Extended Emergency Contact Information Primary Emergency Contact: Wilhemena Durie [place] of [place] Phone: [phone] Mobile Phone: [phone] Relation: Daughter  Code Status:  DNR Goals of care: Advanced Directive information Advanced Directives 02/02/2020  Does Patient Have a Medical Advance Directive? Yes  Type of Paramedic of [place];Living will;Out of facility DNR (pink MOST or yellow form)  Does patient want to make changes to medical advance directive? No - Patient declined  Copy of [place] in Chart? Yes - validated most recent copy scanned in chart (See row information)  Would patient like information on creating a medical advance directive? -  Pre-existing out of facility DNR order (yellow form or pink MOST form) Yellow form placed in chart (order not valid for inpatient use)     Chief Complaint  Patient presents with  . Acute Visit    Follow up visit    HPI:  Pt is a 86 y.o. female seen today for an acute visit for Follow up and did discuss with the daughter about the discharge planning  Patient was admitted in the hospital from 5/30-6/7 for diarrhea diagnosed as colitis and A. fib with rapid ventricular response.  Patient has h/o Hypertension, Hyperlipidemia, Osteoarthritis, Unstable Gait Wheelchair Dependent,h/o Vaginal Atropy, Macular Degeneration  Patient has been was having diarrhea for few weeks but supposed to come for follow-up as  outpatient in our clinic but before that became very weak and decided to go to the hospital.  Patient had CT scan done which showed colitis with early cirrhosis.  She was started on antibiotics. She also was diagnosed with A. fib with rapid ventricular response. Was started on Cardizem and Eliquis.  Per patient she had a history of palpitations and had seen cardiology many years ago and was told that she does not need any anticoagulation.  Patient continues to complain of diarrhea. No abdominal pain fever but continues to have poor appetite Has lower extremity edema.  With almost leaking in one leg.  She has lost almost 15 pounds on Demadex.  But she still 10 pounds over her baseline weight. Patient continues to be very upset because she is still dependent for her ADLs transfers not able to do much with therapy.  She is really concerned that she would not be able to go back to her apartment  Past Medical History:  Diagnosis Date  . Abnormal uterine bleeding   . Actinic keratosis 02/13/2012  . Acute bronchospasm 10/31/2011  . Cancer ([place]) 04/2008   Skin cancer of low back [name], MD  . Cataract   . Disorder of bone and cartilage, unspecified 02/18/2007  . Dizziness and giddiness 08/30/2010  . Dysmenorrhea   . Edema 12/19/2011  . Endometriosis   . 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 Osteoarthrosis, unspecified whether generalized or localized, unspecified site 08/20/2006  . Osteoporosis   . Other abnormal blood chemistry 03/14/2011  . Other and unspecified hyperlipidemia 10/18/2010  . Palpitations 02/13/2012  . Postmenopausal bleeding 08/30/2010  . Shortness of breath 11/28/2011  . Supraventricular premature beats  05/23/2011  . Unspecified essential hypertension 08/20/2006   Past Surgical History:  Procedure Laterality Date  . CARPAL TUNNEL RELEASE  2004   S. Norris MD  . CATARACT EXTRACTION W/ INTRAOCULAR LENS IMPLANT Right 2010  . CATARACT EXTRACTION W/ INTRAOCULAR LENS  IMPLANT Left 10/2012  . DILATION AND CURETTAGE OF UTERUS    . EYE SURGERY Right 07/2009   cataract extraction/IOLI [name], MD  . KNEE ARTHROSCOPY Right 2008   Torn meniscus, Dr. Veverly Fells    Allergies  Allergen Reactions  . Lomotil [Diphenoxylate] Nausea And Vomiting  . Sulfa Antibiotics Other (See Comments)    Unknown   . Amlodipine Swelling    Allergies as of 02/02/2020      Reactions   Lomotil [diphenoxylate] Nausea And Vomiting   Sulfa Antibiotics Other (See Comments)   Unknown    Amlodipine Swelling      Medication List       Accurate as of February 02, 2020  2:52 PM. If you have any questions, ask your nurse or doctor.        STOP taking these medications   furosemide 20 MG tablet Commonly known as: LASIX Stopped by: Virgie Dad, MD     TAKE these medications   apixaban 2.5 MG Tabs tablet Commonly known as: ELIQUIS Take 1 tablet (2.5 mg total) by mouth 2 (two) times daily.   cefpodoxime 200 MG tablet Commonly known as: VANTIN Take 200 mg by mouth 2 (two) times daily.   diltiazem 120 MG 24 hr capsule Commonly known as: CARDIZEM CD Take 1 capsule (120 mg total) by mouth daily.   loperamide 2 MG capsule Commonly known as: IMODIUM Take 1 capsule (2 mg total) by mouth every 6 (six) hours as needed for diarrhea or loose stools.   metoprolol tartrate 50 MG tablet Commonly known as: LOPRESSOR Take 2 tablets (100 mg total) by mouth 2 (two) times daily.   metroNIDAZOLE 500 MG tablet Commonly known as: FLAGYL Take 500 mg by mouth 3 (three) times daily.   multivitamin-lutein Caps capsule Take 1 capsule by mouth daily.   potassium chloride SA 20 MEQ tablet Commonly known as: KLOR-CON Take 40 mEq by mouth daily.   torsemide 20 MG tablet Commonly known as: DEMADEX Take 20 mg by mouth daily.   Tubersol 5 UNIT/0.1ML injection Generic drug: tuberculin Inject into the skin once. Once a day every 14 days   zinc oxide 20 % ointment Apply 1 application  topically as needed for irritation.       Review of Systems  Constitutional: Positive for activity change, appetite change and unexpected weight change.  HENT: Negative.   Respiratory: Positive for shortness of breath.   Cardiovascular: Positive for leg swelling.  Gastrointestinal: Positive for diarrhea.  Genitourinary: Positive for frequency.  Musculoskeletal: Positive for gait problem.  Neurological: Positive for weakness.  Psychiatric/Behavioral: Positive for dysphoric mood.  All other systems reviewed and are negative.   Immunization History  Administered Date(s) Administered  . Influenza Whole 08/21/2010, 05/21/2012  . Influenza, High Dose Seasonal PF 05/30/2017, 06/04/2019  . Influenza,inj,Quad PF,6+ Mos 05/23/2018  . Influenza-Unspecified 06/04/2014, 05/20/2015, 06/08/2016  . Pneumococcal Conjugate-13 08/22/1999  . Td 08/22/1999  . Zoster 08/21/2005   Pertinent  Health Maintenance Due  Topic Date Due  . DEXA SCAN  Never done  . PNA vac Low Risk Adult (2 of 2 - PPSV23) 08/21/2000  . INFLUENZA VACCINE  03/21/2020   Fall Risk  07/23/2019 01/15/2019 07/17/2018 06/04/2018 09/12/2017  Falls  in the past year? 0 0 0 No No  Number falls in past yr: 0 0 0 - -  Injury with Fall? 0 0 0 - -   Functional Status Survey:    Vitals:   02/02/20 1430  BP: 132/84  Pulse: (!) 20  Resp: 19  Temp: (!) 97.3 F (36.3 C)  SpO2: 93%  Weight: 110 lb 8 oz (50.1 kg)  Height: 5\' 2"  (1.575 m)   Body mass index is 20.21 kg/m. Physical Exam Constitutional: Oriented to person, place, and time. Well-developed and well-nourished.  HENT:  Head: Normocephalic.  Mouth/Throat: Oropharynx is clear and moist.  Eyes: Pupils are equal, round, and reactive to light.  Neck: Neck supple.  Cardiovascular: Normal rate and normal heart sounds.  No murmur heard. Pulmonary/Chest: Effort normal and breath sounds normal. No respiratory distress. No wheezes. She has no rales.  Abdominal: Soft. Bowel  sounds are normal. No distension. There is no tenderness. There is no rebound.  Musculoskeletal: Moderate edema bilateral Lymphadenopathy: none Neurological: Alert and oriented to person, place, and time.  No focal deficits and had good strength in all extremities Skin: Skin is warm and dry.  Psychiatric: Patient very anxious and depressed Labs reviewed: Recent Labs    01/24/20 0414 01/25/20 0428 01/26/20 0446  NA 133* 133* 130*  K 3.7 3.6 3.6  CL 103 103 101  CO2 22 21* 22  GLUCOSE 123* 118* 109*  BUN 17 15 15   CREATININE 0.70 0.61 0.66  CALCIUM 8.2* 8.0* 7.7*  MG 1.6* 2.1 1.8   Recent Labs    07/14/19 0820 01/15/20 0840 01/18/20 1302  AST 15 12 14*  ALT 8 7 10   ALKPHOS  --   --  47  BILITOT 0.5 0.7 1.0  PROT 6.1 5.9* 5.8*  ALBUMIN  --   --  3.0*   Recent Labs    01/24/20 0414 01/25/20 0428 01/26/20 0446  WBC 11.8* 12.6* 10.5  NEUTROABS 9.6* 10.6* 8.6*  HGB 11.1* 9.9* 10.0*  HCT 35.7* 31.4* 31.6*  MCV 88.1 87.7 86.8  PLT 332 279 255   Lab Results  Component Value Date   TSH 2.195 01/19/2020   No results found for: HGBA1C Lab Results  Component Value Date   CHOL 191 07/14/2019   HDL 65 07/14/2019   LDLCALC 110 (H) 07/14/2019   TRIG 71 07/14/2019   CHOLHDL 2.9 07/14/2019    Significant Diagnostic Results in last 30 days:  CT ABDOMEN PELVIS W CONTRAST  Result Date: 01/20/2020 CLINICAL DATA:  86 year old female with diarrhea. EXAM: CT ABDOMEN AND PELVIS WITH CONTRAST TECHNIQUE: Multidetector CT imaging of the abdomen and pelvis was performed using the standard protocol following bolus administration of intravenous contrast. CONTRAST:  182mL OMNIPAQUE IOHEXOL 300 MG/ML  SOLN COMPARISON:  Abdominal radiograph dated 01/19/2020 FINDINGS: Lower chest: Small bilateral pleural effusions with associated partial compressive atelectasis of the lower lobes. Pneumonia is not excluded. Clinical correlation is recommended. There is mild cardiomegaly. Multi vessel  coronary vascular calcification. No intra-abdominal free air. Diffuse mesenteric edema and small perihepatic ascites. Hepatobiliary: Minimal irregularity of the liver contour may represent early changes of cirrhosis. Clinical correlation is recommended. No intrahepatic biliary ductal dilatation. The gallbladder is unremarkable. Pancreas: Unremarkable. No pancreatic ductal dilatation or surrounding inflammatory changes. Spleen: Normal in size without focal abnormality. Adrenals/Urinary Tract: The adrenal glands are unremarkable. There is no hydronephrosis on either side. There is symmetric enhancement and excretion of contrast by both kidneys. There is a 2 cm  left renal interpolar cyst and several subcentimeter hypodensities which are too small to characterize. The visualized ureters and urinary bladder appear unremarkable. Stomach/Bowel: There is scattered sigmoid diverticula. Diffuse thickening of the distal colon primarily involving the rectosigmoid most consistent with colitis. Clinical correlation is recommended. There is no bowel obstruction. The appendix is normal. Vascular/Lymphatic: Advanced aortoiliac atherosclerotic disease. There is a 3.5 cm partially thrombosed infrarenal abdominal aortic aneurysm. The IVC is unremarkable. No portal venous gas. There is no adenopathy. Reproductive: The uterus is grossly unremarkable. Other: Diffuse subcutaneous edema and anasarca. Musculoskeletal: Osteopenia with degenerative changes of the spine. No acute osseous pathology. 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 IMPRESSION: 1. Findings most likely represent colitis and less likely sigmoid diverticulitis. Clinical correlation is recommended. No bowel obstruction. Normal appendix. 2. Small bilateral pleural effusions, small ascites and anasarca. 3. 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 4. A 3.5 cm partially thrombosed infrarenal abdominal aortic aneurysm. 5. Aortic Atherosclerosis (ICD10-I70.0). Electronically Signed   By: Anner Crete M.D.   On: 01/20/2020 20:29   DG Chest Port 1 View  Result Date: 01/19/2020 CLINICAL DATA:  A-fib, dehydration; EXAM: PORTABLE CHEST 1 VIEW COMPARISON:  Radiograph 11/03/2010 FINDINGS: Normal cardiac silhouette. Lungs are hyperinflated. No effusion, infiltrate pneumothorax. No acute osseous abnormality. IMPRESSION: Hyperinflated lungs.  No acute findings. Electronically Signed   By: Suzy Bouchard M.D.   On: 01/19/2020 09:06   DG Abd Portable 1V  Result Date: 01/19/2020 CLINICAL DATA:  Diarrhea. EXAM: PORTABLE ABDOMEN - 1 VIEW COMPARISON:  None. FINDINGS: The bowel gas pattern is normal. No radio-opaque calculi or other significant radiographic abnormality are seen. Extensive vascular calcification noted. Advanced lumbar spine degenerative changes and chronic avascular necrosis of left hip noted. IMPRESSION: Unremarkable bowel gas pattern. No acute findings. Electronically Signed   By: Marlaine Hind M.D.   On: 01/19/2020 12:12   ECHOCARDIOGRAM COMPLETE  Result Date: 01/22/2020    ECHOCARDIOGRAM REPORT   Patient Name:   Terri Acosta Date of Exam: 01/19/2020 Medical Rec #:  [id number]     Height:       62.0 in Accession #:    [id number]    Weight:       102.1 lb Date of Birth:  [birth year]     BSA:          1.437 m Patient Age:    32 years      BP:           141/93 mmHg Patient Gender: F             HR:           117 bpm. Exam Location:  Inpatient Procedure: 2D Echo Indications:     atrial fibrillation 427.31  History:         Patient has no prior history of Echocardiogram examinations.                  Risk Factors:Hypertension and Dyslipidemia.  Sonographer:     Johny Chess RDCS Referring Phys:  [id number] [facility] Diagnosing Phys: Adrian Prows MD IMPRESSIONS  1. Patient in atrial fibrillation with RVR. Left ventricular ejection fraction, by estimation, is 55 to 60%. The left ventricle has normal function. The left ventricle has no regional wall motion abnormalities. Left ventricular  diastolic parameters are indeterminate.  2. Right ventricular systolic function is normal. The right ventricular size is normal. There is moderately elevated pulmonary artery systolic pressure. The estimated right ventricular systolic pressure is 89.2 mmHg.  3. Left atrial size was moderately dilated.  4. Right atrial size was moderately dilated.  5. Mild restriction of the posterior MV leaflet. There is reverberation artificat in the LA from clacified MV apparatus. . The mitral valve is degenerative. Moderate mitral valve regurgitation. No evidence of mitral stenosis.  6. Tricuspid valve regurgitation is moderate.  7. The aortic valve is tricuspid. Aortic valve regurgitation is not visualized. Mild aortic valve sclerosis is present, with no evidence of aortic valve stenosis.  8. The inferior vena cava is dilated in size with >50% respiratory variability, suggesting right atrial pressure of 8 mmHg. FINDINGS  Left Ventricle: Patient in atrial fibrillation with RVR. Left ventricular ejection fraction, by estimation, is 55 to 60%. The left ventricle has normal function. The left ventricle has no regional wall motion abnormalities. The left ventricular internal  cavity size was normal in size. There is no left ventricular hypertrophy. Left ventricular diastolic parameters are indeterminate. Right Ventricle: The right ventricular size is normal. No increase in right ventricular wall thickness. Right ventricular systolic function is normal. There is moderately elevated pulmonary artery systolic pressure. The tricuspid regurgitant velocity is 3.08 m/s, and with an assumed right atrial pressure of 8 mmHg, the estimated right ventricular systolic pressure is 51.8 mmHg. Left Atrium: Left atrial size was moderately dilated. Right Atrium: Right atrial size was moderately dilated. Pericardium: There is no evidence of pericardial effusion. Mitral Valve: Mild restriction of the posterior MV leaflet. There is reverberation  artificat in the LA from clacified MV apparatus. The mitral valve is degenerative in appearance. There is mild thickening of the mitral valve leaflet(s). There is mild calcification of the mitral valve leaflet(s). Mild mitral annular calcification. Moderate mitral valve regurgitation. No evidence of mitral valve stenosis. Tricuspid Valve: The tricuspid valve is grossly normal. Tricuspid valve regurgitation is moderate. Aortic Valve: The aortic valve is tricuspid. Aortic valve regurgitation is not visualized. Mild aortic valve sclerosis is present, with no evidence of aortic valve stenosis. Pulmonic Valve: The pulmonic valve was grossly normal. Pulmonic valve regurgitation is trivial. Aorta: The aortic root is normal in size and structure. Venous: The inferior vena cava is dilated in size with greater than 50% respiratory variability, suggesting right atrial pressure of 8 mmHg. IAS/Shunts: No atrial level shunt detected by color flow Doppler.  LEFT VENTRICLE PLAX 2D LVIDd:         4.00 cm LVIDs:         3.00 cm LV PW:         1.20 cm LV IVS:        1.10 cm LVOT diam:     2.00 cm LVOT Area:     3.14 cm  IVC IVC diam: 2.20 cm LEFT ATRIUM             Index       RIGHT ATRIUM           Index LA diam:        3.60 cm 2.51 cm/m  RA Area:     16.60 cm LA Vol (A2C):   67.0 ml 46.64 ml/m RA Volume:   39.20 ml  27.29 ml/m LA Vol (A4C):   56.0 ml 38.98 ml/m LA Biplane Vol: 66.9 ml 46.57 ml/m   AORTA Ao Root diam: 3.40 cm Ao Asc diam:  3.20 cm TRICUSPID VALVE TR Peak grad:   37.9 mmHg TR Vmax:        308.00 cm/s  SHUNTS Systemic Diam: 2.00 cm Adrian Prows MD  Electronically signed by Adrian Prows MD Signature Date/Time: 01/22/2020/5:34:36 PM    Final     Assessment/Plan  Atrial fibrillation with rapid ventricular response ([place]) Rate controlled on Cardizem and Eliquis Has follow-up with cardiology  Diarrhea, unspecified type Continues to have episodes of diarrhea Today is her last day On Flagyl and Vantin Will repeat  dose for C. difficile again Using Imodium as needed which is helping If no improvement will need GI consult   Physical deconditioning Discussed with the therapy They think she is making adequate progress and possibly able to go to assisted living Cirrhosis of liver without ascites, unspecified hepatic cirrhosis type ([place]) This was seen in CT scan I discussed this with the daughter patient does have history of using alcohol every day.  Per daughter she was  Alcoholic when she was at home before moving to friend's home Will need follow-up.  Depression, recurrent ([place]) Also discussed with the daughter about her Mood and Depression Patient is also complaining of poor appetite At this time she is refusing for antidepressant We will talk to her again   Family/ staff Communication:   Labs/tests ordered:  Pearletha Furl for C Diff, CBC,CMP  Total time spent in this patient care encounter was  45_  minutes; greater than 50% of the visit spent counseling patient and staff, reviewing records , Labs and coordinating care for problems addressed at this encounter.

## 2020-02-02 NOTE — Telephone Encounter (Signed)
D/W her in the facility

## 2020-02-02 NOTE — Telephone Encounter (Signed)
Patient has been called 3 times:  01/26/20 9:40am LVM to schedule hosp f/u per staff message - lcn     01/28/20 8:59am called to schedule hosp f/u per staff message but never went to [facility]     01/30/20 8:09am called to schedule hosp f/u with Dr. Gardiner Rhyme in 2 weeks, patient also has a referral in the system. No VM - lcn

## 2020-02-02 NOTE — Telephone Encounter (Signed)
-----   Message from Charlie Pitter, [place] sent at 01/25/2020  9:52 AM EDT ----- Regarding: Needs f/u Please schedule this patient for a follow-up appointment and call them with that information.  Primary Cardiologist: Gardiner Rhyme Date of Discharge: 01/25/2020 Appointment Needed Within: 2 weeks Appointment Type: post hospital  Thank you! Dayna Dunn PA-C

## 2020-02-04 ENCOUNTER — Encounter: Payer: Self-pay | Admitting: Internal Medicine

## 2020-02-04 ENCOUNTER — Non-Acute Institutional Stay (SKILLED_NURSING_FACILITY): Payer: Medicare Other | Admitting: Internal Medicine

## 2020-02-04 DIAGNOSIS — E785 Hyperlipidemia, unspecified: Secondary | ICD-10-CM

## 2020-02-04 DIAGNOSIS — K7460 Unspecified cirrhosis of liver: Secondary | ICD-10-CM

## 2020-02-04 DIAGNOSIS — F339 Major depressive disorder, recurrent, unspecified: Secondary | ICD-10-CM

## 2020-02-04 DIAGNOSIS — R609 Edema, unspecified: Secondary | ICD-10-CM

## 2020-02-04 DIAGNOSIS — I4891 Unspecified atrial fibrillation: Secondary | ICD-10-CM | POA: Diagnosis not present

## 2020-02-04 DIAGNOSIS — R197 Diarrhea, unspecified: Secondary | ICD-10-CM | POA: Diagnosis not present

## 2020-02-04 DIAGNOSIS — R5381 Other malaise: Secondary | ICD-10-CM | POA: Diagnosis not present

## 2020-02-04 NOTE — Progress Notes (Signed)
Location:    [facility] Room Number: 28 Place of Service:  SNF [phone]) Provider:  Veleta Miners MD   [name], [name], NP  Patient Care Team: [name], [name], NP as PCP - General (Internal Medicine) Melina Modena, [facility] Netta Cedars, MD as Consulting Physician (Orthopedic Surgery) Luberta Mutter, MD as Consulting Physician (Ophthalmology) Sydnee Levans, MD as Consulting Physician (Dermatology) Adrian Prows, MD as Consulting Physician (Cardiology)  Extended Emergency Contact Information Primary Emergency Contact: [place] of [place] Phone: [phone] Mobile Phone: [phone] Relation: Daughter  Code Status:  DNR Goals of care: Advanced Directive information Advanced Directives 02/02/2020  Does Patient Have a Medical Advance Directive? Yes  Type of Paramedic of [place];Living will;Out of facility DNR (pink MOST or yellow form)  Does patient want to make changes to medical advance directive? No - Patient declined  Copy of [place] in Chart? Yes - validated most recent copy scanned in chart (See row information)  Would patient like information on creating a medical advance directive? -  Pre-existing out of facility DNR order (yellow form or pink MOST form) Yellow form placed in chart (order not valid for inpatient use)     Chief Complaint  Patient presents with  . Acute Visit    Follow up and Depression     HPI:  Pt is a 86 y.o. female seen today for an acute visit for follow-up of her diarrhea, lower extremity edema and depression Patient was admitted in the hospital from 5/30-6/7 for diarrhea diagnosed as colitis and A. fib with rapid ventricular response.  Patient has h/o Hypertension, Hyperlipidemia, Osteoarthritis, Unstable Gait Wheelchair Dependent,h/o Vaginal Atropy, Macular Degeneration  Patient has been was having diarrhea for few weeks but supposed to come for follow-up as  outpatient in our clinic but before that became very weak and decided to go to the hospital. Patient had CT scan done which showed colitis with early cirrhosis. She was started on antibiotics. She also was diagnosed with A. fib with rapid ventricular response. Was started on Cardizem and Eliquis.   Patient states her diarrhea is better today with Imodium.  C. difficile is pending.  Continues to have poor appetite. Has lost weight since been on torsemide.  But continues to have lower extremity edema.  Continues to have leaking in both legs. Patient also is agreeable for antidepressant. Is doing better with therapy.  But still continues to be very weak for transfer.   Past Medical History:  Diagnosis Date  . Abnormal uterine bleeding   . Actinic keratosis 02/13/2012  . Acute bronchospasm 10/31/2011  . Cancer ([place]) 04/2008   Skin cancer of low back [name], MD  . Cataract   . Disorder of bone and cartilage, unspecified 02/18/2007  . Dizziness and giddiness 08/30/2010  . Dysmenorrhea   . Edema 12/19/2011  . Endometriosis   . 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 Osteoarthrosis, unspecified whether generalized or localized, unspecified site 08/20/2006  . Osteoporosis   . Other abnormal blood chemistry 03/14/2011  . Other and unspecified hyperlipidemia 10/18/2010  . Palpitations 02/13/2012  . Postmenopausal bleeding 08/30/2010  . Shortness of breath 11/28/2011  . Supraventricular premature beats 05/23/2011  . Unspecified essential hypertension 08/20/2006   Past Surgical History:  Procedure Laterality Date  . CARPAL TUNNEL RELEASE  2004   S. Norris MD  . CATARACT EXTRACTION W/ INTRAOCULAR LENS IMPLANT Right 2010  . CATARACT EXTRACTION W/ INTRAOCULAR LENS IMPLANT Left 10/2012  .  DILATION AND CURETTAGE OF UTERUS    . EYE SURGERY Right 07/2009   cataract extraction/IOLI [name], MD  . KNEE ARTHROSCOPY Right 2008   Torn meniscus, Dr. Veverly Fells    Allergies  Allergen Reactions   . Lomotil [Diphenoxylate] Nausea And Vomiting  . Sulfa Antibiotics Other (See Comments)    Unknown   . Amlodipine Swelling    Allergies as of 02/04/2020      Reactions   Lomotil [diphenoxylate] Nausea And Vomiting   Sulfa Antibiotics Other (See Comments)   Unknown    Amlodipine Swelling      Medication List       Accurate as of February 04, 2020 10:13 AM. If you have any questions, ask your nurse or doctor.        STOP taking these medications   cefpodoxime 200 MG tablet Commonly known as: VANTIN Stopped by: Virgie Dad, MD   metroNIDAZOLE 500 MG tablet Commonly known as: FLAGYL Stopped by: Virgie Dad, MD     TAKE these medications   apixaban 2.5 MG Tabs tablet Commonly known as: ELIQUIS Take 1 tablet (2.5 mg total) by mouth 2 (two) times daily.   diltiazem 120 MG 24 hr capsule Commonly known as: CARDIZEM CD Take 1 capsule (120 mg total) by mouth daily.   loperamide 2 MG capsule Commonly known as: IMODIUM Take 1 capsule (2 mg total) by mouth every 6 (six) hours as needed for diarrhea or loose stools.   metoprolol tartrate 50 MG tablet Commonly known as: LOPRESSOR Take 2 tablets (100 mg total) by mouth 2 (two) times daily.   multivitamin-lutein Caps capsule Take 1 capsule by mouth daily.   potassium chloride SA 20 MEQ tablet Commonly known as: KLOR-CON Take 40 mEq by mouth daily.   saccharomyces boulardii 250 MG capsule Commonly known as: FLORASTOR Take 250 mg by mouth 2 (two) times daily.   torsemide 20 MG tablet Commonly known as: DEMADEX Take 20 mg by mouth daily.   Tubersol 5 UNIT/0.1ML injection Generic drug: tuberculin Inject into the skin once. Once a day every 14 days   zinc oxide 20 % ointment Apply 1 application topically as needed for irritation.       Review of Systems  Constitutional: Positive for activity change, appetite change and unexpected weight change.  HENT: Negative.   Respiratory: Negative.   Cardiovascular:  Positive for leg swelling.  Gastrointestinal: Negative.   Genitourinary: Positive for frequency and urgency.  Musculoskeletal: Positive for gait problem.  Neurological: Positive for weakness.  Psychiatric/Behavioral: Positive for dysphoric mood.    Immunization History  Administered Date(s) Administered  . Influenza Whole 08/21/2010, 05/21/2012  . Influenza, High Dose Seasonal PF 05/30/2017, 06/04/2019  . Influenza,inj,Quad PF,6+ Mos 05/23/2018  . Influenza-Unspecified 06/04/2014, 05/20/2015, 06/08/2016  . Pneumococcal Conjugate-13 08/22/1999  . Td 08/22/1999  . Zoster 08/21/2005   Pertinent  Health Maintenance Due  Topic Date Due  . DEXA SCAN  Never done  . PNA vac Low Risk Adult (2 of 2 - PPSV23) 08/21/2000  . INFLUENZA VACCINE  03/21/2020   Fall Risk  07/23/2019 01/15/2019 07/17/2018 06/04/2018 09/12/2017  Falls in the past year? 0 0 0 No No  Number falls in past yr: 0 0 0 - -  Injury with Fall? 0 0 0 - -   Functional Status Survey:    Vitals:   02/04/20 1010  BP: 99/66  Pulse: 67  Resp: 19  Temp: (!) 97.1 F (36.2 C)  SpO2: 99%  Weight:  107 lb 8 oz (48.8 kg)  Height: 5\' 2"  (1.575 m)   Body mass index is 19.66 kg/m. Physical Exam  Constitutional: Oriented to person, place, and time. Well-developed and well-nourished.  HENT:  Head: Normocephalic.  Mouth/Throat: Oropharynx is clear and moist.  Eyes: Pupils are equal, round, and reactive to light.  Neck: Neck supple.  Cardiovascular: Normal rate and normal heart sounds.  Irregular Rythm No murmur heard. Pulmonary/Chest: Effort normal and breath sounds normal. No respiratory distress. No wheezes. She has no rales.  Abdominal: Soft. Bowel sounds are normal. No distension. There is no tenderness. There is no rebound.  Musculoskeletal: Continuous Moderate Edema Bilateral Lymphadenopathy: none Neurological: Alert and oriented to person, place, and time.  Skin: Skin is warm and dry.  Psychiatric: Normal mood and  affect. Behavior is normal. Thought content normal.    Labs reviewed: Recent Labs    01/24/20 0414 01/24/20 0414 01/25/20 0428 01/26/20 0446 01/29/20 0000  NA 133*   < > 133* 130* 132*  K 3.7   < > 3.6 3.6 4.5  CL 103   < > 103 101 99  CO2 22   < > 21* 22 19  GLUCOSE 123*  --  118* 109*  --   BUN 17   < > 15 15 12   CREATININE 0.70   < > 0.61 0.66 0.6  CALCIUM 8.2*   < > 8.0* 7.7* 8.4*  MG 1.6*  --  2.1 1.8  --    < > = values in this interval not displayed.   Recent Labs    07/14/19 0820 01/15/20 0840 01/18/20 1302  AST 15 12 14*  ALT 8 7 10   ALKPHOS  --   --  47  BILITOT 0.5 0.7 1.0  PROT 6.1 5.9* 5.8*  ALBUMIN  --   --  3.0*   Recent Labs    01/24/20 0414 01/24/20 0414 01/25/20 0428 01/26/20 0446 01/29/20 0000  WBC 11.8*   < > 12.6* 10.5 14.0  NEUTROABS 9.6*  --  10.6* 8.6*  --   HGB 11.1*   < > 9.9* 10.0* 12.6  HCT 35.7*   < > 31.4* 31.6* 38  MCV 88.1  --  87.7 86.8  --   PLT 332   < > 279 255 328   < > = values in this interval not displayed.   Lab Results  Component Value Date   TSH 2.195 01/19/2020   No results found for: HGBA1C Lab Results  Component Value Date   CHOL 191 07/14/2019   HDL 65 07/14/2019   LDLCALC 110 (H) 07/14/2019   TRIG 71 07/14/2019   CHOLHDL 2.9 07/14/2019    Significant Diagnostic Results in last 30 days:  CT ABDOMEN PELVIS W CONTRAST  Result Date: 01/20/2020 CLINICAL DATA:  86 year old female with diarrhea. EXAM: CT ABDOMEN AND PELVIS WITH CONTRAST TECHNIQUE: Multidetector CT imaging of the abdomen and pelvis was performed using the standard protocol following bolus administration of intravenous contrast. CONTRAST:  140mL OMNIPAQUE IOHEXOL 300 MG/ML  SOLN COMPARISON:  Abdominal radiograph dated 01/19/2020 FINDINGS: Lower chest: Small bilateral pleural effusions with associated partial compressive atelectasis of the lower lobes. Pneumonia is not excluded. Clinical correlation is recommended. There is mild cardiomegaly. Multi  vessel coronary vascular calcification. No intra-abdominal free air. Diffuse mesenteric edema and small perihepatic ascites. Hepatobiliary: Minimal irregularity of the liver contour may represent early changes of cirrhosis. Clinical correlation is recommended. No intrahepatic biliary ductal dilatation. The gallbladder is unremarkable.  Pancreas: Unremarkable. No pancreatic ductal dilatation or surrounding inflammatory changes. Spleen: Normal in size without focal abnormality. Adrenals/Urinary Tract: The adrenal glands are unremarkable. There is no hydronephrosis on either side. There is symmetric enhancement and excretion of contrast by both kidneys. There is a 2 cm left renal interpolar cyst and several subcentimeter hypodensities which are too small to characterize. The visualized ureters and urinary bladder appear unremarkable. Stomach/Bowel: There is scattered sigmoid diverticula. Diffuse thickening of the distal colon primarily involving the rectosigmoid most consistent with colitis. Clinical correlation is recommended. There is no bowel obstruction. The appendix is normal. Vascular/Lymphatic: Advanced aortoiliac atherosclerotic disease. There is a 3.5 cm partially thrombosed infrarenal abdominal aortic aneurysm. The IVC is unremarkable. No portal venous gas. There is no adenopathy. Reproductive: The uterus is grossly unremarkable. Other: Diffuse subcutaneous edema and anasarca. Musculoskeletal: Osteopenia with degenerative changes of the spine. No acute osseous pathology. 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 IMPRESSION: 1. Findings most likely represent colitis and less likely sigmoid diverticulitis. Clinical correlation is recommended. No bowel obstruction. Normal appendix. 2. Small bilateral pleural effusions, small ascites and anasarca. 3. 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 4. A 3.5 cm partially thrombosed infrarenal abdominal aortic aneurysm. 5. Aortic Atherosclerosis (ICD10-I70.0). Electronically Signed   By:  Anner Crete M.D.   On: 01/20/2020 20:29   DG Chest Port 1 View  Result Date: 01/19/2020 CLINICAL DATA:  A-fib, dehydration; EXAM: PORTABLE CHEST 1 VIEW COMPARISON:  Radiograph 11/03/2010 FINDINGS: Normal cardiac silhouette. Lungs are hyperinflated. No effusion, infiltrate pneumothorax. No acute osseous abnormality. IMPRESSION: Hyperinflated lungs.  No acute findings. Electronically Signed   By: Suzy Bouchard M.D.   On: 01/19/2020 09:06   DG Abd Portable 1V  Result Date: 01/19/2020 CLINICAL DATA:  Diarrhea. EXAM: PORTABLE ABDOMEN - 1 VIEW COMPARISON:  None. FINDINGS: The bowel gas pattern is normal. No radio-opaque calculi or other significant radiographic abnormality are seen. Extensive vascular calcification noted. Advanced lumbar spine degenerative changes and chronic avascular necrosis of left hip noted. IMPRESSION: Unremarkable bowel gas pattern. No acute findings. Electronically Signed   By: Marlaine Hind M.D.   On: 01/19/2020 12:12   ECHOCARDIOGRAM COMPLETE  Result Date: 01/22/2020    ECHOCARDIOGRAM REPORT   Patient Name:   Terri Acosta Date of Exam: 01/19/2020 Medical Rec #:  [id number]     Height:       62.0 in Accession #:    [id number]    Weight:       102.1 lb Date of Birth:  02-03-1923     BSA:          1.437 m Patient Age:    8 years      BP:           141/93 mmHg Patient Gender: F             HR:           117 bpm. Exam Location:  Inpatient Procedure: 2D Echo Indications:     atrial fibrillation 427.31  History:         Patient has no prior history of Echocardiogram examinations.                  Risk Factors:Hypertension and Dyslipidemia.  Sonographer:     Johny Chess RDCS Referring Phys:  [id number] [place] Diagnosing Phys: Adrian Prows MD IMPRESSIONS  1. Patient in atrial fibrillation with RVR. Left ventricular ejection fraction, by estimation, is 55 to 60%. The left ventricle has normal function.  The left ventricle has no regional wall motion abnormalities. Left  ventricular diastolic parameters are indeterminate.  2. Right ventricular systolic function is normal. The right ventricular size is normal. There is moderately elevated pulmonary artery systolic pressure. The estimated right ventricular systolic pressure is 67.3 mmHg.  3. Left atrial size was moderately dilated.  4. Right atrial size was moderately dilated.  5. Mild restriction of the posterior MV leaflet. There is reverberation artificat in the LA from clacified MV apparatus. . The mitral valve is degenerative. Moderate mitral valve regurgitation. No evidence of mitral stenosis.  6. Tricuspid valve regurgitation is moderate.  7. The aortic valve is tricuspid. Aortic valve regurgitation is not visualized. Mild aortic valve sclerosis is present, with no evidence of aortic valve stenosis.  8. The inferior vena cava is dilated in size with >50% respiratory variability, suggesting right atrial pressure of 8 mmHg. FINDINGS  Left Ventricle: Patient in atrial fibrillation with RVR. Left ventricular ejection fraction, by estimation, is 55 to 60%. The left ventricle has normal function. The left ventricle has no regional wall motion abnormalities. The left ventricular internal  cavity size was normal in size. There is no left ventricular hypertrophy. Left ventricular diastolic parameters are indeterminate. Right Ventricle: The right ventricular size is normal. No increase in right ventricular wall thickness. Right ventricular systolic function is normal. There is moderately elevated pulmonary artery systolic pressure. The tricuspid regurgitant velocity is 3.08 m/s, and with an assumed right atrial pressure of 8 mmHg, the estimated right ventricular systolic pressure is 41.9 mmHg. Left Atrium: Left atrial size was moderately dilated. Right Atrium: Right atrial size was moderately dilated. Pericardium: There is no evidence of pericardial effusion. Mitral Valve: Mild restriction of the posterior MV leaflet. There is  reverberation artificat in the LA from clacified MV apparatus. The mitral valve is degenerative in appearance. There is mild thickening of the mitral valve leaflet(s). There is mild calcification of the mitral valve leaflet(s). Mild mitral annular calcification. Moderate mitral valve regurgitation. No evidence of mitral valve stenosis. Tricuspid Valve: The tricuspid valve is grossly normal. Tricuspid valve regurgitation is moderate. Aortic Valve: The aortic valve is tricuspid. Aortic valve regurgitation is not visualized. Mild aortic valve sclerosis is present, with no evidence of aortic valve stenosis. Pulmonic Valve: The pulmonic valve was grossly normal. Pulmonic valve regurgitation is trivial. Aorta: The aortic root is normal in size and structure. Venous: The inferior vena cava is dilated in size with greater than 50% respiratory variability, suggesting right atrial pressure of 8 mmHg. IAS/Shunts: No atrial level shunt detected by color flow Doppler.  LEFT VENTRICLE PLAX 2D LVIDd:         4.00 cm LVIDs:         3.00 cm LV PW:         1.20 cm LV IVS:        1.10 cm LVOT diam:     2.00 cm LVOT Area:     3.14 cm  IVC IVC diam: 2.20 cm LEFT ATRIUM             Index       RIGHT ATRIUM           Index LA diam:        3.60 cm 2.51 cm/m  RA Area:     16.60 cm LA Vol (A2C):   67.0 ml 46.64 ml/m RA Volume:   39.20 ml  27.29 ml/m LA Vol (A4C):   56.0 ml 38.98 ml/m LA Biplane  Vol: 66.9 ml 46.57 ml/m   AORTA Ao Root diam: 3.40 cm Ao Asc diam:  3.20 cm TRICUSPID VALVE TR Peak grad:   37.9 mmHg TR Vmax:        308.00 cm/s  SHUNTS Systemic Diam: 2.00 cm Adrian Prows MD Electronically signed by Adrian Prows MD Signature Date/Time: 01/22/2020/5:34:36 PM    Final     Assessment/Plan Atrial fibrillation with rapid ventricular response (HCC) Rate controlled on Cardizem and Eliquis Also on Toprol Will change the dose to 75 mg twice daily as her Systolic blood pressure is less than 100 Has follow-up with  cardiology  Bilateral LE edema Combination of right ventricular increased pressure as seen on Echo and Cirrhosis Will continue on Demadex Also Wrap legs D/W the Nurse Repeat BMP pending  Diarrhea, unspecified type Continues to have episodes of diarrhea Finished n Flagyl and Vantin Repeat testing for C. difficile is pending Using Imodium as needed which is helping If no improvement will need GI consult Mild leukocytosis on CBC Repeat CBC pending  Physical deconditioning Discussed with the therapy They think she is making adequate progress and possibly able to go to assisted living  Cirrhosis of liver with mild  ascites,  This was seen in CT scan I discussed this with the daughter patient does have history of using alcohol every day.  Per daughter she was  Alcoholic when she was at home before moving to friend's home Will Check Hepatic Panel   Depression, recurrent ([place]) After d/w her daughter patient agreable to Antidepressant Will try Remeron 7.5 mg at night Cannot use SSRi due to Hyponatremia   Family/ staff Communication:   Labs/tests ordered:  CBC,Hepatic Panel,BMP and magnesium level  Total time spent in this patient care encounter was  45_  minutes; greater than 50% of the visit spent counseling patient and staff, reviewing records , Labs and coordinating care for problems addressed at this encounter.

## 2020-02-06 ENCOUNTER — Non-Acute Institutional Stay (SKILLED_NURSING_FACILITY): Payer: Medicare Other | Admitting: Nurse Practitioner

## 2020-02-06 ENCOUNTER — Encounter: Payer: Self-pay | Admitting: Nurse Practitioner

## 2020-02-06 DIAGNOSIS — I4891 Unspecified atrial fibrillation: Secondary | ICD-10-CM

## 2020-02-06 DIAGNOSIS — R609 Edema, unspecified: Secondary | ICD-10-CM

## 2020-02-06 DIAGNOSIS — J449 Chronic obstructive pulmonary disease, unspecified: Secondary | ICD-10-CM

## 2020-02-06 DIAGNOSIS — H0100B Unspecified blepharitis left eye, upper and lower eyelids: Secondary | ICD-10-CM

## 2020-02-06 DIAGNOSIS — R197 Diarrhea, unspecified: Secondary | ICD-10-CM

## 2020-02-06 DIAGNOSIS — H0100A Unspecified blepharitis right eye, upper and lower eyelids: Secondary | ICD-10-CM

## 2020-02-06 DIAGNOSIS — F5105 Insomnia due to other mental disorder: Secondary | ICD-10-CM

## 2020-02-06 DIAGNOSIS — F418 Other specified anxiety disorders: Secondary | ICD-10-CM

## 2020-02-06 DIAGNOSIS — H01003 Unspecified blepharitis right eye, unspecified eyelid: Secondary | ICD-10-CM | POA: Insufficient documentation

## 2020-02-06 NOTE — Assessment & Plan Note (Addendum)
Hx of asthma, COPD, Congested cough, unable to cough up phlegm, no O2 desaturation, scatted coarse rhonchi noted, CXR 02/05/20 small bilateral pleural effusion, no pulmonary infiltrates.  DuoNeb tid, Mucinex 600mg  bid x 5 days, Doxycycline 100mg  bid/FloraStor bid x 7 days. Observe. Update CBC/diff.

## 2020-02-06 NOTE — Assessment & Plan Note (Signed)
reduced to 3-4x/night to 3-4x/24 hours, denied abd pain, nausea, vomiting, negative C-diff 02/04/20, prn Imodium.  Update BMP

## 2020-02-06 NOTE — Assessment & Plan Note (Signed)
heart rate is controlled, continue Diltiazem 120mg  qd,  Metoprolol 75mg  bid, Eliquis 2.5mg  bid.

## 2020-02-06 NOTE — Assessment & Plan Note (Signed)
Artificial tears tid OU, [employer] cleanse eyelashes bid.

## 2020-02-06 NOTE — Assessment & Plan Note (Signed)
improved, but still moderate BLE, on Torsemide 38m qd, weight is #104Ibs at her baseline 100-105, admission wt #125Ibs. Update CMP/eGFR

## 2020-02-06 NOTE — Progress Notes (Signed)
Location:    [facility] Room Number: 28 Place of Service:  SNF (31) Provider:  Marlana Latus NP   Abigaile Rossie X, NP  Patient Care Team: Charon Akamine X, NP as PCP - General (Internal Medicine) [place] Netta Cedars, MD as Consulting Physician (Orthopedic Surgery) Luberta Mutter, MD as Consulting Physician (Ophthalmology) Sydnee Levans, MD as Consulting Physician (Dermatology) Adrian Prows, MD as Consulting Physician (Cardiology)  Extended Emergency Contact Information Primary Emergency Contact: [place] Phone: [phone] Mobile Phone: [phone] Relation: Daughter  Code Status:  DNR Goals of care: Advanced Directive information Advanced Directives 02/02/2020  Does Patient Have a Medical Advance Directive? Yes  Type of Paramedic of [place];Living will;Out of facility DNR (pink MOST or yellow form)  Does patient want to make changes to medical advance directive? No - Patient declined  Copy of [place] in Chart? Yes - validated most recent copy scanned in chart (See row information)  Would patient like information on creating a medical advance directive? -  Pre-existing out of facility DNR order (yellow form or pink MOST form) Yellow form placed in chart (order not valid for inpatient use)     Chief Complaint  Patient presents with  . Acute Visit    Cough    HPI:  Pt is a 86 y.o. female seen today for an acute visit for cough, diarrhea, sleepiness, swelling in legs  Congested cough, unable to cough up phlegm, no O2 desaturation, scatted coarse rhonchi noted, CXR 02/05/20 small bilateral pleural effusion, no pulmonary infiltrates. Hx of asthma/COPD  C/o sleepiness since Mirtazapine 7.28m qd started, desires to reduce it.   Diarrhea, reduced to 3-4x/night to 3-4x/24 hours, denied abd pain, nausea, vomiting, negative C-diff 02/04/20, prn Imodium.    Edema BLE,  ventricular pressure, cirrhosis,  improved, but still moderate BLE, on Torsemide 270mqd, weight is #104Ibs at her baseline 100-105, admission wt #125Ibs.   Afib, heart rate is controlled, on Diltiazem 12048md,  Metoprolol 40m68md, Eliquis 2.5mg 38m.    Past Medical History:  Diagnosis Date  . Abnormal uterine bleeding   . Actinic keratosis 02/13/2012  . Acute bronchospasm 10/31/2011  . Cancer (HCC) [place]2009   Skin cancer of low back GoodrSarajane Jews . Cataract   . Disorder of bone and cartilage, unspecified 02/18/2007  . Dizziness and giddiness 08/30/2010  . Dysmenorrhea   . Edema 12/19/2011  . Endometriosis   . 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 Osteoarthrosis, unspecified whether generalized or localized, unspecified site 08/20/2006  . Osteoporosis   . Other abnormal blood chemistry 03/14/2011  . Other and unspecified hyperlipidemia 10/18/2010  . Palpitations 02/13/2012  . Postmenopausal bleeding 08/30/2010  . Shortness of breath 11/28/2011  . Supraventricular premature beats 05/23/2011  . Unspecified essential hypertension 08/20/2006   Past Surgical History:  Procedure Laterality Date  . CARPAL TUNNEL RELEASE  2004   S. Norris MD  . CATARACT EXTRACTION W/ INTRAOCULAR LENS IMPLANT Right 2010  . CATARACT EXTRACTION W/ INTRAOCULAR LENS IMPLANT Left 10/2012  . DILATION AND CURETTAGE OF UTERUS    . EYE SURGERY Right 07/2009   cataract extraction/IOLI McCueEllie Lunch . KNEE ARTHROSCOPY Right 2008   Torn meniscus, Dr. NorriVeverly Fellsllergies  Allergen Reactions  . Lomotil [Diphenoxylate] Nausea And Vomiting  . Sulfa Antibiotics Other (See Comments)    Unknown   . Amlodipine Swelling    Allergies as of 02/06/2020  Reactions   Lomotil [diphenoxylate] Nausea And Vomiting   Sulfa Antibiotics Other (See Comments)   Unknown    Amlodipine Swelling      Medication List       Accurate as of February 06, 2020 11:49 AM. If you have any questions, ask your nurse or doctor.         apixaban 2.5 MG Tabs tablet Commonly known as: ELIQUIS Take 1 tablet (2.5 mg total) by mouth 2 (two) times daily.   diltiazem 120 MG 24 hr capsule Commonly known as: CARDIZEM CD Take 1 capsule (120 mg total) by mouth daily.   loperamide 2 MG capsule Commonly known as: IMODIUM Take 1 capsule (2 mg total) by mouth every 6 (six) hours as needed for diarrhea or loose stools.   metoprolol tartrate 50 MG tablet Commonly known as: LOPRESSOR Take 75 mg by mouth 2 (two) times daily. What changed: Another medication with the same name was removed. Continue taking this medication, and follow the directions you see here. Changed by: Katerine Morua X Mikalah Skyles, NP   mirtazapine 7.5 MG tablet Commonly known as: REMERON Take 7.5 mg by mouth at bedtime.   multivitamin-lutein Caps capsule Take 1 capsule by mouth in the morning and at bedtime.   potassium chloride SA 20 MEQ tablet Commonly known as: KLOR-CON Take 40 mEq by mouth daily.   Robafen DM 10-100 MG/5ML liquid Generic drug: Dextromethorphan-guaiFENesin Take 5 mLs by mouth every 6 (six) hours as needed.   saccharomyces boulardii 250 MG capsule Commonly known as: FLORASTOR Take 250 mg by mouth 2 (two) times daily.   torsemide 20 MG tablet Commonly known as: DEMADEX Take 20 mg by mouth daily.   Tubersol 5 UNIT/0.1ML injection Generic drug: tuberculin Inject into the skin once. Once a day every 14 days   zinc oxide 20 % ointment Apply 1 application topically as needed for irritation.       Review of Systems  Constitutional: Positive for activity change, appetite change and fatigue. Negative for fever.  HENT: Positive for hearing loss. Negative for congestion, trouble swallowing and voice change.   Eyes: Negative for visual disturbance.  Respiratory: Positive for cough and shortness of breath. Negative for chest tightness and wheezing.   Cardiovascular: Positive for leg swelling. Negative for chest pain and palpitations.    Gastrointestinal: Positive for diarrhea. Negative for abdominal pain, constipation, nausea and vomiting.  Genitourinary: Negative for difficulty urinating, dysuria and urgency.  Musculoskeletal: Positive for gait problem.  Skin: Negative for color change and pallor.  Neurological: Negative for speech difficulty, weakness and light-headedness.  Psychiatric/Behavioral: Positive for sleep disturbance. Negative for confusion. The patient is not nervous/anxious.     Immunization History  Administered Date(s) Administered  . Influenza Whole 08/21/2010, 05/21/2012  . Influenza, High Dose Seasonal PF 05/30/2017, 06/04/2019  . Influenza,inj,Quad PF,6+ Mos 05/23/2018  . Influenza-Unspecified 06/04/2014, 05/20/2015, 06/08/2016  . Pneumococcal Conjugate-13 08/22/1999  . Td 08/22/1999  . Zoster 08/21/2005   Pertinent  Health Maintenance Due  Topic Date Due  . DEXA SCAN  Never done  . PNA vac Low Risk Adult (2 of 2 - PPSV23) 08/21/2000  . INFLUENZA VACCINE  03/21/2020   Fall Risk  07/23/2019 01/15/2019 07/17/2018 06/04/2018 09/12/2017  Falls in the past year? 0 0 0 No No  Number falls in past yr: 0 0 0 - -  Injury with Fall? 0 0 0 - -   Functional Status Survey:    Vitals:   02/06/20 0912  BP: [name])  150/77  Pulse: 77  Resp: (!) 22  Temp: 98 F (36.7 C)  SpO2: 93%  Weight: 104 lb 6.4 oz (47.4 kg)  Height: _0  (1.575 m)   Body mass index is 19.1 kg/m. Physical Exam Vitals and nursing note reviewed.  Constitutional:      Comments: sleepy  HENT:     Head: Normocephalic and atraumatic.     Mouth/Throat:     Mouth: Mucous membranes are moist.  Eyes:     Extraocular Movements: Extraocular movements intact.     Conjunctiva/sclera: Conjunctivae normal.     Pupils: Pupils are equal, round, and reactive to light.     Comments: Crusted eyelashes, left eye low vision  Cardiovascular:     Rate and Rhythm: Normal rate. Rhythm irregular.     Heart sounds: No murmur heard.       Comments: Weak DP pulses.  Pulmonary:     Breath sounds: Rhonchi present. No wheezing or rales.  Abdominal:     General: Bowel sounds are normal. There is no distension.     Palpations: Abdomen is soft.     Tenderness: There is no abdominal tenderness. There is no guarding or rebound.  Musculoskeletal:     Cervical back: Normal range of motion and neck supple.     Right lower leg: Edema present.     Left lower leg: Edema present.     Comments: 1-2 edema BLE  Skin:    General: Skin is warm and dry.  Neurological:     General: No focal deficit present.     Mental Status: She is alert and oriented to person, place, and time. Mental status is at baseline.     Gait: Gait abnormal.  Psychiatric:        Mood and Affect: Mood normal.        Behavior: Behavior normal.        Thought Content: Thought content normal.        Judgment: Judgment normal.     Labs reviewed: Recent Labs    01/24/20 0414 01/24/20 0414 01/25/20 0428 01/26/20 0446 01/29/20 0000  NA 133*   < > 133* 130* 132*  K 3.7   < > 3.6 3.6 4.5  CL 103   < > 103 101 99  CO2 22   < > 21* 22 19  GLUCOSE 123*  --  118* 109*  --   BUN 17   < > _1 CREATININE 0.70   < > 0.61 0.66 0.6  CALCIUM 8.2*   < > 8.0* 7.7* 8.4*  MG 1.6*  --  2.1 1.8  --    < > = values in this interval not displayed.   Recent Labs    07/14/19 0820 01/15/20 0840 01/18/20 1302  AST 15 12 14*  ALT _2 ALKPHOS  --   --  47  BILITOT 0.5 0.7 1.0  PROT 6.1 5.9* 5.8*  ALBUMIN  --   --  3.0*   Recent Labs    01/24/20 0414 01/24/20 0414 01/25/20 0428 01/26/20 0446 01/29/20 0000  WBC 11.8*   < > 12.6* 10.5 14.0  NEUTROABS 9.6*  --  10.6* 8.6*  --   HGB 11.1*   < > 9.9* 10.0* 12.6  HCT 35.7*   < > 31.4* 31.6* 38  MCV 88.1  --  87.7 86.8  --   PLT 332   < > 279 255 328   < > =  values in this interval not displayed.   Lab Results  Component Value Date   TSH 2.195 01/19/2020   No results found for: HGBA1C Lab Results   Component Value Date   CHOL 191 07/14/2019   HDL 65 07/14/2019   LDLCALC 110 (H) 07/14/2019   TRIG 71 07/14/2019   CHOLHDL 2.9 07/14/2019    Significant Diagnostic Results in last 30 days:  CT ABDOMEN PELVIS W CONTRAST  Result Date: 01/20/2020 CLINICAL DATA:  86 year old female with diarrhea. EXAM: CT ABDOMEN AND PELVIS WITH CONTRAST TECHNIQUE: Multidetector CT imaging of the abdomen and pelvis was performed using the standard protocol following bolus administration of intravenous contrast. CONTRAST:  130m OMNIPAQUE IOHEXOL 300 MG/ML  SOLN COMPARISON:  Abdominal radiograph dated 01/19/2020 FINDINGS: Lower chest: Small bilateral pleural effusions with associated partial compressive atelectasis of the lower lobes. Pneumonia is not excluded. Clinical correlation is recommended. There is mild cardiomegaly. Multi vessel coronary vascular calcification. No intra-abdominal free air. Diffuse mesenteric edema and small perihepatic ascites. Hepatobiliary: Minimal irregularity of the liver contour may represent early changes of cirrhosis. Clinical correlation is recommended. No intrahepatic biliary ductal dilatation. The gallbladder is unremarkable. Pancreas: Unremarkable. No pancreatic ductal dilatation or surrounding inflammatory changes. Spleen: Normal in size without focal abnormality. Adrenals/Urinary Tract: The adrenal glands are unremarkable. There is no hydronephrosis on either side. There is symmetric enhancement and excretion of contrast by both kidneys. There is a 2 cm left renal interpolar cyst and several subcentimeter hypodensities which are too small to characterize. The visualized ureters and urinary bladder appear unremarkable. Stomach/Bowel: There is scattered sigmoid diverticula. Diffuse thickening of the distal colon primarily involving the rectosigmoid most consistent with colitis. Clinical correlation is recommended. There is no bowel obstruction. The appendix is normal. Vascular/Lymphatic:  Advanced aortoiliac atherosclerotic disease. There is a 3.5 cm partially thrombosed infrarenal abdominal aortic aneurysm. The IVC is unremarkable. No portal venous gas. There is no adenopathy. Reproductive: The uterus is grossly unremarkable. Other: Diffuse subcutaneous edema and anasarca. Musculoskeletal: Osteopenia with degenerative changes of the spine. No acute osseous pathology. 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 IMPRESSION: 1. Findings most likely represent colitis and less likely sigmoid diverticulitis. Clinical correlation is recommended. No bowel obstruction. Normal appendix. 2. Small bilateral pleural effusions, small ascites and anasarca. 3. 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 4. A 3.5 cm partially thrombosed infrarenal abdominal aortic aneurysm. 5. Aortic Atherosclerosis (ICD10-I70.0). Electronically Signed   By: AAnner CreteM.D.   On: 01/20/2020 20:29   DG Chest Port 1 View  Result Date: 01/19/2020 CLINICAL DATA:  A-fib, dehydration; EXAM: PORTABLE CHEST 1 VIEW COMPARISON:  Radiograph 11/03/2010 FINDINGS: Normal cardiac silhouette. Lungs are hyperinflated. No effusion, infiltrate pneumothorax. No acute osseous abnormality. IMPRESSION: Hyperinflated lungs.  No acute findings. Electronically Signed   By: SSuzy BouchardM.D.   On: 01/19/2020 09:06   DG Abd Portable 1V  Result Date: 01/19/2020 CLINICAL DATA:  Diarrhea. EXAM: PORTABLE ABDOMEN - 1 VIEW COMPARISON:  None. FINDINGS: The bowel gas pattern is normal. No radio-opaque calculi or other significant radiographic abnormality are seen. Extensive vascular calcification noted. Advanced lumbar spine degenerative changes and chronic avascular necrosis of left hip noted. IMPRESSION: Unremarkable bowel gas pattern. No acute findings. Electronically Signed   By: JMarlaine HindM.D.   On: 01/19/2020 12:12   ECHOCARDIOGRAM COMPLETE  Result Date: 01/22/2020    ECHOCARDIOGRAM REPORT   Patient Name:   LDierdre SearlesDate of Exam: 01/19/2020 Medical Rec  #:  [id number]    Height:  62.0 in Accession #:    [id number]    Weight:       102.1 lb Date of Birth:  03/27/1923     BSA:          1.437 m Patient Age:    31 years      BP:           141/93 mmHg Patient Gender: F             HR:           117 bpm. Exam Location:  Inpatient Procedure: 2D Echo Indications:     atrial fibrillation 427.31  History:         Patient has no prior history of Echocardiogram examinations.                  Risk Factors:Hypertension and Dyslipidemia.  Sonographer:     Johny Chess RDCS Referring Phys:  [id number] [place] Diagnosing Phys: Adrian Prows MD IMPRESSIONS  1. Patient in atrial fibrillation with RVR. Left ventricular ejection fraction, by estimation, is 55 to 60%. The left ventricle has normal function. The left ventricle has no regional wall motion abnormalities. Left ventricular diastolic parameters are indeterminate.  2. Right ventricular systolic function is normal. The right ventricular size is normal. There is moderately elevated pulmonary artery systolic pressure. The estimated right ventricular systolic pressure is 03.2 mmHg.  3. Left atrial size was moderately dilated.  4. Right atrial size was moderately dilated.  5. Mild restriction of the posterior MV leaflet. There is reverberation artificat in the LA from clacified MV apparatus. . The mitral valve is degenerative. Moderate mitral valve regurgitation. No evidence of mitral stenosis.  6. Tricuspid valve regurgitation is moderate.  7. The aortic valve is tricuspid. Aortic valve regurgitation is not visualized. Mild aortic valve sclerosis is present, with no evidence of aortic valve stenosis.  8. The inferior vena cava is dilated in size with >50% respiratory variability, suggesting right atrial pressure of 8 mmHg. FINDINGS  Left Ventricle: Patient in atrial fibrillation with RVR. Left ventricular ejection fraction, by estimation, is 55 to 60%. The left ventricle has normal function. The left ventricle has  no regional wall motion abnormalities. The left ventricular internal  cavity size was normal in size. There is no left ventricular hypertrophy. Left ventricular diastolic parameters are indeterminate. Right Ventricle: The right ventricular size is normal. No increase in right ventricular wall thickness. Right ventricular systolic function is normal. There is moderately elevated pulmonary artery systolic pressure. The tricuspid regurgitant velocity is 3.08 m/s, and with an assumed right atrial pressure of 8 mmHg, the estimated right ventricular systolic pressure is 12.2 mmHg. Left Atrium: Left atrial size was moderately dilated. Right Atrium: Right atrial size was moderately dilated. Pericardium: There is no evidence of pericardial effusion. Mitral Valve: Mild restriction of the posterior MV leaflet. There is reverberation artificat in the LA from clacified MV apparatus. The mitral valve is degenerative in appearance. There is mild thickening of the mitral valve leaflet(s). There is mild calcification of the mitral valve leaflet(s). Mild mitral annular calcification. Moderate mitral valve regurgitation. No evidence of mitral valve stenosis. Tricuspid Valve: The tricuspid valve is grossly normal. Tricuspid valve regurgitation is moderate. Aortic Valve: The aortic valve is tricuspid. Aortic valve regurgitation is not visualized. Mild aortic valve sclerosis is present, with no evidence of aortic valve stenosis. Pulmonic Valve: The pulmonic valve was grossly normal. Pulmonic valve regurgitation is trivial. Aorta: The aortic root is normal  in size and structure. Venous: The inferior vena cava is dilated in size with greater than 50% respiratory variability, suggesting right atrial pressure of 8 mmHg. IAS/Shunts: No atrial level shunt detected by color flow Doppler.  LEFT VENTRICLE PLAX 2D LVIDd:         4.00 cm LVIDs:         3.00 cm LV PW:         1.20 cm LV IVS:        1.10 cm LVOT diam:     2.00 cm LVOT Area:     3.14  cm  IVC IVC diam: 2.20 cm LEFT ATRIUM             Index       RIGHT ATRIUM           Index LA diam:        3.60 cm 2.51 cm/m  RA Area:     16.60 cm LA Vol (A2C):   67.0 ml 46.64 ml/m RA Volume:   39.20 ml  27.29 ml/m LA Vol (A4C):   56.0 ml 38.98 ml/m LA Biplane Vol: 66.9 ml 46.57 ml/m   AORTA Ao Root diam: 3.40 cm Ao Asc diam:  3.20 cm TRICUSPID VALVE TR Peak grad:   37.9 mmHg TR Vmax:        308.00 cm/s  SHUNTS Systemic Diam: 2.00 cm Adrian Prows MD Electronically signed by Adrian Prows MD Signature Date/Time: 01/22/2020/5:34:36 PM    Final     Assessment/Plan COPD (chronic obstructive pulmonary disease) Hx of asthma, COPD, Congested cough, unable to cough up phlegm, no O2 desaturation, scatted coarse rhonchi noted, CXR 02/05/20 small bilateral pleural effusion, no pulmonary infiltrates.  DuoNeb tid, Mucinex 666m bid x 5 days, Doxycycline 1077mbid/FloraStor bid x 7 days. Observe. Update CBC/diff.   Atrial fibrillation with rapid ventricular response (HCC) heart rate is controlled, continue Diltiazem 12078md,  Metoprolol 50m72md, Eliquis 2.5mg 3m.    Diarrhea reduced to 3-4x/night to 3-4x/24 hours, denied abd pain, nausea, vomiting, negative C-diff 02/04/20, prn Imodium.  Update BMP   Edema  improved, but still moderate BLE, on Torsemide 20mg 52mweight is #104Ibs at her baseline 100-105, admission wt #125Ibs. Update CMP/eGFR   Insomnia secondary to depression with anxiety Long standing not sleeping well at night, appetite has been poor since hospitalization, started Mirtazapine about 2-3 days ago, c/o too sleepy during day, desires to reduce it to qod.   Blepharitis of both eyes Artificial tears tid OU, [employer] cleanse eyelashes bid.      Family/ staff Communication: plan of care reviewed with the patient and charge nurse.   Labs/tests ordered:  CBC/diff, CMP/eGFR  Time spend 35 minutes.

## 2020-02-06 NOTE — Assessment & Plan Note (Signed)
Long standing not sleeping well at night, appetite has been poor since hospitalization, started Mirtazapine about 2-3 days ago, c/o too sleepy during day, desires to reduce it to qod.

## 2020-02-09 ENCOUNTER — Non-Acute Institutional Stay (SKILLED_NURSING_FACILITY): Payer: Medicare Other | Admitting: Internal Medicine

## 2020-02-09 ENCOUNTER — Encounter: Payer: Self-pay | Admitting: Internal Medicine

## 2020-02-09 DIAGNOSIS — R609 Edema, unspecified: Secondary | ICD-10-CM | POA: Diagnosis not present

## 2020-02-09 DIAGNOSIS — F5105 Insomnia due to other mental disorder: Secondary | ICD-10-CM

## 2020-02-09 DIAGNOSIS — I4891 Unspecified atrial fibrillation: Secondary | ICD-10-CM

## 2020-02-09 DIAGNOSIS — R197 Diarrhea, unspecified: Secondary | ICD-10-CM

## 2020-02-09 DIAGNOSIS — F418 Other specified anxiety disorders: Secondary | ICD-10-CM

## 2020-02-09 DIAGNOSIS — J449 Chronic obstructive pulmonary disease, unspecified: Secondary | ICD-10-CM | POA: Diagnosis not present

## 2020-02-09 NOTE — Progress Notes (Signed)
Location: [facility] Room Number: 28 Place of Service:  SNF (31)  Provider:   Code Status:  Goals of Care:  Advanced Directives 02/02/2020  Does Patient Have a Medical Advance Directive? Yes  Type of Paramedic of [place];Living will;Out of facility DNR (pink MOST or yellow form)  Does patient want to make changes to medical advance directive? No - Patient declined  Copy of [place] in Chart? Yes - validated most recent copy scanned in chart (See row information)  Would patient like information on creating a medical advance directive? -  Pre-existing out of facility DNR order (yellow form or pink MOST form) Yellow form placed in chart (order not valid for inpatient use)     Chief Complaint  Patient presents with  . Acute Visit    Follow up    HPI: Patient is a 86 y.o. female seen today for an acute visit for Follow up of her Sob and LE edema Patient was admitted in the hospital from 5/30-6/7 for diarrhea diagnosed as colitis and A. fib with rapid ventricular response  Patient has h/o Hypertension, Hyperlipidemia, Osteoarthritis, Unstable Gait Wheelchair Dependent,h/o Vaginal Atropy, Macular Degeneration  Diarrhea C Diff negative  Still has few Episodes but says it is better SOB Chest Xray was negative Started on Doxy per [place]. And Also on Nebs O2 wtill around 86-90% Continues Oxygen Prn LE edema Has Lost almost 20 lbs in Fluid weight Edema still there. Needing Wrapping of her legs Weakness Continues to be issue Unfortunately not able to do therapy 5 days and now is going to be on Reduced days Depression Better on Remeron every other day   Past Medical History:  Diagnosis Date  . Abnormal uterine bleeding   . Actinic keratosis 02/13/2012  . Acute bronchospasm 10/31/2011  . Cancer ([place]) 04/2008   Skin cancer of low back [name], MD  . Cataract   . Disorder of bone and cartilage, unspecified  02/18/2007  . Dizziness and giddiness 08/30/2010  . Dysmenorrhea   . Edema 12/19/2011  . Endometriosis   . 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 Osteoarthrosis, unspecified whether generalized or localized, unspecified site 08/20/2006  . Osteoporosis   . Other abnormal blood chemistry 03/14/2011  . Other and unspecified hyperlipidemia 10/18/2010  . Palpitations 02/13/2012  . Postmenopausal bleeding 08/30/2010  . Shortness of breath 11/28/2011  . Supraventricular premature beats 05/23/2011  . Unspecified essential hypertension 08/20/2006    Past Surgical History:  Procedure Laterality Date  . CARPAL TUNNEL RELEASE  2004   S. Norris MD  . CATARACT EXTRACTION W/ INTRAOCULAR LENS IMPLANT Right 2010  . CATARACT EXTRACTION W/ INTRAOCULAR LENS IMPLANT Left 10/2012  . DILATION AND CURETTAGE OF UTERUS    . EYE SURGERY Right 07/2009   cataract extraction/IOLI [name], MD  . KNEE ARTHROSCOPY Right 2008   Torn meniscus, Dr. Veverly Fells    Allergies  Allergen Reactions  . Lomotil [Diphenoxylate] Nausea And Vomiting  . Sulfa Antibiotics Other (See Comments)    Unknown   . Amlodipine Swelling    Outpatient Encounter Medications as of 02/09/2020  Medication Sig  . apixaban (ELIQUIS) 2.5 MG TABS tablet Take 1 tablet (2.5 mg total) by mouth 2 (two) times daily.  [name] diltiazem (CARDIZEM CD) 120 MG 24 hr capsule Take 1 capsule (120 mg total) by mouth daily.  [name] doxycycline (VIBRAMYCIN) 100 MG capsule Take 100 mg by mouth 2 (two) times daily.  [name] guaiFENesin (MUCINEX) 600  MG 12 hr tablet Take by mouth 2 (two) times daily.  [name] ipratropium-albuterol (DUONEB) 0.5-2.5 (3) MG/3ML SOLN Take 3 mLs by nebulization in the morning, at noon, and at bedtime.  [name] loperamide (IMODIUM) 2 MG capsule Take 1 capsule (2 mg total) by mouth every 6 (six) hours as needed for diarrhea or loose stools.  . metoprolol tartrate (LOPRESSOR) 50 MG tablet Take 75 mg by mouth 2 (two) times daily.  . mirtazapine (REMERON) 7.5 MG  tablet Take 7.5 mg by mouth every other day.   . multivitamin-lutein (OCUVITE-LUTEIN) CAPS capsule Take 1 capsule by mouth in the morning and at bedtime.   . potassium chloride SA (KLOR-CON) 20 MEQ tablet Take 40 mEq by mouth daily.  [name] saccharomyces boulardii (FLORASTOR) 250 MG capsule Take 250 mg by mouth 2 (two) times daily.  [name] torsemide (DEMADEX) 20 MG tablet Take 20 mg by mouth daily.  [name] tuberculin (TUBERSOL) 5 UNIT/0.1ML injection Inject into the skin once. Once a day every 14 days  . zinc oxide 20 % ointment Apply 1 application topically as needed for irritation.   No facility-administered encounter medications on file as of 02/09/2020.    Review of Systems:  Review of Systems  Constitutional: Positive for activity change, appetite change and unexpected weight change.  HENT: Negative.   Respiratory: Positive for cough and shortness of breath.   Cardiovascular: Positive for leg swelling.  Gastrointestinal: Positive for diarrhea.  Genitourinary: Negative.   Musculoskeletal: Positive for gait problem.  Neurological: Positive for weakness.  Psychiatric/Behavioral: Positive for dysphoric mood.  All other systems reviewed and are negative.   Health Maintenance  Topic Date Due  . COVID-19 Vaccine (1) Never done  . DEXA SCAN  Never done  . PNA vac Low Risk Adult (2 of 2 - PPSV23) 08/21/2000  . TETANUS/TDAP  08/21/2009  . INFLUENZA VACCINE  03/21/2020    Physical Exam: Vitals:   02/09/20 1510  BP: (!) 150/77  Pulse: 77  Resp: (!) 22  Temp: 98 F (36.7 C)  SpO2: 93%  Weight: 101 lb (45.8 kg)  Height: 5\' 2"  (1.575 m)   Body mass index is 18.47 kg/m. Physical Exam Constitutional: Oriented to person, place, and time. Well-developed and well-nourished.  HENT:  Head: Normocephalic.  Mouth/Throat: Oropharynx is clear and moist.  Eyes: Pupils are equal, round, and reactive to light.  Neck: Neck supple.  Cardiovascular: Normal rate and normal heart sounds.  No murmur  heard.irregular Pulmonary/Chest: Effort normal and breath sounds normal. No respiratory distress. No wheezes. She has no rales.  Abdominal: Soft. Bowel sounds are normal. No distension. There is no tenderness. There is no rebound.  Musculoskeletal: Moderate Edema Bilateral Right more then left Lymphadenopathy: none Neurological: Alert and oriented to person, place, and time.  Skin: Skin is warm and dry.  Psychiatric: Normal mood and affect. Behavior is normal. Thought content normal.   Labs reviewed: Basic Metabolic Panel: Recent Labs    07/14/19 0820 01/15/20 0840 01/19/20 0820 01/20/20 0311 01/24/20 0414 01/24/20 0414 01/25/20 0428 01/26/20 0446 01/29/20 0000  NA 136   < >  --    < > 133*   < > 133* 130* 132*  K 3.7   < >  --    < > 3.7   < > 3.6 3.6 4.5  CL 94*   < >  --    < > 103   < > 103 101 99  CO2 30   < >  --    < >  22   < > 21* 22 19  GLUCOSE 98   < >  --    < > 123*  --  118* 109*  --   BUN 17   < >  --    < > 17   < > 15 15 12   CREATININE 0.58*   < >  --    < > 0.70   < > 0.61 0.66 0.6  CALCIUM 9.4   < >  --    < > 8.2*   < > 8.0* 7.7* 8.4*  MG  --    < >  --    < > 1.6*  --  2.1 1.8  --   TSH 1.78  --  2.195  --   --   --   --   --   --    < > = values in this interval not displayed.   Liver Function Tests: Recent Labs    07/14/19 0820 01/15/20 0840 01/18/20 1302  AST 15 12 14*  ALT 8 7 10   ALKPHOS  --   --  47  BILITOT 0.5 0.7 1.0  PROT 6.1 5.9* 5.8*  ALBUMIN  --   --  3.0*   No results for input(s): LIPASE, AMYLASE in the last 8760 hours. No results for input(s): AMMONIA in the last 8760 hours. CBC: Recent Labs    01/24/20 0414 01/24/20 0414 01/25/20 0428 01/26/20 0446 01/29/20 0000  WBC 11.8*   < > 12.6* 10.5 14.0  NEUTROABS 9.6*  --  10.6* 8.6*  --   HGB 11.1*   < > 9.9* 10.0* 12.6  HCT 35.7*   < > 31.4* 31.6* 38  MCV 88.1  --  87.7 86.8  --   PLT 332   < > 279 255 328   < > = values in this interval not displayed.   Lipid  Panel: Recent Labs    07/14/19 0820  CHOL 191  HDL 65  LDLCALC 110*  TRIG 71  CHOLHDL 2.9   No results found for: HGBA1C  Procedures since last visit: CT ABDOMEN PELVIS W CONTRAST  Result Date: 01/20/2020 CLINICAL DATA:  86 year old female with diarrhea. EXAM: CT ABDOMEN AND PELVIS WITH CONTRAST TECHNIQUE: Multidetector CT imaging of the abdomen and pelvis was performed using the standard protocol following bolus administration of intravenous contrast. CONTRAST:  18mL OMNIPAQUE IOHEXOL 300 MG/ML  SOLN COMPARISON:  Abdominal radiograph dated 01/19/2020 FINDINGS: Lower chest: Small bilateral pleural effusions with associated partial compressive atelectasis of the lower lobes. Pneumonia is not excluded. Clinical correlation is recommended. There is mild cardiomegaly. Multi vessel coronary vascular calcification. No intra-abdominal free air. Diffuse mesenteric edema and small perihepatic ascites. Hepatobiliary: Minimal irregularity of the liver contour may represent early changes of cirrhosis. Clinical correlation is recommended. No intrahepatic biliary ductal dilatation. The gallbladder is unremarkable. Pancreas: Unremarkable. No pancreatic ductal dilatation or surrounding inflammatory changes. Spleen: Normal in size without focal abnormality. Adrenals/Urinary Tract: The adrenal glands are unremarkable. There is no hydronephrosis on either side. There is symmetric enhancement and excretion of contrast by both kidneys. There is a 2 cm left renal interpolar cyst and several subcentimeter hypodensities which are too small to characterize. The visualized ureters and urinary bladder appear unremarkable. Stomach/Bowel: There is scattered sigmoid diverticula. Diffuse thickening of the distal colon primarily involving the rectosigmoid most consistent with colitis. Clinical correlation is recommended. There is no bowel obstruction. The appendix is normal. Vascular/Lymphatic: Advanced aortoiliac atherosclerotic  disease.  There is a 3.5 cm partially thrombosed infrarenal abdominal aortic aneurysm. The IVC is unremarkable. No portal venous gas. There is no adenopathy. Reproductive: The uterus is grossly unremarkable. Other: Diffuse subcutaneous edema and anasarca. Musculoskeletal: Osteopenia with degenerative changes of the spine. No acute osseous pathology. 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 IMPRESSION: 1. Findings most likely represent colitis and less likely sigmoid diverticulitis. Clinical correlation is recommended. No bowel obstruction. Normal appendix. 2. Small bilateral pleural effusions, small ascites and anasarca. 3. 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 4. A 3.5 cm partially thrombosed infrarenal abdominal aortic aneurysm. 5. Aortic Atherosclerosis (ICD10-I70.0). Electronically Signed   By: Anner Crete M.D.   On: 01/20/2020 20:29   DG Chest Port 1 View  Result Date: 01/19/2020 CLINICAL DATA:  A-fib, dehydration; EXAM: PORTABLE CHEST 1 VIEW COMPARISON:  Radiograph 11/03/2010 FINDINGS: Normal cardiac silhouette. Lungs are hyperinflated. No effusion, infiltrate pneumothorax. No acute osseous abnormality. IMPRESSION: Hyperinflated lungs.  No acute findings. Electronically Signed   By: Suzy Bouchard M.D.   On: 01/19/2020 09:06   DG Abd Portable 1V  Result Date: 01/19/2020 CLINICAL DATA:  Diarrhea. EXAM: PORTABLE ABDOMEN - 1 VIEW COMPARISON:  None. FINDINGS: The bowel gas pattern is normal. No radio-opaque calculi or other significant radiographic abnormality are seen. Extensive vascular calcification noted. Advanced lumbar spine degenerative changes and chronic avascular necrosis of left hip noted. IMPRESSION: Unremarkable bowel gas pattern. No acute findings. Electronically Signed   By: Marlaine Hind M.D.   On: 01/19/2020 12:12   ECHOCARDIOGRAM COMPLETE  Result Date: 01/22/2020    ECHOCARDIOGRAM REPORT   Patient Name:   Terri Acosta Date of Exam: 01/19/2020 Medical Rec #:  [id number]     Height:       62.0  in Accession #:    [id number]    Weight:       102.1 lb Date of Birth:  January 19, 1923     BSA:          1.437 m Patient Age:    62 years      BP:           141/93 mmHg Patient Gender: F             HR:           117 bpm. Exam Location:  Inpatient Procedure: 2D Echo Indications:     atrial fibrillation 427.31  History:         Patient has no prior history of Echocardiogram examinations.                  Risk Factors:Hypertension and Dyslipidemia.  Sonographer:     Johny Chess RDCS Referring Phys:  [id number] [place] Diagnosing Phys: Adrian Prows MD IMPRESSIONS  1. Patient in atrial fibrillation with RVR. Left ventricular ejection fraction, by estimation, is 55 to 60%. The left ventricle has normal function. The left ventricle has no regional wall motion abnormalities. Left ventricular diastolic parameters are indeterminate.  2. Right ventricular systolic function is normal. The right ventricular size is normal. There is moderately elevated pulmonary artery systolic pressure. The estimated right ventricular systolic pressure is 82.9 mmHg.  3. Left atrial size was moderately dilated.  4. Right atrial size was moderately dilated.  5. Mild restriction of the posterior MV leaflet. There is reverberation artificat in the LA from clacified MV apparatus. . The mitral valve is degenerative. Moderate mitral valve regurgitation. No evidence of mitral stenosis.  6. Tricuspid valve regurgitation is moderate.  7. The  aortic valve is tricuspid. Aortic valve regurgitation is not visualized. Mild aortic valve sclerosis is present, with no evidence of aortic valve stenosis.  8. The inferior vena cava is dilated in size with >50% respiratory variability, suggesting right atrial pressure of 8 mmHg. FINDINGS  Left Ventricle: Patient in atrial fibrillation with RVR. Left ventricular ejection fraction, by estimation, is 55 to 60%. The left ventricle has normal function. The left ventricle has no regional wall motion abnormalities.  The left ventricular internal  cavity size was normal in size. There is no left ventricular hypertrophy. Left ventricular diastolic parameters are indeterminate. Right Ventricle: The right ventricular size is normal. No increase in right ventricular wall thickness. Right ventricular systolic function is normal. There is moderately elevated pulmonary artery systolic pressure. The tricuspid regurgitant velocity is 3.08 m/s, and with an assumed right atrial pressure of 8 mmHg, the estimated right ventricular systolic pressure is 55.7 mmHg. Left Atrium: Left atrial size was moderately dilated. Right Atrium: Right atrial size was moderately dilated. Pericardium: There is no evidence of pericardial effusion. Mitral Valve: Mild restriction of the posterior MV leaflet. There is reverberation artificat in the LA from clacified MV apparatus. The mitral valve is degenerative in appearance. There is mild thickening of the mitral valve leaflet(s). There is mild calcification of the mitral valve leaflet(s). Mild mitral annular calcification. Moderate mitral valve regurgitation. No evidence of mitral valve stenosis. Tricuspid Valve: The tricuspid valve is grossly normal. Tricuspid valve regurgitation is moderate. Aortic Valve: The aortic valve is tricuspid. Aortic valve regurgitation is not visualized. Mild aortic valve sclerosis is present, with no evidence of aortic valve stenosis. Pulmonic Valve: The pulmonic valve was grossly normal. Pulmonic valve regurgitation is trivial. Aorta: The aortic root is normal in size and structure. Venous: The inferior vena cava is dilated in size with greater than 50% respiratory variability, suggesting right atrial pressure of 8 mmHg. IAS/Shunts: No atrial level shunt detected by color flow Doppler.  LEFT VENTRICLE PLAX 2D LVIDd:         4.00 cm LVIDs:         3.00 cm LV PW:         1.20 cm LV IVS:        1.10 cm LVOT diam:     2.00 cm LVOT Area:     3.14 cm  IVC IVC diam: 2.20 cm LEFT ATRIUM              Index       RIGHT ATRIUM           Index LA diam:        3.60 cm 2.51 cm/m  RA Area:     16.60 cm LA Vol (A2C):   67.0 ml 46.64 ml/m RA Volume:   39.20 ml  27.29 ml/m LA Vol (A4C):   56.0 ml 38.98 ml/m LA Biplane Vol: 66.9 ml 46.57 ml/m   AORTA Ao Root diam: 3.40 cm Ao Asc diam:  3.20 cm TRICUSPID VALVE TR Peak grad:   37.9 mmHg TR Vmax:        308.00 cm/s  SHUNTS Systemic Diam: 2.00 cm Adrian Prows MD Electronically signed by Adrian Prows MD Signature Date/Time: 01/22/2020/5:34:36 PM    Final     Assessment/Plan Chronic obstructive pulmonary disease, Doing better No rales on exam today Continue on Nebs and Doxycyline  Bilateral LE edema Combination of right ventricular increased pressure as seen on Echo and Cirrhosis Will continue on Demadex Also Wrap legs D/W  the Nurse Repeat BMP pending Atrial fibrillation with rapid ventricular response ([place]) Rate controlled on Metoprolol and Cardizem Also on Eliquis Diarrhea, unspecified type Still issue but better Repeat testing for C. Difficile was negative Using Imodium as needed which is helping If no improvement will need GI consult Mild leukocytosis on CBC Repeat CBC pending Physical deconditioning Discussed with the therapy Patient has decided to stay in SNF  Cirrhosis of liver with mild  ascites,  This was seen in CT scan I discussed this with the daughter patient does have history of using alcohol every day. Per daughter she was Alcoholic whenshe was at home before moving to friend's home Repeat Labs are pending     Labs/tests ordered:  * No order type specified * Next appt:  Visit date not found

## 2020-02-11 ENCOUNTER — Encounter: Payer: Self-pay | Admitting: Internal Medicine

## 2020-02-11 ENCOUNTER — Non-Acute Institutional Stay (SKILLED_NURSING_FACILITY): Payer: Medicare Other | Admitting: Internal Medicine

## 2020-02-11 DIAGNOSIS — I4891 Unspecified atrial fibrillation: Secondary | ICD-10-CM | POA: Diagnosis not present

## 2020-02-11 DIAGNOSIS — R609 Edema, unspecified: Secondary | ICD-10-CM

## 2020-02-11 DIAGNOSIS — R197 Diarrhea, unspecified: Secondary | ICD-10-CM | POA: Diagnosis not present

## 2020-02-11 DIAGNOSIS — J449 Chronic obstructive pulmonary disease, unspecified: Secondary | ICD-10-CM

## 2020-02-11 DIAGNOSIS — F5105 Insomnia due to other mental disorder: Secondary | ICD-10-CM

## 2020-02-11 DIAGNOSIS — L89152 Pressure ulcer of sacral region, stage 2: Secondary | ICD-10-CM | POA: Diagnosis not present

## 2020-02-11 DIAGNOSIS — F418 Other specified anxiety disorders: Secondary | ICD-10-CM

## 2020-02-11 NOTE — Progress Notes (Addendum)
Location:    [facility] Room Number: 28 Place of Service:  SNF [phone]) Provider:  Veleta Miners MD   [name], [name], NP  Patient Care Team: [name], [name], NP as PCP - General (Internal Medicine) Melina Modena, [facility] Netta Cedars, MD as Consulting Physician (Orthopedic Surgery) Luberta Mutter, MD as Consulting Physician (Ophthalmology) Sydnee Levans, MD as Consulting Physician (Dermatology) Adrian Prows, MD as Consulting Physician (Cardiology)  Extended Emergency Contact Information Primary Emergency Contact: [place] Phone: [phone] Mobile Phone: [phone] Relation: Daughter  Code Status:  DNR Goals of care: Advanced Directive information Advanced Directives 02/02/2020  Does Patient Have a Medical Advance Directive? Yes  Type of Paramedic of [place];Living will;Out of facility DNR (pink MOST or yellow form)  Does patient want to make changes to medical advance directive? No - Patient declined  Copy of [place] in Chart? Yes - validated most recent copy scanned in chart (See row information)  Would patient like information on creating a medical advance directive? -  Pre-existing out of facility DNR order (yellow form or pink MOST form) Yellow form placed in chart (order not valid for inpatient use)     Chief Complaint  Patient presents with   Acute Visit    Pressure wound    HPI:  Pt is a 85 y.o. female seen today for an acute visit for Pressure wound  Patient was admitted in the hospital from 5/30-6/7 for diarrhea diagnosed as colitis and A. fib with rapid ventricular response  Patient has h/o Hypertension, Hyperlipidemia, Osteoarthritis, Unstable Gait Wheelchair Dependent,h/o Vaginal Atropy, Macular Degeneration  Seen today for Pressure wound in her Bottom. C/o Pain in that area. Noticed by Nurses.  Other issues Diarrhea much better. SOB and  Bronchitis Doing well. 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 Norris MD   CATARACT EXTRACTION W/ INTRAOCULAR LENS IMPLANT Right 2010   CATARACT EXTRACTION W/ INTRAOCULAR LENS IMPLANT Left 10/2012   DILATION AND CURETTAGE OF UTERUS     EYE SURGERY Right 07/2009   cataract extraction/IOLI [name], MD   KNEE ARTHROSCOPY Right 2008   Torn meniscus, Dr. Veverly Fells    Allergies  Allergen Reactions   Lomotil [Diphenoxylate] Nausea And Vomiting   Sulfa Antibiotics Other (See Comments)    Unknown    Amlodipine Swelling    Allergies as of 02/11/2020      Reactions   Lomotil [diphenoxylate] Nausea And Vomiting   Sulfa Antibiotics Other (See Comments)   Unknown  Amlodipine Swelling      Medication List       Accurate as of February 11, 2020 10:00 AM. If you have any questions, ask your nurse or doctor.        apixaban 2.5 MG Tabs tablet Commonly known as: ELIQUIS Take 1 tablet (2.5 mg total) by mouth 2 (two) times daily.    diltiazem 120 MG 24 hr capsule Commonly known as: CARDIZEM CD Take 1 capsule (120 mg total) by mouth daily.   doxycycline 100 MG capsule Commonly known as: VIBRAMYCIN Take 100 mg by mouth 2 (two) times daily.   guaiFENesin 600 MG 12 hr tablet Commonly known as: MUCINEX Take by mouth 2 (two) times daily.   ipratropium-albuterol 0.5-2.5 (3) MG/3ML Soln Commonly known as: DUONEB Take 3 mLs by nebulization in the morning, at noon, and at bedtime.   loperamide 2 MG capsule Commonly known as: IMODIUM Take 1 capsule (2 mg total) by mouth every 6 (six) hours as needed for diarrhea or loose stools.   metoprolol tartrate 50 MG tablet Commonly known as: LOPRESSOR Take 75 mg by mouth 2 (two) times daily.   mirtazapine 7.5 MG tablet Commonly known as: REMERON Take 7.5 mg by mouth every other day.   multivitamin-lutein Caps capsule Take 1 capsule by mouth in the morning and at bedtime.   potassium chloride SA 20 MEQ tablet Commonly known as: KLOR-CON Take 40 mEq by mouth daily.   saccharomyces boulardii 250 MG capsule Commonly known as: FLORASTOR Take 250 mg by mouth 2 (two) times daily.   torsemide 20 MG tablet Commonly known as: DEMADEX Take 20 mg by mouth daily.   zinc oxide 20 % ointment Apply 1 application topically as needed for irritation.       Review of Systems  Constitutional: Positive for activity change and appetite change.  HENT: Negative.   Respiratory: Negative.   Cardiovascular: Positive for leg swelling.  Gastrointestinal: Negative.   Genitourinary: Negative.   Musculoskeletal: Positive for gait problem.  Skin: Positive for wound.  Neurological: Positive for weakness.  Psychiatric/Behavioral: Positive for dysphoric mood.    Immunization History  Administered Date(s) Administered   Influenza Whole 08/21/2010, 05/21/2012   Influenza, High Dose Seasonal PF 05/30/2017, 06/04/2019   Influenza,inj,Quad PF,6+ Mos 05/23/2018   Influenza-Unspecified  06/04/2014, 05/20/2015, 06/08/2016   Pneumococcal Conjugate-13 08/22/1999   Td 08/22/1999   Zoster 08/21/2005   Pertinent  Health Maintenance Due  Topic Date Due   DEXA SCAN  Never done   PNA vac Low Risk Adult (2 of 2 - PPSV23) 08/21/2000   INFLUENZA VACCINE  03/21/2020   Fall Risk  07/23/2019 01/15/2019 07/17/2018 06/04/2018 09/12/2017  Falls in the past year? 0 0 0 No No  Number falls in past yr: 0 0 0 - -  Injury with Fall? 0 0 0 - -   Functional Status Survey:    Vitals:   02/11/20 0956  BP: 112/74  Pulse: 77  Resp: (!) 22  Temp: 98 F (36.7 C)  SpO2: 93%  Weight: 100 lb 11.2 oz (45.7 kg)  Height: 5\' 2"  (1.575 m)   Body mass index is 18.42 kg/m. Physical Exam Vitals reviewed.  Constitutional:      Comments: Very Frail  HENT:     Head: Normocephalic.     Nose: Nose normal.     Mouth/Throat:     Mouth: Mucous membranes are moist.     Pharynx: Oropharynx is clear.  Eyes:     Pupils:  Pupils are equal, round, and reactive to light.  Cardiovascular:     Rate and Rhythm: Normal rate and regular rhythm.     Pulses: Normal pulses.  Pulmonary:     Effort: Pulmonary effort is normal. No respiratory distress.     Breath sounds: Normal breath sounds. No wheezing or rales.  Abdominal:     General: Abdomen is flat. Bowel sounds are normal.     Palpations: Abdomen is soft.  Musculoskeletal:        General: Swelling present.     Cervical back: Neck supple.  Skin:    General: Skin is warm.     Comments: Has Stage 2 Pressure Ulcer in Sacral area with Necrotic Skin in the center with Redness around it   Neurological:     General: No focal deficit present.     Mental Status: She is alert and oriented to person, place, and time.  Psychiatric:        Mood and Affect: Mood normal.        Thought Content: Thought content normal.     Labs reviewed: Recent Labs    01/24/20 0414 01/24/20 0414 01/25/20 0428 01/26/20 0446 01/29/20 0000  NA 133*   < > 133* 130*  132*   132*  K 3.7   < > 3.6 3.6 4.5   4.5  CL 103   < > 103 101 99   99  CO2 22   < > 21* 22 19   19   GLUCOSE 123*  --  118* 109*  --   BUN 17   < > 15 15 12   12   CREATININE 0.70   < > 0.61 0.66 0.6   0.6  CALCIUM 8.2*   < > 8.0* 7.7* 8.4*   8.4*  MG 1.6*  --  2.1 1.8  --    < > = values in this interval not displayed.   Recent Labs    07/14/19 0820 01/15/20 0840 01/18/20 1302  AST 15 12 14*  ALT 8 7 10   ALKPHOS  --   --  47  BILITOT 0.5 0.7 1.0  PROT 6.1 5.9* 5.8*  ALBUMIN  --   --  3.0*   Recent Labs    01/24/20 0414 01/24/20 0414 01/25/20 0428 01/26/20 0446 01/29/20 0000  WBC 11.8*   < > 12.6* 10.5 14.0   14.0  NEUTROABS 9.6*  --  10.6* 8.6*  --   HGB 11.1*   < > 9.9* 10.0* 12.6   12.6  HCT 35.7*   < > 31.4* 31.6* 0*   38  MCV 88.1  --  87.7 86.8  --   PLT 332   < > 279 255 328   328   < > = values in this interval not displayed.   Lab Results  Component Value Date   TSH 2.195 01/19/2020   No results found for: HGBA1C Lab Results  Component Value Date   CHOL 191 07/14/2019   HDL 65 07/14/2019   LDLCALC 110 (H) 07/14/2019   TRIG 71 07/14/2019   CHOLHDL 2.9 07/14/2019    Significant Diagnostic Results in last 30 days:  CT ABDOMEN PELVIS W CONTRAST  Result Date: 01/20/2020 CLINICAL DATA:  86 year old female with diarrhea. EXAM: CT ABDOMEN AND PELVIS WITH CONTRAST TECHNIQUE: Multidetector CT imaging of the abdomen and pelvis was performed using the standard protocol following bolus administration of intravenous contrast. CONTRAST:  181mL OMNIPAQUE IOHEXOL 300 MG/ML  SOLN COMPARISON:  Abdominal radiograph dated 01/19/2020 FINDINGS: Lower chest: Small bilateral pleural effusions with associated partial compressive atelectasis of the lower lobes. Pneumonia is not excluded. Clinical correlation is recommended. There is mild cardiomegaly. Multi vessel coronary vascular calcification. No intra-abdominal free air. Diffuse mesenteric edema and small perihepatic ascites.  Hepatobiliary: Minimal irregularity of the liver contour may represent early changes of cirrhosis. Clinical correlation is recommended. No intrahepatic biliary ductal dilatation. The gallbladder is unremarkable. Pancreas: Unremarkable. No pancreatic ductal dilatation or surrounding inflammatory changes. Spleen: Normal in size without focal abnormality. Adrenals/Urinary Tract: The adrenal glands are unremarkable. There is no hydronephrosis on either side. There is symmetric enhancement and excretion of contrast by both kidneys. There is a 2 cm left renal interpolar cyst and several subcentimeter hypodensities which are too small to characterize. The visualized ureters and urinary bladder appear unremarkable. Stomach/Bowel: There is scattered sigmoid diverticula. Diffuse thickening of the distal colon primarily involving the rectosigmoid most consistent with colitis. Clinical correlation is recommended. There is no bowel obstruction. The appendix is normal. Vascular/Lymphatic: Advanced aortoiliac atherosclerotic disease. There is a 3.5 cm partially thrombosed infrarenal abdominal aortic aneurysm. The IVC is unremarkable. No portal venous gas. There is no adenopathy. Reproductive: The uterus is grossly unremarkable. Other: Diffuse subcutaneous edema and anasarca. Musculoskeletal: Osteopenia with degenerative changes of the spine. No acute osseous pathology. 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 IMPRESSION: 1. Findings most likely represent colitis and less likely sigmoid diverticulitis. Clinical correlation is recommended. No bowel obstruction. Normal appendix. 2. Small bilateral pleural effusions, small ascites and anasarca. 3. 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 4. A 3.5 cm partially thrombosed infrarenal abdominal aortic aneurysm. 5. Aortic Atherosclerosis (ICD10-I70.0). Electronically Signed   By: Anner Crete M.D.   On: 01/20/2020 20:29   DG Chest Port 1 View  Result Date: 01/19/2020 CLINICAL DATA:  A-fib,  dehydration; EXAM: PORTABLE CHEST 1 VIEW COMPARISON:  Radiograph 11/03/2010 FINDINGS: Normal cardiac silhouette. Lungs are hyperinflated. No effusion, infiltrate pneumothorax. No acute osseous abnormality. IMPRESSION: Hyperinflated lungs.  No acute findings. Electronically Signed   By: Suzy Bouchard M.D.   On: 01/19/2020 09:06   DG Abd Portable 1V  Result Date: 01/19/2020 CLINICAL DATA:  Diarrhea. EXAM: PORTABLE ABDOMEN - 1 VIEW COMPARISON:  None. FINDINGS: The bowel gas pattern is normal. No radio-opaque calculi or other significant radiographic abnormality are seen. Extensive vascular calcification noted. Advanced lumbar spine degenerative changes and chronic avascular necrosis of left hip noted. IMPRESSION: Unremarkable bowel gas pattern. No acute findings. Electronically Signed   By: Marlaine Hind M.D.   On: 01/19/2020 12:12   ECHOCARDIOGRAM COMPLETE  Result Date: 01/22/2020    ECHOCARDIOGRAM REPORT   Patient Name:   Terri Acosta Date of Exam: 01/19/2020 Medical Rec #:  [id number]     Height:       62.0 in Accession #:    [id number]    Weight:       102.1 lb Date of Birth:  01-14-1923     BSA:          1.437 m Patient Age:    70 years      BP:           141/93 mmHg Patient Gender: F             HR:           117 bpm. Exam Location:  Inpatient Procedure: 2D Echo Indications:     atrial fibrillation 427.31  History:  Patient has no prior history of Echocardiogram examinations.                  Risk Factors:Hypertension and Dyslipidemia.  Sonographer:     Johny Chess RDCS Referring Phys:  [id number] [place] Diagnosing Phys: Adrian Prows MD IMPRESSIONS  1. Patient in atrial fibrillation with RVR. Left ventricular ejection fraction, by estimation, is 55 to 60%. The left ventricle has normal function. The left ventricle has no regional wall motion abnormalities. Left ventricular diastolic parameters are indeterminate.  2. Right ventricular systolic function is normal. The right ventricular  size is normal. There is moderately elevated pulmonary artery systolic pressure. The estimated right ventricular systolic pressure is 37.8 mmHg.  3. Left atrial size was moderately dilated.  4. Right atrial size was moderately dilated.  5. Mild restriction of the posterior MV leaflet. There is reverberation artificat in the LA from clacified MV apparatus. . The mitral valve is degenerative. Moderate mitral valve regurgitation. No evidence of mitral stenosis.  6. Tricuspid valve regurgitation is moderate.  7. The aortic valve is tricuspid. Aortic valve regurgitation is not visualized. Mild aortic valve sclerosis is present, with no evidence of aortic valve stenosis.  8. The inferior vena cava is dilated in size with >50% respiratory variability, suggesting right atrial pressure of 8 mmHg. FINDINGS  Left Ventricle: Patient in atrial fibrillation with RVR. Left ventricular ejection fraction, by estimation, is 55 to 60%. The left ventricle has normal function. The left ventricle has no regional wall motion abnormalities. The left ventricular internal  cavity size was normal in size. There is no left ventricular hypertrophy. Left ventricular diastolic parameters are indeterminate. Right Ventricle: The right ventricular size is normal. No increase in right ventricular wall thickness. Right ventricular systolic function is normal. There is moderately elevated pulmonary artery systolic pressure. The tricuspid regurgitant velocity is 3.08 m/s, and with an assumed right atrial pressure of 8 mmHg, the estimated right ventricular systolic pressure is 58.8 mmHg. Left Atrium: Left atrial size was moderately dilated. Right Atrium: Right atrial size was moderately dilated. Pericardium: There is no evidence of pericardial effusion. Mitral Valve: Mild restriction of the posterior MV leaflet. There is reverberation artificat in the LA from clacified MV apparatus. The mitral valve is degenerative in appearance. There is mild thickening  of the mitral valve leaflet(s). There is mild calcification of the mitral valve leaflet(s). Mild mitral annular calcification. Moderate mitral valve regurgitation. No evidence of mitral valve stenosis. Tricuspid Valve: The tricuspid valve is grossly normal. Tricuspid valve regurgitation is moderate. Aortic Valve: The aortic valve is tricuspid. Aortic valve regurgitation is not visualized. Mild aortic valve sclerosis is present, with no evidence of aortic valve stenosis. Pulmonic Valve: The pulmonic valve was grossly normal. Pulmonic valve regurgitation is trivial. Aorta: The aortic root is normal in size and structure. Venous: The inferior vena cava is dilated in size with greater than 50% respiratory variability, suggesting right atrial pressure of 8 mmHg. IAS/Shunts: No atrial level shunt detected by color flow Doppler.  LEFT VENTRICLE PLAX 2D LVIDd:         4.00 cm LVIDs:         3.00 cm LV PW:         1.20 cm LV IVS:        1.10 cm LVOT diam:     2.00 cm LVOT Area:     3.14 cm  IVC IVC diam: 2.20 cm LEFT ATRIUM  Index       RIGHT ATRIUM           Index LA diam:        3.60 cm 2.51 cm/m  RA Area:     16.60 cm LA Vol (A2C):   67.0 ml 46.64 ml/m RA Volume:   39.20 ml  27.29 ml/m LA Vol (A4C):   56.0 ml 38.98 ml/m LA Biplane Vol: 66.9 ml 46.57 ml/m   AORTA Ao Root diam: 3.40 cm Ao Asc diam:  3.20 cm TRICUSPID VALVE TR Peak grad:   37.9 mmHg TR Vmax:        308.00 cm/s  SHUNTS Systemic Diam: 2.00 cm Adrian Prows MD Electronically signed by Adrian Prows MD Signature Date/Time: 01/22/2020/5:34:36 PM    Final     Assessment/Plan Pressure injury of sacral region, stage 2 (HCC) Air Mattress and pressure reducing pillow for her chair Santyl for the necrotic area Don't think it needs santyl yet Skin prep for the redness Ensure QD  Chronic obstructive pulmonary disease, unspecified COPD type ([place]) Doing well Doxy finishing today and Nebs are done also  Atrial fibrillation with rapid ventricular  response (HCC) On Eliquis Rate controlled  Diarrhea, unspecified type Much Improved Per Patient  Edema, unspecified type Much better  BMP pending Reduce the Demadex to 20 mg QOD  Insomnia secondary to depression with anxiety Doing well on Remeron    Family/ staff Communication:   Labs/tests ordered:

## 2020-02-12 LAB — COMPREHENSIVE METABOLIC PANEL
Albumin: 2.9 — AB (ref 3.5–5.0)
Calcium: 8.2 — AB (ref 8.7–10.7)
Globulin: 1.9

## 2020-02-12 LAB — BASIC METABOLIC PANEL
BUN: 23 — AB (ref 4–21)
CO2: 41 — AB (ref 13–22)
Chloride: 93 — AB (ref 99–108)
Creatinine: 0.7 (ref 0.5–1.1)
Glucose: 99
Potassium: 3.7 (ref 3.4–5.3)
Sodium: 141 (ref 137–147)

## 2020-02-12 LAB — HEPATIC FUNCTION PANEL
ALT: 20 (ref 7–35)
AST: 20 (ref 13–35)
Alkaline Phosphatase: 47 (ref 25–125)
Bilirubin, Total: 0.8

## 2020-02-12 LAB — CBC AND DIFFERENTIAL
HCT: 31 — AB (ref 36–46)
Hemoglobin: 10.1 — AB (ref 12.0–16.0)
Neutrophils Absolute: 5105
Platelets: 188 (ref 150–399)
WBC: 6.7

## 2020-02-12 LAB — CBC: RBC: 3.53 — AB (ref 3.87–5.11)

## 2020-02-12 NOTE — Progress Notes (Deleted)
Cardiology Office Note   Date:  02/12/2020   ID:  Terri Acosta, DOB [birth year], MRN [id number]  PCP:  [name], [name], NP  Cardiologist:   No primary care provider on file. Referring:  ***  No chief complaint on file.     History of Present Illness: Terri Acosta is a 86 y.o. female who presents for ***     Past Medical History:  Diagnosis Date  . Abnormal uterine bleeding   . Actinic keratosis 02/13/2012  . Acute bronchospasm 10/31/2011  . Cancer ([place]) 04/2008   Skin cancer of low back [name], MD  . Cataract   . Disorder of bone and cartilage, unspecified 02/18/2007  . Dizziness and giddiness 08/30/2010  . Dysmenorrhea   . Edema 12/19/2011  . Endometriosis   . 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 Osteoarthrosis, unspecified whether generalized or localized, unspecified site 08/20/2006  . Osteoporosis   . Other abnormal blood chemistry 03/14/2011  . Other and unspecified hyperlipidemia 10/18/2010  . Palpitations 02/13/2012  . Postmenopausal bleeding 08/30/2010  . Shortness of breath 11/28/2011  . Supraventricular premature beats 05/23/2011  . Unspecified essential hypertension 08/20/2006    Past Surgical History:  Procedure Laterality Date  . CARPAL TUNNEL RELEASE  2004   S. Norris MD  . CATARACT EXTRACTION W/ INTRAOCULAR LENS IMPLANT Right 2010  . CATARACT EXTRACTION W/ INTRAOCULAR LENS IMPLANT Left 10/2012  . DILATION AND CURETTAGE OF UTERUS    . EYE SURGERY Right 07/2009   cataract extraction/IOLI [name], MD  . KNEE ARTHROSCOPY Right 2008   Torn meniscus, Dr. Veverly Fells     Current Outpatient Medications  Medication Sig Dispense Refill  . apixaban (ELIQUIS) 2.5 MG TABS tablet Take 1 tablet (2.5 mg total) by mouth 2 (two) times daily. 60 tablet 0  . diltiazem (CARDIZEM CD) 120 MG 24 hr capsule Take 1 capsule (120 mg total) by mouth daily. 30 capsule 0  . ipratropium-albuterol (DUONEB) 0.5-2.5 (3) MG/3ML SOLN Take 3 mLs by nebulization in the morning,  at noon, and at bedtime.    [name] loperamide (IMODIUM) 2 MG capsule Take 1 capsule (2 mg total) by mouth every 6 (six) hours as needed for diarrhea or loose stools. 10 capsule 0  . metoprolol tartrate (LOPRESSOR) 50 MG tablet Take 75 mg by mouth 2 (two) times daily.    . mirtazapine (REMERON) 7.5 MG tablet Take 7.5 mg by mouth every other day.     . multivitamin-lutein (OCUVITE-LUTEIN) CAPS capsule Take 1 capsule by mouth in the morning and at bedtime.     . potassium chloride SA (KLOR-CON) 20 MEQ tablet Take 40 mEq by mouth daily.    [name] saccharomyces boulardii (FLORASTOR) 250 MG capsule Take 250 mg by mouth 2 (two) times daily.    [name] torsemide (DEMADEX) 20 MG tablet Take 20 mg by mouth daily.    [name] zinc oxide 20 % ointment Apply 1 application topically as needed for irritation.     No current facility-administered medications for this visit.    Allergies:   Lomotil [diphenoxylate], Sulfa antibiotics, and Amlodipine    Social History:  The patient  reports that she quit smoking about 42 years ago. She has never used smokeless tobacco. She reports current alcohol use. She reports that she does not use drugs.   Family History:  The patient's ***family history includes COPD in her brother; Diabetes in her brother.    ROS:  Please see the history of present illness.  Otherwise, review of systems are positive for {NONE DEFAULTED:18576::"none"}.   All other systems are reviewed and negative.    PHYSICAL EXAM: VS:  There were no vitals taken for this visit. , BMI There is no height or weight on file to calculate BMI. GENERAL:  Well appearing HEENT:  Pupils equal round and reactive, fundi not visualized, oral mucosa unremarkable NECK:  No jugular venous distention, waveform within normal limits, carotid upstroke brisk and symmetric, no bruits, no thyromegaly LYMPHATICS:  No cervical, inguinal adenopathy LUNGS:  Clear to auscultation bilaterally BACK:  No CVA tenderness CHEST:  Unremarkable HEART:   PMI not displaced or sustained,S1 and S2 within normal limits, no S3, no S4, no clicks, no rubs, *** murmurs ABD:  Flat, positive bowel sounds normal in frequency in pitch, no bruits, no rebound, no guarding, no midline pulsatile mass, no hepatomegaly, no splenomegaly EXT:  2 plus pulses throughout, no edema, no cyanosis no clubbing SKIN:  No rashes no nodules NEURO:  Cranial nerves II through XII grossly intact, motor grossly intact throughout PSYCH:  Cognitively intact, oriented to person place and time    EKG:  EKG {ACTION; IS/IS EPP:[id number]} ordered today. The ekg ordered today demonstrates ***   Recent Labs: 01/18/2020: ALT 10 01/19/2020: TSH 2.195 01/26/2020: Magnesium 1.8 01/29/2020: BUN 12; BUN 12; Creatinine 0.6; Creatinine 0.6; Hemoglobin 12.6; Hemoglobin 12.6; Platelets 328; Platelets 328; Potassium 4.5; Potassium 4.5; Sodium 132; Sodium 132    Lipid Panel    Component Value Date/Time   CHOL 191 07/14/2019 0820   TRIG 71 07/14/2019 0820   HDL 65 07/14/2019 0820   CHOLHDL 2.9 07/14/2019 0820   VLDL 11 06/29/2016 0000   LDLCALC 110 (H) 07/14/2019 0820      Wt Readings from Last 3 Encounters:  02/11/20 100 lb 11.2 oz (45.7 kg)  02/09/20 101 lb (45.8 kg)  02/06/20 104 lb 6.4 oz (47.4 kg)      Other studies Reviewed: Additional studies/ records that were reviewed today include: ***. Review of the above records demonstrates:  Please see elsewhere in the note.  ***   ASSESSMENT AND PLAN:  ***   Current medicines are reviewed at length with the patient today.  The patient {ACTIONS; HAS/DOES NOT HAVE:19233} concerns regarding medicines.  The following changes have been made:  {PLAN; NO CHANGE:13088:s}  Labs/ tests ordered today include: *** No orders of the defined types were placed in this encounter.    Disposition:   FU with ***    Signed, Minus Breeding, MD  02/12/2020 5:46 PM    [facility]  '

## 2020-02-13 ENCOUNTER — Ambulatory Visit: Payer: Medicare Other | Admitting: Cardiology

## 2020-02-24 ENCOUNTER — Encounter: Payer: Self-pay | Admitting: Nurse Practitioner

## 2020-02-24 ENCOUNTER — Non-Acute Institutional Stay (SKILLED_NURSING_FACILITY): Payer: Medicare Other | Admitting: Nurse Practitioner

## 2020-02-24 DIAGNOSIS — I4891 Unspecified atrial fibrillation: Secondary | ICD-10-CM | POA: Diagnosis not present

## 2020-02-24 DIAGNOSIS — J449 Chronic obstructive pulmonary disease, unspecified: Secondary | ICD-10-CM | POA: Diagnosis not present

## 2020-02-24 DIAGNOSIS — R609 Edema, unspecified: Secondary | ICD-10-CM | POA: Diagnosis not present

## 2020-02-24 DIAGNOSIS — F418 Other specified anxiety disorders: Secondary | ICD-10-CM

## 2020-02-24 DIAGNOSIS — L89153 Pressure ulcer of sacral region, stage 3: Secondary | ICD-10-CM

## 2020-02-24 DIAGNOSIS — L8990 Pressure ulcer of unspecified site, unspecified stage: Secondary | ICD-10-CM | POA: Insufficient documentation

## 2020-02-24 DIAGNOSIS — F5105 Insomnia due to other mental disorder: Secondary | ICD-10-CM

## 2020-02-24 NOTE — Progress Notes (Signed)
Cardiology Office Note:    Date:  02/28/2020   ID:  Terri Acosta, DOB Nov 10, 1922, MRN [id number]  PCP:  [name], [name], NP  Cardiologist:  No primary care provider on file.  Electrophysiologist:  None   Referring MD: [name], [name], NP   Chief Complaint  Patient presents with  . Atrial Fibrillation    History of Present Illness:    Terri Acosta is a 86 y.o. female with a hx of paroxysmal atrial fibrillation, hypertension, asthma who presents as a hospital follow-up.  She was admitted to [facility] on 01/18/2020 with diarrhea, weakness, and atrial fibrillation with RVR.  Cardiology was consulted.  She reported that she was diagnosed with atrial fibrillation 6 or 7 years prior.  Had not seen a cardiologist, has been managed by PCP.  She had not been started on anticoagulation.  Given CHA2DS2-VASc score 4 (age x2, female, hypertension), and no history of bleeding or falls, discussed risks and benefits of anticoagulation with patient and she was agreeable to starting anticoagulation.  She was started on Eliquis 2.5 mg twice daily given her age/weight.  Echocardiogram on 01/19/2020 showed normal biventricular function, moderate biatrial dilatation, moderate MR/TR, RVSP 46 mmHg.  She was discharged on metoprolol 100 mg twice daily and Cardizem 120 mg daily for rate control.  Since her hospitalization, she reports that she is now in skilled nursing (previously had been in independent living).  She was previously independent and mobile, but now needs help getting up and using her walker.  She denies any falls or bleeding, but states she is concerned about falling now that her mobility has worsened.  She denies any chest pain.  Reports her shortness of breath has improved.  She denies any lightheadedness, syncope, or palpitations.  She had weight gain after her hospitalization, was started on torsemide 20 mg every other day.  She is taking Eliquis 2.5 mg daily.   Wt Readings from Last 3 Encounters:    02/25/20 99 lb (44.9 kg)  02/24/20 99 lb (44.9 kg)  02/11/20 100 lb 11.2 oz (45.7 kg)     Past Medical History:  Diagnosis Date  . Abnormal uterine bleeding   . Actinic keratosis 02/13/2012  . Acute bronchospasm 10/31/2011  . Cancer ([place]) 04/2008   Skin cancer of low back [name], MD  . Cataract   . Disorder of bone and cartilage, unspecified 02/18/2007  . Dizziness and giddiness 08/30/2010  . Dysmenorrhea   . Edema 12/19/2011  . Endometriosis   . 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 Osteoarthrosis, unspecified whether generalized or localized, unspecified site 08/20/2006  . Osteoporosis   . Other abnormal blood chemistry 03/14/2011  . Other and unspecified hyperlipidemia 10/18/2010  . Palpitations 02/13/2012  . Postmenopausal bleeding 08/30/2010  . Shortness of breath 11/28/2011  . Supraventricular premature beats 05/23/2011  . Unspecified essential hypertension 08/20/2006    Past Surgical History:  Procedure Laterality Date  . CARPAL TUNNEL RELEASE  2004   S. Norris MD  . CATARACT EXTRACTION W/ INTRAOCULAR LENS IMPLANT Right 2010  . CATARACT EXTRACTION W/ INTRAOCULAR LENS IMPLANT Left 10/2012  . DILATION AND CURETTAGE OF UTERUS    . EYE SURGERY Right 07/2009   cataract extraction/IOLI [name], MD  . KNEE ARTHROSCOPY Right 2008   Torn meniscus, Dr. Veverly Fells    Current Medications: Current Meds  Medication Sig  . diltiazem (CARDIZEM CD) 120 MG 24 hr capsule Take 1 capsule (120 mg total) by mouth daily.  [name] loperamide (IMODIUM)  2 MG capsule Take 1 capsule (2 mg total) by mouth every 6 (six) hours as needed for diarrhea or loose stools.  . metoprolol tartrate (LOPRESSOR) 50 MG tablet Take 75 mg by mouth 2 (two) times daily.  . mirtazapine (REMERON) 7.5 MG tablet Take 7.5 mg by mouth every other day.   . multivitamin-lutein (OCUVITE-LUTEIN) CAPS capsule Take 1 capsule by mouth in the morning and at bedtime.   . potassium chloride SA (KLOR-CON) 20 MEQ tablet Take  40 mEq by mouth every other day.   . saccharomyces boulardii (FLORASTOR) 250 MG capsule Take 250 mg by mouth 2 (two) times daily.  [name] torsemide (DEMADEX) 20 MG tablet Take 20 mg by mouth every other day.   . zinc oxide 20 % ointment Apply 1 application topically as needed for irritation.  . [DISCONTINUED] apixaban (ELIQUIS) 2.5 MG TABS tablet Take 1 tablet (2.5 mg total) by mouth 2 (two) times daily.     Allergies:   Lomotil [diphenoxylate], Sulfa antibiotics, and Amlodipine   Social History   Socioeconomic History  . Marital status: Widowed    Spouse name: Not on file  . Number of children: Not on file  . Years of education: Not on file  . Highest education level: Not on file  Occupational History  . Not on file  Tobacco Use  . Smoking status: Former Smoker    Quit date: 02/11/1978    Years since quitting: 42.0  . Smokeless tobacco: Never Used  Vaping Use  . Vaping Use: Never used  Substance and Sexual Activity  . Alcohol use: Yes  . Drug use: No  . Sexual activity: Not Currently  Other Topics Concern  . Not on file  Social History Narrative   Lives alone in a apartment at [facility] in the independent living area since 2007   The patient is not exercising regularly, walking with walker   No specific diet.   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   She has a living will, POA   Former smoker, stopped 1979   Alcohol - one glass of wine at night   Social Determinants of Health   Financial Resource Strain:   . Difficulty of Paying Living Expenses:   Food Insecurity:   . Worried About Charity fundraiser in the Last Year:   . Arboriculturist in the Last Year:   Transportation Needs:   . Film/video editor (Medical):   [name] Lack of Transportation (Non-Medical):   Physical Activity:   . Days of Exercise per Week:   . Minutes of Exercise per Session:   Stress:   . Feeling of Stress :   Social Connections:   . Frequency of Communication with Friends and  Family:   . Frequency of Social Gatherings with Friends and Family:   . Attends Religious Services:   . Active Member of Clubs or Organizations:   . Attends Archivist Meetings:   [name] Marital Status:      Family History: The patient's family history includes COPD in her brother; Diabetes in her brother.  ROS:   Please see the history of present illness.     All other systems reviewed and are negative.  EKGs/Labs/Other Studies Reviewed:    The following studies were reviewed today:   EKG:  EKG is  ordered today.  The ekg ordered today demonstrates atrial fibrillation, rate 101, low voltage, poor R wave progression  Recent Labs: 01/19/2020: TSH 2.195  01/26/2020: Magnesium 1.8 02/12/2020: ALT 20; BUN 23; Creatinine 0.7; Hemoglobin 10.1; Platelets 188; Potassium 3.7; Sodium 141  Recent Lipid Panel    Component Value Date/Time   CHOL 191 07/14/2019 0820   TRIG 71 07/14/2019 0820   HDL 65 07/14/2019 0820   CHOLHDL 2.9 07/14/2019 0820   VLDL 11 06/29/2016 0000   LDLCALC 110 (H) 07/14/2019 0820    Physical Exam:    VS:  BP 117/75   Pulse (!) 101   Temp (!) 94.6 F (34.8 C)   Ht 5' (1.524 m)   Wt 99 lb (44.9 kg)   SpO2 99%   BMI 19.33 kg/m     Wt Readings from Last 3 Encounters:  02/25/20 99 lb (44.9 kg)  02/24/20 99 lb (44.9 kg)  02/11/20 100 lb 11.2 oz (45.7 kg)     GEN: frail, in no acute distress HEENT: Normal NECK: No JVD; No carotid bruits CARDIAC: irregular, no murmurs, rubs, gallops RESPIRATORY:  Clear to auscultation without rales, wheezing or rhonchi  ABDOMEN: Soft, non-tender, non-distended MUSCULOSKELETAL:  BLE wrapped SKIN: Warm and dry NEUROLOGIC:  Alert and oriented x 3 PSYCHIATRIC:  Normal affect   ASSESSMENT:    1. Atrial fibrillation, unspecified type ([place])   2. Lower extremity edema   3. Medication management   4. Mitral valve insufficiency, unspecified etiology   5. Essential hypertension    PLAN:    Atrial fibrillation:  CHA2DS2-VASc score 4 (age x2, female, hypertension). Echocardiogram on 01/19/2020 showed normal biventricular function, moderate biatrial dilatation, moderate MR/TR, RVSP 46 mmHg. -Continue metoprolol 100 mg twice daily and Cardizem 120 mg daily for rate control.  Will check Zio patch x3 days to monitor rate control -Currently on Eliquis 2.5 mg twice daily for rate control.  This was started during her recent hospitalization for atrial fibrillation.  Prior to hospitalization she had been very independent and mobile and had not had any falls, nor was she concerned about having falls.  Since her hospitalization, she reports that she needs help getting up from a seated position and feels unstable on her feet.  She is worried about having a fall.  We discussed the risks and benefits of Eliquis, that it reduces stroke risk but does increase her bleeding risk.  She would prefer to discontinue the Eliquis, as she is concerned about having a fall while on anticoagulation.  Will discontinue Eliquis.  Miitral regurgitation: Moderate on recent echo.  Moderate left atrial dilatation, suspect atrial functional MR in setting of atrial fibrillation  Hypertension: On Lopressor and Cardizem as above.  Appears controlled  LE edema: Reports has improved since starting torsemide 20 mg every other day at her facility.  Will check BNP, CMP, magnesium.  RTC in 3 months  Medication Adjustments/Labs and Tests Ordered: Current medicines are reviewed at length with the patient today.  Concerns regarding medicines are outlined above.  Orders Placed This Encounter  Procedures  . Comprehensive metabolic panel  . Magnesium  . Brain natriuretic peptide  . LONG TERM MONITOR (3-14 DAYS)  . EKG 12-Lead   No orders of the defined types were placed in this encounter.   Patient Instructions  Medication Instructions:  STOP Eliquis  *If you need a refill on your cardiac medications before your next appointment, please call your  pharmacy*   Lab Work: CMET, Mag, BNP (orders provided)  If you have labs (blood work) drawn today and your tests are completely normal, you will receive your results only by: [name] MyChart  Message (if you have MyChart) OR . A paper copy in the mail If you have any lab test that is abnormal or we need to change your treatment, we will call you to review the results.   Testing/Procedures:  [facility]- Long Term Monitor Instructions   Your physician has requested you wear your ZIO patch monitor 3 days.   This is a single patch monitor.  [employer] supplies one patch monitor per enrollment.  Additional stickers are not available.   Please do not apply patch if you will be having a Nuclear Stress Test, Echocardiogram, Cardiac CT, MRI, or Chest Xray during the time frame you would be wearing the monitor. The patch cannot be worn during these tests.  You cannot remove and re-apply the ZIO XT patch monitor.   Your ZIO patch monitor will be sent USPS Priority mail from [facility] directly to your home address. The monitor may also be mailed to a PO BOX if home delivery is not available.   It may take 3-5 days to receive your monitor after you have been enrolled.   Once you have received you monitor, please review enclosed instructions.  Your monitor has already been registered assigning a specific monitor serial # to you.   Applying the monitor   Shave hair from upper left chest.   Hold abrader disc by orange tab.  Rub abrader in 40 strokes over left upper chest as indicated in your monitor instructions.   Clean area with 4 enclosed alcohol pads .  Use all pads to assure are is cleaned thoroughly.  Let dry.   Apply patch as indicated in monitor instructions.  Patch will be place under collarbone on left side of chest with arrow pointing upward.   Rub patch adhesive wings for 2 minutes.Remove white label marked "1".  Remove white label marked "2".  Rub patch adhesive wings for 2 additional  minutes.   While looking in a mirror, press and release button in center of patch.  A small green light will flash 3-4 times .  This will be your only indicator the monitor has been turned on.     Do not shower for the first 24 hours.  You may shower after the first 24 hours.   Press button if you feel a symptom. You will hear a small click.  Record Date, Time and Symptom in the Patient Log Book.   When you are ready to remove patch, follow instructions on last 2 pages of Patient Log Book.  Stick patch monitor onto last page of Patient Log Book.   Place Patient Log Book in [place] box.  Use locking tab on box and tape box closed securely.  The [employer] and [employer] has [employer] on it.  Please place in mailbox as soon as possible.  Your physician should have your test results approximately 7 days after the monitor has been mailed back to [facility].   Call [place] at [phone] if you have questions regarding your ZIO XT patch monitor.  Call them immediately if you see an orange light blinking on your monitor.   If your monitor falls off in less than 4 days contact our Monitor department at [phone].  If your monitor becomes loose or falls off after 4 days call [employer] at [phone] for suggestions on securing your monitor.     Follow-Up: At [facility], you and your health needs are our priority.  As part of our continuing mission to  provide you with exceptional heart care, we have created designated Provider Care Teams.  These Care Teams include your primary Cardiologist (physician) and Advanced Practice Providers (APPs -  Physician Assistants and Nurse Practitioners) who all work together to provide you with the care you need, when you need it.  We recommend signing up for the patient portal called "MyChart".  Sign up information is provided on this After Visit Summary.  MyChart is used to connect with patients for Virtual Visits (Telemedicine).   Patients are able to view lab/test results, encounter notes, upcoming appointments, etc.  Non-urgent messages can be sent to your provider as well.   To learn more about what you can do with MyChart, go to [email or web address].    Your next appointment:   3 month(s)  The format for your next appointment:   In Person  Provider:   Oswaldo Milian, MD      Signed, Donato Heinz, MD  02/28/2020 9:01 PM    [place]

## 2020-02-24 NOTE — Assessment & Plan Note (Signed)
Heart rate is in control, continue Metoprolol, Diltiazem

## 2020-02-24 NOTE — Assessment & Plan Note (Signed)
Stable, continue O2

## 2020-02-24 NOTE — Assessment & Plan Note (Addendum)
sacral pressure wound(stage III), crater noted, peri wound superficial skin missing in the reddened skin(state II), air mattress, gel cushion are in place, no purulent drainage. Yellow slough at wound bed noted.  Will pack the crater wound with saline gauze, apply Santyl to yellow sloughed wound bed,  cover with hydrocolloid dressing including stage II superficial skin missing areas every 2 days Wound care center referral.  Continue pressure reduction, air mattress, gel cushion, frequent re positioning.

## 2020-02-24 NOTE — Progress Notes (Signed)
Location:    [facility] Room Number: 9 Place of Service:  SNF (31) Provider: Marlana Latus NP  Rorey Hodges X, NP  Patient Care Team: Benen Weida X, NP as PCP - General (Internal Medicine) Melina Modena, [facility] Netta Cedars, MD as Consulting Physician (Orthopedic Surgery) Luberta Mutter, MD as Consulting Physician (Ophthalmology) Sydnee Levans, MD as Consulting Physician (Dermatology) Adrian Prows, MD as Consulting Physician (Cardiology)  Extended Emergency Contact Information Primary Emergency Contact: [place] Phone: [phone] Mobile Phone: [phone] Relation: Daughter  Code Status:  DNR Goals of care: Advanced Directive information Advanced Directives 02/02/2020  Does Patient Have a Medical Advance Directive? Yes  Type of Paramedic of [place];Living will;Out of facility DNR (pink MOST or yellow form)  Does patient want to make changes to medical advance directive? No - Patient declined  Copy of [place] in Chart? Yes - validated most recent copy scanned in chart (See row information)  Would patient like information on creating a medical advance directive? -  Pre-existing out of facility DNR order (yellow form or pink MOST form) Yellow form placed in chart (order not valid for inpatient use)     Chief Complaint  Patient presents with  . Acute Visit    Pressure ulcer    HPI:  Pt is a 86 y.o. female seen today for an acute visit for sacral pressure wound, crater noted, peri wound superficial skin missing in the reddened skin, air mattress, gel cushion are in place, no purulent drainage. Yellow slough at wound bed noted.     COPD O2  Afib, takes Eliquis, Diltiazem, Metoprolol.   Edema, chronic, takes Torsemide qod  Insomnia, takes Mirtazapine.   Past Medical History:  Diagnosis Date  . Abnormal uterine bleeding   . Actinic keratosis 02/13/2012  . Acute  bronchospasm 10/31/2011  . Cancer ([place]) 04/2008   Skin cancer of low back [name], MD  . Cataract   . Disorder of bone and cartilage, unspecified 02/18/2007  . Dizziness and giddiness 08/30/2010  . Dysmenorrhea   . Edema 12/19/2011  . Endometriosis   . 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 Osteoarthrosis, unspecified whether generalized or localized, unspecified site 08/20/2006  . Osteoporosis   . Other abnormal blood chemistry 03/14/2011  . Other and unspecified hyperlipidemia 10/18/2010  . Palpitations 02/13/2012  . Postmenopausal bleeding 08/30/2010  . Shortness of breath 11/28/2011  . Supraventricular premature beats 05/23/2011  . Unspecified essential hypertension 08/20/2006   Past Surgical History:  Procedure Laterality Date  . CARPAL TUNNEL RELEASE  2004   S. Norris MD  . CATARACT EXTRACTION W/ INTRAOCULAR LENS IMPLANT Right 2010  . CATARACT EXTRACTION W/ INTRAOCULAR LENS IMPLANT Left 10/2012  . DILATION AND CURETTAGE OF UTERUS    . EYE SURGERY Right 07/2009   cataract extraction/IOLI [name], MD  . KNEE ARTHROSCOPY Right 2008   Torn meniscus, Dr. Veverly Fells    Allergies  Allergen Reactions  . Lomotil [Diphenoxylate] Nausea And Vomiting  . Sulfa Antibiotics Other (See Comments)    Unknown   . Amlodipine Swelling    Allergies as of 02/24/2020      Reactions   Lomotil [diphenoxylate] Nausea And Vomiting   Sulfa Antibiotics Other (See Comments)   Unknown    Amlodipine Swelling      Medication List       Accurate as of February 24, 2020  5:03 PM. If you have any questions, ask your nurse or doctor.  STOP taking these medications   ipratropium-albuterol 0.5-2.5 (3) MG/3ML Soln Commonly known as: DUONEB Stopped by: Santana Gosdin X Georgina Krist, NP     TAKE these medications   apixaban 2.5 MG Tabs tablet Commonly known as: ELIQUIS Take 1 tablet (2.5 mg total) by mouth 2 (two) times daily.   diltiazem 120 MG 24 hr capsule Commonly known as: CARDIZEM CD Take 1 capsule  (120 mg total) by mouth daily.   loperamide 2 MG capsule Commonly known as: IMODIUM Take 1 capsule (2 mg total) by mouth every 6 (six) hours as needed for diarrhea or loose stools.   metoprolol tartrate 50 MG tablet Commonly known as: LOPRESSOR Take 75 mg by mouth 2 (two) times daily.   mirtazapine 7.5 MG tablet Commonly known as: REMERON Take 7.5 mg by mouth every other day.   multivitamin-lutein Caps capsule Take 1 capsule by mouth in the morning and at bedtime.   potassium chloride SA 20 MEQ tablet Commonly known as: KLOR-CON Take 40 mEq by mouth every other day.   saccharomyces boulardii 250 MG capsule Commonly known as: FLORASTOR Take 250 mg by mouth 2 (two) times daily.   torsemide 20 MG tablet Commonly known as: DEMADEX Take 20 mg by mouth every other day.   zinc oxide 20 % ointment Apply 1 application topically as needed for irritation.       Review of Systems  Constitutional: Negative for activity change, appetite change and fever.  HENT: Positive for hearing loss. Negative for congestion, trouble swallowing and voice change.   Eyes: Negative for visual disturbance.  Respiratory: Positive for cough and shortness of breath. Negative for chest tightness and wheezing.        Chronic, uses O2  Cardiovascular: Positive for leg swelling. Negative for chest pain and palpitations.  Gastrointestinal: Positive for diarrhea. Negative for abdominal pain, constipation, nausea and vomiting.  Genitourinary: Negative for difficulty urinating, dysuria and urgency.  Musculoskeletal: Positive for gait problem.  Skin: Positive for wound. Negative for color change.  Neurological: Negative for dizziness, speech difficulty, weakness and headaches.  Psychiatric/Behavioral: Positive for sleep disturbance. Negative for confusion. The patient is not nervous/anxious.     Immunization History  Administered Date(s) Administered  . Influenza Whole 08/21/2010, 05/21/2012  . Influenza,  High Dose Seasonal PF 05/30/2017, 06/04/2019  . Influenza,inj,Quad PF,6+ Mos 05/23/2018  . Influenza-Unspecified 06/04/2014, 05/20/2015, 06/08/2016  . Pneumococcal Conjugate-13 08/22/1999  . Td 08/22/1999  . Zoster 08/21/2005   Pertinent  Health Maintenance Due  Topic Date Due  . DEXA SCAN  Never done  . PNA vac Low Risk Adult (2 of 2 - PPSV23) 08/21/2000  . INFLUENZA VACCINE  03/21/2020   Fall Risk  07/23/2019 01/15/2019 07/17/2018 06/04/2018 09/12/2017  Falls in the past year? 0 0 0 No No  Number falls in past yr: 0 0 0 - -  Injury with Fall? 0 0 0 - -   Functional Status Survey:    Vitals:   02/24/20 1633  BP: 116/81  Pulse: 82  Resp: 16  Temp: (!) 97.3 F (36.3 C)  SpO2: 96%  Weight: 99 lb (44.9 kg)  Height: 5\' 2"  (1.575 m)   Body mass index is 18.11 kg/m. Physical Exam Vitals and nursing note reviewed.  Constitutional:      Appearance: Normal appearance.  HENT:     Head: Normocephalic and atraumatic.     Mouth/Throat:     Mouth: Mucous membranes are moist.  Eyes:     Extraocular Movements: Extraocular movements  intact.     Conjunctiva/sclera: Conjunctivae normal.     Pupils: Pupils are equal, round, and reactive to light.     Comments: Crusted eyelashes, left eye low vision  Cardiovascular:     Rate and Rhythm: Normal rate. Rhythm irregular.     Heart sounds: No murmur heard.      Comments: Weak DP pulses.  Pulmonary:     Breath sounds: Rhonchi present. No wheezing or rales.  Abdominal:     General: Bowel sounds are normal. There is no distension.     Palpations: Abdomen is soft.     Tenderness: There is no abdominal tenderness. There is no guarding or rebound.  Musculoskeletal:     Cervical back: Normal range of motion and neck supple.     Right lower leg: Edema present.     Left lower leg: Edema present.     Comments: 1-2 edema BLE  Skin:    General: Skin is warm and dry.     Comments: sacral pressure wound(stage III), crater noted, peri wound  superficial skin missing in the reddened skin(state II), air mattress, gel cushion are in place, no purulent drainage. Yellow slough at wound bed noted.    Neurological:     General: No focal deficit present.     Mental Status: She is alert and oriented to person, place, and time. Mental status is at baseline.     Gait: Gait abnormal.  Psychiatric:        Mood and Affect: Mood normal.        Behavior: Behavior normal.        Thought Content: Thought content normal.        Judgment: Judgment normal.     Labs reviewed: Recent Labs    01/24/20 0414 01/24/20 0414 01/25/20 0428 01/25/20 0428 01/26/20 0446 01/29/20 0000 02/12/20 0000  NA 133*   < > 133*   < > 130* 132*  132* 141  K 3.7   < > 3.6   < > 3.6 4.5  4.5 3.7  CL 103   < > 103   < > 101 99  99 93*  CO2 22   < > 21*   < > 22 19  19  41*  GLUCOSE 123*  --  118*  --  109*  --   --   BUN 17   < > 15   < > 15 12  12  23*  CREATININE 0.70   < > 0.61   < > 0.66 0.6  0.6 0.7  CALCIUM 8.2*   < > 8.0*   < > 7.7* 8.4*  8.4* 8.2*  MG 1.6*  --  2.1  --  1.8  --   --    < > = values in this interval not displayed.   Recent Labs    07/14/19 0820 07/14/19 0820 01/15/20 0840 01/18/20 1302 02/12/20 0000  AST 15   < > 12 14* 20  ALT 8   < > 7 10 20   ALKPHOS  --   --   --  47 47  BILITOT 0.5  --  0.7 1.0  --   PROT 6.1  --  5.9* 5.8*  --   ALBUMIN  --   --   --  3.0* 2.9*   < > = values in this interval not displayed.   Recent Labs    01/24/20 0414 01/24/20 0414 01/25/20 0428 01/25/20 0428 01/26/20 0446 01/29/20 0000 02/12/20 0000  WBC 11.8*   < > 12.6*   < > 10.5 14.0  14.0 6.7  NEUTROABS 9.6*   < > 10.6*  --  8.6*  --  5,105  HGB 11.1*   < > 9.9*   < > 10.0* 12.6  12.6 10.1*  HCT 35.7*   < > 31.4*   < > 31.6* 0*  38 31*  MCV 88.1  --  87.7  --  86.8  --   --   PLT 332   < > 279   < > 255 328  328 188   < > = values in this interval not displayed.   Lab Results  Component Value Date   TSH 2.195 01/19/2020    No results found for: HGBA1C Lab Results  Component Value Date   CHOL 191 07/14/2019   HDL 65 07/14/2019   LDLCALC 110 (H) 07/14/2019   TRIG 71 07/14/2019   CHOLHDL 2.9 07/14/2019    Significant Diagnostic Results in last 30 days:  No results found.  Assessment/Plan Pressure ulcer sacral pressure wound(stage III), crater noted, peri wound superficial skin missing in the reddened skin(state II), air mattress, gel cushion are in place, no purulent drainage. Yellow slough at wound bed noted.  Will pack the crater wound with saline gauze, apply Santyl to yellow sloughed wound bed,  cover with hydrocolloid dressing including stage II superficial skin missing areas every 2 days Wound care center referral.  Continue pressure reduction, air mattress, gel cushion, frequent re positioning.    COPD (chronic obstructive pulmonary disease) Stable, continue O2  Atrial fibrillation with rapid ventricular response (HCC) Heart rate is in control, continue Metoprolol, Diltiazem  Edema Mild, continue Torsemide.   Insomnia secondary to depression with anxiety Sleeps better, continue Mirtazapine.     Family/ staff Communication: plan of care reviewed with the patient and charge nurse.   Labs/tests ordered: none  Time spend 25 minutes.

## 2020-02-24 NOTE — Assessment & Plan Note (Signed)
Sleeps better, continue Mirtazapine.  

## 2020-02-24 NOTE — Assessment & Plan Note (Signed)
Mild, continue Torsemide.

## 2020-02-25 ENCOUNTER — Other Ambulatory Visit: Payer: Self-pay

## 2020-02-25 ENCOUNTER — Telehealth: Payer: Self-pay | Admitting: Radiology

## 2020-02-25 ENCOUNTER — Ambulatory Visit (INDEPENDENT_AMBULATORY_CARE_PROVIDER_SITE_OTHER): Payer: Medicare Other | Admitting: Cardiology

## 2020-02-25 VITALS — BP 117/75 | HR 101 | Temp 94.6°F | Ht 60.0 in | Wt 99.0 lb

## 2020-02-25 DIAGNOSIS — I4891 Unspecified atrial fibrillation: Secondary | ICD-10-CM | POA: Diagnosis not present

## 2020-02-25 DIAGNOSIS — R600 Localized edema: Secondary | ICD-10-CM

## 2020-02-25 DIAGNOSIS — I340 Nonrheumatic mitral (valve) insufficiency: Secondary | ICD-10-CM | POA: Diagnosis not present

## 2020-02-25 DIAGNOSIS — Z79899 Other long term (current) drug therapy: Secondary | ICD-10-CM

## 2020-02-25 DIAGNOSIS — I10 Essential (primary) hypertension: Secondary | ICD-10-CM

## 2020-02-25 NOTE — Telephone Encounter (Signed)
Returned call to [employer] and verified address.

## 2020-02-25 NOTE — Telephone Encounter (Signed)
Enrolled patient for a 3 day Zio monitor to be mailed to patients home.  

## 2020-02-25 NOTE — Patient Instructions (Signed)
Medication Instructions:  STOP Eliquis  *If you need a refill on your cardiac medications before your next appointment, please call your pharmacy*   Lab Work: CMET, Mag, BNP (orders provided)  If you have labs (blood work) drawn today and your tests are completely normal, you will receive your results only by: [name] MyChart Message (if you have MyChart) OR . A paper copy in the mail If you have any lab test that is abnormal or we need to change your treatment, we will call you to review the results.   Testing/Procedures:  [facility]- Long Term Monitor Instructions   Your physician has requested you wear your ZIO patch monitor 3 days.   This is a single patch monitor.  [employer] supplies one patch monitor per enrollment.  Additional stickers are not available.   Please do not apply patch if you will be having a Nuclear Stress Test, Echocardiogram, Cardiac CT, MRI, or Chest Xray during the time frame you would be wearing the monitor. The patch cannot be worn during these tests.  You cannot remove and re-apply the ZIO XT patch monitor.   Your ZIO patch monitor will be sent USPS Priority mail from [facility] directly to your home address. The monitor may also be mailed to a PO BOX if home delivery is not available.   It may take 3-5 days to receive your monitor after you have been enrolled.   Once you have received you monitor, please review enclosed instructions.  Your monitor has already been registered assigning a specific monitor serial # to you.   Applying the monitor   Shave hair from upper left chest.   Hold abrader disc by orange tab.  Rub abrader in 40 strokes over left upper chest as indicated in your monitor instructions.   Clean area with 4 enclosed alcohol pads .  Use all pads to assure are is cleaned thoroughly.  Let dry.   Apply patch as indicated in monitor instructions.  Patch will be place under collarbone on left side of chest with arrow pointing upward.   Rub  patch adhesive wings for 2 minutes.Remove white label marked "1".  Remove white label marked "2".  Rub patch adhesive wings for 2 additional minutes.   While looking in a mirror, press and release button in center of patch.  A small green light will flash 3-4 times .  This will be your only indicator the monitor has been turned on.     Do not shower for the first 24 hours.  You may shower after the first 24 hours.   Press button if you feel a symptom. You will hear a small click.  Record Date, Time and Symptom in the Patient Log Book.   When you are ready to remove patch, follow instructions on last 2 pages of Patient Log Book.  Stick patch monitor onto last page of Patient Log Book.   Place Patient Log Book in [place] box.  Use locking tab on box and tape box closed securely.  The [employer] has [employer] on it.  Please place in mailbox as soon as possible.  Your physician should have your test results approximately 7 days after the monitor has been mailed back to [facility].   Call [facility] at [phone] if you have questions regarding your ZIO XT patch monitor.  Call them immediately if you see an orange light blinking on your monitor.   If your monitor falls off in  less than 4 days contact our Monitor department at [phone].  If your monitor becomes loose or falls off after 4 days call [employer] at [phone] for suggestions on securing your monitor.     Follow-Up: At [facility], you and your health needs are our priority.  As part of our continuing mission to provide you with exceptional heart care, we have created designated Provider Care Teams.  These Care Teams include your primary Cardiologist (physician) and Advanced Practice Providers (APPs -  Physician Assistants and Nurse Practitioners) who all work together to provide you with the care you need, when you need it.  We recommend signing up for the patient portal called "MyChart".   Sign up information is provided on this After Visit Summary.  MyChart is used to connect with patients for Virtual Visits (Telemedicine).  Patients are able to view lab/test results, encounter notes, upcoming appointments, etc.  Non-urgent messages can be sent to your provider as well.   To learn more about what you can do with MyChart, go to [email or web address].    Your next appointment:   3 month(s)  The format for your next appointment:   In Person  Provider:   Oswaldo Milian, MD

## 2020-02-25 NOTE — Telephone Encounter (Signed)
Orla from  [facility] stating the patient's monitor needs to be sent to them at [place], [place] [address]. She would like a call back and states to ask for health care. She states she will be there untill 3pm.

## 2020-02-26 ENCOUNTER — Telehealth: Payer: Self-pay | Admitting: *Deleted

## 2020-02-26 NOTE — Telephone Encounter (Signed)
Patient daughter, Maryagnes Amos, called and stated that Dr. Lyndel Safe is going to be seeing patient today and daughter is requesting Dr. Lyndel Safe to call her regarding patient.  Wants to speak with Dr. Lyndel Safe.  Please call [phone]

## 2020-02-27 ENCOUNTER — Other Ambulatory Visit (INDEPENDENT_AMBULATORY_CARE_PROVIDER_SITE_OTHER): Payer: Medicare Other

## 2020-02-27 ENCOUNTER — Encounter: Payer: Self-pay | Admitting: Nurse Practitioner

## 2020-02-27 ENCOUNTER — Encounter (HOSPITAL_BASED_OUTPATIENT_CLINIC_OR_DEPARTMENT_OTHER): Payer: Medicare Other | Admitting: Internal Medicine

## 2020-02-27 DIAGNOSIS — I4891 Unspecified atrial fibrillation: Secondary | ICD-10-CM

## 2020-02-27 NOTE — Telephone Encounter (Signed)
Was unable to see the Patient that day. D/W the Daughter. Her Wound is worrisome but at this time it is stable. No Signs of infection.  We are doing Santyl for the necrotic base. Will reval next Wed. Unfortunately Wound Clinic cannot see her for another 4 weeks Also d/w her about her nutritional status not been good. Will Follow

## 2020-03-04 ENCOUNTER — Non-Acute Institutional Stay (SKILLED_NURSING_FACILITY): Payer: Medicare Other | Admitting: Internal Medicine

## 2020-03-04 ENCOUNTER — Encounter: Payer: Self-pay | Admitting: Internal Medicine

## 2020-03-04 DIAGNOSIS — I4891 Unspecified atrial fibrillation: Secondary | ICD-10-CM | POA: Diagnosis not present

## 2020-03-04 DIAGNOSIS — E876 Hypokalemia: Secondary | ICD-10-CM

## 2020-03-04 DIAGNOSIS — L89153 Pressure ulcer of sacral region, stage 3: Secondary | ICD-10-CM | POA: Diagnosis not present

## 2020-03-04 DIAGNOSIS — R609 Edema, unspecified: Secondary | ICD-10-CM | POA: Diagnosis not present

## 2020-03-04 DIAGNOSIS — E785 Hyperlipidemia, unspecified: Secondary | ICD-10-CM

## 2020-03-04 LAB — BASIC METABOLIC PANEL
BUN: 26 — AB (ref 4–21)
CO2: 34 — AB (ref 13–22)
Chloride: 95 — AB (ref 99–108)
Creatinine: 0.7 (ref 0.5–1.1)
Glucose: 86
Potassium: 4 (ref 3.4–5.3)
Sodium: 137 (ref 137–147)

## 2020-03-04 LAB — COMPREHENSIVE METABOLIC PANEL: Calcium: 8.3 — AB (ref 8.7–10.7)

## 2020-03-04 NOTE — Progress Notes (Signed)
Location: [place] Room Number: 9-A Place of Service:  SNF (31)  Provider:   Code Status:  Goals of Care:  Advanced Directives 03/04/2020  Does Patient Have a Medical Advance Directive? Yes  Type of Paramedic of [place];Living will  Does patient want to make changes to medical advance directive? No - Patient declined  Copy of [place] in Chart? Yes - validated most recent copy scanned in chart (See row information)  Would patient like information on creating a medical advance directive? -  Pre-existing out of facility DNR order (yellow form or pink MOST form) -     Chief Complaint  Patient presents with  . Acute Visit    Patient is seen for a wound    HPI: Patient is a 86 y.o. female seen today for an acute visit for Wound and LE edema  Patient was admitted in the hospital from 5/30-6/7 for diarrhea diagnosed as colitis and A. fib with rapid ventricular response Patient has h/o Hypertension, Hyperlipidemia, Osteoarthritis, Unstable Gait Wheelchair Dependent,h/o Vaginal Atropy, Macular Degeneration  Sacral Wound Continues to be stable but does have depth of 2 cm . Still has slogh LE edema Edema is much improved Has had few skin tears which drained sometimes but mostly dry skin Diarrhea mostly resolved Atrial fibrillation Patient had a Zio patch placed by cardiology to see the A. fib burden.  She was taken off the Eliquis for now  Patient is eating better has gained some weight but states very frail.    Past Medical History:  Diagnosis Date  . Abnormal uterine bleeding   . Actinic keratosis 02/13/2012  . Acute bronchospasm 10/31/2011  . Cancer ([place]) 04/2008   Skin cancer of low back [name], MD  . Cataract   . Disorder of bone and cartilage, unspecified 02/18/2007  . Dizziness and giddiness 08/30/2010  . Dysmenorrhea   . Edema 12/19/2011  . Endometriosis   . 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 Osteoarthrosis, unspecified whether generalized or localized, unspecified site 08/20/2006  . Osteoporosis   . Other abnormal blood chemistry 03/14/2011  . Other and unspecified hyperlipidemia 10/18/2010  . Palpitations 02/13/2012  . Postmenopausal bleeding 08/30/2010  . Shortness of breath 11/28/2011  . Supraventricular premature beats 05/23/2011  . Unspecified essential hypertension 08/20/2006    Past Surgical History:  Procedure Laterality Date  . CARPAL TUNNEL RELEASE  2004   S. Norris MD  . CATARACT EXTRACTION W/ INTRAOCULAR LENS IMPLANT Right 2010  . CATARACT EXTRACTION W/ INTRAOCULAR LENS IMPLANT Left 10/2012  . DILATION AND CURETTAGE OF UTERUS    . EYE SURGERY Right 07/2009   cataract extraction/IOLI [name], MD  . KNEE ARTHROSCOPY Right 2008   Torn meniscus, Dr. Veverly Fells    Allergies  Allergen Reactions  . Lomotil [Diphenoxylate] Nausea And Vomiting  . Sulfa Antibiotics Other (See Comments)    Unknown   . Amlodipine Swelling    Outpatient Encounter Medications as of 03/04/2020  Medication Sig  . Amino Acids-Protein Hydrolys (FEEDING SUPPLEMENT, PRO-STAT SUGAR FREE 64,) LIQD Take 30 mLs by mouth 3 (three) times daily with meals.  [name] diltiazem (CARDIZEM CD) 120 MG 24 hr capsule Take 1 capsule (120 mg total) by mouth daily.  [name] loperamide (IMODIUM) 2 MG capsule Take 1 capsule (2 mg total) by mouth every 6 (six) hours as needed for diarrhea or loose stools.  . magnesium oxide (MAG-OX) 400 MG tablet Take 400 mg by mouth daily.  [name]  metoprolol tartrate (LOPRESSOR) 50 MG tablet Take 75 mg by mouth 2 (two) times daily.  . mirtazapine (REMERON) 7.5 MG tablet Take 7.5 mg by mouth every other day.   . multivitamin-lutein (OCUVITE-LUTEIN) CAPS capsule Take 1 capsule by mouth in the morning and at bedtime.   . potassium chloride SA (KLOR-CON) 20 MEQ tablet Take 30 mEq by mouth every other day.   . saccharomyces boulardii (FLORASTOR) 250 MG capsule Take 250 mg by mouth 2  (two) times daily.  [name] torsemide (DEMADEX) 20 MG tablet Take 20 mg by mouth every other day.   . zinc oxide 20 % ointment Apply 1 application topically as needed for irritation.   No facility-administered encounter medications on file as of 03/04/2020.    Review of Systems:  Review of Systems  Constitutional: Negative.   HENT: Negative.   Respiratory: Negative.   Cardiovascular: Positive for leg swelling.  Gastrointestinal: Negative.   Genitourinary: Negative.   Musculoskeletal: Positive for gait problem.  Skin: Positive for wound.  Neurological: Positive for weakness.  Psychiatric/Behavioral: Negative.     Health Maintenance  Topic Date Due  . COVID-19 Vaccine (1) Never done  . DEXA SCAN  Never done  . PNA vac Low Risk Adult (2 of 2 - PPSV23) 08/21/2000  . TETANUS/TDAP  08/21/2009  . INFLUENZA VACCINE  03/21/2020    Physical Exam: Vitals:   03/04/20 1621  BP: 108/79  Pulse: 84  Resp: 20  Temp: (!) 97.5 F (36.4 C)  TempSrc: Oral  SpO2: 98%  Weight: 104 lb 12.8 oz (47.5 kg)  Height: 5\' 2"  (1.575 m)   Body mass index is 19.17 kg/m. Physical Exam Vitals reviewed.  Constitutional:      Appearance: Normal appearance.  HENT:     Head: Normocephalic.     Nose: Nose normal.     Mouth/Throat:     Mouth: Mucous membranes are moist.     Pharynx: Oropharynx is clear.  Eyes:     Pupils: Pupils are equal, round, and reactive to light.  Cardiovascular:     Rate and Rhythm: Normal rate and regular rhythm.     Pulses: Normal pulses.  Pulmonary:     Effort: Pulmonary effort is normal. No respiratory distress.     Breath sounds: Normal breath sounds. No wheezing or rales.  Abdominal:     General: Abdomen is flat. Bowel sounds are normal.     Palpations: Abdomen is soft.  Musculoskeletal:        General: Swelling present.     Cervical back: Neck supple.  Skin:    General: Skin is warm.     Comments: Has 2 skin tears in both lower extremities but mostly they are dry.   Her sacral wound is 2 cm deep and almost 4 cm wide.  Does have slough on the base.  But otherwise has clean margins   Neurological:     General: No focal deficit present.     Mental Status: She is alert.  Psychiatric:        Mood and Affect: Mood normal.     Labs reviewed: Basic Metabolic Panel: Recent Labs    07/14/19 0820 01/15/20 0840 01/19/20 0820 01/20/20 0311 01/24/20 0414 01/24/20 0414 01/25/20 0428 01/25/20 0428 01/26/20 0446 01/29/20 0000 02/12/20 0000  NA 136   < >  --    < > 133*   < > 133*   < > 130* 132*  132* 141  K 3.7   < >  --    < >  3.7   < > 3.6   < > 3.6 4.5  4.5 3.7  CL 94*   < >  --    < > 103   < > 103   < > 101 99  99 93*  CO2 30   < >  --    < > 22   < > 21*   < > 22 19  19  41*  GLUCOSE 98   < >  --    < > 123*  --  118*  --  109*  --   --   BUN 17   < >  --    < > 17   < > 15   < > 15 12  12  23*  CREATININE 0.58*   < >  --    < > 0.70   < > 0.61   < > 0.66 0.6  0.6 0.7  CALCIUM 9.4   < >  --    < > 8.2*   < > 8.0*   < > 7.7* 8.4*  8.4* 8.2*  MG  --    < >  --    < > 1.6*  --  2.1  --  1.8  --   --   TSH 1.78  --  2.195  --   --   --   --   --   --   --   --    < > = values in this interval not displayed.   Liver Function Tests: Recent Labs    07/14/19 0820 07/14/19 0820 01/15/20 0840 01/18/20 1302 02/12/20 0000  AST 15   < > 12 14* 20  ALT 8   < > 7 10 20   ALKPHOS  --   --   --  47 47  BILITOT 0.5  --  0.7 1.0  --   PROT 6.1  --  5.9* 5.8*  --   ALBUMIN  --   --   --  3.0* 2.9*   < > = values in this interval not displayed.   No results for input(s): LIPASE, AMYLASE in the last 8760 hours. No results for input(s): AMMONIA in the last 8760 hours. CBC: Recent Labs    01/24/20 0414 01/24/20 0414 01/25/20 0428 01/25/20 0428 01/26/20 0446 01/29/20 0000 02/12/20 0000  WBC 11.8*   < > 12.6*   < > 10.5 14.0  14.0 6.7  NEUTROABS 9.6*   < > 10.6*  --  8.6*  --  5,105  HGB 11.1*   < > 9.9*   < > 10.0* 12.6  12.6 10.1*  HCT  35.7*   < > 31.4*   < > 31.6* 0*  38 31*  MCV 88.1  --  87.7  --  86.8  --   --   PLT 332   < > 279   < > 255 328  328 188   < > = values in this interval not displayed.   Lipid Panel: Recent Labs    07/14/19 0820  CHOL 191  HDL 65  LDLCALC 110*  TRIG 71  CHOLHDL 2.9   No results found for: HGBA1C  Procedures since last visit: No results found.  Assessment/Plan Pressure injury of sacral region, stage 3 (HCC) Stable. Depth is 2 cm Worried about Osteo risk as Close to the Sacral Bone  No Change Still has Necrotic area Santyl with NS Packing Has appointment with wound care  Skin Tears Continue Wrapping PRN  Edema, unspecified type Doing well with QOD demadex BMP today showed BUN of 26 creat of 0 .68 Hypokalemia Potassium was 4 Repeat BMP and Magnesium Atrial fibrillation with rapid ventricular response (HCC) Taken off Eliquis per Cardiology  Also on Metoprolol and Cadizem Awaiting ZIO patch results  Weight Gain Continue on Remeron Appetite much better     Labs/tests ordered:  * No order type specified * Next appt:  Visit date not found

## 2020-03-08 ENCOUNTER — Encounter: Payer: Self-pay | Admitting: Nurse Practitioner

## 2020-03-08 ENCOUNTER — Non-Acute Institutional Stay (SKILLED_NURSING_FACILITY): Payer: Medicare Other | Admitting: Nurse Practitioner

## 2020-03-08 DIAGNOSIS — F5105 Insomnia due to other mental disorder: Secondary | ICD-10-CM

## 2020-03-08 DIAGNOSIS — F418 Other specified anxiety disorders: Secondary | ICD-10-CM

## 2020-03-08 DIAGNOSIS — I4891 Unspecified atrial fibrillation: Secondary | ICD-10-CM | POA: Diagnosis not present

## 2020-03-08 DIAGNOSIS — R609 Edema, unspecified: Secondary | ICD-10-CM | POA: Diagnosis not present

## 2020-03-08 DIAGNOSIS — D649 Anemia, unspecified: Secondary | ICD-10-CM | POA: Diagnosis not present

## 2020-03-08 DIAGNOSIS — L89153 Pressure ulcer of sacral region, stage 3: Secondary | ICD-10-CM

## 2020-03-08 LAB — HEPATIC FUNCTION PANEL
ALT: 7 (ref 7–35)
AST: 12 — AB (ref 13–35)
Alkaline Phosphatase: 47 (ref 25–125)
Bilirubin, Total: 0.4

## 2020-03-08 LAB — COMPREHENSIVE METABOLIC PANEL
Albumin: 2.7 — AB (ref 3.5–5.0)
Calcium: 8.5 — AB (ref 8.7–10.7)
Globulin: 2.3

## 2020-03-08 LAB — BASIC METABOLIC PANEL
BUN: 24 — AB (ref 4–21)
CO2: 37 — AB (ref 13–22)
Chloride: 97 — AB (ref 99–108)
Creatinine: 0.6 (ref 0.5–1.1)
Glucose: 87
Potassium: 4 (ref 3.4–5.3)
Sodium: 137 (ref 137–147)

## 2020-03-08 LAB — IRON,TIBC AND FERRITIN PANEL
Ferritin: 102
Iron: 25

## 2020-03-08 LAB — CBC AND DIFFERENTIAL
HCT: 26 — AB (ref 36–46)
Hemoglobin: 8.3 — AB (ref 12.0–16.0)
Neutrophils Absolute: 7280
Platelets: 271 (ref 150–399)
WBC: 9.1

## 2020-03-08 LAB — VITAMIN B12: Vitamin B-12: 311

## 2020-03-08 LAB — CBC: RBC: 2.93 — AB (ref 3.87–5.11)

## 2020-03-08 NOTE — Assessment & Plan Note (Signed)
Mood, weight are stabilizing, continue Mirtazapine.

## 2020-03-08 NOTE — Assessment & Plan Note (Signed)
Sacral wound, stage 3, no s/s of bleeding or infection, continue saline gauze packing, pressure reduction, f/u wound care center.

## 2020-03-08 NOTE — Assessment & Plan Note (Signed)
03/08/20 wbc 9.1, Hgb 8.3, plt 271, neutrophils 80%, Na 137, K 4.0, Bun 24, creat 0.61, eGFR 78 Clinically presumed blood loss anemia, adding Fe 4x/wk, repeat CBC/diff in am, obtain FOBT x3, Iron, Vit B12, Faolte, Retic count, Fe sat, Ferritin.

## 2020-03-08 NOTE — Assessment & Plan Note (Signed)
Heart rate is in control, continue Diltiazem, Metoprolol 

## 2020-03-08 NOTE — Progress Notes (Signed)
Location:   SNF [facility] Room Number: 9 Place of Service:  SNF (31) Provider: Lennie Odor Kristoff Coonradt NP  Sherlonda Flater X, NP  Patient Care Team: Berda Shelvin X, NP as PCP - General (Internal Medicine) Melina Modena, [facility] Netta Cedars, MD as Consulting Physician (Orthopedic Surgery) Luberta Mutter, MD as Consulting Physician (Ophthalmology) Sydnee Levans, MD as Consulting Physician (Dermatology) Adrian Prows, MD as Consulting Physician (Cardiology)  Extended Emergency Contact Information Primary Emergency Contact: [place] Phone: [phone] Mobile Phone: [phone] Relation: Daughter  Code Status:  DNR Goals of care: Advanced Directive information Advanced Directives 03/04/2020  Does Patient Have a Medical Advance Directive? Yes  Type of Paramedic of [place];Living will  Does patient want to make changes to medical advance directive? No - Patient declined  Copy of [place] in Chart? Yes - validated most recent copy scanned in chart (See row information)  Would patient like information on creating a medical advance directive? -  Pre-existing out of facility DNR order (yellow form or pink MOST form) -     Chief Complaint  Patient presents with   Acute Visit    Anemia    HPI:  Pt is a 86 y.o. female seen today for an acute visit for Hgb 8.3 03/08/20 dropped from 02/12/20 Hgb 10.66mdenied abd pain, blood in stool or urine, nausea, vomiting. She stated she is in her usual stated of health.   Sacral pressure wound, stage 3, no s/s of bleeding or infection, saline pack, pressure reduction. Pending wound care center tx.   Afib, off Eliquis, takes Diltiazem, Metoprolol.   Edema BLE, persisted moderate, takes Torsemide qod.   Mood/weight, stable, takes Mirtazapine .     Past Medical History:  Diagnosis Date   Abnormal uterine bleeding    Actinic keratosis 02/13/2012   Acute bronchospasm  10/31/2011   Cancer ([place] 04/2008   Skin cancer of low back GSarajane Jews MD   Cataract    Disorder of bone and cartilage, unspecified 02/18/2007   Dizziness and giddiness 08/30/2010   Dysmenorrhea    Edema 12/19/2011   Endometriosis    Intrinsic asthma, unspecified 12/01/1936   Osteoarthrosis, unspecified whether generalized or localized, unspecified site 08/20/2006   Osteoporosis    Other abnormal blood chemistry 03/14/2011   Other and unspecified hyperlipidemia 10/18/2010   Palpitations 02/13/2012   Postmenopausal bleeding 08/30/2010   Shortness of breath 11/28/2011   Supraventricular premature beats 05/23/2011   Unspecified essential hypertension 08/20/2006   Past Surgical History:  Procedure Laterality Date   CARPAL TUNNEL RELEASE  2004   S. Norris MD   CATARACT EXTRACTION W/ INTRAOCULAR LENS IMPLANT Right 2010   CATARACT EXTRACTION W/ INTRAOCULAR LENS IMPLANT Left 10/2012   DILATION AND CURETTAGE OF UTERUS     EYE SURGERY Right 07/2009   cataract extraction/IOLI MEllie Lunch MD   KNEE ARTHROSCOPY Right 2008   Torn meniscus, Dr. NVeverly Fells   Allergies  Allergen Reactions   Lomotil [Diphenoxylate] Nausea And Vomiting   Sulfa Antibiotics Other (See Comments)    Unknown    Amlodipine Swelling    Allergies as of 03/08/2020      Reactions   Lomotil [diphenoxylate] Nausea And Vomiting   Sulfa Antibiotics Other (See Comments)   Unknown    Amlodipine Swelling      Medication List       Accurate as of March 08, 2020 11:59 PM. If you have any questions, ask your nurse  or doctor.        diltiazem 120 MG 24 hr capsule Commonly known as: CARDIZEM CD Take 1 capsule (120 mg total) by mouth daily.   feeding supplement (PRO-STAT SUGAR FREE 64) Liqd Take 30 mLs by mouth 3 (three) times daily with meals.   loperamide 2 MG capsule Commonly known as: IMODIUM Take 1 capsule (2 mg total) by mouth every 6 (six) hours as needed for diarrhea or loose stools.     magnesium oxide 400 MG tablet Commonly known as: MAG-OX Take 400 mg by mouth daily.   metoprolol tartrate 50 MG tablet Commonly known as: LOPRESSOR Take 75 mg by mouth 2 (two) times daily.   mirtazapine 7.5 MG tablet Commonly known as: REMERON Take 7.5 mg by mouth every other day.   multivitamin-lutein Caps capsule Take 1 capsule by mouth in the morning and at bedtime.   potassium chloride SA 20 MEQ tablet Commonly known as: KLOR-CON Take 30 mEq by mouth every other day.   saccharomyces boulardii 250 MG capsule Commonly known as: FLORASTOR Take 250 mg by mouth 2 (two) times daily.   torsemide 20 MG tablet Commonly known as: DEMADEX Take 20 mg by mouth every other day.   zinc oxide 20 % ointment Apply 1 application topically as needed for irritation.       Review of Systems  Constitutional: Negative for appetite change, fatigue and fever.  HENT: Positive for hearing loss. Negative for congestion, trouble swallowing and voice change.   Eyes: Negative for visual disturbance.  Respiratory: Positive for cough and shortness of breath. Negative for chest tightness and wheezing.        Chronic, uses O2  Cardiovascular: Positive for leg swelling. Negative for chest pain and palpitations.  Gastrointestinal: Negative for abdominal pain, constipation, diarrhea and vomiting.  Genitourinary: Negative for difficulty urinating, dysuria and urgency.  Musculoskeletal: Positive for gait problem.  Skin: Positive for wound. Negative for color change.  Neurological: Negative for speech difficulty, weakness and light-headedness.  Psychiatric/Behavioral: Positive for sleep disturbance. Negative for confusion. The patient is not nervous/anxious.     Immunization History  Administered Date(s) Administered   Influenza Whole 08/21/2010, 05/21/2012   Influenza, High Dose Seasonal PF 05/30/2017, 06/04/2019   Influenza,inj,Quad PF,6+ Mos 05/23/2018   Influenza-Unspecified 06/04/2014,  05/20/2015, 06/08/2016   Pneumococcal Conjugate-13 08/22/1999   Td 08/22/1999   Zoster 08/21/2005   Pertinent  Health Maintenance Due  Topic Date Due   DEXA SCAN  Never done   PNA vac Low Risk Adult (2 of 2 - PPSV23) 08/21/2000   INFLUENZA VACCINE  03/21/2020   Fall Risk  07/23/2019 01/15/2019 07/17/2018 06/04/2018 09/12/2017  Falls in the past year? 0 0 0 No No  Number falls in past yr: 0 0 0 - -  Injury with Fall? 0 0 0 - -   Functional Status Survey:    Vitals:   03/08/20 1518  BP: 103/72  Pulse: 84  Resp: 20  Temp: (!) 97.5 F (36.4 C)  SpO2: 90%  Weight: 103 lb 6.4 oz (46.9 kg)  Height: '5\' 2"'  (1.575 m)   Body mass index is 18.91 kg/m. Physical Exam Vitals and nursing note reviewed.  Constitutional:      Appearance: Normal appearance.  HENT:     Head: Normocephalic and atraumatic.     Mouth/Throat:     Mouth: Mucous membranes are moist.  Eyes:     Extraocular Movements: Extraocular movements intact.     Conjunctiva/sclera: Conjunctivae normal.  Pupils: Pupils are equal, round, and reactive to light.     Comments: Crusted eyelashes, left eye low vision  Cardiovascular:     Rate and Rhythm: Normal rate. Rhythm irregular.     Heart sounds: No murmur heard.      Comments: Weak DP pulses.  Pulmonary:     Breath sounds: No wheezing, rhonchi or rales.  Abdominal:     General: Bowel sounds are normal.     Palpations: Abdomen is soft.     Tenderness: There is no abdominal tenderness. There is no guarding or rebound.  Musculoskeletal:     Cervical back: Normal range of motion and neck supple.     Right lower leg: Edema present.     Left lower leg: Edema present.     Comments: 1-2 edema BLE  Skin:    General: Skin is warm and dry.     Comments: sacral pressure wound(stage III), crater noted, peri wound superficial skin missing in the reddened skin(state II), air mattress, gel cushion are in place, no purulent drainage. Yellow slough at wound bed noted.    Lateral right lower leg skin tear is scabbed over   Neurological:     General: No focal deficit present.     Mental Status: She is alert and oriented to person, place, and time. Mental status is at baseline.     Gait: Gait abnormal.  Psychiatric:        Mood and Affect: Mood normal.        Behavior: Behavior normal.        Thought Content: Thought content normal.        Judgment: Judgment normal.     Labs reviewed: Recent Labs    01/24/20 0414 01/24/20 0414 01/25/20 0428 01/25/20 0428 01/26/20 0446 01/26/20 0446 01/29/20 0000 02/12/20 0000 03/04/20 0000  NA 133*   < > 133*   < > 130*  --  132*   132* 141 137  K 3.7   < > 3.6   < > 3.6   < > 4.5   4.5 3.7 4.0  CL 103   < > 103   < > 101   < > 99   99 93* 95*  CO2 22   < > 21*   < > 22   < > 19   19 41* 34*  GLUCOSE 123*  --  118*  --  109*  --   --   --   --   BUN 17   < > 15   < > 15  --  12   12 23* 26*  CREATININE 0.70   < > 0.61   < > 0.66  --  0.6   0.6 0.7 0.7  CALCIUM 8.2*   < > 8.0*   < > 7.7*   < > 8.4*   8.4* 8.2* 8.3*  MG 1.6*  --  2.1  --  1.8  --   --   --   --    < > = values in this interval not displayed.   Recent Labs    07/14/19 0820 07/14/19 0820 01/15/20 0840 01/18/20 1302 02/12/20 0000  AST 15   < > 12 14* 20  ALT 8   < > '7 10 20  ' ALKPHOS  --   --   --  47 47  BILITOT 0.5  --  0.7 1.0  --   PROT 6.1  --  5.9* 5.8*  --  ALBUMIN  --   --   --  3.0* 2.9*   < > = values in this interval not displayed.   Recent Labs    01/24/20 0414 01/24/20 0414 01/25/20 0428 01/25/20 0428 01/26/20 0446 01/29/20 0000 02/12/20 0000  WBC 11.8*   < > 12.6*   < > 10.5 14.0   14.0 6.7  NEUTROABS 9.6*   < > 10.6*  --  8.6*  --  5,105  HGB 11.1*   < > 9.9*   < > 10.0* 12.6   12.6 10.1*  HCT 35.7*   < > 31.4*   < > 31.6* 0*   38 31*  MCV 88.1  --  87.7  --  86.8  --   --   PLT 332   < > 279   < > 255 328   328 188   < > = values in this interval not displayed.   Lab Results  Component Value Date   TSH  2.195 01/19/2020   No results found for: HGBA1C Lab Results  Component Value Date   CHOL 191 07/14/2019   HDL 65 07/14/2019   LDLCALC 110 (H) 07/14/2019   TRIG 71 07/14/2019   CHOLHDL 2.9 07/14/2019    Significant Diagnostic Results in last 30 days:  No results found.  Assessment/Plan Anemia 03/08/20 wbc 9.1, Hgb 8.3, plt 271, neutrophils 80%, Na 137, K 4.0, Bun 24, creat 0.61, eGFR 78 Clinically presumed blood loss anemia, adding Fe 4x/wk, repeat CBC/diff in am, obtain FOBT x3, Iron, Vit B12, Faolte, Retic count, Fe sat, Ferritin.   Edema Persisted, moderate, continue Torsemide.   Insomnia secondary to depression with anxiety Mood, weight are stabilizing, continue Mirtazapine.   Atrial fibrillation with rapid ventricular response (HCC) Heart rate is in control, continue Diltiazem, Metoprolol.   Pressure ulcer Sacral wound, stage 3, no s/s of bleeding or infection, continue saline gauze packing, pressure reduction, f/u wound care center.     Family/ staff Communication: plan of care reviewed with the patient and charge nurse.   Labs/tests ordered:  CBC/diff, FOBT x3, Iron, Fe Sat, Vit B12, Folate, Retic count, Ferritin  Time spend 35 minutes.

## 2020-03-08 NOTE — Assessment & Plan Note (Signed)
Persisted, moderate, continue Torsemide.  

## 2020-03-09 ENCOUNTER — Encounter: Payer: Self-pay | Admitting: Nurse Practitioner

## 2020-03-16 ENCOUNTER — Non-Acute Institutional Stay (SKILLED_NURSING_FACILITY): Payer: Medicare Other | Admitting: Nurse Practitioner

## 2020-03-16 DIAGNOSIS — I872 Venous insufficiency (chronic) (peripheral): Secondary | ICD-10-CM | POA: Insufficient documentation

## 2020-03-16 DIAGNOSIS — R609 Edema, unspecified: Secondary | ICD-10-CM

## 2020-03-16 DIAGNOSIS — I83029 Varicose veins of left lower extremity with ulcer of unspecified site: Secondary | ICD-10-CM

## 2020-03-16 DIAGNOSIS — I83892 Varicose veins of left lower extremities with other complications: Secondary | ICD-10-CM

## 2020-03-16 DIAGNOSIS — D649 Anemia, unspecified: Secondary | ICD-10-CM

## 2020-03-16 DIAGNOSIS — L97929 Non-pressure chronic ulcer of unspecified part of left lower leg with unspecified severity: Secondary | ICD-10-CM

## 2020-03-16 DIAGNOSIS — I10 Essential (primary) hypertension: Secondary | ICD-10-CM

## 2020-03-16 DIAGNOSIS — L89153 Pressure ulcer of sacral region, stage 3: Secondary | ICD-10-CM | POA: Diagnosis not present

## 2020-03-16 DIAGNOSIS — I4891 Unspecified atrial fibrillation: Secondary | ICD-10-CM

## 2020-03-16 DIAGNOSIS — F418 Other specified anxiety disorders: Secondary | ICD-10-CM

## 2020-03-16 DIAGNOSIS — F5105 Insomnia due to other mental disorder: Secondary | ICD-10-CM

## 2020-03-16 NOTE — Assessment & Plan Note (Signed)
Stable, continue Mirtazapine.  

## 2020-03-16 NOTE — Assessment & Plan Note (Signed)
x2 silver dollar sized venous ulcers developed from previous skin tears, moderate amount of serosanguinous drainage seen, slightly warmth and redness in the areas. Will ally Alginate Ag dressing daily and prn, may consider ABT if s/s of infection develops.

## 2020-03-16 NOTE — Assessment & Plan Note (Signed)
Hgb 8.3, no active bleeding, continue Fe

## 2020-03-16 NOTE — Assessment & Plan Note (Addendum)
Blood pressure runs low, she denied headache, dizziness, fatigue, chest pain/pressure, or palpitation. observe

## 2020-03-16 NOTE — Progress Notes (Signed)
Location:    [facility] Room Number: 9 Place of Service:  SNF (31) Provider: Marlana Latus NP  Gurleen Larrivee X, NP  Patient Care Team: Alleyne Lac X, NP as PCP - General (Internal Medicine) [place] Netta Cedars, MD as Consulting Physician (Orthopedic Surgery) Luberta Mutter, MD as Consulting Physician (Ophthalmology) Sydnee Levans, MD as Consulting Physician (Dermatology) Adrian Prows, MD as Consulting Physician (Cardiology)  Extended Emergency Contact Information Primary Emergency Contact: [place] Phone: [phone] Mobile Phone: [phone] Relation: Daughter  Code Status:  DNR Goals of care: Advanced Directive information Advanced Directives 03/04/2020  Does Patient Have a Medical Advance Directive? Yes  Type of Paramedic of [place];Living will  Does patient want to make changes to medical advance directive? No - Patient declined  Copy of [place] in Chart? Yes - validated most recent copy scanned in chart (See row information)  Would patient like information on creating a medical advance directive? -  Pre-existing out of facility DNR order (yellow form or pink MOST form) -     Chief Complaint  Patient presents with  . Medical Management of Chronic Issues    HPI:  Pt is a 86 y.o. female seen today for medical management of chronic diseases.  Left lower leg open wounds developed from previous skin tears  Anemia, Hgb 8.3 03/08/20, takes Fe  Stage 3 sacral pressure ulcer, pending wound care center consultation  Afib, takes Metoprolol, Diltiazem, off Eliquis.    BLE edema, persists, takes Torsemide qod  Mood/weight, takes Mirtazapine.        Past Medical History:  Diagnosis Date  . Abnormal uterine bleeding   . Actinic keratosis 02/13/2012  . Acute bronchospasm 10/31/2011  . Cancer ([place]) 04/2008   Skin cancer of low back [name], MD  . Cataract     . Disorder of bone and cartilage, unspecified 02/18/2007  . Dizziness and giddiness 08/30/2010  . Dysmenorrhea   . Edema 12/19/2011  . Endometriosis   . 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 Osteoarthrosis, unspecified whether generalized or localized, unspecified site 08/20/2006  . Osteoporosis   . Other abnormal blood chemistry 03/14/2011  . Other and unspecified hyperlipidemia 10/18/2010  . Palpitations 02/13/2012  . Postmenopausal bleeding 08/30/2010  . Shortness of breath 11/28/2011  . Supraventricular premature beats 05/23/2011  . Unspecified essential hypertension 08/20/2006   Past Surgical History:  Procedure Laterality Date  . CARPAL TUNNEL RELEASE  2004   S. Norris MD  . CATARACT EXTRACTION W/ INTRAOCULAR LENS IMPLANT Right 2010  . CATARACT EXTRACTION W/ INTRAOCULAR LENS IMPLANT Left 10/2012  . DILATION AND CURETTAGE OF UTERUS    . EYE SURGERY Right 07/2009   cataract extraction/IOLI [name], MD  . KNEE ARTHROSCOPY Right 2008   Torn meniscus, Dr. Veverly Fells    Allergies  Allergen Reactions  . Lomotil [Diphenoxylate] Nausea And Vomiting  . Sulfa Antibiotics Other (See Comments)    Unknown   . Amlodipine Swelling    Allergies as of 03/16/2020      Reactions   Lomotil [diphenoxylate] Nausea And Vomiting   Sulfa Antibiotics Other (See Comments)   Unknown    Amlodipine Swelling      Medication List       Accurate as of March 16, 2020 11:59 PM. If you have any questions, ask your nurse or doctor.        diltiazem 120 MG 24 hr capsule Commonly known as: CARDIZEM  CD Take 1 capsule (120 mg total) by mouth daily.   feeding supplement (PRO-STAT SUGAR FREE 64) Liqd Take 30 mLs by mouth 3 (three) times daily with meals.   ferrous sulfate 325 (65 FE) MG tablet Take 325 mg by mouth daily with breakfast.   loperamide 2 MG capsule Commonly known as: IMODIUM Take 1 capsule (2 mg total) by mouth every 6 (six) hours as needed for diarrhea or loose stools.    magnesium oxide 400 MG tablet Commonly known as: MAG-OX Take 400 mg by mouth daily.   metoprolol tartrate 50 MG tablet Commonly known as: LOPRESSOR Take 75 mg by mouth 2 (two) times daily.   mirtazapine 7.5 MG tablet Commonly known as: REMERON Take 7.5 mg by mouth every other day.   multivitamin-lutein Caps capsule Take 1 capsule by mouth in the morning and at bedtime.   potassium chloride SA 20 MEQ tablet Commonly known as: KLOR-CON Take 30 mEq by mouth daily.   saccharomyces boulardii 250 MG capsule Commonly known as: FLORASTOR Take 250 mg by mouth 2 (two) times daily.   torsemide 20 MG tablet Commonly known as: DEMADEX Take 20 mg by mouth every other day.   zinc oxide 20 % ointment Apply 1 application topically as needed for irritation.       Review of Systems  Constitutional: Negative for appetite change, fatigue and fever.  HENT: Positive for hearing loss. Negative for congestion, trouble swallowing and voice change.   Eyes: Negative for visual disturbance.  Respiratory: Positive for cough and shortness of breath. Negative for chest tightness and wheezing.        Chronic, uses O2  Cardiovascular: Positive for leg swelling. Negative for chest pain and palpitations.  Gastrointestinal: Negative for abdominal pain, constipation, diarrhea and vomiting.  Genitourinary: Negative for difficulty urinating, dysuria and urgency.  Musculoskeletal: Positive for gait problem.  Skin: Positive for wound. Negative for color change.  Neurological: Negative for speech difficulty, weakness and light-headedness.  Psychiatric/Behavioral: Positive for sleep disturbance. Negative for confusion. The patient is not nervous/anxious.     Immunization History  Administered Date(s) Administered  . Influenza Whole 08/21/2010, 05/21/2012  . Influenza, High Dose Seasonal PF 05/30/2017, 06/04/2019  . Influenza,inj,Quad PF,6+ Mos 05/23/2018  . Influenza-Unspecified 06/04/2014, 05/20/2015,  06/08/2016  . Pneumococcal Conjugate-13 08/22/1999  . Td 08/22/1999  . Zoster 08/21/2005   Pertinent  Health Maintenance Due  Topic Date Due  . DEXA SCAN  Never done  . PNA vac Low Risk Adult (2 of 2 - PPSV23) 08/21/2000  . INFLUENZA VACCINE  03/21/2020   Fall Risk  07/23/2019 01/15/2019 07/17/2018 06/04/2018 09/12/2017  Falls in the past year? 0 0 0 No No  Number falls in past yr: 0 0 0 - -  Injury with Fall? 0 0 0 - -   Functional Status Survey:    Vitals:   03/18/20 1320  BP: (!) 92/63  Pulse: (!) 118  Resp: 20  Temp: 97.6 F (36.4 C)  SpO2: 97%  Weight: 99 lb 11.2 oz (45.2 kg)  Height: 5\' 2"  (1.575 m)   Body mass index is 18.24 kg/m. Physical Exam Vitals and nursing note reviewed.  Constitutional:      Appearance: Normal appearance.  HENT:     Head: Normocephalic and atraumatic.     Mouth/Throat:     Mouth: Mucous membranes are moist.  Eyes:     Extraocular Movements: Extraocular movements intact.     Conjunctiva/sclera: Conjunctivae normal.     Pupils:  Pupils are equal, round, and reactive to light.     Comments: Crusted eyelashes, left eye low vision  Cardiovascular:     Rate and Rhythm: Normal rate. Rhythm irregular.     Heart sounds: No murmur heard.      Comments: Weak DP pulses.  Pulmonary:     Breath sounds: No wheezing, rhonchi or rales.  Abdominal:     General: Bowel sounds are normal.     Palpations: Abdomen is soft.     Tenderness: There is no abdominal tenderness. There is no guarding or rebound.  Musculoskeletal:     Cervical back: Normal range of motion and neck supple.     Right lower leg: Edema present.     Left lower leg: Edema present.     Comments: 1-2 edema BLE  Skin:    General: Skin is warm and dry.     Findings: Erythema present.     Comments: sacral pressure wound(stage III), crater noted, peri wound superficial skin missing in the reddened skin(state II), air mattress, gel cushion are in place, no purulent drainage. Yellow  slough at wound bed noted.  x2 silver dollar sized venous ulcers developed from previous skin tears, moderate amount of serosanguinous drainage seen, slightly warmth and redness in the areas.    Neurological:     General: No focal deficit present.     Mental Status: She is alert and oriented to person, place, and time. Mental status is at baseline.     Gait: Gait abnormal.  Psychiatric:        Mood and Affect: Mood normal.        Behavior: Behavior normal.        Thought Content: Thought content normal.        Judgment: Judgment normal.     Labs reviewed: Recent Labs    01/24/20 0414 01/24/20 0414 01/25/20 0428 01/25/20 0428 01/26/20 0446 01/29/20 0000 02/12/20 0000 03/04/20 0000 03/08/20 0000  NA 133*   < > 133*   < > 130*   < > 141 137 137  K 3.7   < > 3.6   < > 3.6   < > 3.7 4.0 4.0  CL 103   < > 103   < > 101   < > 93* 95* 97*  CO2 22   < > 21*   < > 22   < > 41* 34* 37*  GLUCOSE 123*  --  118*  --  109*  --   --   --   --   BUN 17   < > 15   < > 15   < > 23* 26* 24*  CREATININE 0.70   < > 0.61   < > 0.66   < > 0.7 0.7 0.6  CALCIUM 8.2*   < > 8.0*   < > 7.7*   < > 8.2* 8.3* 8.5*  MG 1.6*   < > 2.1  --  1.8  --   --   --  1.6   < > = values in this interval not displayed.   Recent Labs    07/14/19 0820 07/14/19 0820 01/15/20 0840 01/15/20 0840 01/18/20 1302 02/12/20 0000 03/08/20 0000  AST 15   < > 12   < > 14* 20 12*  ALT 8   < > 7   < > 10 20 7   ALKPHOS  --   --   --   --  47 47 47  BILITOT 0.5  --  0.7  --  1.0  --   --   PROT 6.1  --  5.9*  --  5.8*  --   --   ALBUMIN  --   --   --   --  3.0* 2.9* 2.7*   < > = values in this interval not displayed.   Recent Labs    01/24/20 0414 01/24/20 0414 01/25/20 0428 01/25/20 0428 01/26/20 0446 01/26/20 0446 01/29/20 0000 02/12/20 0000 03/08/20 0000  WBC 11.8*   < > 12.6*   < > 10.5  --  14.0  14.0 6.7 9.1  NEUTROABS 9.6*   < > 10.6*   < > 8.6*  --   --  5,105 7,280  HGB 11.1*   < > 9.9*   < > 10.0*    < > 12.6  12.6 10.1* 8.3*  HCT 35.7*   < > 31.4*   < > 31.6*   < > 0*  38 31* 26*  MCV 88.1  --  87.7  --  86.8  --   --   --   --   PLT 332   < > 279   < > 255   < > 328  328 188 271   < > = values in this interval not displayed.   Lab Results  Component Value Date   TSH 2.195 01/19/2020   No results found for: HGBA1C Lab Results  Component Value Date   CHOL 191 07/14/2019   HDL 65 07/14/2019   LDLCALC 110 (H) 07/14/2019   TRIG 71 07/14/2019   CHOLHDL 2.9 07/14/2019    Significant Diagnostic Results in last 30 days:  No results found.  Assessment/Plan Venous stasis ulcer of left lower leg with edema of left lower leg (HCC) x2 silver dollar sized venous ulcers developed from previous skin tears, moderate amount of serosanguinous drainage seen, slightly warmth and redness in the areas. Will ally Alginate Ag dressing daily and prn, may consider ABT if s/s of infection develops.   Atrial fibrillation with rapid ventricular response (HCC) Heart rate is better controlled, continue Metoprolol, Diltiazem.   Essential hypertension Blood pressure runs low, she denied headache, dizziness, fatigue, chest pain/pressure, or palpitation. observe  Pressure ulcer Stage 3 sacral wound, saline packing, pending wound care center consultation.   Anemia Hgb 8.3, no active bleeding, continue Fe  Edema BLE, chronic, continue Torsemide qod, blood pressure runs low  Insomnia secondary to depression with anxiety Stable, continue Mirtazapine.    Family/ staff Communication: plan of care reviewed with the patient and charge nurse.   Labs/tests ordered:  none  Time spend 25 minutes.

## 2020-03-16 NOTE — Assessment & Plan Note (Signed)
Stage 3 sacral wound, saline packing, pending wound care center consultation.

## 2020-03-16 NOTE — Assessment & Plan Note (Signed)
BLE, chronic, continue Torsemide qod, blood pressure runs low

## 2020-03-16 NOTE — Assessment & Plan Note (Signed)
Heart rate is better controlled, continue Metoprolol, Diltiazem.

## 2020-03-18 ENCOUNTER — Encounter: Payer: Self-pay | Admitting: Nurse Practitioner

## 2020-03-18 LAB — MAGNESIUM: Magnesium: 1.6

## 2020-03-18 LAB — FOLATE: Folate: 9.9

## 2020-03-29 ENCOUNTER — Encounter (HOSPITAL_BASED_OUTPATIENT_CLINIC_OR_DEPARTMENT_OTHER): Payer: Medicare Other | Attending: Internal Medicine | Admitting: Internal Medicine

## 2020-03-29 DIAGNOSIS — D649 Anemia, unspecified: Secondary | ICD-10-CM | POA: Insufficient documentation

## 2020-03-29 DIAGNOSIS — K7460 Unspecified cirrhosis of liver: Secondary | ICD-10-CM | POA: Diagnosis not present

## 2020-03-29 DIAGNOSIS — I10 Essential (primary) hypertension: Secondary | ICD-10-CM | POA: Diagnosis not present

## 2020-03-29 DIAGNOSIS — Z888 Allergy status to other drugs, medicaments and biological substances status: Secondary | ICD-10-CM | POA: Insufficient documentation

## 2020-03-29 DIAGNOSIS — Z681 Body mass index (BMI) 19 or less, adult: Secondary | ICD-10-CM | POA: Insufficient documentation

## 2020-03-29 DIAGNOSIS — L89154 Pressure ulcer of sacral region, stage 4: Secondary | ICD-10-CM | POA: Diagnosis present

## 2020-03-29 DIAGNOSIS — H409 Unspecified glaucoma: Secondary | ICD-10-CM | POA: Insufficient documentation

## 2020-03-29 DIAGNOSIS — Z87891 Personal history of nicotine dependence: Secondary | ICD-10-CM | POA: Insufficient documentation

## 2020-03-29 DIAGNOSIS — Z8249 Family history of ischemic heart disease and other diseases of the circulatory system: Secondary | ICD-10-CM | POA: Diagnosis not present

## 2020-03-29 DIAGNOSIS — I4811 Longstanding persistent atrial fibrillation: Secondary | ICD-10-CM | POA: Diagnosis not present

## 2020-03-29 DIAGNOSIS — I714 Abdominal aortic aneurysm, without rupture: Secondary | ICD-10-CM | POA: Diagnosis not present

## 2020-03-29 DIAGNOSIS — Z882 Allergy status to sulfonamides status: Secondary | ICD-10-CM | POA: Diagnosis not present

## 2020-03-29 DIAGNOSIS — L97221 Non-pressure chronic ulcer of left calf limited to breakdown of skin: Secondary | ICD-10-CM | POA: Insufficient documentation

## 2020-03-30 NOTE — Progress Notes (Signed)
Terri Acosta, Terri Acosta ([id number]) Visit Report for 03/29/2020 Abuse/Suicide Risk Screen Details Patient Name: Date of Service: Terri Acosta, [name] IS Acosta. 03/29/2020 10:30 Acosta M Medical Record Number: [id number] Patient Account Number: [id number] Date of Birth/Sex: Treating RN: 11-20-1922 ([name]) Carlene Coria Primary Care Kamyra Schroeck: MA ST, MA Acosta Other Clinician: Referring Gaylyn Berish: Treating Rynlee Lisbon/Extender: Tobi Bastos MA ST, MA Acosta Weeks in Treatment: 0 Abuse/Suicide Risk Screen Items Answer ABUSE RISK SCREEN: Has anyone close to you tried to hurt or harm you recentlyo No Do you feel uncomfortable with anyone in your familyo No Has anyone forced you do things that you didnt want to doo No Electronic Signature(s) Signed: 03/30/2020 5:05:11 PM By: Carlene Coria RN Entered By: Carlene Coria on 03/29/2020 10:38:34 -------------------------------------------------------------------------------- Activities of Daily Living Details Patient Name: Date of Service: Terri Terri Acosta IS Acosta. 03/29/2020 10:30 Acosta M Medical Record Number: [id number] Patient Account Number: [id number] Date of Birth/Sex: Treating RN: 1922-12-09 ([name]) Carlene Coria Primary Care Maytal Mijangos: MA ST, MA Acosta Other Clinician: Referring Larenda Reedy: Treating Bianna Haran/Extender: Tobi Bastos MA ST, MA Acosta Weeks in Treatment: 0 Activities of Daily Living Items Answer Activities of Daily Living (Please select one for each item) Drive Automobile Not Able T Medications ake Need Assistance Use T elephone Need Assistance Care for Appearance Need Assistance Use T oilet Need Assistance Bath / Shower Need Assistance Dress Self Need Assistance Feed Self Completely Able Walk Not Able Get In / Out Bed Need Assistance Housework Not Able Prepare Meals Not Able Handle Money Need Assistance Shop for Self Not Able Electronic Signature(s) Signed: 03/30/2020 5:05:11 PM By: Carlene Coria RN Entered By: Carlene Coria on 03/29/2020  10:39:23 -------------------------------------------------------------------------------- Education Screening Details Patient Name: Date of Service: Terri Acosta, Terri Acosta. 03/29/2020 10:30 Acosta M Medical Record Number: [id number] Patient Account Number: [id number] Date of Birth/Sex: Treating RN: 08/25/1922 ([name]) Carlene Coria Primary Care Terri Acosta: MA ST, MA Acosta Other Clinician: Referring Kaydenn Mclear: Treating Naser Schuld/Extender: Tobi Bastos MA ST, MA Acosta Weeks in Treatment: 0 Primary Learner Assessed: Patient Learning Preferences/Education Level/Primary Language Learning Preference: Explanation Highest Education Level: College or Above Preferred Language: English Cognitive Barrier Language Barrier: No Translator Needed: No Memory Deficit: No Emotional Barrier: No Cultural/Religious Beliefs Affecting Medical Care: No Physical Barrier Impaired Vision: Yes Glasses Impaired Hearing: No Decreased Hand dexterity: No Knowledge/Comprehension Knowledge Level: Medium Comprehension Level: High Ability to understand written instructions: High Ability to understand verbal instructions: High Motivation Anxiety Level: Calm Cooperation: Cooperative Education Importance: Acknowledges Need Interest in Health Problems: Asks Questions Perception: Coherent Willingness to Engage in Self-Management High Activities: Readiness to Engage in Self-Management High Activities: Electronic Signature(s) Signed: 03/30/2020 5:05:11 PM By: Carlene Coria RN Entered By: Carlene Coria on 03/29/2020 10:40:32 -------------------------------------------------------------------------------- Fall Risk Assessment Details Patient Name: Date of Service: Terri Acosta, Terri Acosta. 03/29/2020 10:30 Acosta M Medical Record Number: [id number] Patient Account Number: [id number] Date of Birth/Sex: Treating RN: 08-22-1922 ([name]) Terri Acosta Primary Care Baraa Tubbs: MA ST, MA Acosta Other Clinician: Referring Jaycelyn Orrison: Treating  Fareed Fung/Extender: Tobi Bastos MA ST, MA Acosta Weeks in Treatment: 0 Fall Risk Assessment Items Have you had 2 or more falls in the last 12 monthso 0 No Have you had any fall that resulted in injury in the last 12 monthso 0 No FALLS RISK SCREEN History of falling - immediate or within 3 months 0 No Secondary diagnosis (Do you have 2 or more medical diagnoseso) 0 No Ambulatory aid None/bed rest/wheelchair/nurse 0  No Crutches/cane/walker 0 No Furniture 0 No Intravenous therapy Access/Saline/Heparin Lock 0 No Gait/Transferring Normal/ bed rest/ wheelchair 0 No Weak (short steps with or without shuffle, stooped but able to lift head while walking, may seek 0 No support from furniture) Impaired (short steps with shuffle, may have difficulty arising from chair, head down, impaired 0 No balance) Mental Status Oriented to own ability 0 No Electronic Signature(s) Signed: 03/30/2020 5:05:11 PM By: Carlene Coria RN Entered By: Carlene Coria on 03/29/2020 10:40:56 -------------------------------------------------------------------------------- Foot Assessment Details Patient Name: Date of Service: Terri Acosta, Terri Acosta. 03/29/2020 10:30 Acosta M Medical Record Number: [id number] Patient Account Number: [id number] Date of Birth/Sex: Treating RN: 06/07/1923 ([name]) [name], [place] Primary Care Terri Acosta: MA ST, MA Acosta Other Clinician: Referring Tuesday Terlecki: Treating Clydell Alberts/Extender: Tobi Bastos MA ST, MA Acosta Weeks in Treatment: 0 Foot Assessment Items Site Locations + = Sensation present, - = Sensation absent, C = Callus, U = Ulcer R = Redness, W = Warmth, M = Maceration, PU = Pre-ulcerative lesion F = Fissure, S = Swelling, D = Dryness Assessment Right: Left: Other Deformity: No No Prior Foot Ulcer: No No Prior Amputation: No No Charcot Joint: No No Ambulatory Status: Non-ambulatory Assistance Device: Wheelchair GaitEnergy manager) Signed: 03/30/2020 5:05:11 PM By: Carlene Coria RN Entered By: Carlene Coria on 03/29/2020 11:08:07 -------------------------------------------------------------------------------- Nutrition Risk Screening Details Patient Name: Date of Service: Terri [place], Terri Acosta. 03/29/2020 10:30 Acosta M Medical Record Number: [id number] Patient Account Number: [id number] Date of Birth/Sex: Treating RN: 1923-03-11 ([name]) [name], [place] Primary Care Freya Zobrist: MA ST, MA Acosta Other Clinician: Referring Ruthann Angulo: Treating Rhylynn Perdomo/Extender: Tobi Bastos MA ST, MA Acosta Weeks in Treatment: 0 Height (in): 64 Weight (lbs): 90 Body Mass Index (BMI): 15.4 Nutrition Risk Screening Items Score Screening NUTRITION RISK SCREEN: I have an illness or condition that made me change the kind and/or amount of food I eat 0 No I eat fewer than two meals per day 0 No I eat few fruits and vegetables, or milk products 0 No I have three or more drinks of beer, liquor or wine almost every day 0 No I have tooth or mouth problems that make it hard for me to eat 0 No I don't always have enough money to buy the food I need 0 No I eat alone most of the time 0 No I take three or more different prescribed or over-the-counter drugs Acosta day 1 Yes Without wanting to, I have lost or gained 10 pounds in the last six months 0 No I am not always physically able to shop, cook and/or feed myself 2 Yes Nutrition Protocols Good Risk Protocol Moderate Risk Protocol 0 Provide education on nutrition High Risk Proctocol Risk Level: Moderate Risk Score: 3 Electronic Signature(s) Signed: 03/30/2020 5:05:11 PM By: Carlene Coria RN Entered By: Carlene Coria on 03/29/2020 10:41:28

## 2020-03-30 NOTE — Progress Notes (Signed)
CHRISTOL, THETFORD ([id number]) Visit Report for 03/29/2020 Allergy List Details Patient Name: Date of Service: [name], [name] IS A. 03/29/2020 10:30 A M Medical Record Number: [id number] Patient Account Number: [id number] Date of Birth/Sex: Treating RN: [birth year] ([name]) Carlene Coria Primary Care Chestine Belknap: MA ST, MA N Other Clinician: Referring Kaliyan Osbourn: Treating Iban Utz/Extender: Tobi Bastos MA ST, MA N Weeks in Treatment: 0 Allergies Active Allergies amlodipine Lomotil Sulfa (Sulfonamide Antibiotics) Allergy Notes Electronic Signature(s) Signed: 03/30/2020 5:05:11 PM By: Carlene Coria RN Entered By: Carlene Coria on 03/29/2020 10:31:38 -------------------------------------------------------------------------------- Arrival Information Details Patient Name: Date of Service: [name], [name] 03/29/2020 10:30 A M Medical Record Number: [id number] Patient Account Number: [id number] Date of Birth/Sex: Treating RN: 1923/04/24 ([name]) Carlene Coria Primary Care Cheryln Balcom: MA ST, MA N Other Clinician: Referring Sheritha Louis: Treating Bessye Stith/Extender: Tobi Bastos MA ST, MA N Weeks in Treatment: 0 Visit Information Patient Arrived: Ambulatory Arrival Time: 10:26 Accompanied By: caregiver Transfer Assistance: None Patient Identification Verified: Yes Secondary Verification Process Completed: Yes Patient Requires Transmission-Based Precautions: No Patient Has Alerts: No Electronic Signature(s) Signed: 03/30/2020 5:05:11 PM By: Carlene Coria RN Entered By: Carlene Coria on 03/29/2020 10:27:05 -------------------------------------------------------------------------------- Clinic Level of Care Assessment Details Patient Name: Date of Service: HA [place], [name] 03/29/2020 10:30 A M Medical Record Number: [id number] Patient Account Number: [id number] Date of Birth/Sex: Treating RN: [birth year] ([name]) [name] Primary Care Nasya Vincent: MA ST, MA N Other  Clinician: Referring Samani Deal: Treating Yaritzi Craun/Extender: Tobi Bastos MA ST, MA N Weeks in Treatment: 0 Clinic Level of Care Assessment Items TOOL 2 Quantity Score X- 1 0 Use when only an EandM is performed on the INITIAL visit ASSESSMENTS - Nursing Assessment / Reassessment X- 1 20 General Physical Exam (combine w/ comprehensive assessment (listed just below) when performed on new pt. evals) X- 1 25 Comprehensive Assessment (HX, ROS, Risk Assessments, Wounds Hx, etc.) ASSESSMENTS - Wound and Skin A ssessment / Reassessment []  - 0 Simple Wound Assessment / Reassessment - one wound X- 4 5 Complex Wound Assessment / Reassessment - multiple wounds []  - 0 Dermatologic / Skin Assessment (not related to wound area) ASSESSMENTS - Ostomy and/or Continence Assessment and Care []  - 0 Incontinence Assessment and Management []  - 0 Ostomy Care Assessment and Management (repouching, etc.) PROCESS - Coordination of Care X - Simple Patient / Family Education for ongoing care 1 15 []  - 0 Complex (extensive) Patient / Family Education for ongoing care X- 1 10 Staff obtains Programmer, systems, Records, T Results / Process Orders est X- 1 10 Staff telephones HHA, Nursing Homes / Clarify orders / etc []  - 0 Routine Transfer to another Facility (non-emergent condition) []  - 0 Routine Hospital Admission (non-emergent condition) X- 1 15 New Admissions / Biomedical engineer / Ordering NPWT Apligraf, etc. , []  - 0 Emergency Hospital Admission (emergent condition) X- 1 10 Simple Discharge Coordination []  - 0 Complex (extensive) Discharge Coordination PROCESS - Special Needs []  - 0 Pediatric / Minor Patient Management []  - 0 Isolation Patient Management []  - 0 Hearing / Language / Visual special needs []  - 0 Assessment of Community assistance (transportation, D/C planning, etc.) []  - 0 Additional assistance / Altered mentation []  - 0 Support Surface(s) Assessment (bed, cushion, seat,  etc.) INTERVENTIONS - Wound Cleansing / Measurement X- 1 5 Wound Imaging (photographs - any number of wounds) []  - 0 Wound Tracing (instead of photographs) []  - 0 Simple Wound Measurement - one wound X-  4 5 Complex Wound Measurement - multiple wounds []  - 0 Simple Wound Cleansing - one wound X- 4 5 Complex Wound Cleansing - multiple wounds INTERVENTIONS - Wound Dressings X - Small Wound Dressing one or multiple wounds 1 10 []  - 0 Medium Wound Dressing one or multiple wounds X- 1 20 Large Wound Dressing one or multiple wounds []  - 0 Application of Medications - injection INTERVENTIONS - Miscellaneous []  - 0 External ear exam []  - 0 Specimen Collection (cultures, biopsies, blood, body fluids, etc.) []  - 0 Specimen(s) / Culture(s) sent or taken to Lab for analysis []  - 0 Patient Transfer (multiple staff / Civil Service fast streamer / Similar devices) []  - 0 Simple Staple / Suture removal (25 or less) []  - 0 Complex Staple / Suture removal (26 or more) []  - 0 Hypo / Hyperglycemic Management (close monitor of Blood Glucose) X- 1 15 Ankle / Brachial Index (ABI) - do not check if billed separately Has the patient been seen at the hospital within the last three years: Yes Total Score: 215 Level Of Care: New/Established - Level 5 Electronic Signature(s) Signed: 03/29/2020 5:19:36 PM By: [name] RN, BSN Entered By: [name] on 03/29/2020 13:02:39 -------------------------------------------------------------------------------- Lower Extremity Assessment Details Patient Name: Date of Service: [name], [name] 03/29/2020 10:30 A M Medical Record Number: [id number] Patient Account Number: [id number] Date of Birth/Sex: Treating RN: Jun 24, 1923 ([name]) [name], [place] Primary Care Raigan Baria: MA ST, MA N Other Clinician: Referring Khalel Alms: Treating Seletha Zimmermann/Extender: Tobi Bastos MA ST, MA N Weeks in Treatment: 0 Edema Assessment Assessed: [Left: No] [Right: No] Edema:  [Left: Ye] [Right: s] Calf Left: Right: Point of Measurement: 38 cm From Medial Instep 28 cm cm Ankle Left: Right: Point of Measurement: 11 cm From Medial Instep 20 cm cm Vascular Assessment Blood Pressure: Brachial: [Left:130] Ankle: [Left:Dorsalis Pedis: 80 0.62] Electronic Signature(s) Signed: 03/30/2020 5:05:11 PM By: Carlene Coria RN Entered By: Carlene Coria on 03/29/2020 11:09:23 -------------------------------------------------------------------------------- Multi-Disciplinary Care Plan Details Patient Name: Date of Service: [name], [name] 03/29/2020 10:30 A M Medical Record Number: [id number] Patient Account Number: [id number] Date of Birth/Sex: Treating RN: 1923/08/06 ([name]) [name] Primary Care Sheyann Sulton: MA ST, MA N Other Clinician: Referring Krista Som: Treating Mikel Pyon/Extender: Tobi Bastos MA ST, MA N Weeks in Treatment: 0 Active Inactive Abuse / Safety / Falls / Self Care Management Nursing Diagnoses: Potential for falls Potential for injury related to falls Goals: Patient will not experience any injury related to falls Date Initiated: 03/29/2020 Target Resolution Date: 04/30/2020 Goal Status: Active Patient/caregiver will verbalize/demonstrate measures taken to prevent injury and/or falls Date Initiated: 03/29/2020 Target Resolution Date: 04/30/2020 Goal Status: Active Interventions: Assess Activities of Daily Living upon admission and as needed Assess fall risk on admission and as needed Assess: immobility, friction, shearing, incontinence upon admission and as needed Assess impairment of mobility on admission and as needed per policy Assess personal safety and home safety (as indicated) on admission and as needed Assess self care needs on admission and as needed Provide education on fall prevention Provide education on personal and home safety Notes: Pressure Nursing Diagnoses: Knowledge deficit related to causes and risk factors  for pressure ulcer development Knowledge deficit related to management of pressures ulcers Potential for impaired tissue integrity related to pressure, friction, moisture, and shear Goals: Patient/caregiver will verbalize risk factors for pressure ulcer development Date Initiated: 03/29/2020 Target Resolution Date: 04/30/2020 Goal Status: Active Patient/caregiver will verbalize understanding of pressure ulcer management  Date Initiated: 03/29/2020 Target Resolution Date: 04/30/2020 Goal Status: Active Interventions: Assess: immobility, friction, shearing, incontinence upon admission and as needed Assess offloading mechanisms upon admission and as needed Assess potential for pressure ulcer upon admission and as needed Provide education on pressure ulcers Notes: Wound/Skin Impairment Nursing Diagnoses: Impaired tissue integrity Knowledge deficit related to ulceration/compromised skin integrity Goals: Patient/caregiver will verbalize understanding of skin care regimen Date Initiated: 03/29/2020 Target Resolution Date: 04/30/2020 Goal Status: Active Interventions: Assess patient/caregiver ability to obtain necessary supplies Assess patient/caregiver ability to perform ulcer/skin care regimen upon admission and as needed Assess ulceration(s) every visit Provide education on ulcer and skin care Notes: Electronic Signature(s) Signed: 03/29/2020 5:19:36 PM By: [name] RN, BSN Entered By: [name] on 03/29/2020 16:39:48 -------------------------------------------------------------------------------- Patient/Caregiver Education Details Patient Name: Date of Service: HA NSO Delane Ginger, [name] 8/9/2021andnbsp10:30 A M Medical Record Number: [id number] Patient Account Number: [id number] Date of Birth/Gender: Treating RN: 07/25/1923 ([name]) [name] Primary Care Physician: MA ST, MA N Other Clinician: Referring Physician: Treating Physician/Extender: Tobi Bastos MA ST,  MA N Weeks in Treatment: 0 Education Assessment Education Provided To: Patient Education Topics Provided Wound/Skin Impairment: Methods: Explain/Verbal Responses: State content correctly Electronic Signature(s) Signed: 03/29/2020 5:19:36 PM By: [name] RN, BSN Entered By: [name] on 03/29/2020 16:39:55 -------------------------------------------------------------------------------- Wound Assessment Details Patient Name: Date of Service: HA [place], [name] 03/29/2020 10:30 A M Medical Record Number: [id number] Patient Account Number: [id number] Date of Birth/Sex: Treating RN: May 28, 1923 ([name]) [name], [place] Primary Care Daton Szilagyi: MA ST, MA N Other Clinician: Referring Lovada Barwick: Treating Codylee Patil/Extender: Tobi Bastos MA ST, MA N Weeks in Treatment: 0 Wound Status Wound Number: 1 Primary Etiology: Skin Tear Wound Location: Left, Lateral Lower Leg Wound Status: Open Wounding Event: Trauma Comorbid History: Glaucoma, Hypertension Date Acquired: 03/16/2020 Weeks Of Treatment: 0 Clustered Wound: No Photos Photo Uploaded By: Mikeal Hawthorne on 03/30/2020 08:25:43 Wound Measurements Length: (cm) 0.9 Width: (cm) 1.1 Depth: (cm) 0.1 Area: (cm) 0.778 Volume: (cm) 0.078 % Reduction in Area: % Reduction in Volume: Epithelialization: None Tunneling: No Undermining: No Wound Description Classification: Full Thickness Without Exposed Support Structures Exudate Amount: Medium Exudate Type: Serosanguineous Exudate Color: red, brown Foul Odor After Cleansing: No Slough/Fibrino Yes Wound Bed Granulation Amount: Medium (34-66%) Exposed Structure Granulation Quality: Red Fascia Exposed: No Necrotic Amount: Medium (34-66%) Fat Layer (Subcutaneous Tissue) Exposed: Yes Necrotic Quality: Adherent Slough Tendon Exposed: No Muscle Exposed: No Joint Exposed: No Bone Exposed: No Electronic Signature(s) Signed: 03/30/2020 5:05:11 PM By: Carlene Coria  RN Entered By: Carlene Coria on 03/29/2020 10:51:17 -------------------------------------------------------------------------------- Wound Assessment Details Patient Name: Date of Service: [name], [name] 03/29/2020 10:30 A M Medical Record Number: [id number] Patient Account Number: [id number] Date of Birth/Sex: Treating RN: Feb 02, 1923 ([name]) [name], [place] Primary Care Kishan Wachsmuth: MA ST, MA N Other Clinician: Referring Andric Kerce: Treating Efrain Clauson/Extender: Tobi Bastos MA ST, MA N Weeks in Treatment: 0 Wound Status Wound Number: 2 Primary Etiology: Skin Tear Wound Location: Left, Distal, Lateral Lower Leg Wound Status: Open Wounding Event: Trauma Comorbid History: Glaucoma, Hypertension Date Acquired: 03/16/2020 Weeks Of Treatment: 0 Clustered Wound: No Photos Photo Uploaded By: Mikeal Hawthorne on 03/30/2020 08:25:28 Wound Measurements Length: (cm) 0.5 Width: (cm) 0.4 Depth: (cm) 0.1 Area: (cm) 0.157 Volume: (cm) 0.016 % Reduction in Area: % Reduction in Volume: Epithelialization: None Tunneling: No Undermining: No Wound Description Classification: Full Thickness Without Exposed Support Structures Exudate Amount: Medium Exudate Type: Serosanguineous Exudate Color: red,  brown Foul Odor After Cleansing: No Slough/Fibrino Yes Wound Bed Granulation Amount: Medium (34-66%) Exposed Structure Granulation Quality: Red Fascia Exposed: No Necrotic Amount: Medium (34-66%) Fat Layer (Subcutaneous Tissue) Exposed: Yes Necrotic Quality: Adherent Slough Tendon Exposed: No Muscle Exposed: No Joint Exposed: No Bone Exposed: No Electronic Signature(s) Signed: 03/30/2020 5:05:11 PM By: Carlene Coria RN Entered By: Carlene Coria on 03/29/2020 10:53:08 -------------------------------------------------------------------------------- Wound Assessment Details Patient Name: Date of Service: [name], [name] 03/29/2020 10:30 A M Medical Record Number: [id number] Patient  Account Number: [id number] Date of Birth/Sex: Treating RN: 1923/04/30 ([name]) [name], [place] Primary Care Jazzlene Huot: MA ST, MA N Other Clinician: Referring Danitza Schoenfeldt: Treating Marcy Bogosian/Extender: Tobi Bastos MA ST, MA N Weeks in Treatment: 0 Wound Status Wound Number: 3 Primary Etiology: Skin Tear Wound Location: Left, Posterior Lower Leg Wound Status: Open Wounding Event: Gradually Appeared Comorbid History: Glaucoma, Hypertension Date Acquired: 03/16/2020 Weeks Of Treatment: 0 Clustered Wound: No Photos Photo Uploaded By: Mikeal Hawthorne on 03/30/2020 08:25:55 Wound Measurements Length: (cm) 4.5 Width: (cm) 3 Depth: (cm) 0.1 Area: (cm) 10.603 Volume: (cm) 1.06 % Reduction in Area: % Reduction in Volume: Epithelialization: None Tunneling: No Undermining: No Wound Description Classification: Full Thickness Without Exposed Support Structures Exudate Amount: Medium Exudate Type: Serosanguineous Exudate Color: red, brown Foul Odor After Cleansing: No Slough/Fibrino Yes Wound Bed Granulation Amount: Medium (34-66%) Exposed Structure Granulation Quality: Red Fascia Exposed: No Necrotic Amount: Medium (34-66%) Fat Layer (Subcutaneous Tissue) Exposed: Yes Necrotic Quality: Adherent Slough Tendon Exposed: No Muscle Exposed: No Joint Exposed: No Bone Exposed: No Electronic Signature(s) Signed: 03/30/2020 5:05:11 PM By: Carlene Coria RN Entered By: Carlene Coria on 03/29/2020 10:55:27 -------------------------------------------------------------------------------- Wound Assessment Details Patient Name: Date of Service: [name], [name] 03/29/2020 10:30 A M Medical Record Number: [id number] Patient Account Number: [id number] Date of Birth/Sex: Treating RN: 1922/11/05 ([name]) [name], [place] Primary Care Ivan Maskell: MA ST, MA N Other Clinician: Referring Verne Lanuza: Treating Nikesh Teschner/Extender: Tobi Bastos MA ST, MA N Weeks in Treatment: 0 Wound  Status Wound Number: 4 Primary Etiology: Pressure Ulcer Wound Location: Sacrum Wound Status: Open Wounding Event: Gradually Appeared Comorbid History: Glaucoma, Hypertension Date Acquired: 03/29/2020 Weeks Of Treatment: 0 Clustered Wound: No Photos Photo Uploaded By: Mikeal Hawthorne on 03/30/2020 08:26:08 Wound Measurements Length: (cm) 2.5 Width: (cm) 3 Depth: (cm) 1.8 Area: (cm) 5.89 Volume: (cm) 10.603 % Reduction in Area: % Reduction in Volume: Epithelialization: None Tunneling: No Undermining: Yes Starting Position (o'clock): 12 Ending Position (o'clock): 4 Maximum Distance: (cm) 1.6 Wound Description Classification: Category/Stage IV Exudate Amount: Medium Exudate Type: Serosanguineous Exudate Color: red, brown Foul Odor After Cleansing: No Slough/Fibrino Yes Wound Bed Granulation Amount: Small (1-33%) Exposed Structure Granulation Quality: Pink Fascia Exposed: No Necrotic Amount: Large (67-100%) Fat Layer (Subcutaneous Tissue) Exposed: Yes Necrotic Quality: Adherent Slough Tendon Exposed: No Muscle Exposed: No Joint Exposed: No Bone Exposed: Yes Electronic Signature(s) Signed: 03/30/2020 5:05:11 PM By: Carlene Coria RN Entered By: Carlene Coria on 03/29/2020 10:57:30 -------------------------------------------------------------------------------- Vitals Details Patient Name: Date of Service: [name], [name] 03/29/2020 10:30 A M Medical Record Number: [id number] Patient Account Number: [id number] Date of Birth/Sex: Treating RN: 12-19-1922 ([name]) [name], [place] Primary Care Safari Cinque: MA ST, MA N Other Clinician: Referring Jessup Ogas: Treating Lamia Mariner/Extender: Tobi Bastos MA ST, MA N Weeks in Treatment: 0 Vital Signs Time Taken: 10:27 Temperature (F): 98.3 Height (in): 64 Pulse (bpm): 112 Source: Stated Respiratory Rate (breaths/min): 18 Weight (lbs): 90 Blood Pressure (mmHg): 130/89 Body  Mass Index (BMI): 15.4 Reference Range: 80 -  120 mg / dl Electronic Signature(s) Signed: 03/30/2020 5:05:11 PM By: Carlene Coria RN Entered By: Carlene Coria on 03/29/2020 10:29:59

## 2020-03-30 NOTE — Progress Notes (Signed)
Terri Acosta, Terri Acosta ([id number]) Visit Report for 03/29/2020 Chief Complaint Document Details Patient Name: Date of Service: Terri Acosta, [name] Acosta. 03/29/2020 10:30 Acosta M Medical Record Number: [id number] Patient Account Number: [id number] Date of Birth/Sex: Treating RN: 01-24-1923 ([name]) Terri Acosta Primary Care Provider: MA ST, MA Acosta Other Clinician: Referring Provider: Treating Provider/Extender: Tobi Bastos MA ST, MA Acosta Weeks in Treatment: 0 Information Obtained from: Patient Chief Complaint Sacral wound Electronic Signature(s) Signed: 03/29/2020 11:44:12 AM By: Tobi Bastos MD, MBA Entered By: Tobi Bastos on 03/29/2020 11:44:12 -------------------------------------------------------------------------------- HPI Details Patient Name: Date of Service: Terri Acosta, Terri Acosta. 03/29/2020 10:30 Acosta M Medical Record Number: [id number] Patient Account Number: [id number] Date of Birth/Sex: Treating RN: 03-02-1923 ([name]) Terri Acosta Primary Care Provider: MA ST, MA Acosta Other Clinician: Referring Provider: Treating Provider/Extender: Tobi Bastos MA ST, MA Acosta Weeks in Treatment: 0 History of Present Illness HPI Description: 86 year old female who was admitted in late May and discharged early June for suspected infectious diarrhea and after undergoing inpatient management with hydration, IV antibiotics, she was released to skilled nursing facility for recovery, during this time she developed Acosta sacral wound that was classified as stage III for which they have been using Santyl and gauze packing with saline with daily changes. She also developed left leg skin tears and then shallow wounds that were addressed with silver alginate dressings. Patient originally is from assisted living and owing to her acute illness in June was transition to skilled nursing and the intention is for her to go back to assisted living once her recovery is complete. Patient denies any significant pain or  discomfort in the sacrum or the leg areas. Intake and appetite have been good, denies any diarrhea or constipation. She denies any fevers or chills. Patient has atrial fibrillation with irregular heart rhythm with frequent episodes of tachycardia in the 100-120 range, she also runs borderline low blood pressures. Other medical problems includes chronic anemia latest hemoglobin 8.3 she is on iron supplements, mild anasarca with cirrhosis identified per CT scan of the abdomen, infrarenal AAA with partial clot 3.5 cm size. ABI in the clinic was difficult to obtain owing to edema in the leg, marked at 0.62, note that CT scan performed in the hospital showed significant atherosclerotic disease in the major arteries Electronic Signature(s) Signed: 03/29/2020 11:48:31 AM By: Tobi Bastos MD, MBA Entered By: Tobi Bastos on 03/29/2020 11:48:31 -------------------------------------------------------------------------------- Physical Exam Details Patient Name: Date of Service: Terri Acosta, Terri Acosta. 03/29/2020 10:30 Acosta M Medical Record Number: [id number] Patient Account Number: [id number] Date of Birth/Sex: Treating RN: 1923-06-05 ([name]) Terri Acosta Primary Care Provider: MA ST, MA Acosta Other Clinician: Referring Provider: Treating Provider/Extender: Tobi Bastos MA ST, MA Acosta Weeks in Treatment: 0 Constitutional alert and oriented x 3. sitting or standing blood pressure is within target range for patient.. supine blood pressure is within target range for patient.. pulse regular and within target range for patient.[name] respirations regular, non-labored and within target range for patient.[name] temperature within target range for patient.. . . Well- nourished and well-hydrated in no acute distress. Eyes conjunctiva injected, Ectropion noted. pupils equal round and reactive to light and accommodation. Ears, Nose, Mouth, and Throat no gross abnormality of ear auricles or external auditory canals.  normal hearing noted during conversation. mucus membranes moist. Neck supple with no LAD noted in anterior or posterior cervical chain. not enlarged. Respiratory normal breathing without difficulty. clear to auscultation bilaterally. Cardiovascular  irregular rate and rhythm with normal S1, S2. no bruits with no significant JVD. 2+ femoral pulses. 2+ dorsalis pedis/posterior tibialis pulses. no clubbing, cyanosis, significant edema, <3 sec cap refill. Gastrointestinal (GI) soft, non-tender, non-distended, +BS. no hepatosplenomegaly. no ventral hernia noted. Neurological cranial nerves 2-12 intact. Patient has normal sensation in the feet bilaterally to light touch. Psychiatric this patient is able to make decisions and demonstrates good insight into disease process. Alert and Oriented x 3. pleasant and cooperative. Notes Left lower extremity with scaling and dry skin, shallow ulcers noted on the lateral aspect distal, sacral stage III wound with probing to muscle, also rim appears to be smooth and slightly rolled edges, ulcer base appears to be without any major slough some of which I try to debride with Acosta curette there is pale granulation tissue noted. No tunneling noted Electronic Signature(s) Signed: 03/29/2020 11:50:42 AM By: Tobi Bastos MD, MBA Entered By: Tobi Bastos on 03/29/2020 11:50:40 -------------------------------------------------------------------------------- Physician Orders Details Patient Name: Date of Service: Terri Acosta, Terri Acosta. 03/29/2020 10:30 Acosta M Medical Record Number: [id number] Patient Account Number: [id number] Date of Birth/Sex: Treating RN: Mar 24, 1923 ([name]) Terri Acosta Primary Care Provider: MA ST, MA Acosta Other Clinician: Referring Provider: Treating Provider/Extender: Tobi Bastos MA ST, MA Acosta Weeks in Treatment: 0 Verbal / Phone Orders: No Diagnosis Coding Follow-up Appointments Return Appointment in 1 week. Dressing Change  Frequency Change dressing every day. - all wounds Skin Barriers/Peri-Wound Care Moisturizing lotion - left leg daily Wound Cleansing Clean wound with Normal Saline. - or wound cleanser Primary Wound Dressing Wound #4 Sacrum Santyl Ointment - fill space with saline moistened gauze Wound #1 Left,Lateral Lower Leg Calcium Alginate with Silver Wound #2 Left,Distal,Lateral Lower Leg Calcium Alginate with Silver Wound #3 Left,Posterior Lower Leg Calcium Alginate with Silver Secondary Dressing Wound #4 Sacrum Foam Border Wound #1 Left,Lateral Lower Leg Dry Gauze Wound #2 Left,Distal,Lateral Lower Leg Dry Gauze Wound #3 Left,Posterior Lower Leg Dry Gauze Edema Control Kerlix and Coban - Left Lower Extremity Elevate legs to the level of the heart or above for 30 minutes daily and/or when sitting, Acosta frequency of: - throughout the day Radiology X-ray, coccyx - Non healing pressure ulcer Electronic Signature(s) Signed: 03/29/2020 4:24:36 PM By: Tobi Bastos MD, MBA Signed: 03/29/2020 5:19:36 PM By: Terri Hurst RN, BSN Entered By: Terri Acosta on 03/29/2020 11:46:04 Prescription 03/29/2020 -------------------------------------------------------------------------------- Kennith Gain, Kely Acosta. Tobi Bastos MD Patient Name: Provider: March 26, 1923 [id number] Date of Birth: NPI#: Female [id number] Sex: DEA #: [phone] Phone #: License #: [place] Patient Address: [place] [address] [place] [address] [place], [place] [address] [phone] Allergies amlodipine; Lomotil; Sulfa (Sulfonamide Antibiotics) Provider's Orders X-ray, coccyx - Non healing pressure ulcer Hand Signature: Date(s): Electronic Signature(s) Signed: 03/29/2020 4:24:36 PM By: Tobi Bastos MD, MBA Signed: 03/29/2020 5:19:36 PM By: Terri Hurst RN, BSN Entered By: Terri Acosta on 03/29/2020  11:46:05 -------------------------------------------------------------------------------- Problem List Details Patient Name: Date of Service: Terri Acosta, Terri Acosta. 03/29/2020 10:30 Acosta M Medical Record Number: [id number] Patient Account Number: [id number] Date of Birth/Sex: Treating RN: 1922-11-11 ([name]) Terri Acosta Primary Care Provider: MA ST, MA Acosta Other Clinician: Referring Provider: Treating Provider/Extender: Tobi Bastos MA ST, MA Acosta Weeks in Treatment: 0 Active Problems ICD-10 Encounter Code Description Active Date MDM Diagnosis L89.153 Pressure ulcer of sacral region, stage 3 03/29/2020 No Yes L97.221 Non-pressure chronic ulcer of left calf limited to  breakdown of skin 03/29/2020 No Yes I48.11 Longstanding persistent atrial fibrillation 03/29/2020 No Yes Inactive Problems Resolved Problems Electronic Signature(s) Signed: 03/29/2020 11:42:47 AM By: Tobi Bastos MD, MBA Entered By: Tobi Bastos on 03/29/2020 11:42:47 -------------------------------------------------------------------------------- Progress Note Details Patient Name: Date of Service: Terri Acosta, Terri Acosta. 03/29/2020 10:30 Acosta M Medical Record Number: [id number] Patient Account Number: [id number] Date of Birth/Sex: Treating RN: 10-03-1922 ([name]) Terri Acosta Primary Care Provider: MA ST, MA Acosta Other Clinician: Referring Provider: Treating Provider/Extender: Tobi Bastos MA ST, MA Acosta Weeks in Treatment: 0 Subjective Chief Complaint Information obtained from Patient Sacral wound History of Present Illness (HPI) 86 year old female who was admitted in late May and discharged early June for suspected infectious diarrhea and after undergoing inpatient management with hydration, IV antibiotics, she was released to skilled nursing facility for recovery, during this time she developed Acosta sacral wound that was classified as stage III for which they have been using Santyl and gauze packing with  saline with daily changes. She also developed left leg skin tears and then shallow wounds that were addressed with silver alginate dressings. Patient originally is from assisted living and owing to her acute illness in June was transition to skilled nursing and the intention is for her to go back to assisted living once her recovery is complete. Patient denies any significant pain or discomfort in the sacrum or the leg areas. Intake and appetite have been good, denies any diarrhea or constipation. She denies any fevers or chills. Patient has atrial fibrillation with irregular heart rhythm with frequent episodes of tachycardia in the 100-120 range, she also runs borderline low blood pressures. Other medical problems includes chronic anemia latest hemoglobin 8.3 she is on iron supplements, mild anasarca with cirrhosis identified per CT scan of the abdomen, infrarenal AAA with partial clot 3.5 cm size. ABI in the clinic was difficult to obtain owing to edema in the leg, marked at 0.62, note that CT scan performed in the hospital showed significant atherosclerotic disease in the major arteries Patient History Information obtained from Patient. Allergies amlodipine, Lomotil, Sulfa (Sulfonamide Antibiotics) Family History Hypertension - Father, Tuberculosis - Paternal Grandparents, No family history of Cancer, Diabetes, Heart Disease, Hereditary Spherocytosis, Kidney Disease, Lung Disease, Seizures, Stroke, Thyroid Problems. Social History Former smoker, Marital Status - Widowed, Alcohol Use - Daily, Drug Use - No History, Caffeine Use - Daily. Medical History Eyes Patient has history of Glaucoma Denies history of Cataracts, Optic Neuritis Ear/Nose/Mouth/Throat Denies history of Chronic sinus problems/congestion, Middle ear problems Hematologic/Lymphatic Denies history of Anemia, Hemophilia, Human Immunodeficiency Virus, Lymphedema, Sickle Cell Disease Respiratory Denies history of Aspiration,  Asthma, Chronic Obstructive Pulmonary Disease (COPD), Pneumothorax, Sleep Apnea, Tuberculosis Cardiovascular Patient has history of Hypertension Denies history of Angina, Arrhythmia, Congestive Heart Failure, Coronary Artery Disease, Deep Vein Thrombosis, Hypotension, Myocardial Infarction, Peripheral Arterial Disease, Peripheral Venous Disease, Phlebitis, Vasculitis Gastrointestinal Denies history of Cirrhosis , Colitis, Crohnoos, Hepatitis Acosta, Hepatitis B, Hepatitis C Endocrine Denies history of Type I Diabetes, Type II Diabetes Genitourinary Denies history of End Stage Renal Disease Immunological Denies history of Lupus Erythematosus, Raynaudoos, Scleroderma Integumentary (Skin) Denies history of History of Burn Musculoskeletal Denies history of Gout, Rheumatoid Arthritis, Osteoarthritis, Osteomyelitis Neurologic Denies history of Dementia, Neuropathy, Quadriplegia, Paraplegia, Seizure Disorder Oncologic Denies history of Received Chemotherapy, Received Radiation Psychiatric Denies history of Anorexia/bulimia, Confinement Anxiety Review of Systems (ROS) Constitutional Symptoms (General Health) Denies complaints or symptoms of Fatigue, Fever, Chills, Marked Weight Change. Eyes Denies complaints or symptoms  of Dry Eyes, Vision Changes, Glasses / Contacts. Ear/Nose/Mouth/Throat Denies complaints or symptoms of Chronic sinus problems or rhinitis. Respiratory Denies complaints or symptoms of Chronic or frequent coughs, Shortness of Breath. Cardiovascular Denies complaints or symptoms of Chest pain. Gastrointestinal Denies complaints or symptoms of Frequent diarrhea, Nausea, Vomiting. Endocrine Denies complaints or symptoms of Heat/cold intolerance. Genitourinary Denies complaints or symptoms of Frequent urination. Integumentary (Skin) Complains or has symptoms of Wounds. Musculoskeletal Denies complaints or symptoms of Muscle Pain, Muscle Weakness. Neurologic Denies  complaints or symptoms of Numbness/parasthesias. Psychiatric Denies complaints or symptoms of Claustrophobia, Suicidal. Objective Constitutional alert and oriented x 3. sitting or standing blood pressure is within target range for patient.. supine blood pressure is within target range for patient.. pulse regular and within target range for patient.[name] respirations regular, non-labored and within target range for patient.[name] temperature within target range for patient.. Well- nourished and well-hydrated in no acute distress. Vitals Time Taken: 10:27 AM, Height: 64 in, Source: Stated, Weight: 90 lbs, BMI: 15.4, Temperature: 98.3 F, Pulse: 112 bpm, Respiratory Rate: 18 breaths/min, Blood Pressure: 130/89 mmHg. Eyes conjunctiva injected, Ectropion noted. pupils equal round and reactive to light and accommodation. Ears, Nose, Mouth, and Throat no gross abnormality of ear auricles or external auditory canals. normal hearing noted during conversation. mucus membranes moist. Neck supple with no LAD noted in anterior or posterior cervical chain. not enlarged. Respiratory normal breathing without difficulty. clear to auscultation bilaterally. Cardiovascular irregular rate and rhythm with normal S1, S2. no bruits with no significant JVD. 2+ femoral pulses. 2+ dorsalis pedis/posterior tibialis pulses. no clubbing, cyanosis, significant edema, Gastrointestinal (GI) soft, non-tender, non-distended, +BS. no hepatosplenomegaly. no ventral hernia noted. Neurological cranial nerves 2-12 intact. Patient has normal sensation in the feet bilaterally to light touch. Psychiatric this patient is able to make decisions and demonstrates good insight into disease process. Alert and Oriented x 3. pleasant and cooperative. General Notes: Left lower extremity with scaling and dry skin, shallow ulcers noted on the lateral aspect distal, sacral stage III wound with probing to muscle, also rim appears to be smooth and  slightly rolled edges, ulcer base appears to be without any major slough some of which I try to debride with Acosta curette there is pale granulation tissue noted. No tunneling noted Integumentary (Hair, Skin) Wound #1 status is Open. Original cause of wound was Trauma. The wound is located on the Left,Lateral Lower Leg. The wound measures 0.9cm length x 1.1cm width x 0.1cm depth; 0.778cm^2 area and 0.078cm^3 volume. There is Fat Layer (Subcutaneous Tissue) Exposed exposed. There is no tunneling or undermining noted. There is Acosta medium amount of serosanguineous drainage noted. There is medium (34-66%) red granulation within the wound bed. There is Acosta medium (34-66%) amount of necrotic tissue within the wound bed including Adherent Slough. Wound #2 status is Open. Original cause of wound was Trauma. The wound is located on the Left,Distal,Lateral Lower Leg. The wound measures 0.5cm length x 0.4cm width x 0.1cm depth; 0.157cm^2 area and 0.016cm^3 volume. There is Fat Layer (Subcutaneous Tissue) Exposed exposed. There is no tunneling or undermining noted. There is Acosta medium amount of serosanguineous drainage noted. There is medium (34-66%) red granulation within the wound bed. There is Acosta medium (34-66%) amount of necrotic tissue within the wound bed including Adherent Slough. Wound #3 status is Open. Original cause of wound was Gradually Appeared. The wound is located on the Left,Posterior Lower Leg. The wound measures 4.5cm length x 3cm width x 0.1cm depth; 10.603cm^2 area  and 1.06cm^3 volume. There is Fat Layer (Subcutaneous Tissue) Exposed exposed. There is no tunneling or undermining noted. There is Acosta medium amount of serosanguineous drainage noted. There is medium (34-66%) red granulation within the wound bed. There is Acosta medium (34-66%) amount of necrotic tissue within the wound bed including Adherent Slough. Wound #4 status is Open. Original cause of wound was Gradually Appeared. The wound is located on  the Sacrum. The wound measures 2.5cm length x 3cm width x 1.8cm depth; 5.89cm^2 area and 10.603cm^3 volume. There is bone and Fat Layer (Subcutaneous Tissue) Exposed exposed. There is no tunneling noted, however, there is undermining starting at 12:00 and ending at 4:00 with Acosta maximum distance of 1.6cm. There is Acosta medium amount of serosanguineous drainage noted. There is small (1-33%) pink granulation within the wound bed. There is Acosta large (67-100%) amount of necrotic tissue within the wound bed including Adherent Slough. Assessment Active Problems ICD-10 Pressure ulcer of sacral region, stage 3 Non-pressure chronic ulcer of left calf limited to breakdown of skin Longstanding persistent atrial fibrillation Plan Follow-up Appointments: Return Appointment in 1 week. Dressing Change Frequency: Change dressing every day. - all wounds Skin Barriers/Peri-Wound Care: Moisturizing lotion - left leg daily Wound Cleansing: Clean wound with Normal Saline. - or wound cleanser Primary Wound Dressing: Wound #4 Sacrum: Santyl Ointment - fill space with saline moistened gauze Wound #1 Left,Lateral Lower Leg: Calcium Alginate with Silver Wound #2 Left,Distal,Lateral Lower Leg: Calcium Alginate with Silver Wound #3 Left,Posterior Lower Leg: Calcium Alginate with Silver Secondary Dressing: Wound #4 Sacrum: Foam Border Wound #1 Left,Lateral Lower Leg: Dry Gauze Wound #2 Left,Distal,Lateral Lower Leg: Dry Gauze Wound #3 Left,Posterior Lower Leg: Dry Gauze Edema Control: Kerlix and Coban - Left Lower Extremity Elevate legs to the level of the heart or above for 30 minutes daily and/or when sitting, Acosta frequency of: - throughout the day Radiology ordered were: X-ray, coccyx - Non healing pressure ulcer -Patient will be continued on silver alginate to the leg wounds on the left, with light compression initiated here today -Agree with continuation of Santyl to the sacral wound with saline gauze  packing with daily dressing changes -Offloading is being done already with air cushion mattress, and turning from side to side patient has independent capability of turning herself from side to side as well which helps -Patient will return to clinic next week -We will obtain x-ray of the sacrum to rule out osteo if needed Acosta CAT scan can be obtained Electronic Signature(s) Signed: 03/29/2020 11:52:05 AM By: Tobi Bastos MD, MBA Entered By: Tobi Bastos on 03/29/2020 11:52:05 -------------------------------------------------------------------------------- HxROS Details Patient Name: Date of Service: Terri Acosta, Terri Acosta. 03/29/2020 10:30 Acosta M Medical Record Number: [id number] Patient Account Number: [id number] Date of Birth/Sex: Treating RN: 1923-07-06 ([name]) Carlene Coria Primary Care Provider: MA ST, MA Acosta Other Clinician: Referring Provider: Treating Provider/Extender: Tobi Bastos MA ST, MA Acosta Weeks in Treatment: 0 Information Obtained From Patient Constitutional Symptoms (General Health) Complaints and Symptoms: Negative for: Fatigue; Fever; Chills; Marked Weight Change Eyes Complaints and Symptoms: Negative for: Dry Eyes; Vision Changes; Glasses / Contacts Medical History: Positive for: Glaucoma Negative for: Cataracts; Optic Neuritis Ear/Nose/Mouth/Throat Complaints and Symptoms: Negative for: Chronic sinus problems or rhinitis Medical History: Negative for: Chronic sinus problems/congestion; Middle ear problems Respiratory Complaints and Symptoms: Negative for: Chronic or frequent coughs; Shortness of Breath Medical History: Negative for: Aspiration; Asthma; Chronic Obstructive Pulmonary Disease (COPD); Pneumothorax; Sleep Apnea; Tuberculosis Cardiovascular Complaints and Symptoms:  Negative for: Chest pain Medical History: Positive for: Hypertension Negative for: Angina; Arrhythmia; Congestive Heart Failure; Coronary Artery Disease; Deep Vein Thrombosis;  Hypotension; Myocardial Infarction; Peripheral Arterial Disease; Peripheral Venous Disease; Phlebitis; Vasculitis Gastrointestinal Complaints and Symptoms: Negative for: Frequent diarrhea; Nausea; Vomiting Medical History: Negative for: Cirrhosis ; Colitis; Crohns; Hepatitis Acosta; Hepatitis B; Hepatitis C Endocrine Complaints and Symptoms: Negative for: Heat/cold intolerance Medical History: Negative for: Type I Diabetes; Type II Diabetes Genitourinary Complaints and Symptoms: Negative for: Frequent urination Medical History: Negative for: End Stage Renal Disease Integumentary (Skin) Complaints and Symptoms: Positive for: Wounds Medical History: Negative for: History of Burn Musculoskeletal Complaints and Symptoms: Negative for: Muscle Pain; Muscle Weakness Medical History: Negative for: Gout; Rheumatoid Arthritis; Osteoarthritis; Osteomyelitis Neurologic Complaints and Symptoms: Negative for: Numbness/parasthesias Medical History: Negative for: Dementia; Neuropathy; Quadriplegia; Paraplegia; Seizure Disorder Psychiatric Complaints and Symptoms: Negative for: Claustrophobia; Suicidal Medical History: Negative for: Anorexia/bulimia; Confinement Anxiety Hematologic/Lymphatic Medical History: Negative for: Anemia; Hemophilia; Human Immunodeficiency Virus; Lymphedema; Sickle Cell Disease Immunological Medical History: Negative for: Lupus Erythematosus; Raynauds; Scleroderma Oncologic Medical History: Negative for: Received Chemotherapy; Received Radiation HBO Extended History Items Eyes: Glaucoma Immunizations Pneumococcal Vaccine: Received Pneumococcal Vaccination: No Implantable Devices None Family and Social History Cancer: No; Diabetes: No; Heart Disease: No; Hereditary Spherocytosis: No; Hypertension: Yes - Father; Kidney Disease: No; Lung Disease: No; Seizures: No; Stroke: No; Thyroid Problems: No; Tuberculosis: Yes - Paternal Grandparents; Former smoker;  Marital Status - Widowed; Alcohol Use: Daily; Drug Use: No History; Caffeine Use: Daily; Financial Concerns: No; Food, Clothing or Shelter Needs: No; Support System Lacking: No; Transportation Concerns: No Engineer, maintenance) Signed: 03/29/2020 4:24:36 PM By: Tobi Bastos MD, MBA Signed: 03/30/2020 5:05:11 PM By: Carlene Coria RN Entered By: Carlene Coria on 03/29/2020 10:37:55 -------------------------------------------------------------------------------- SuperBill Details Patient Name: Date of Service: Terri Acosta, Terri Acosta. 03/29/2020 Medical Record Number: [id number] Patient Account Number: [id number] Date of Birth/Sex: Treating RN: Oct 15, 1922 (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

## 2020-03-31 ENCOUNTER — Encounter: Payer: Self-pay | Admitting: *Deleted

## 2020-03-31 ENCOUNTER — Non-Acute Institutional Stay (SKILLED_NURSING_FACILITY): Payer: Medicare Other | Admitting: Nurse Practitioner

## 2020-03-31 ENCOUNTER — Encounter: Payer: Self-pay | Admitting: Nurse Practitioner

## 2020-03-31 DIAGNOSIS — I83029 Varicose veins of left lower extremity with ulcer of unspecified site: Secondary | ICD-10-CM | POA: Diagnosis not present

## 2020-03-31 DIAGNOSIS — L89153 Pressure ulcer of sacral region, stage 3: Secondary | ICD-10-CM

## 2020-03-31 DIAGNOSIS — F5105 Insomnia due to other mental disorder: Secondary | ICD-10-CM

## 2020-03-31 DIAGNOSIS — I83892 Varicose veins of left lower extremities with other complications: Secondary | ICD-10-CM

## 2020-03-31 DIAGNOSIS — I4891 Unspecified atrial fibrillation: Secondary | ICD-10-CM | POA: Diagnosis not present

## 2020-03-31 DIAGNOSIS — F418 Other specified anxiety disorders: Secondary | ICD-10-CM

## 2020-03-31 DIAGNOSIS — D649 Anemia, unspecified: Secondary | ICD-10-CM

## 2020-03-31 DIAGNOSIS — R609 Edema, unspecified: Secondary | ICD-10-CM

## 2020-03-31 DIAGNOSIS — L97929 Non-pressure chronic ulcer of unspecified part of left lower leg with unspecified severity: Secondary | ICD-10-CM

## 2020-03-31 DIAGNOSIS — R600 Localized edema: Secondary | ICD-10-CM

## 2020-03-31 NOTE — Assessment & Plan Note (Signed)
Left lower leg, stasis ulcers, f/u wound care center.

## 2020-03-31 NOTE — Progress Notes (Addendum)
Location:   [facility] Room Number: 9 Place of Service:  SNF (31) Provider:  Marlana Latus, NP  Kyi Romanello X, NP  Patient Care Team: Jentry Mcqueary X, NP as PCP - General (Internal Medicine) [place] Netta Cedars, MD as Consulting Physician (Orthopedic Surgery) Luberta Mutter, MD as Consulting Physician (Ophthalmology) Sydnee Levans, MD as Consulting Physician (Dermatology) Adrian Prows, MD as Consulting Physician (Cardiology)  Extended Emergency Contact Information Primary Emergency Contact: [place] Phone: [phone] Mobile Phone: [phone] Relation: Daughter  Code Status:  DNR Goals of care: Advanced Directive information Advanced Directives 03/31/2020  Does Patient Have a Medical Advance Directive? Yes  Type of Paramedic of [place];Out of facility DNR (pink MOST or yellow form)  Does patient want to make changes to medical advance directive? No - Patient declined  Copy of [place] in Chart? Yes - validated most recent copy scanned in chart (See row information)  Would patient like information on creating a medical advance directive? -  Pre-existing out of facility DNR order (yellow form or pink MOST form) Yellow form placed in chart (order not valid for inpatient use)     Chief Complaint  Patient presents with  . Medical Management of Chronic Issues    Routine follow up visit    HPI:  Pt is a 86 y.o. female seen today for medical management of chronic diseases.    Left lower leg open wounds developed from previous skin tears, f/u wound care center.              Anemia, Hgb 8.3 03/08/20, takes Fe             Stage 3 sacral pressure ulcer, f/u at wound care cente             Afib, takes Metoprolol, Diltiazem, off Eliquis.                          BLE edema, persists, takes Torsemide qod             Mood/weight, takes Mirtazapine.                       Past Medical History:  Diagnosis Date  . Abnormal uterine bleeding   . Actinic keratosis 02/13/2012  . Acute bronchospasm 10/31/2011  . Cancer ([place]) 04/2008   Skin cancer of low back [name], MD  . Cataract   . Disorder of bone and cartilage, unspecified 02/18/2007  . Dizziness and giddiness 08/30/2010  . Dysmenorrhea   . Edema 12/19/2011  . Endometriosis   . 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 Osteoarthrosis, unspecified whether generalized or localized, unspecified site 08/20/2006  . Osteoporosis   . Other abnormal blood chemistry 03/14/2011  . Other and unspecified hyperlipidemia 10/18/2010  . Palpitations 02/13/2012  . Postmenopausal bleeding 08/30/2010  . Shortness of breath 11/28/2011  . Supraventricular premature beats 05/23/2011  . Unspecified essential hypertension 08/20/2006   Past Surgical History:  Procedure Laterality Date  . CARPAL TUNNEL RELEASE  2004   S. Norris MD  . CATARACT EXTRACTION W/ INTRAOCULAR LENS IMPLANT Right 2010  . CATARACT EXTRACTION W/ INTRAOCULAR LENS IMPLANT Left 10/2012  . DILATION AND CURETTAGE OF UTERUS    . EYE SURGERY Right 07/2009   cataract extraction/IOLI [name], MD  . KNEE ARTHROSCOPY Right 2008   Torn meniscus, Dr. Veverly Fells  Allergies  Allergen Reactions  . Lomotil [Diphenoxylate] Nausea And Vomiting  . Sulfa Antibiotics Other (See Comments)    Unknown   . Amlodipine Swelling    Allergies as of 03/31/2020      Reactions   Lomotil [diphenoxylate] Nausea And Vomiting   Sulfa Antibiotics Other (See Comments)   Unknown    Amlodipine Swelling      Medication List       Accurate as of March 31, 2020  4:22 PM. If you have any questions, ask your nurse or doctor.        diltiazem 120 MG 24 hr capsule Commonly known as: CARDIZEM CD Take 1 capsule (120 mg total) by mouth daily.   feeding supplement (PRO-STAT SUGAR FREE 64) Liqd Take 30 mLs by mouth 3 (three) times daily with meals.   ferrous sulfate  325 (65 FE) MG tablet Take 325 mg by mouth. Once a day on Sunday, Tuesday, Thursday and Saturday   loperamide 2 MG capsule Commonly known as: IMODIUM Take 1 capsule (2 mg total) by mouth every 6 (six) hours as needed for diarrhea or loose stools.   magnesium oxide 400 MG tablet Commonly known as: MAG-OX Take 400 mg by mouth daily.   metoprolol tartrate 50 MG tablet Commonly known as: LOPRESSOR Take 75 mg by mouth 2 (two) times daily.   mirtazapine 7.5 MG tablet Commonly known as: REMERON Take 7.5 mg by mouth every other day.   multivitamin-lutein Caps capsule Take 1 capsule by mouth in the morning and at bedtime.   potassium chloride SA 20 MEQ tablet Commonly known as: KLOR-CON Take 30 mEq by mouth daily.   saccharomyces boulardii 250 MG capsule Commonly known as: FLORASTOR Take 250 mg by mouth 2 (two) times daily.   torsemide 20 MG tablet Commonly known as: DEMADEX Take 20 mg by mouth every other day.   vitamin B-12 1000 MCG tablet Commonly known as: CYANOCOBALAMIN Take 1,000 mcg by mouth daily.   zinc oxide 20 % ointment Apply 1 application topically as needed for irritation.       Review of Systems  Constitutional: Negative for appetite change, fatigue and fever.  HENT: Positive for hearing loss. Negative for congestion, trouble swallowing and voice change.   Eyes: Negative for visual disturbance.  Respiratory: Positive for shortness of breath. Negative for cough, chest tightness and wheezing.        Chronic DOE, uses O2  Cardiovascular: Positive for leg swelling. Negative for chest pain and palpitations.  Gastrointestinal: Negative for abdominal pain and constipation.  Genitourinary: Negative for dysuria and urgency.  Musculoskeletal: Positive for gait problem.  Skin: Positive for wound. Negative for color change.  Neurological: Negative for dizziness, speech difficulty, weakness and headaches.  Psychiatric/Behavioral: Negative for confusion and sleep  disturbance. The patient is not nervous/anxious.     Immunization History  Administered Date(s) Administered  . Influenza Whole 08/21/2010, 05/21/2012  . Influenza, High Dose Seasonal PF 05/30/2017, 06/04/2019  . Influenza,inj,Quad PF,6+ Mos 05/23/2018  . Influenza-Unspecified 06/04/2014, 05/20/2015, 06/08/2016  . Pneumococcal Conjugate-13 08/22/1999  . Td 08/22/1999  . Zoster 08/21/2005   Pertinent  Health Maintenance Due  Topic Date Due  . DEXA SCAN  Never done  . PNA vac Low Risk Adult (2 of 2 - PPSV23) 08/21/2000  . INFLUENZA VACCINE  03/21/2020   Fall Risk  07/23/2019 01/15/2019 07/17/2018 06/04/2018 09/12/2017  Falls in the past year? 0 0 0 No No  Number falls in past yr: 0 0 0 - -  Injury with Fall? 0 0 0 - -   Functional Status Survey:    Vitals:   03/31/20 1454  BP: (!) 86/73  Pulse: (!) 105  Resp: 19  Temp: 97.9 F (36.6 C)  SpO2: 98%  Weight: 100 lb 1.6 oz (45.4 kg)  Height: '5\' 2"'  (1.575 m)   Body mass index is 18.31 kg/m. Physical Exam Vitals and nursing note reviewed.  Constitutional:      Appearance: Normal appearance.  HENT:     Head: Normocephalic and atraumatic.     Mouth/Throat:     Mouth: Mucous membranes are moist.  Eyes:     Extraocular Movements: Extraocular movements intact.     Conjunctiva/sclera: Conjunctivae normal.     Pupils: Pupils are equal, round, and reactive to light.     Comments: Crusted eyelashes, left eye low vision  Cardiovascular:     Rate and Rhythm: Tachycardia present. Rhythm irregular.     Heart sounds: No murmur heard.      Comments: Weak DP pulses. HR 100s Pulmonary:     Breath sounds: No wheezing or rales.  Abdominal:     General: Bowel sounds are normal.     Palpations: Abdomen is soft.     Tenderness: There is no abdominal tenderness.  Musculoskeletal:     Cervical back: Normal range of motion and neck supple.     Right lower leg: Edema present.     Left lower leg: Edema present.     Comments: 1+ edema  BLE, LLE>RLE  Skin:    General: Skin is warm and dry.     Comments: sacral pressure wound(stage III), left lower leg venous ulcers are covered in dressing during my examination today.    Neurological:     General: No focal deficit present.     Mental Status: She is alert and oriented to person, place, and time. Mental status is at baseline.     Gait: Gait abnormal.  Psychiatric:        Mood and Affect: Mood normal.        Behavior: Behavior normal.        Thought Content: Thought content normal.        Judgment: Judgment normal.     Labs reviewed: Recent Labs    01/24/20 0414 01/24/20 0414 01/25/20 0428 01/25/20 0428 01/26/20 0446 01/29/20 0000 02/12/20 0000 03/04/20 0000 03/08/20 0000  NA 133*   < > 133*   < > 130*   < > 141 137 137  K 3.7   < > 3.6   < > 3.6   < > 3.7 4.0 4.0  CL 103   < > 103   < > 101   < > 93* 95* 97*  CO2 22   < > 21*   < > 22   < > 41* 34* 37*  GLUCOSE 123*  --  118*  --  109*  --   --   --   --   BUN 17   < > 15   < > 15   < > 23* 26* 24*  CREATININE 0.70   < > 0.61   < > 0.66   < > 0.7 0.7 0.6  CALCIUM 8.2*   < > 8.0*   < > 7.7*   < > 8.2* 8.3* 8.5*  MG 1.6*   < > 2.1  --  1.8  --   --   --  1.6   < > =  values in this interval not displayed.   Recent Labs    07/14/19 0820 07/14/19 0820 01/15/20 0840 01/15/20 0840 01/18/20 1302 02/12/20 0000 03/08/20 0000  AST 15   < > 12   < > 14* 20 12*  ALT 8   < > 7   < > '10 20 7  ' ALKPHOS  --   --   --   --  47 47 47  BILITOT 0.5  --  0.7  --  1.0  --   --   PROT 6.1  --  5.9*  --  5.8*  --   --   ALBUMIN  --   --   --   --  3.0* 2.9* 2.7*   < > = values in this interval not displayed.   Recent Labs    01/24/20 0414 01/24/20 0414 01/25/20 0428 01/25/20 0428 01/26/20 0446 01/26/20 0446 01/29/20 0000 02/12/20 0000 03/08/20 0000  WBC 11.8*   < > 12.6*   < > 10.5  --  14.0  14.0 6.7 9.1  NEUTROABS 9.6*   < > 10.6*   < > 8.6*  --   --  5,105 7,280  HGB 11.1*   < > 9.9*   < > 10.0*   < >  12.6  12.6 10.1* 8.3*  HCT 35.7*   < > 31.4*   < > 31.6*   < > 0*  38 31* 26*  MCV 88.1  --  87.7  --  86.8  --   --   --   --   PLT 332   < > 279   < > 255   < > 328  328 188 271   < > = values in this interval not displayed.   Lab Results  Component Value Date   TSH 2.195 01/19/2020   No results found for: HGBA1C Lab Results  Component Value Date   CHOL 191 07/14/2019   HDL 65 07/14/2019   LDLCALC 110 (H) 07/14/2019   TRIG 71 07/14/2019   CHOLHDL 2.9 07/14/2019    Significant Diagnostic Results in last 30 days:  LONG TERM MONITOR (3-14 DAYS)  Result Date: 03/28/2020  100% Afib burden, with average rate 97 bpm.  3 days of data recorded on Zio monitor. Patient had a min HR of 54 bpm, max HR of 190 bpm, and avg HR of 97 bpm. 100% Afib burden, with average rate 97 bpm.  No VT, SVT,high degree block, or pauses noted. Isolated atrial and ventricular ectopy was rare (<1%). There were 0 triggered events.    Assessment/Plan Pressure ulcer Sacral pressure ulcer, f/u wound care center  Venous stasis ulcer of left lower leg with edema of left lower leg (HCC) Left lower leg, stasis ulcers, f/u wound care center.   Anemia Iron 25 03/08/20, Hgb 8.3, continue Fe, update CBC/diff  Atrial fibrillation with rapid ventricular response (HCC) Heart rate 100s, continue Metoprolol, Diltiazem, Bp rechecked 98/16mHg, the patient denied dizziness, headache, change of vision, chest pain/pressures, or palpitation, observe.   Edema BLE L>R, continue Torsemide, update CMP/eGFR  Insomnia secondary to depression with anxiety Sleeps, eats better, continue Mirtazapine.      Family/ staff Communication: plan of care reviewed with the patient and charge nurse.   Labs/tests ordered:  CBC/diff, CMP/eGFR  Time spend 25 minutes.

## 2020-03-31 NOTE — Assessment & Plan Note (Signed)
Sacral pressure ulcer, f/u wound care center

## 2020-03-31 NOTE — Assessment & Plan Note (Signed)
Heart rate 100s, continue Metoprolol, Diltiazem, Bp rechecked 98/26mmHg, the patient denied dizziness, headache, change of vision, chest pain/pressures, or palpitation, observe.

## 2020-03-31 NOTE — Assessment & Plan Note (Signed)
BLE L>R, continue Torsemide, update CMP/eGFR

## 2020-03-31 NOTE — Assessment & Plan Note (Signed)
Iron 25 03/08/20, Hgb 8.3, continue Fe, update CBC/diff

## 2020-03-31 NOTE — Assessment & Plan Note (Signed)
Sleeps, eats better, continue Mirtazapine.

## 2020-04-01 LAB — BASIC METABOLIC PANEL
BUN: 25 — AB (ref 4–21)
CO2: 34 — AB (ref 13–22)
Chloride: 99 (ref 99–108)
Creatinine: 0.8 (ref 0.5–1.1)
Glucose: 79
Potassium: 4.3 (ref 3.4–5.3)
Sodium: 140 (ref 137–147)

## 2020-04-01 LAB — COMPREHENSIVE METABOLIC PANEL
Albumin: 2.7 — AB (ref 3.5–5.0)
Calcium: 8.5 — AB (ref 8.7–10.7)
Globulin: 2.3

## 2020-04-01 LAB — HEPATIC FUNCTION PANEL
ALT: 12 (ref 7–35)
AST: 12 — AB (ref 13–35)
Alkaline Phosphatase: 54 (ref 25–125)
Bilirubin, Total: 0.4

## 2020-04-01 LAB — CBC AND DIFFERENTIAL
HCT: 27 — AB (ref 36–46)
Hemoglobin: 8.4 — AB (ref 12.0–16.0)
Neutrophils Absolute: 7719
Platelets: 296 (ref 150–399)
WBC: 9.3

## 2020-04-01 LAB — CBC: RBC: 2.99 — AB (ref 3.87–5.11)

## 2020-04-05 ENCOUNTER — Encounter (HOSPITAL_BASED_OUTPATIENT_CLINIC_OR_DEPARTMENT_OTHER): Payer: Medicare Other | Admitting: Internal Medicine

## 2020-04-05 DIAGNOSIS — L89154 Pressure ulcer of sacral region, stage 4: Secondary | ICD-10-CM | POA: Diagnosis not present

## 2020-04-05 NOTE — Progress Notes (Signed)
Terri Acosta, Terri Acosta ([id number]) Visit Report for 04/05/2020 Debridement Details Patient Name: Date of Service: [name], [name] IS A. 04/05/2020 12:30 PM Medical Record Number: [id number] Patient Account Number: [id number] Date of Birth/Sex: Treating RN: 03-03-1923 (86 y.o. [name] Primary Care Provider: MA ST, MA N Other Clinician: Referring Provider: Treating Provider/Extender: Linton Ham MA ST, MA N Weeks in Treatment: 1 Debridement Performed for Assessment: Wound #4 Sacrum Performed By: Physician Ricard Dillon., MD Debridement Type: Debridement Level of Consciousness (Pre-procedure): Awake and Alert Pre-procedure Verification/Time Out Yes - 13:50 Taken: Start Time: 13:50 T Area Debrided (L x W): otal 3.5 (cm) x 2 (cm) = 7 (cm) Tissue and other material debrided: Viable, Non-Viable, Slough, Subcutaneous, Slough Level: Skin/Subcutaneous Tissue Debridement Description: Excisional Instrument: Curette Bleeding: Minimum Hemostasis Achieved: Pressure End Time: 13:52 Procedural Pain: 0 Post Procedural Pain: 0 Response to Treatment: Procedure was tolerated well Level of Consciousness (Post- Awake and Alert procedure): Post Debridement Measurements of Total Wound Length: (cm) 3.5 Stage: Category/Stage IV Width: (cm) 2 Depth: (cm) 1.5 Volume: (cm) 8.247 Character of Wound/Ulcer Post Debridement: Requires Further Debridement Post Procedure Diagnosis Same as Pre-procedure Electronic Signature(s) Signed: 04/05/2020 5:14:32 PM By: [name] RN, BSN Signed: 04/05/2020 5:53:04 PM By: Linton Ham MD Entered By: Linton Ham on 04/05/2020 13:57:41 -------------------------------------------------------------------------------- HPI Details Patient Name: Date of Service: [name], [name] 04/05/2020 12:30 PM Medical Record Number: [id number] Patient Account Number: [id number] Date of Birth/Sex: Treating RN: 04/05/1923 (86 y.o. [name] Primary Care  Provider: MA ST, MA N Other Clinician: Referring Provider: Treating Provider/Extender: Linton Ham MA ST, MA N Weeks in Treatment: 1 History of Present Illness HPI Description: 86 year old female who was admitted in late May and discharged early June for suspected infectious diarrhea and after undergoing inpatient management with hydration, IV antibiotics, she was released to skilled nursing facility for recovery, during this time she developed a sacral wound that was classified as stage III for which they have been using Santyl and gauze packing with saline with daily changes. She also developed left leg skin tears and then shallow wounds that were addressed with silver alginate dressings. Patient originally is from assisted living and owing to her acute illness in June was transition to skilled nursing and the intention is for her to go back to assisted living once her recovery is complete. Patient denies any significant pain or discomfort in the sacrum or the leg areas. Intake and appetite have been good, denies any diarrhea or constipation. She denies any fevers or chills. Patient has atrial fibrillation with irregular heart rhythm with frequent episodes of tachycardia in the 100-120 range, she also runs borderline low blood pressures. Other medical problems includes chronic anemia latest hemoglobin 8.3 she is on iron supplements, mild anasarca with cirrhosis identified per CT scan of the abdomen, infrarenal AAA with partial clot 3.5 cm size. ABI in the clinic was difficult to obtain owing to edema in the leg, marked at 0.62, note that CT scan performed in the hospital showed significant atherosclerotic disease in the major arteries 8/16; this is a patient who is at the skilled unit of [facility] although she was in the independent up until May. She was hospitalized and developed a pressure sore on her sacrum. Apparently she also had some abrasions to her left lower leg which was  traumatic although I know none of the details. She was noted in this clinic to probably have PAD although this is not been  aggressively investigated. We did ask for a x-ray of the lower sacrum according to the patient who seems cognitively intact that has not been done Electronic Signature(s) Signed: 04/05/2020 5:53:04 PM By: Linton Ham MD Entered By: Linton Ham on 04/05/2020 13:59:57 -------------------------------------------------------------------------------- Physical Exam Details Patient Name: Date of Service: [name], [name] 04/05/2020 12:30 PM Medical Record Number: [id number] Patient Account Number: [id number] Date of Birth/Sex: Treating RN: 14-Aug-1923 (86 y.o. [name] Primary Care Provider: MA ST, MA N Other Clinician: Referring Provider: Treating Provider/Extender: Linton Ham MA ST, MA N Weeks in Treatment: 1 Respiratory work of breathing is normal. Cardiovascular Pedal pulses are not palpable in the left. Psychiatric Patient appears to be cognitively intact. Notes Wound exam; Sacrum patient has a stage IV wound on the lower sacrum no palpable bone but very little tissue over it. I used a scoop curette to remove as much necrotic material is I could easily removed. Hemostasis with direct pressure On the left lower leg there are several superficial areas which appear to have healthy wound bases. I did not think any debridement was necessary here. Electronic Signature(s) Signed: 04/05/2020 5:53:04 PM By: Linton Ham MD Entered By: Linton Ham on 04/05/2020 14:01:21 -------------------------------------------------------------------------------- Physician Orders Details Patient Name: Date of Service: [name], [name] 04/05/2020 12:30 PM Medical Record Number: [id number] Patient Account Number: [id number] Date of Birth/Sex: Treating RN: [birth year] (86 y.o. [name] Primary Care Provider: MA ST, MA N Other Clinician: Referring  Provider: Treating Provider/Extender: Linton Ham MA ST, MA N Weeks in Treatment: 1 Verbal / Phone Orders: No Diagnosis Coding ICD-10 Coding Code Description L89.153 Pressure ulcer of sacral region, stage 3 L97.221 Non-pressure chronic ulcer of left calf limited to breakdown of skin I48.11 Longstanding persistent atrial fibrillation Follow-up Appointments Return Appointment in 2 weeks. Dressing Change Frequency Change dressing every day. - all wounds Skin Barriers/Peri-Wound Care Moisturizing lotion - left leg daily Wound Cleansing Clean wound with Normal Saline. - or wound cleanser Primary Wound Dressing Wound #2 Left,Distal,Lateral Lower Leg Calcium Alginate with Silver Wound #3 Left,Posterior Lower Leg Calcium Alginate with Silver Wound #4 Sacrum Santyl Ointment - fill space with saline moistened gauze Secondary Dressing Wound #2 Left,Distal,Lateral Lower Leg Dry Gauze Wound #3 Left,Posterior Lower Leg Dry Gauze Wound #4 Sacrum Foam Border Edema Control Kerlix and Coban - Left Lower Extremity Elevate legs to the level of the heart or above for 30 minutes daily and/or when sitting, a frequency of: - throughout the day Off-Loading Low air-loss mattress (Group 2) Roho cushion for wheelchair Turn and reposition every 2 hours Electronic Signature(s) Signed: 04/05/2020 5:14:32 PM By: [name] RN, BSN Signed: 04/05/2020 5:53:04 PM By: Linton Ham MD Entered By: [name] on 04/05/2020 13:53:54 -------------------------------------------------------------------------------- Problem List Details Patient Name: Date of Service: [name], [name] 04/05/2020 12:30 PM Medical Record Number: [id number] Patient Account Number: [id number] Date of Birth/Sex: Treating RN: [birth year] (86 y.o. [name] Primary Care Provider: MA ST, MA N Other Clinician: Referring Provider: Treating Provider/Extender: Linton Ham MA ST, MA N Weeks in Treatment:  1 Active Problems ICD-10 Encounter Code Description Active Date MDM Diagnosis L97.221 Non-pressure chronic ulcer of left calf limited to breakdown of skin 03/29/2020 No Yes L89.154 Pressure ulcer of sacral region, stage 4 04/05/2020 No Yes I48.11 Longstanding persistent atrial fibrillation 03/29/2020 No Yes Inactive Problems Resolved Problems Electronic Signature(s) Signed: 04/05/2020 5:53:04 PM By: Linton Ham MD Entered By: Linton Ham on 04/05/2020 13:57:14 --------------------------------------------------------------------------------  Progress Note Details Patient Name: Date of Service: [name], [name] 04/05/2020 12:30 PM Medical Record Number: [id number] Patient Account Number: [id number] Date of Birth/Sex: Treating RN: 07-23-1923 (86 y.o. [name] Primary Care Provider: MA ST, MA N Other Clinician: Referring Provider: Treating Provider/Extender: Linton Ham MA ST, MA N Weeks in Treatment: 1 Subjective History of Present Illness (HPI) 86 year old female who was admitted in late May and discharged early June for suspected infectious diarrhea and after undergoing inpatient management with hydration, IV antibiotics, she was released to skilled nursing facility for recovery, during this time she developed a sacral wound that was classified as stage III for which they have been using Santyl and gauze packing with saline with daily changes. She also developed left leg skin tears and then shallow wounds that were addressed with silver alginate dressings. Patient originally is from assisted living and owing to her acute illness in June was transition to skilled nursing and the intention is for her to go back to assisted living once her recovery is complete. Patient denies any significant pain or discomfort in the sacrum or the leg areas. Intake and appetite have been good, denies any diarrhea or constipation. She denies any fevers or chills. Patient has atrial  fibrillation with irregular heart rhythm with frequent episodes of tachycardia in the 100-120 range, she also runs borderline low blood pressures. Other medical problems includes chronic anemia latest hemoglobin 8.3 she is on iron supplements, mild anasarca with cirrhosis identified per CT scan of the abdomen, infrarenal AAA with partial clot 3.5 cm size. ABI in the clinic was difficult to obtain owing to edema in the leg, marked at 0.62, note that CT scan performed in the hospital showed significant atherosclerotic disease in the major arteries 8/16; this is a patient who is at the skilled unit of [facility] although she was in the independent up until May. She was hospitalized and developed a pressure sore on her sacrum. Apparently she also had some abrasions to her left lower leg which was traumatic although I know none of the details. She was noted in this clinic to probably have PAD although this is not been aggressively investigated. We did ask for a x-ray of the lower sacrum according to the patient who seems cognitively intact that has not been done Objective Constitutional Vitals Time Taken: 1:24 PM, Height: 64 in, Weight: 90 lbs, BMI: 15.4, Temperature: 98.2 F, Pulse: 72 bpm, Respiratory Rate: 18 breaths/min, Blood Pressure: 106/69 mmHg. Respiratory work of breathing is normal. Cardiovascular Pedal pulses are not palpable in the left. Psychiatric Patient appears to be cognitively intact. General Notes: Wound exam; ooSacrum patient has a stage IV wound on the lower sacrum no palpable bone but very little tissue over it. I used a scoop curette to remove as much necrotic material is I could easily removed. Hemostasis with direct pressure ooOn the left lower leg there are several superficial areas which appear to have healthy wound bases. I did not think any debridement was necessary here. Integumentary (Hair, Skin) Wound #1 status is Open. Original cause of wound was Trauma.  The wound is located on the Left,Lateral Lower Leg. The wound measures 0cm length x 0cm width x 0cm depth; 0cm^2 area and 0cm^3 volume. There is no tunneling or undermining noted. There is a none present amount of drainage noted. There is no granulation within the wound bed. There is no necrotic tissue within the wound bed. Wound #2 status is Open. Original  cause of wound was Trauma. The wound is located on the Left,Distal,Lateral Lower Leg. The wound measures 0.5cm length x 0.4cm width x 0.1cm depth; 0.157cm^2 area and 0.016cm^3 volume. There is Fat Layer (Subcutaneous Tissue) Exposed exposed. There is no tunneling or undermining noted. There is a medium amount of serosanguineous drainage noted. There is medium (34-66%) red granulation within the wound bed. There is a medium (34-66%) amount of necrotic tissue within the wound bed including Adherent Slough. Wound #3 status is Open. Original cause of wound was Gradually Appeared. The wound is located on the Left,Posterior Lower Leg. The wound measures 6cm length x 2.3cm width x 0.1cm depth; 10.838cm^2 area and 1.084cm^3 volume. There is Fat Layer (Subcutaneous Tissue) Exposed exposed. There is no tunneling or undermining noted. There is a medium amount of serosanguineous drainage noted. There is medium (34-66%) red granulation within the wound bed. There is a medium (34-66%) amount of necrotic tissue within the wound bed including Adherent Slough. Wound #4 status is Open. Original cause of wound was Gradually Appeared. The wound is located on the Sacrum. The wound measures 3.5cm length x 2cm width x 1.5cm depth; 5.498cm^2 area and 8.247cm^3 volume. There is bone and Fat Layer (Subcutaneous Tissue) Exposed exposed. There is no tunneling or undermining noted. There is a medium amount of serosanguineous drainage noted. There is medium (34-66%) pink granulation within the wound bed. There is a medium (34-66%) amount of necrotic tissue within the wound bed  including Adherent Slough. 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 There was a Excisional Skin/Subcutaneous Tissue Debridement with a total area of 7 sq cm performed by Ricard Dillon., MD. With the following instrument(s): Curette to remove Viable and Non-Viable tissue/material. Material removed includes Subcutaneous Tissue and Slough and. No specimens were taken. A time out was conducted at 13:50, prior to the start of the procedure. A Minimum amount of bleeding was controlled with Pressure. The procedure was tolerated well with a pain level of 0 throughout and a pain level of 0 following the procedure. Post Debridement Measurements: 3.5cm length x 2cm width x 1.5cm depth; 8.247cm^3 volume. Post debridement Stage noted as Category/Stage IV. Character of Wound/Ulcer Post Debridement requires further debridement. Post procedure Diagnosis Wound #4: Same as Pre-Procedure Plan Follow-up Appointments: Return Appointment in 2 weeks. Dressing Change Frequency: Change dressing every day. - all wounds Skin Barriers/Peri-Wound Care: Moisturizing lotion - left leg daily Wound Cleansing: Clean wound with Normal Saline. - or wound cleanser Primary Wound Dressing: Wound #2 Left,Distal,Lateral Lower Leg: Calcium Alginate with Silver Wound #3 Left,Posterior Lower Leg: Calcium Alginate with Silver Wound #4 Sacrum: Santyl Ointment - fill space with saline moistened gauze Secondary Dressing: Wound #2 Left,Distal,Lateral Lower Leg: Dry Gauze Wound #3 Left,Posterior Lower Leg: Dry Gauze Wound #4 Sacrum: Foam Border Edema Control: Kerlix and Coban - Left Lower Extremity Elevate legs to the level of the heart or above for 30 minutes daily and/or when  sitting, a frequency of: - throughout the day Off-Loading: Low air-loss mattress (Group 2) Roho cushion for wheelchair Turn and reposition every 2 hours #1 I am continuing with Santyl moist gauze and border foam to the sacrum at some point will change to silver collagen once the wound tissue looks satisfactory. 2. The issue of the sacrum is ongoing  debridement, offloading and adequate nutrition all of which I have talked to with the patient today 3. The abrasion wounds on the left lower leg do not look too ominous. I continued silver alginate with kerlix Coban although we may need to change to something to add a little more moisture. Although the patient probably has PAD I am hopeful to heal these wounds without more aggressive arterial studies Electronic Signature(s) Signed: 04/05/2020 5:53:04 PM By: Linton Ham MD Entered By: Linton Ham on 04/05/2020 14:02:58 -------------------------------------------------------------------------------- SuperBill Details Patient Name: Date of Service: [name], [name] 04/05/2020 Medical Record Number: [id number] Patient Account Number: [id number] Date of Birth/Sex: Treating RN: 1923/04/27 (86 y.o. [name] Primary Care Provider: MA ST, MA N Other Clinician: Referring Provider: Treating Provider/Extender: Linton Ham MA ST, MA N Weeks in Treatment: 1 Diagnosis Coding ICD-10 Codes Code Description [phone] Non-pressure chronic ulcer of left calf limited to breakdown of skin L89.154 Pressure ulcer of sacral region, stage 4 I48.11 Longstanding persistent atrial fibrillation Facility Procedures CPT4 Code: [id number] Description: 41740 - DEB SUBQ TISSUE 20 SQ CM/< ICD-10 Diagnosis Description L89.154 Pressure ulcer of sacral region, stage 4 L97.221 Non-pressure chronic ulcer of left calf limited to breakdown of skin Modifier: Quantity: 1 Physician Procedures : CPT4 Code Description Modifier [id number] - WC PHYS SUBQ TISS 20  SQ CM ICD-10 Diagnosis Description L89.154 Pressure ulcer of sacral region, stage 4 L97.221 Non-pressure chronic ulcer of left calf limited to breakdown of skin Quantity: 1 Electronic Signature(s) Signed: 04/05/2020 5:53:04 PM By: Linton Ham MD Entered By: Linton Ham on 04/05/2020 14:03:25

## 2020-04-17 NOTE — Progress Notes (Signed)
Terri Acosta, Terri Acosta ([id number]) Visit Report for 04/05/2020 Arrival Information Details Patient Name: Date of Service: [name], [name] IS A. 04/05/2020 12:30 PM Medical Record Number: [id number] Patient Account Number: [id number] Date of Birth/Sex: Treating RN: 09-17-1922 (86 y.o. F) [name], Terri Acosta Primary Care Rumaldo Difatta: MA ST, MA N Other Clinician: Referring Gabreil Yonkers: Treating Eboney Claybrook/Extender: Linton Ham MA ST, MA N Weeks in Treatment: 1 Visit Information History Since Last Visit All ordered tests and consults were completed: No Patient Arrived: Wheel Chair Added or deleted any medications: No Arrival Time: 13:24 Any new allergies or adverse reactions: No Accompanied By: caregiver Had a fall or experienced change in No Transfer Assistance: Manual activities of daily living that may affect Patient Identification Verified: Yes risk of falls: Secondary Verification Process Completed: Yes Signs or symptoms of abuse/neglect since last visito No Patient Requires Transmission-Based Precautions: No Hospitalized since last visit: No Patient Has Alerts: No Implantable device outside of the clinic excluding No cellular tissue based products placed in the center since last visit: Has Dressing in Place as Prescribed: Yes Has Compression in Place as Prescribed: Yes Pain Present Now: No Electronic Signature(s) Signed: 04/16/2020 5:50:16 PM By: Carlene Coria RN Entered By: Carlene Coria on 04/05/2020 13:24:45 -------------------------------------------------------------------------------- Encounter Discharge Information Details Patient Name: Date of Service: [name], [name] 04/05/2020 12:30 PM Medical Record Number: [id number] Patient Account Number: [id number] Date of Birth/Sex: Treating RN: 05/06/1923 (86 y.o. Elam Dutch Primary Care Henleigh Robello: MA ST, MA N Other Clinician: Referring Desirai Traxler: Treating Birdie Fetty/Extender: Linton Ham MA ST, MA N Weeks in Treatment: 1 Encounter  Discharge Information Items Post Procedure Vitals Discharge Condition: Stable Temperature (F): 98.2 Ambulatory Status: Wheelchair Pulse (bpm): 72 Discharge Destination: [place] Respiratory Rate (breaths/min): 18 Telephoned: No Blood Pressure (mmHg): 106/69 Orders Sent: Yes Transportation: Other Accompanied By: facility staff Schedule Follow-up Appointment: Yes Clinical Summary of Care: Patient Declined Notes facility transportation Electronic Signature(s) Signed: 04/06/2020 5:25:51 PM By: Baruch Gouty RN, BSN Entered By: Baruch Gouty on 04/05/2020 14:16:37 -------------------------------------------------------------------------------- Lower Extremity Assessment Details Patient Name: Date of Service: [name], [name] 04/05/2020 12:30 PM Medical Record Number: [id number] Patient Account Number: [id number] Date of Birth/Sex: Treating RN: [birth year] (86 y.o. Terri Acosta Primary Care Arlicia Paquette: MA ST, MA N Other Clinician: Referring Katie Moch: Treating Marzell Isakson/Extender: Linton Ham MA ST, MA N Weeks in Treatment: 1 Edema Assessment Assessed: [Left: No] [Right: No] Edema: [Left: Ye] [Right: s] Calf Left: Right: Point of Measurement: 38 cm From Medial Instep 28 cm cm Ankle Left: Right: Point of Measurement: 11 cm From Medial Instep 20 cm cm Electronic Signature(s) Signed: 04/16/2020 5:50:16 PM By: Carlene Coria RN Entered By: Carlene Coria on 04/05/2020 13:25:31 -------------------------------------------------------------------------------- Multi Wound Chart Details Patient Name: Date of Service: HA NSO Delane Ginger, [name] 04/05/2020 12:30 PM Medical Record Number: [id number] Patient Account Number: [id number] Date of Birth/Sex: Treating RN: 06/09/1923 (86 y.o. Terri Acosta Primary Care Courtenay Creger: MA ST, MA N Other Clinician: Referring Skyylar Kopf: Treating Jeanene Mena/Extender: Linton Ham MA ST, MA N Weeks in Treatment: 1 Vital  Signs Height(in): 64 Pulse(bpm): 72 Weight(lbs): 90 Blood Pressure(mmHg): 106/69 Body Mass Index(BMI): 15 Temperature(F): 98.2 Respiratory Rate(breaths/min): 18 Photos: [1:No Photos Left, Lateral Lower Leg] [2:No Photos Left, Distal, Lateral Lower Leg] [3:No Photos Left, Posterior Lower Leg] Wound Location: [1:Trauma] [2:Trauma] [3:Gradually Appeared] Wounding Event: [1:Skin Tear] [2:Skin Tear] [3:Skin Tear] Primary Etiology: [1:Glaucoma, Hypertension] [2:Glaucoma, Hypertension] [3:Glaucoma, Hypertension] Comorbid History: [1:03/16/2020] [2:03/16/2020] [3:03/16/2020] Date Acquired: [1:1] [2:1] [3:1] Weeks  of Treatment: [1:Open] [2:Open] [3:Open] Wound Status: [1:0x0x0] [2:0.5x0.4x0.1] [3:6x2.3x0.1] Measurements L x W x D (cm) [1:0] [2:0.157] [3:10.838] A (cm) : rea [1:0] [2:4.825] [3:1.084] Volume (cm) : [1:100.00%] [2:0.00%] [3:-2.20%] % Reduction in A rea: [1:100.00%] [2:0.00%] [3:-2.30%] % Reduction in Volume: [1:Full Thickness Without Exposed] [2:Full Thickness Without Exposed] [3:Full Thickness Without Exposed] Classification: [1:Support Structures None Present] [2:Support Structures Medium] [3:Support Structures Medium] Exudate A mount: [1:N/A] [2:Serosanguineous] [3:Serosanguineous] Exudate Type: [1:N/A] [2:red, brown] [3:red, brown] Exudate Color: [1:None Present (0%)] [2:Medium (34-66%)] [3:Medium (34-66%)] Granulation A mount: [1:N/A] [2:Red] [3:Red] Granulation Quality: [1:None Present (0%)] [2:Medium (34-66%)] [3:Medium (34-66%)] Necrotic A mount: [1:Fascia: No] [2:Fat Layer (Subcutaneous Tissue): Yes Fat Layer (Subcutaneous Tissue): Yes] Exposed Structures: [1:Fat Layer (Subcutaneous Tissue): No Tendon: No Muscle: No Joint: No Bone: No Large (67-100%)] [2:Fascia: No Tendon: No Muscle: No Joint: No Bone: No Medium (34-66%)] [3:Fascia: No Tendon: No Muscle: No Joint: No Bone: No None] Epithelialization: [1:N/A] [2:N/A] [3:N/A] Debridement: [1:N/A] [2:N/A] [3:N/A] Tissue  Debrided: [1:N/A] [2:N/A] [3:N/A] Level: [1:N/A] [2:N/A] [3:N/A] Debridement A (sq cm): [1:rea N/A] [2:N/A] [3:N/A] Instrument: [1:N/A] [2:N/A] [3:N/A] Bleeding: [1:N/A] [2:N/A] [3:N/A] Hemostasis A chieved: [1:N/A] [2:N/A] [3:N/A] Procedural Pain: [1:N/A] [2:N/A] [3:N/A] Post Procedural Pain: Debridement Treatment Response: N/A [2:N/A] [3:N/A] Post Debridement Measurements L x N/A [2:N/A] [3:N/A] W x D (cm) [1:N/A] [2:N/A] [3:N/A] Post Debridement Volume: (cm) [1:N/A] [2:N/A] [3:N/A] Post Debridement Stage: [1:N/A] [2:N/A] [3:N/A] Wound Number: 4 N/A N/A Photos: No Photos N/A N/A Sacrum N/A N/A Wound Location: Gradually Appeared N/A N/A Wounding Event: Pressure Ulcer N/A N/A Primary Etiology: Glaucoma, Hypertension N/A N/A Comorbid History: 03/29/2020 N/A N/A Date Acquired: 1 N/A N/A Weeks of Treatment: Open N/A N/A Wound Status: 3.5x2x1.5 N/A N/A Measurements L x W x D (cm) 5.498 N/A N/A A (cm) : rea 8.247 N/A N/A Volume (cm) : 6.70% N/A N/A % Reduction in A rea: 22.20% N/A N/A % Reduction in Volume: Category/Stage IV N/A N/A Classification: Medium N/A N/A Exudate A mount: Serosanguineous N/A N/A Exudate Type: red, brown N/A N/A Exudate Color: Medium (34-66%) N/A N/A Granulation A mount: Pink N/A N/A Granulation Quality: Medium (34-66%) N/A N/A Necrotic A mount: Fat Layer (Subcutaneous Tissue): Yes N/A N/A Exposed Structures: Bone: Yes Fascia: No Tendon: No Muscle: No Joint: No None N/A N/A Epithelialization: Debridement - Excisional N/A N/A Debridement: Pre-procedure Verification/Time Out 13:50 N/A N/A Taken: Subcutaneous, Slough N/A N/A Tissue Debrided: Skin/Subcutaneous Tissue N/A N/A Level: 7 N/A N/A Debridement A (sq cm): rea Curette N/A N/A Instrument: Minimum N/A N/A Bleeding: Pressure N/A N/A Hemostasis A chieved: 0 N/A N/A Procedural Pain: 0 N/A N/A Post Procedural Pain: Procedure was tolerated well N/A N/A Debridement  Treatment Response: 3.5x2x1.5 N/A N/A Post Debridement Measurements L x W x D (cm) 8.247 N/A N/A Post Debridement Volume: (cm) Category/Stage IV N/A N/A Post Debridement Stage: Debridement N/A N/A Procedures Performed: Treatment Notes Electronic Signature(s) Signed: 04/05/2020 5:14:32 PM By: [name] RN, BSN Signed: 04/05/2020 5:53:04 PM By: Linton Ham MD Signed: 04/05/2020 5:53:04 PM By: Linton Ham MD Entered By: Linton Ham on 04/05/2020 13:57:29 -------------------------------------------------------------------------------- Multi-Disciplinary Care Plan Details Patient Name: Date of Service: [name], [name] 04/05/2020 12:30 PM Medical Record Number: [id number] Patient Account Number: [id number] Date of Birth/Sex: Treating RN: 03/07/1923 (86 y.o. Terri Acosta Primary Care Zanyiah Posten: MA ST, MA N Other Clinician: Referring Cymone Yeske: Treating Marquette Blodgett/Extender: Linton Ham MA ST, MA N Weeks in Treatment: 1 Active Inactive Abuse / Safety / Falls / Self Care Management Nursing  Diagnoses: Potential for falls Potential for injury related to falls Goals: Patient will not experience any injury related to falls Date Initiated: 03/29/2020 Target Resolution Date: 04/30/2020 Goal Status: Active Patient/caregiver will verbalize/demonstrate measures taken to prevent injury and/or falls Date Initiated: 03/29/2020 Target Resolution Date: 04/30/2020 Goal Status: Active Interventions: Assess Activities of Daily Living upon admission and as needed Assess fall risk on admission and as needed Assess: immobility, friction, shearing, incontinence upon admission and as needed Assess impairment of mobility on admission and as needed per policy Assess personal safety and home safety (as indicated) on admission and as needed Assess self care needs on admission and as needed Provide education on fall prevention Provide education on personal and home  safety Notes: Pressure Nursing Diagnoses: Knowledge deficit related to causes and risk factors for pressure ulcer development Knowledge deficit related to management of pressures ulcers Potential for impaired tissue integrity related to pressure, friction, moisture, and shear Goals: Patient/caregiver will verbalize risk factors for pressure ulcer development Date Initiated: 03/29/2020 Target Resolution Date: 04/30/2020 Goal Status: Active Patient/caregiver will verbalize understanding of pressure ulcer management Date Initiated: 03/29/2020 Target Resolution Date: 04/30/2020 Goal Status: Active Interventions: Assess: immobility, friction, shearing, incontinence upon admission and as needed Assess offloading mechanisms upon admission and as needed Assess potential for pressure ulcer upon admission and as needed Provide education on pressure ulcers Notes: Wound/Skin Impairment Nursing Diagnoses: Impaired tissue integrity Knowledge deficit related to ulceration/compromised skin integrity Goals: Patient/caregiver will verbalize understanding of skin care regimen Date Initiated: 03/29/2020 Target Resolution Date: 04/30/2020 Goal Status: Active Interventions: Assess patient/caregiver ability to obtain necessary supplies Assess patient/caregiver ability to perform ulcer/skin care regimen upon admission and as needed Assess ulceration(s) every visit Provide education on ulcer and skin care Notes: Electronic Signature(s) Signed: 04/05/2020 5:14:32 PM By: [name] RN, BSN Entered By: [name] on 04/05/2020 13:54:24 -------------------------------------------------------------------------------- Pain Assessment Details Patient Name: Date of Service: [name], [name] 04/05/2020 12:30 PM Medical Record Number: [id number] Patient Account Number: [id number] Date of Birth/Sex: Treating RN: 02-25-1923 (86 y.o. Terri Acosta Primary Care Halton Neas: MA ST, MA N Other  Clinician: Referring Kellianne Ek: Treating Jodeci Rini/Extender: Linton Ham MA ST, MA N Weeks in Treatment: 1 Active Problems Location of Pain Severity and Description of Pain Patient Has Paino No Site Locations Pain Management and Medication Current Pain Management: Electronic Signature(s) Signed: 04/16/2020 5:50:16 PM By: Carlene Coria RN Entered By: Carlene Coria on 04/05/2020 13:25:24 -------------------------------------------------------------------------------- Patient/Caregiver Education Details Patient Name: Date of Service: HA NSO Delane Ginger, [name] 8/16/2021andnbsp12:30 PM Medical Record Number: [id number] Patient Account Number: [id number] Date of Birth/Gender: Treating RN: [birth year] (86 y.o. Terri Acosta Primary Care Physician: MA ST, MA N Other Clinician: Referring Physician: Treating Physician/Extender: Linton Ham MA ST, MA N Weeks in Treatment: 1 Education Assessment Education Provided To: Patient Education Topics Provided Pressure: Methods: Explain/Verbal Responses: State content correctly Safety: Methods: Explain/Verbal Responses: State content correctly Wound/Skin Impairment: Methods: Explain/Verbal Responses: State content correctly Electronic Signature(s) Signed: 04/05/2020 5:14:32 PM By: [name] RN, BSN Entered By: [name] on 04/05/2020 13:55:14 -------------------------------------------------------------------------------- Wound Assessment Details Patient Name: Date of Service: [name], [name] 04/05/2020 12:30 PM Medical Record Number: [id number] Patient Account Number: [id number] Date of Birth/Sex: Treating RN: 1923-01-09 (86 y.o. Terri Acosta Primary Care Nicholus Chandran: MA ST, MA N Other Clinician: Referring Adlai Nieblas: Treating Jodye Scali/Extender: Linton Ham MA ST, MA N Weeks in Treatment: 1 Wound Status Wound Number: 1 Primary Etiology: Skin Tear Wound Location: Left, Lateral  Lower Leg Wound Status: Open Wounding  Event: Trauma Comorbid History: Glaucoma, Hypertension Date Acquired: 03/16/2020 Weeks Of Treatment: 1 Clustered Wound: No Photos Photo Uploaded By: Mikeal Hawthorne on 04/06/2020 15:54:16 Wound Measurements Length: (cm) Width: (cm) Depth: (cm) Area: (cm) Volume: (cm) 0 % Reduction in Area: 100% 0 % Reduction in Volume: 100% 0 Epithelialization: Large (67-100%) 0 Tunneling: No 0 Undermining: No Wound Description Classification: Full Thickness Without Exposed Support Structures Exudate Amount: None Present Foul Odor After Cleansing: No Slough/Fibrino No Wound Bed Granulation Amount: None Present (0%) Exposed Structure Necrotic Amount: None Present (0%) Fascia Exposed: No Fat Layer (Subcutaneous Tissue) Exposed: No Tendon Exposed: No Muscle Exposed: No Joint Exposed: No Bone Exposed: No Electronic Signature(s) Signed: 04/16/2020 5:50:16 PM By: Carlene Coria RN Entered By: Carlene Coria on 04/05/2020 13:26:34 -------------------------------------------------------------------------------- Wound Assessment Details Patient Name: Date of Service: [name], [name] 04/05/2020 12:30 PM Medical Record Number: [id number] Patient Account Number: [id number] Date of Birth/Sex: Treating RN: 09/14/1922 (86 y.o. Terri Acosta, Terri Acosta Primary Care Hollynn Garno: MA ST, MA N Other Clinician: Referring Shakhia Gramajo: Treating Stony Stegmann/Extender: Linton Ham MA ST, MA N Weeks in Treatment: 1 Wound Status Wound Number: 2 Primary Etiology: Skin Tear Wound Location: Left, Distal, Lateral Lower Leg Wound Status: Open Wounding Event: Trauma Comorbid History: Glaucoma, Hypertension Date Acquired: 03/16/2020 Weeks Of Treatment: 1 Clustered Wound: No Photos Photo Uploaded By: Mikeal Hawthorne on 04/06/2020 15:54:17 Wound Measurements Length: (cm) 0.5 Width: (cm) 0.4 Depth: (cm) 0.1 Area: (cm) 0.157 Volume: (cm) 0.016 % Reduction in Area: 0% % Reduction in Volume: 0% Epithelialization: Medium  (34-66%) Tunneling: No Undermining: No Wound Description Classification: Full Thickness Without Exposed Support Structures Exudate Amount: Medium Exudate Type: Serosanguineous Exudate Color: red, brown Foul Odor After Cleansing: No Slough/Fibrino Yes Wound Bed Granulation Amount: Medium (34-66%) Exposed Structure Granulation Quality: Red Fascia Exposed: No Necrotic Amount: Medium (34-66%) Fat Layer (Subcutaneous Tissue) Exposed: Yes Necrotic Quality: Adherent Slough Tendon Exposed: No Muscle Exposed: No Joint Exposed: No Bone Exposed: No Treatment Notes Wound #2 (Left, Distal, Lateral Lower Leg) 2. Periwound Care Moisturizing lotion 3. Primary Dressing Applied Calcium Alginate Ag 4. Secondary Dressing Dry Gauze 6. Support Layer Holiday representative) Signed: 04/16/2020 5:50:16 PM By: Carlene Coria RN Entered By: Carlene Coria on 04/05/2020 13:26:55 -------------------------------------------------------------------------------- Wound Assessment Details Patient Name: Date of Service: [name], [name] 04/05/2020 12:30 PM Medical Record Number: [id number] Patient Account Number: [id number] Date of Birth/Sex: Treating RN: [birth year] (86 y.o. Terri Acosta, Terri Acosta Primary Care Morganne Haile: MA ST, MA N Other Clinician: Referring Georgette Helmer: Treating Kaleb Linquist/Extender: Linton Ham MA ST, MA N Weeks in Treatment: 1 Wound Status Wound Number: 3 Primary Etiology: Skin Tear Wound Location: Left, Posterior Lower Leg Wound Status: Open Wounding Event: Gradually Appeared Comorbid History: Glaucoma, Hypertension Date Acquired: 03/16/2020 Weeks Of Treatment: 1 Clustered Wound: No Photos Photo Uploaded By: Mikeal Hawthorne on 04/06/2020 15:56:04 Wound Measurements Length: (cm) 6 Width: (cm) 2.3 Depth: (cm) 0.1 Area: (cm) 10.838 Volume: (cm) 1.084 % Reduction in Area: -2.2% % Reduction in Volume: -2.3% Epithelialization: None Tunneling: No Undermining:  No Wound Description Classification: Full Thickness Without Exposed Support Structures Exudate Amount: Medium Exudate Type: Serosanguineous Exudate Color: red, brown Foul Odor After Cleansing: No Slough/Fibrino Yes Wound Bed Granulation Amount: Medium (34-66%) Exposed Structure Granulation Quality: Red Fascia Exposed: No Necrotic Amount: Medium (34-66%) Fat Layer (Subcutaneous Tissue) Exposed: Yes Necrotic Quality: Adherent Slough Tendon Exposed: No Muscle Exposed: No Joint Exposed: No Bone Exposed: No Treatment Notes Wound #  3 (Left, Posterior Lower Leg) 2. Periwound Care Moisturizing lotion 3. Primary Dressing Applied Calcium Alginate Ag 4. Secondary Dressing Dry Gauze 6. Support Layer Holiday representative) Signed: 04/16/2020 5:50:16 PM By: Carlene Coria RN Entered By: Carlene Coria on 04/05/2020 13:27:06 -------------------------------------------------------------------------------- Wound Assessment Details Patient Name: Date of Service: [name], [name] 04/05/2020 12:30 PM Medical Record Number: [id number] Patient Account Number: [id number] Date of Birth/Sex: Treating RN: 29-Nov-1922 (86 y.o. Terri Acosta, Terri Acosta Primary Care Reo Portela: MA ST, MA N Other Clinician: Referring Lamond Glantz: Treating Cedrik Heindl/Extender: Linton Ham MA ST, MA N Weeks in Treatment: 1 Wound Status Wound Number: 4 Primary Etiology: Pressure Ulcer Wound Location: Sacrum Wound Status: Open Wounding Event: Gradually Appeared Comorbid History: Glaucoma, Hypertension Date Acquired: 03/29/2020 Weeks Of Treatment: 1 Clustered Wound: No Photos Photo Uploaded By: Mikeal Hawthorne on 04/06/2020 15:56:06 Wound Measurements Length: (cm) 3.5 Width: (cm) 2 Depth: (cm) 1.5 Area: (cm) 5.498 Volume: (cm) 8.247 % Reduction in Area: 6.7% % Reduction in Volume: 22.2% Epithelialization: None Tunneling: No Undermining: No Wound Description Classification: Category/Stage  IV Exudate Amount: Medium Exudate Type: Serosanguineous Exudate Color: red, brown Foul Odor After Cleansing: No Slough/Fibrino Yes Wound Bed Granulation Amount: Medium (34-66%) Exposed Structure Granulation Quality: Pink Fascia Exposed: No Necrotic Amount: Medium (34-66%) Fat Layer (Subcutaneous Tissue) Exposed: Yes Necrotic Quality: Adherent Slough Tendon Exposed: No Muscle Exposed: No Joint Exposed: No Bone Exposed: Yes Treatment Notes Wound #4 (Sacrum) 3. Primary Dressing Applied Santyl Other primary dressing (specifiy in notes) 4. Secondary Dressing Foam Border Dressing Notes saline moistened gauze packing Electronic Signature(s) Signed: 04/16/2020 5:50:16 PM By: Carlene Coria RN Entered By: Carlene Coria on 04/05/2020 13:27:20 -------------------------------------------------------------------------------- Vitals Details Patient Name: Date of Service: [name], [name] 04/05/2020 12:30 PM Medical Record Number: [id number] Patient Account Number: [id number] Date of Birth/Sex: Treating RN: Aug 19, 1923 (86 y.o. Terri Acosta, [place] Primary Care Vestal Crandall: MA ST, MA N Other Clinician: Referring Miasha Emmons: Treating Tedford Berg/Extender: Linton Ham MA ST, MA N Weeks in Treatment: 1 Vital Signs Time Taken: 13:24 Temperature (F): 98.2 Height (in): 64 Pulse (bpm): 72 Weight (lbs): 90 Respiratory Rate (breaths/min): 18 Body Mass Index (BMI): 15.4 Blood Pressure (mmHg): 106/69 Reference Range: 80 - 120 mg / dl Electronic Signature(s) Signed: 04/16/2020 5:50:16 PM By: Carlene Coria RN Entered By: Carlene Coria on 04/05/2020 13:25:14

## 2020-04-19 ENCOUNTER — Encounter (HOSPITAL_BASED_OUTPATIENT_CLINIC_OR_DEPARTMENT_OTHER): Payer: Medicare Other | Admitting: Internal Medicine

## 2020-04-19 DIAGNOSIS — L89154 Pressure ulcer of sacral region, stage 4: Secondary | ICD-10-CM | POA: Diagnosis not present

## 2020-04-19 NOTE — Progress Notes (Signed)
Terri Acosta, Terri Acosta ([id number]) Visit Report for 04/19/2020 Debridement Details Patient Name: Date of Service: [name], [name] IS A. 04/19/2020 10:45 A M Medical Record Number: [id number] Patient Account Number: [id number] Date of Birth/Sex: Treating RN: March 01, 1923 (86 y.o. Terri Acosta Primary Care Provider: MA ST, MA N Other Clinician: Referring Provider: Treating Provider/Extender: Linton Ham MA ST, MA N Weeks in Treatment: 3 Debridement Performed for Assessment: Wound #4 Sacrum Performed By: Physician Ricard Dillon., MD Debridement Type: Debridement Level of Consciousness (Pre-procedure): Awake and Alert Pre-procedure Verification/Time Out Yes - 11:18 Taken: Start Time: 11:18 T Area Debrided (L x W): otal 1 (cm) x 1 (cm) = 1 (cm) Tissue and other material debrided: Non-Viable, Subcutaneous Level: Skin/Subcutaneous Tissue Debridement Description: Excisional Instrument: Blade, Forceps Bleeding: Minimum Hemostasis Achieved: Pressure End Time: 11:19 Procedural Pain: 0 Post Procedural Pain: 0 Response to Treatment: Procedure was tolerated well Level of Consciousness (Post- Awake and Alert procedure): Post Debridement Measurements of Total Wound Length: (cm) 2.5 Stage: Category/Stage IV Width: (cm) 2 Depth: (cm) 1.5 Volume: (cm) 5.89 Character of Wound/Ulcer Post Debridement: Improved Post Procedure Diagnosis Same as Pre-procedure Electronic Signature(s) Signed: 04/19/2020 4:22:50 PM By: Linton Ham MD Signed: 04/19/2020 4:24:12 PM By: [name] RN, BSN Entered By: Linton Ham on 04/19/2020 11:29:35 -------------------------------------------------------------------------------- HPI Details Patient Name: Date of Service: HA NSO Terri Acosta, [name] 04/19/2020 10:45 A M Medical Record Number: [id number] Patient Account Number: [id number] Date of Birth/Sex: Treating RN: 03/21/1923 (86 y.o. Terri Acosta Primary Care Provider: MA ST, MA N Other  Clinician: Referring Provider: Treating Provider/Extender: Linton Ham MA ST, MA N Weeks in Treatment: 3 History of Present Illness HPI Description: 86 year old female who was admitted in late May and discharged early June for suspected infectious diarrhea and after undergoing inpatient management with hydration, IV antibiotics, she was released to skilled nursing facility for recovery, during this time she developed a sacral wound that was classified as stage III for which they have been using Santyl and gauze packing with saline with daily changes. She also developed left leg skin tears and then shallow wounds that were addressed with silver alginate dressings. Patient originally is from assisted living and owing to her acute illness in June was transition to skilled nursing and the intention is for her to go back to assisted living once her recovery is complete. Patient denies any significant pain or discomfort in the sacrum or the leg areas. Intake and appetite have been good, denies any diarrhea or constipation. She denies any fevers or chills. Patient has atrial fibrillation with irregular heart rhythm with frequent episodes of tachycardia in the 100-120 range, she also runs borderline low blood pressures. Other medical problems includes chronic anemia latest hemoglobin 8.3 she is on iron supplements, mild anasarca with cirrhosis identified per CT scan of the abdomen, infrarenal AAA with partial clot 3.5 cm size. ABI in the clinic was difficult to obtain owing to edema in the leg, marked at 0.62, note that CT scan performed in the hospital showed significant atherosclerotic disease in the major arteries 8/16; this is a patient who is at the skilled unit of [facility] although she was in the independent up until May. She was hospitalized and developed a pressure sore on her sacrum. Apparently she also had some abrasions to her left lower leg which was traumatic although I know  none of the details. She was noted in this clinic to probably have PAD although this is not been aggressively investigated.  We did ask for a x-ray of the lower sacrum according to the patient who seems cognitively intact that has not been done 8/30; 2-week follow-up. [facility] [place]. Pressure sore on her sacrum not much change today although the surface looks somewhat better with the Santyl. We are not doing well with the left leg, part of this is no doubt inadequate compression wrapping a friend's home which is sometimes just over the wound area. She has 2 new areas on the lateral part of the left leg the original wound on the medial part. We finally did get the x-ray of the lower sacrum which was done at the facility that was negative Electronic Signature(s) Signed: 04/19/2020 4:22:50 PM By: Linton Ham MD Entered By: Linton Ham on 04/19/2020 11:50:12 -------------------------------------------------------------------------------- Physical Exam Details Patient Name: Date of Service: [name], [name] 04/19/2020 10:45 A M Medical Record Number: [id number] Patient Account Number: [id number] Date of Birth/Sex: Treating RN: Dec 19, 1922 (86 y.o. Terri Acosta Primary Care Provider: MA ST, MA N Other Clinician: Referring Provider: Treating Provider/Extender: Linton Ham MA ST, MA N Weeks in Treatment: 3 Cardiovascular . Notes Wound exam; stage IV pressure ulcer but no palpable bone the patient had a polypoid growth coming out of 1 area. I remove this with pickups and #15 scalpel. Hemostasis with direct pressure. On the left lateral leg she has 2 new areas. The original wound just medial to the tibia. She has 2-3+ edema in the leg Electronic Signature(s) Signed: 04/19/2020 4:22:50 PM By: Linton Ham MD Entered By: Linton Ham on 04/19/2020  11:55:27 -------------------------------------------------------------------------------- Physician Orders Details Patient Name: Date of Service: [name], [name] 04/19/2020 10:45 A M Medical Record Number: [id number] Patient Account Number: [id number] Date of Birth/Sex: Treating RN: October 16, 1922 (86 y.o. Terri Acosta Primary Care Provider: MA ST, MA N Other Clinician: Referring Provider: Treating Provider/Extender: Linton Ham MA ST, MA N Weeks in Treatment: 3 Verbal / Phone Orders: No Diagnosis Coding ICD-10 Coding Code Description L97.221 Non-pressure chronic ulcer of left calf limited to breakdown of skin L89.154 Pressure ulcer of sacral region, stage 4 I48.11 Longstanding persistent atrial fibrillation Follow-up Appointments Return Appointment in 2 weeks. Dressing Change Frequency Change dressing every day. - all wounds Skin Barriers/Peri-Wound Care Moisturizing lotion - left leg daily Wound Cleansing Clean wound with Normal Saline. - or wound cleanser Primary Wound Dressing Wound #2 Left,Distal,Lateral Lower Leg Calcium Alginate with Silver Wound #3 Left,Posterior Lower Leg Calcium Alginate with Silver Wound #4 Sacrum Silver Collagen - apply saline moistened gauze over collagen Wound #5 Left,Anterior Lower Leg Calcium Alginate with Silver Wound #6 Left,Proximal,Lateral Lower Leg Calcium Alginate with Silver Secondary Dressing Dry Gauze - all leg wounds Wound #4 Sacrum Foam Border Edema Control Kerlix and Coban - Left Lower Extremity - ***WRAP FROM BASE OF TOES TO JUST BELOW BEND OF KNEE*** Avoid standing for long periods of time Elevate legs to the level of the heart or above for 30 minutes daily and/or when sitting, a frequency of: - throughout the day Off-Loading Low air-loss mattress (Group 2) Roho cushion for wheelchair Turn and reposition every 2 hours Electronic Signature(s) Signed: 04/19/2020 4:22:50 PM By: Linton Ham MD Signed: 04/19/2020  4:24:12 PM By: [name] RN, BSN Entered By: [name] on 04/19/2020 11:24:00 -------------------------------------------------------------------------------- Problem List Details Patient Name: Date of Service: [name], [name] 04/19/2020 10:45 A M Medical Record Number: [id number] Patient Account Number: [id number]  Date of Birth/Sex: Treating RN: Feb 10, 1923 (86 y.o. Terri Acosta Primary Care Provider: MA ST, MA N Other Clinician: Referring Provider: Treating Provider/Extender: Linton Ham MA ST, MA N Weeks in Treatment: 3 Active Problems ICD-10 Encounter Code Description Active Date MDM Diagnosis L97.221 Non-pressure chronic ulcer of left calf limited to breakdown of skin 03/29/2020 No Yes L89.154 Pressure ulcer of sacral region, stage 4 04/05/2020 No Yes I48.11 Longstanding persistent atrial fibrillation 03/29/2020 No Yes I87.312 Chronic venous hypertension (idiopathic) with ulcer of left lower extremity 04/19/2020 No Yes Inactive Problems Resolved Problems Electronic Signature(s) Signed: 04/19/2020 4:22:50 PM By: Linton Ham MD Entered By: Linton Ham on 04/19/2020 11:29:13 -------------------------------------------------------------------------------- Progress Note Details Patient Name: Date of Service: HA NSO Terri Acosta, [name] 04/19/2020 10:45 A M Medical Record Number: [id number] Patient Account Number: [id number] Date of Birth/Sex: Treating RN: [birth year] (86 y.o. Terri Acosta Primary Care Provider: MA ST, MA N Other Clinician: Referring Provider: Treating Provider/Extender: Linton Ham MA ST, MA N Weeks in Treatment: 3 Subjective History of Present Illness (HPI) 86 year old female who was admitted in late May and discharged early June for suspected infectious diarrhea and after undergoing inpatient management with hydration, IV antibiotics, she was released to skilled nursing facility for recovery, during this time she developed a sacral wound  that was classified as stage III for which they have been using Santyl and gauze packing with saline with daily changes. She also developed left leg skin tears and then shallow wounds that were addressed with silver alginate dressings. Patient originally is from assisted living and owing to her acute illness in June was transition to skilled nursing and the intention is for her to go back to assisted living once her recovery is complete. Patient denies any significant pain or discomfort in the sacrum or the leg areas. Intake and appetite have been good, denies any diarrhea or constipation. She denies any fevers or chills. Patient has atrial fibrillation with irregular heart rhythm with frequent episodes of tachycardia in the 100-120 range, she also runs borderline low blood pressures. Other medical problems includes chronic anemia latest hemoglobin 8.3 she is on iron supplements, mild anasarca with cirrhosis identified per CT scan of the abdomen, infrarenal AAA with partial clot 3.5 cm size. ABI in the clinic was difficult to obtain owing to edema in the leg, marked at 0.62, note that CT scan performed in the hospital showed significant atherosclerotic disease in the major arteries 8/16; this is a patient who is at the skilled unit of [facility] although she was in the independent up until May. She was hospitalized and developed a pressure sore on her sacrum. Apparently she also had some abrasions to her left lower leg which was traumatic although I know none of the details. She was noted in this clinic to probably have PAD although this is not been aggressively investigated. We did ask for a x-ray of the lower sacrum according to the patient who seems cognitively intact that has not been done 8/30; 2-week follow-up. She is [facility] [place]. Pressure sore on her sacrum not much change today although the surface looks somewhat better with the Santyl. We are not  doing well with the left leg, part of this is no doubt inadequate compression wrapping a friend's home which is sometimes just over the wound area. She has 2 new areas on the lateral part of the left leg the original wound on the medial part. We finally did  get the x-ray of the lower sacrum which was done at the facility that was negative Objective Constitutional Vitals Time Taken: 10:53 AM, Height: 64 in, Source: Stated, Weight: 90 lbs, Source: Stated, BMI: 15.4, Temperature: 98.1 F, Pulse: 106 bpm, Respiratory Rate: 18 breaths/min, Blood Pressure: 122/84 mmHg. General Notes: Wound exam; stage IV pressure ulcer but no palpable bone the patient had a polypoid growth coming out of 1 area. I remove this with pickups and #15 scalpel. Hemostasis with direct pressure. ooOn the left lateral leg she has 2 new areas. The original wound just medial to the tibia. She has 2-3+ edema in the leg Integumentary (Hair, Skin) Wound #2 status is Open. Original cause of wound was Trauma. The wound is located on the Left,Distal,Lateral Lower Leg. The wound measures 1.2cm length x 0.8cm width x 0.1cm depth; 0.754cm^2 area and 0.075cm^3 volume. There is Fat Layer (Subcutaneous Tissue) exposed. There is no tunneling or undermining noted. There is a medium amount of serosanguineous drainage noted. The wound margin is flat and intact. There is large (67-100%) red granulation within the wound bed. There is no necrotic tissue within the wound bed. Wound #3 status is Open. Original cause of wound was Gradually Appeared. The wound is located on the Left,Posterior Lower Leg. The wound measures 1.9cm length x 2cm width x 0.1cm depth; 2.985cm^2 area and 0.298cm^3 volume. There is Fat Layer (Subcutaneous Tissue) exposed. There is no tunneling or undermining noted. There is a medium amount of serous drainage noted. The wound margin is flat and intact. There is large (67-100%) red granulation within the wound bed. There is no  necrotic tissue within the wound bed. Wound #4 status is Open. Original cause of wound was Gradually Appeared. The wound is located on the Sacrum. The wound measures 2.5cm length x 2cm width x 1.5cm depth; 3.927cm^2 area and 5.89cm^3 volume. There is Fat Layer (Subcutaneous Tissue) exposed. There is no tunneling noted, however, there is undermining starting at 10:00 and ending at 3:00 with a maximum distance of 1.2cm. There is a medium amount of serous drainage noted. The wound margin is well defined and not attached to the wound base. There is large (67-100%) pink granulation within the wound bed. There is a small (1-33%) amount of necrotic tissue within the wound bed including Adherent Slough. Wound #5 status is Open. Original cause of wound was Gradually Appeared. The wound is located on the Left,Anterior Lower Leg. The wound measures 1.6cm length x 1.2cm width x 0.1cm depth; 1.508cm^2 area and 0.151cm^3 volume. There is Fat Layer (Subcutaneous Tissue) exposed. There is no tunneling or undermining noted. There is a small amount of serosanguineous drainage noted. The wound margin is flat and intact. There is medium (34-66%) pink granulation within the wound bed. There is a medium (34-66%) amount of necrotic tissue within the wound bed including Adherent Slough. Wound #6 status is Open. Original cause of wound was Gradually Appeared. The wound is located on the Left,Proximal,Lateral Lower Leg. The wound measures 0.8cm length x 0.5cm width x 0.1cm depth; 0.314cm^2 area and 0.031cm^3 volume. There is Fat Layer (Subcutaneous Tissue) exposed. There is no tunneling or undermining noted. There is a small amount of serosanguineous drainage noted. The wound margin is flat and intact. There is large (67-100%) red granulation within the wound bed. There is no necrotic tissue within the wound bed. Assessment Active Problems ICD-10 Non-pressure chronic ulcer of left calf limited to breakdown of skin Pressure  ulcer of sacral region, stage 4 Longstanding persistent  atrial fibrillation Chronic venous hypertension (idiopathic) with ulcer of left lower extremity Procedures Wound #4 Pre-procedure diagnosis of Wound #4 is a Pressure Ulcer located on the Sacrum . There was a Excisional Skin/Subcutaneous Tissue Debridement with a total area of 1 sq cm performed by Ricard Dillon., MD. With the following instrument(s): Blade, and Forceps to remove Non-Viable tissue/material. Material removed includes Subcutaneous Tissue. No specimens were taken. A time out was conducted at 11:18, prior to the start of the procedure. A Minimum amount of bleeding was controlled with Pressure. The procedure was tolerated well with a pain level of 0 throughout and a pain level of 0 following the procedure. Post Debridement Measurements: 2.5cm length x 2cm width x 1.5cm depth; 5.89cm^3 volume. Post debridement Stage noted as Category/Stage IV. Character of Wound/Ulcer Post Debridement is improved. Post procedure Diagnosis Wound #4: Same as Pre-Procedure Plan Follow-up Appointments: Return Appointment in 2 weeks. Dressing Change Frequency: Change dressing every day. - all wounds Skin Barriers/Peri-Wound Care: Moisturizing lotion - left leg daily Wound Cleansing: Clean wound with Normal Saline. - or wound cleanser Primary Wound Dressing: Wound #2 Left,Distal,Lateral Lower Leg: Calcium Alginate with Silver Wound #3 Left,Posterior Lower Leg: Calcium Alginate with Silver Wound #4 Sacrum: Silver Collagen - apply saline moistened gauze over collagen Wound #5 Left,Anterior Lower Leg: Calcium Alginate with Silver Wound #6 Left,Proximal,Lateral Lower Leg: Calcium Alginate with Silver Secondary Dressing: Dry Gauze - all leg wounds Wound #4 Sacrum: Foam Border Edema Control: Kerlix and Coban - Left Lower Extremity - ***WRAP FROM BASE OF TOES TO JUST BELOW BEND OF KNEE*** Avoid standing for long periods of time Elevate  legs to the level of the heart or above for 30 minutes daily and/or when sitting, a frequency of: - throughout the day Off-Loading: Low air-loss mattress (Group 2) Roho cushion for wheelchair Turn and reposition every 2 hours 1. The patient is a for is a physcially frail but allert 86 year old I change the primary dressing from Santyl to silver collagen with backing wet-to-dry. We need to verify whether the facility has wound VAC capabilities 2. On the left leg there is not any edema control. We are using kerlix Coban largely because she poor ABIs at 0.62. Nevertheless it does not appear like they are applying this properly Electronic Signature(s) Signed: 04/19/2020 4:22:50 PM By: Linton Ham MD Entered By: Linton Ham on 04/19/2020 12:00:00 -------------------------------------------------------------------------------- SuperBill Details Patient Name: Date of Service: HA NSO Terri Acosta, [name] 04/19/2020 Medical Record Number: [id number] Patient Account Number: [id number] Date of Birth/Sex: Treating RN: 18-Sep-1922 (86 y.o. Terri Acosta Primary Care Provider: MA ST, MA N Other Clinician: Referring Provider: Treating Provider/Extender: Linton Ham MA ST, MA N Weeks in Treatment: 3 Diagnosis Coding ICD-10 Codes Code Description [phone] Non-pressure chronic ulcer of left calf limited to breakdown of skin L89.154 Pressure ulcer of sacral region, stage 4 I48.11 Longstanding persistent atrial fibrillation I87.312 Chronic venous hypertension (idiopathic) with ulcer of left lower extremity Facility Procedures CPT4 Code: [id number] Description: 14970 - DEB SUBQ TISSUE 20 SQ CM/< ICD-10 Diagnosis Description L89.154 Pressure ulcer of sacral region, stage 4 Modifier: Quantity: 1 Physician Procedures Electronic Signature(s) Signed: 04/19/2020 4:22:50 PM By: Linton Ham MD Entered By: Linton Ham on 04/19/2020 12:00:17

## 2020-04-19 NOTE — Progress Notes (Signed)
MAKHIYA, COBURN ([id number]) Visit Report for 04/19/2020 Arrival Information Details Patient Name: Date of Service: HA NSO Terri Acosta, [name] IS Acosta. 04/19/2020 10:45 Acosta M Medical Record Number: [id number] Patient Account Number: [id number] Date of Birth/Sex: Treating RN: 1923/01/19 (86 y.o. Elam Dutch Primary Care Sukhraj Esquivias: MA ST, MA N Other Clinician: Referring Beckie Viscardi: Treating Viviene Thurston/Extender: Linton Ham MA ST, MA N Weeks in Treatment: 3 Visit Information History Since Last Visit Added or deleted any medications: No Patient Arrived: Wheel Chair Any new allergies or adverse reactions: No Arrival Time: 10:49 Had Acosta fall or experienced change in No Accompanied By: self activities of daily living that may affect Transfer Assistance: EasyPivot Patient Lift risk of falls: Patient Identification Verified: Yes Signs or symptoms of abuse/neglect since last visito No Secondary Verification Process Completed: Yes Hospitalized since last visit: No Patient Requires Transmission-Based Precautions: No Implantable device outside of the clinic excluding No Patient Has Alerts: No cellular tissue based products placed in the center since last visit: Has Dressing in Place as Prescribed: Yes Pain Present Now: No Electronic Signature(s) Signed: 04/19/2020 4:32:31 PM By: Baruch Gouty RN, BSN Entered By: Baruch Gouty on 04/19/2020 10:53:42 -------------------------------------------------------------------------------- Encounter Discharge Information Details Patient Name: Date of Service: [name], Terri Acosta. 04/19/2020 10:45 Acosta M Medical Record Number: [id number] Patient Account Number: [id number] Date of Birth/Sex: Treating RN: 02-23-1923 (86 y.o. Clearnce Sorrel Primary Care Thelton Graca: MA ST, MA N Other Clinician: Referring Violet Seabury: Treating Johnny Gorter/Extender: Linton Ham MA ST, MA N Weeks in Treatment: 3 Encounter Discharge Information Items Post Procedure Vitals Discharge  Condition: Stable Temperature (F): 98.1 Ambulatory Status: Wheelchair Pulse (bpm): 106 Discharge Destination: [place] Respiratory Rate (breaths/min): 18 Telephoned: No Blood Pressure (mmHg): 122/84 Orders Sent: Yes Transportation: Other Accompanied By: self Schedule Follow-up Appointment: Yes Clinical Summary of Care: Patient Declined Electronic Signature(s) Signed: 04/19/2020 4:26:23 PM By: Kela Millin Entered By: Kela Millin on 04/19/2020 11:33:55 -------------------------------------------------------------------------------- Lower Extremity Assessment Details Patient Name: Date of Service: [name], Terri Acosta. 04/19/2020 10:45 Acosta M Medical Record Number: [id number] Patient Account Number: [id number] Date of Birth/Sex: Treating RN: 1923-01-06 (86 y.o. Elam Dutch Primary Care Labaron Digirolamo: MA ST, MA N Other Clinician: Referring Talena Neira: Treating Angelynn Lemus/Extender: Linton Ham MA ST, MA N Weeks in Treatment: 3 Edema Assessment Assessed: [Left: No] [Right: No] Edema: [Left: Ye] [Right: s] Calf Left: Right: Point of Measurement: 38 cm From Medial Instep 30.4 cm cm Ankle Left: Right: Point of Measurement: 11 cm From Medial Instep 19.5 cm cm Vascular Assessment Pulses: Dorsalis Pedis Palpable: [Left:No] Electronic Signature(s) Signed: 04/19/2020 4:32:31 PM By: Baruch Gouty RN, BSN Entered By: Baruch Gouty on 04/19/2020 10:59:01 -------------------------------------------------------------------------------- Multi Wound Chart Details Patient Name: Date of Service: HA NSO Terri Acosta, Terri Acosta. 04/19/2020 10:45 Acosta M Medical Record Number: [id number] Patient Account Number: [id number] Date of Birth/Sex: Treating RN: 29-Dec-1922 (86 y.o. Terri Acosta Primary Care Aidah Forquer: MA ST, MA N Other Clinician: Referring Ranyah Groeneveld: Treating Riggins Cisek/Extender: Linton Ham MA ST, MA N Weeks in Treatment: 3 Vital Signs Height(in): 64 Pulse(bpm):  106 Weight(lbs): 90 Blood Pressure(mmHg): 122/84 Body Mass Index(BMI): 15 Temperature(F): 98.1 Respiratory Rate(breaths/min): 18 Photos: [2:No Photos Left, Distal, Lateral Lower Leg] [3:No Photos Left, Posterior Lower Leg] [4:No Photos Sacrum] Wound Location: [2:Trauma] [3:Gradually Appeared] [4:Gradually Appeared] Wounding Event: [2:Skin Tear] [3:Skin Tear] [4:Pressure Ulcer] Primary Etiology: [2:Glaucoma, Hypertension] [3:Glaucoma, Hypertension] [4:Glaucoma, Hypertension] Comorbid History: [2:03/16/2020] [3:03/16/2020] [4:03/29/2020] Date Acquired: [2:3] [3:3] [4:3] Weeks of Treatment: [2:Open] [3:Open] [4:Open] Wound Status: [2:1.2x0.8x0.1] [  3:1.9x2x0.1] [4:2.5x2x1.5] Measurements L x W x D (cm) [2:0.754] [3:2.985] [4:3.927] Acosta (cm) : rea [2:0.075] [3:0.298] [4:5.89] Volume (cm) : [2:-380.30%] [3:71.80%] [4:33.30%] % Reduction in Acosta rea: [2:-368.70%] [3:71.90%] [4:44.40%] % Reduction in Volume: [4:10] Starting Position 1 (o'clock): [4:3] Ending Position 1 (o'clock): [4:1.2] Maximum Distance 1 (cm): [2:No] [3:No] [4:Yes] Undermining: [2:Full Thickness Without Exposed] [3:Full Thickness Without Exposed] [4:Category/Stage IV] Classification: [2:Support Structures Medium] [3:Support Structures Medium] [4:Medium] Exudate Acosta mount: [2:Serosanguineous] [3:Serous] [4:Serous] Exudate Type: [2:red, brown] [3:amber] [4:amber] Exudate Color: [2:Flat and Intact] [3:Flat and Intact] [4:Well defined, not attached] Wound Margin: [2:Large (67-100%)] [3:Large (67-100%)] [4:Large (67-100%)] Granulation Acosta mount: [2:Red] [3:Red] [4:Pink] Granulation Quality: [2:None Present (0%)] [3:None Present (0%)] [4:Small (1-33%)] Necrotic Acosta mount: [2:Fat Layer (Subcutaneous Tissue): Yes Fat Layer (Subcutaneous Tissue): Yes Fat Layer (Subcutaneous Tissue): Yes] Exposed Structures: [2:Fascia: No Tendon: No Muscle: No Joint: No Bone: No Small (1-33%)] [3:Fascia: No Tendon: No Muscle: No Joint: No Bone: No Medium  (34-66%)] [4:Fascia: No Tendon: No Muscle: No Joint: No Bone: No None] Epithelialization: [2:N/Acosta] [3:N/Acosta] [4:Debridement - Excisional] Debridement: Pre-procedure Verification/Time Out N/Acosta [3:N/Acosta] [4:11:18] Taken: [2:N/Acosta] [3:N/Acosta] [4:Subcutaneous] Tissue Debrided: [2:N/Acosta] [3:N/Acosta] [4:Skin/Subcutaneous Tissue] Level: [2:N/Acosta] [3:N/Acosta] [4:1] Debridement Acosta (sq cm): [2:rea N/Acosta] [3:N/Acosta] [4:Blade, Forceps] Instrument: [2:N/Acosta] [3:N/Acosta] [4:Minimum] Bleeding: [2:N/Acosta] [3:N/Acosta] [4:Pressure] Hemostasis Acosta chieved: [2:N/Acosta] [3:N/Acosta] [4:0] Procedural Pain: [2:N/Acosta] [3:N/Acosta] [4:0] Post Procedural Pain: [2:N/Acosta] [3:N/Acosta] [4:Procedure was tolerated well] Debridement Treatment Response: [2:N/Acosta] [3:N/Acosta] [4:2.5x2x1.5] Post Debridement Measurements L x W x D (cm) [2:N/Acosta] [3:N/Acosta] [4:5.89] Post Debridement Volume: (cm) [2:N/Acosta] [3:N/Acosta] [4:Category/Stage IV] Post Debridement Stage: [2:N/Acosta] [3:N/Acosta] [4:Debridement] Wound Number: 5 6 N/Acosta Photos: No Photos No Photos N/Acosta Left, Anterior Lower Leg Left, Proximal, Lateral Lower Leg N/Acosta Wound Location: Gradually Appeared Gradually Appeared N/Acosta Wounding Event: Venous Leg Ulcer Venous Leg Ulcer N/Acosta Primary Etiology: Glaucoma, Hypertension Glaucoma, Hypertension N/Acosta Comorbid History: 04/19/2020 04/19/2020 N/Acosta Date Acquired: 0 0 N/Acosta Weeks of Treatment: Open Open N/Acosta Wound Status: 1.6x1.2x0.1 0.8x0.5x0.1 N/Acosta Measurements L x W x D (cm) 1.508 0.314 N/Acosta Acosta (cm) : rea 0.151 0.031 N/Acosta Volume (cm) : N/Acosta N/Acosta N/Acosta % Reduction in Acosta rea: N/Acosta N/Acosta N/Acosta % Reduction in Volume: No No N/Acosta Undermining: Full Thickness Without Exposed Full Thickness Without Exposed N/Acosta Classification: Support Structures Support Structures Small Small N/Acosta Exudate Acosta mount: Serosanguineous Serosanguineous N/Acosta Exudate Type: red, brown red, brown N/Acosta Exudate Color: Flat and Intact Flat and Intact N/Acosta Wound Margin: Medium (34-66%) Large (67-100%) N/Acosta Granulation Acosta mount: Pink Red N/Acosta Granulation  Quality: Medium (34-66%) None Present (0%) N/Acosta Necrotic Acosta mount: Fat Layer (Subcutaneous Tissue): Yes Fat Layer (Subcutaneous Tissue): Yes N/Acosta Exposed Structures: Fascia: No Fascia: No Tendon: No Tendon: No Muscle: No Muscle: No Joint: No Joint: No Bone: No Bone: No Small (1-33%) Small (1-33%) N/Acosta Epithelialization: N/Acosta N/Acosta N/Acosta Debridement: N/Acosta N/Acosta N/Acosta Tissue Debrided: N/Acosta N/Acosta N/Acosta Level: N/Acosta N/Acosta N/Acosta Debridement Acosta (sq cm): rea N/Acosta N/Acosta N/Acosta Instrument: N/Acosta N/Acosta N/Acosta Bleeding: N/Acosta N/Acosta N/Acosta Hemostasis Acosta chieved: N/Acosta N/Acosta N/Acosta Procedural Pain: N/Acosta N/Acosta N/Acosta Post Procedural Pain: Debridement Treatment Response: N/Acosta N/Acosta N/Acosta Post Debridement Measurements L x N/Acosta N/Acosta N/Acosta W x D (cm) N/Acosta N/Acosta N/Acosta Post Debridement Volume: (cm) N/Acosta N/Acosta N/Acosta Post Debridement Stage: N/Acosta N/Acosta N/Acosta Procedures Performed: Treatment Notes Electronic Signature(s) Signed: 04/19/2020 4:22:50 PM By: Linton Ham MD Signed: 04/19/2020 4:24:12 PM By: [name] RN, BSN Entered By: Linton Ham on 04/19/2020 11:29:24 -------------------------------------------------------------------------------- Multi-Disciplinary Care Plan Details Patient Name: Date of  Service: [name], Terri Acosta. 04/19/2020 10:45 Acosta M Medical Record Number: [id number] Patient Account Number: [id number] Date of Birth/Sex: Treating RN: 14-Sep-1922 (86 y.o. Terri Acosta Primary Care Lexus Barletta: MA ST, MA N Other Clinician: Referring Kalisi Bevill: Treating Dominick Morella/Extender: Linton Ham MA ST, MA N Weeks in Treatment: 3 Active Inactive Abuse / Safety / Falls / Self Care Management Nursing Diagnoses: Potential for falls Potential for injury related to falls Goals: Patient will not experience any injury related to falls Date Initiated: 03/29/2020 Target Resolution Date: 04/30/2020 Goal Status: Active Patient/caregiver will verbalize/demonstrate measures taken to prevent injury and/or falls Date Initiated: 03/29/2020 Target  Resolution Date: 04/30/2020 Goal Status: Active Interventions: Assess Activities of Daily Living upon admission and as needed Assess fall risk on admission and as needed Assess: immobility, friction, shearing, incontinence upon admission and as needed Assess impairment of mobility on admission and as needed per policy Assess personal safety and home safety (as indicated) on admission and as needed Assess self care needs on admission and as needed Provide education on fall prevention Provide education on personal and home safety Notes: Pressure Nursing Diagnoses: Knowledge deficit related to causes and risk factors for pressure ulcer development Knowledge deficit related to management of pressures ulcers Potential for impaired tissue integrity related to pressure, friction, moisture, and shear Goals: Patient/caregiver will verbalize risk factors for pressure ulcer development Date Initiated: 03/29/2020 Target Resolution Date: 04/30/2020 Goal Status: Active Patient/caregiver will verbalize understanding of pressure ulcer management Date Initiated: 03/29/2020 Target Resolution Date: 04/30/2020 Goal Status: Active Interventions: Assess: immobility, friction, shearing, incontinence upon admission and as needed Assess offloading mechanisms upon admission and as needed Assess potential for pressure ulcer upon admission and as needed Provide education on pressure ulcers Notes: Wound/Skin Impairment Nursing Diagnoses: Impaired tissue integrity Knowledge deficit related to ulceration/compromised skin integrity Goals: Patient/caregiver will verbalize understanding of skin care regimen Date Initiated: 03/29/2020 Target Resolution Date: 04/30/2020 Goal Status: Active Interventions: Assess patient/caregiver ability to obtain necessary supplies Assess patient/caregiver ability to perform ulcer/skin care regimen upon admission and as needed Assess ulceration(s) every visit Provide education on  ulcer and skin care Notes: Electronic Signature(s) Signed: 04/19/2020 4:24:12 PM By: [name] RN, BSN Entered By: [name] on 04/19/2020 10:58:56 -------------------------------------------------------------------------------- Pain Assessment Details Patient Name: Date of Service: [name], Terri Acosta. 04/19/2020 10:45 Acosta M Medical Record Number: [id number] Patient Account Number: [id number] Date of Birth/Sex: Treating RN: 02/17/1923 (86 y.o. Elam Dutch Primary Care Hung Rhinesmith: MA ST, MA N Other Clinician: Referring Kenzie Thoreson: Treating Toni Hoffmeister/Extender: Linton Ham MA ST, MA N Weeks in Treatment: 3 Active Problems Location of Pain Severity and Description of Pain Patient Has Paino No Site Locations Rate the pain. Current Pain Level: 0 Pain Management and Medication Current Pain Management: Electronic Signature(s) Signed: 04/19/2020 4:32:31 PM By: Baruch Gouty RN, BSN Entered By: Baruch Gouty on 04/19/2020 10:55:09 -------------------------------------------------------------------------------- Patient/Caregiver Education Details Patient Name: Date of Service: HA NSO Terri Acosta, Terri Acosta. 8/30/2021andnbsp10:45 Acosta M Medical Record Number: [id number] Patient Account Number: [id number] Date of Birth/Gender: Treating RN: April 21, 1923 (86 y.o. Terri Acosta Primary Care Physician: MA ST, MA N Other Clinician: Referring Physician: Treating Physician/Extender: Linton Ham MA ST, MA N Weeks in Treatment: 3 Education Assessment Education Provided To: Patient Education Topics Provided Pressure: Methods: Explain/Verbal Responses: State content correctly Wound/Skin Impairment: Methods: Explain/Verbal Responses: State content correctly Electronic Signature(s) Signed: 04/19/2020 4:24:12 PM By: [name] RN, BSN Entered By: [name] on 04/19/2020 10:59:11 -------------------------------------------------------------------------------- Wound Assessment  Details  Patient Name: Date of Service: [name], [name] Acosta. 04/19/2020 10:45 Acosta M Medical Record Number: [id number] Patient Account Number: [id number] Date of Birth/Sex: Treating RN: [birth year] (86 y.o. Elam Dutch Primary Care Lakelynn Severtson: MA ST, MA N Other Clinician: Referring Miria Cappelli: Treating Angel Hobdy/Extender: Linton Ham MA ST, MA N Weeks in Treatment: 3 Wound Status Wound Number: 2 Primary Etiology: Skin Tear Wound Location: Left, Distal, Lateral Lower Leg Wound Status: Open Wounding Event: Trauma Comorbid History: Glaucoma, Hypertension Date Acquired: 03/16/2020 Weeks Of Treatment: 3 Clustered Wound: No Wound Measurements Length: (cm) 1.2 Width: (cm) 0.8 Depth: (cm) 0.1 Area: (cm) 0.754 Volume: (cm) 0.075 % Reduction in Area: -380.3% % Reduction in Volume: -368.7% Epithelialization: Small (1-33%) Tunneling: No Undermining: No Wound Description Classification: Full Thickness Without Exposed Support Structures Wound Margin: Flat and Intact Exudate Amount: Medium Exudate Type: Serosanguineous Exudate Color: red, brown Foul Odor After Cleansing: No Slough/Fibrino Yes Wound Bed Granulation Amount: Large (67-100%) Exposed Structure Granulation Quality: Red Fascia Exposed: No Necrotic Amount: None Present (0%) Fat Layer (Subcutaneous Tissue) Exposed: Yes Tendon Exposed: No Muscle Exposed: No Joint Exposed: No Bone Exposed: No Treatment Notes Wound #2 (Left, Distal, Lateral Lower Leg) 1. Cleanse With Wound Cleanser Soap and water 2. Periwound Care Moisturizing lotion 3. Primary Dressing Applied Calcium Alginate Ag 4. Secondary Dressing ABD Pad Dry Gauze 6. Support Layer Holiday representative) Signed: 04/19/2020 4:32:31 PM By: Baruch Gouty RN, BSN Entered By: Baruch Gouty on 04/19/2020 11:06:46 -------------------------------------------------------------------------------- Wound Assessment Details Patient  Name: Date of Service: [name], Terri Acosta. 04/19/2020 10:45 Acosta M Medical Record Number: [id number] Patient Account Number: [id number] Date of Birth/Sex: Treating RN: 1923/03/14 (86 y.o. Elam Dutch Primary Care Sherrell Farish: MA ST, MA N Other Clinician: Referring Yoona Ishii: Treating Timithy Arons/Extender: Linton Ham MA ST, MA N Weeks in Treatment: 3 Wound Status Wound Number: 3 Primary Etiology: Skin Tear Wound Location: Left, Posterior Lower Leg Wound Status: Open Wounding Event: Gradually Appeared Comorbid History: Glaucoma, Hypertension Date Acquired: 03/16/2020 Weeks Of Treatment: 3 Clustered Wound: No Wound Measurements Length: (cm) 1.9 Width: (cm) 2 Depth: (cm) 0.1 Area: (cm) 2.985 Volume: (cm) 0.298 % Reduction in Area: 71.8% % Reduction in Volume: 71.9% Epithelialization: Medium (34-66%) Tunneling: No Undermining: No Wound Description Classification: Full Thickness Without Exposed Support Structures Wound Margin: Flat and Intact Exudate Amount: Medium Exudate Type: Serous Exudate Color: amber Foul Odor After Cleansing: No Slough/Fibrino No Wound Bed Granulation Amount: Large (67-100%) Exposed Structure Granulation Quality: Red Fascia Exposed: No Necrotic Amount: None Present (0%) Fat Layer (Subcutaneous Tissue) Exposed: Yes Tendon Exposed: No Muscle Exposed: No Joint Exposed: No Bone Exposed: No Treatment Notes Wound #3 (Left, Posterior Lower Leg) 1. Cleanse With Wound Cleanser Soap and water 2. Periwound Care Moisturizing lotion 3. Primary Dressing Applied Calcium Alginate Ag 4. Secondary Dressing ABD Pad Dry Gauze 6. Support Layer Holiday representative) Signed: 04/19/2020 4:32:31 PM By: Baruch Gouty RN, BSN Entered By: Baruch Gouty on 04/19/2020 11:07:32 -------------------------------------------------------------------------------- Wound Assessment Details Patient Name: Date of Service: [name], Terri Acosta.  04/19/2020 10:45 Acosta M Medical Record Number: [id number] Patient Account Number: [id number] Date of Birth/Sex: Treating RN: 1922/09/19 (86 y.o. Elam Dutch Primary Care Aylana Hirschfeld: MA ST, MA N Other Clinician: Referring Micayla Brathwaite: Treating Bently Wyss/Extender: Linton Ham MA ST, MA N Weeks in Treatment: 3 Wound Status Wound Number: 4 Primary Etiology: Pressure Ulcer Wound Location: Sacrum Wound Status: Open Wounding Event: Gradually Appeared Comorbid History: Glaucoma, Hypertension Date Acquired: 03/29/2020 [place]  Of Treatment: 3 Clustered Wound: No Wound Measurements Length: (cm) 2.5 Width: (cm) 2 Depth: (cm) 1.5 Area: (cm) 3.927 Volume: (cm) 5.89 % Reduction in Area: 33.3% % Reduction in Volume: 44.4% Epithelialization: None Tunneling: No Undermining: Yes Starting Position (o'clock): 10 Ending Position (o'clock): 3 Maximum Distance: (cm) 1.2 Wound Description Classification: Category/Stage IV Wound Margin: Well defined, not attached Exudate Amount: Medium Exudate Type: Serous Exudate Color: amber Foul Odor After Cleansing: No Slough/Fibrino Yes Wound Bed Granulation Amount: Large (67-100%) Exposed Structure Granulation Quality: Pink Fascia Exposed: No Necrotic Amount: Small (1-33%) Fat Layer (Subcutaneous Tissue) Exposed: Yes Necrotic Quality: Adherent Slough Tendon Exposed: No Muscle Exposed: No Joint Exposed: No Bone Exposed: No Treatment Notes Wound #4 (Sacrum) 1. Cleanse With Wound Cleanser 2. Periwound Care Skin Prep 3. Primary Dressing Applied Collegen AG 4. Secondary Dressing Foam Border Dressing Notes saline moistened gauze packing Electronic Signature(s) Signed: 04/19/2020 4:32:31 PM By: Baruch Gouty RN, BSN Entered By: Baruch Gouty on 04/19/2020 11:11:21 -------------------------------------------------------------------------------- Wound Assessment Details Patient Name: Date of Service: [name], Terri Acosta. 04/19/2020 10:45 Acosta  M Medical Record Number: [id number] Patient Account Number: [id number] Date of Birth/Sex: Treating RN: 13-Apr-1923 (86 y.o. Elam Dutch Primary Care Isys Tietje: MA ST, MA N Other Clinician: Referring Lovella Hardie: Treating Julya Alioto/Extender: Linton Ham MA ST, MA N Weeks in Treatment: 3 Wound Status Wound Number: 5 Primary Etiology: Venous Leg Ulcer Wound Location: Left, Anterior Lower Leg Wound Status: Open Wounding Event: Gradually Appeared Comorbid History: Glaucoma, Hypertension Date Acquired: 04/19/2020 Weeks Of Treatment: 0 Clustered Wound: No Wound Measurements Length: (cm) 1.6 Width: (cm) 1.2 Depth: (cm) 0.1 Area: (cm) 1.508 Volume: (cm) 0.151 % Reduction in Area: % Reduction in Volume: Epithelialization: Small (1-33%) Tunneling: No Undermining: No Wound Description Classification: Full Thickness Without Exposed Support Structures Wound Margin: Flat and Intact Exudate Amount: Small Exudate Type: Serosanguineous Exudate Color: red, brown Foul Odor After Cleansing: No Slough/Fibrino Yes Wound Bed Granulation Amount: Medium (34-66%) Exposed Structure Granulation Quality: Pink Fascia Exposed: No Necrotic Amount: Medium (34-66%) Fat Layer (Subcutaneous Tissue) Exposed: Yes Necrotic Quality: Adherent Slough Tendon Exposed: No Muscle Exposed: No Joint Exposed: No Bone Exposed: No Treatment Notes Wound #5 (Left, Anterior Lower Leg) 1. Cleanse With Saline Soap and water 2. Periwound Care Moisturizing lotion 3. Primary Dressing Applied Calcium Alginate Ag 4. Secondary Dressing ABD Pad Dry Gauze 6. Support Layer Holiday representative) Signed: 04/19/2020 4:32:31 PM By: Baruch Gouty RN, BSN Entered By: Baruch Gouty on 04/19/2020 11:03:03 -------------------------------------------------------------------------------- Wound Assessment Details Patient Name: Date of Service: [name], Terri Acosta. 04/19/2020 10:45 Acosta M Medical  Record Number: [id number] Patient Account Number: [id number] Date of Birth/Sex: Treating RN: 11-24-1922 (86 y.o. Elam Dutch Primary Care Sherion Dooly: MA ST, MA N Other Clinician: Referring Charelle Petrakis: Treating Lonie Rummell/Extender: Linton Ham MA ST, MA N Weeks in Treatment: 3 Wound Status Wound Number: 6 Primary Etiology: Venous Leg Ulcer Wound Location: Left, Proximal, Lateral Lower Leg Wound Status: Open Wounding Event: Gradually Appeared Comorbid History: Glaucoma, Hypertension Date Acquired: 04/19/2020 Weeks Of Treatment: 0 Clustered Wound: No Wound Measurements Length: (cm) 0.8 Width: (cm) 0.5 Depth: (cm) 0.1 Area: (cm) 0.314 Volume: (cm) 0.031 % Reduction in Area: % Reduction in Volume: Epithelialization: Small (1-33%) Tunneling: No Undermining: No Wound Description Classification: Full Thickness Without Exposed Support Structures Wound Margin: Flat and Intact Exudate Amount: Small Exudate Type: Serosanguineous Exudate Color: red, brown Foul Odor After Cleansing: No Slough/Fibrino No Wound Bed Granulation Amount: Large (67-100%) Exposed Structure Granulation Quality:  Red Fascia Exposed: No Necrotic Amount: None Present (0%) Fat Layer (Subcutaneous Tissue) Exposed: Yes Tendon Exposed: No Muscle Exposed: No Joint Exposed: No Bone Exposed: No Treatment Notes Wound #6 (Left, Proximal, Lateral Lower Leg) 1. Cleanse With Wound Cleanser Soap and water 2. Periwound Care Moisturizing lotion 3. Primary Dressing Applied Calcium Alginate Ag 4. Secondary Dressing ABD Pad Dry Gauze 6. Support Layer Holiday representative) Signed: 04/19/2020 4:32:31 PM By: Baruch Gouty RN, BSN Entered By: Baruch Gouty on 04/19/2020 11:05:02 -------------------------------------------------------------------------------- Vitals Details Patient Name: Date of Service: [name], Terri Acosta. 04/19/2020 10:45 Acosta M Medical Record Number:  [id number] Patient Account Number: [id number] Date of Birth/Sex: Treating RN: 08-02-1923 (86 y.o. F) Baruch Gouty Primary Care Kostantinos Tallman: MA ST, MA N Other Clinician: Referring Wofford Stratton: Treating Darian Cansler/Extender: Linton Ham MA ST, MA N Weeks in Treatment: 3 Vital Signs Time Taken: 10:53 Temperature (F): 98.1 Height (in): 64 Pulse (bpm): 106 Source: Stated Respiratory Rate (breaths/min): 18 Weight (lbs): 90 Blood Pressure (mmHg): 122/84 Source: Stated Reference Range: 80 - 120 mg / dl Body Mass Index (BMI): 15.4 Electronic Signature(s) Signed: 04/19/2020 4:32:31 PM By: Baruch Gouty RN, BSN Entered By: [email or web address] Gouty on 04/19/2020 10:54:58

## 2020-05-03 ENCOUNTER — Other Ambulatory Visit: Payer: Self-pay

## 2020-05-03 ENCOUNTER — Encounter (HOSPITAL_BASED_OUTPATIENT_CLINIC_OR_DEPARTMENT_OTHER): Payer: Medicare Other | Attending: Internal Medicine | Admitting: Internal Medicine

## 2020-05-03 DIAGNOSIS — D649 Anemia, unspecified: Secondary | ICD-10-CM | POA: Diagnosis not present

## 2020-05-03 DIAGNOSIS — H409 Unspecified glaucoma: Secondary | ICD-10-CM | POA: Insufficient documentation

## 2020-05-03 DIAGNOSIS — I714 Abdominal aortic aneurysm, without rupture: Secondary | ICD-10-CM | POA: Insufficient documentation

## 2020-05-03 DIAGNOSIS — L89154 Pressure ulcer of sacral region, stage 4: Secondary | ICD-10-CM | POA: Insufficient documentation

## 2020-05-03 DIAGNOSIS — I10 Essential (primary) hypertension: Secondary | ICD-10-CM | POA: Diagnosis not present

## 2020-05-03 DIAGNOSIS — L97221 Non-pressure chronic ulcer of left calf limited to breakdown of skin: Secondary | ICD-10-CM | POA: Insufficient documentation

## 2020-05-03 DIAGNOSIS — K7460 Unspecified cirrhosis of liver: Secondary | ICD-10-CM | POA: Diagnosis not present

## 2020-05-03 DIAGNOSIS — I4811 Longstanding persistent atrial fibrillation: Secondary | ICD-10-CM | POA: Insufficient documentation

## 2020-05-05 NOTE — Progress Notes (Signed)
Terri Acosta, Terri Acosta ([id number]) Visit Report for 05/03/2020 Arrival Information Details Patient Name: Date of Service: [name], [name] IS A. 05/03/2020 10:30 A M Medical Record Number: [id number] Patient Account Number: [id number] Date of Birth/Sex: Treating RN: 23-Aug-1922 (86 y.o. F) Primary Care Aslee Such: MA ST, MA N Other Clinician: Referring Adger Cantera: Treating Rheanne Cortopassi/Extender: Linton Ham MA ST, MA N Weeks in Treatment: 5 Visit Information History Since Last Visit Added or deleted any medications: No Patient Arrived: Terri Acosta Any new allergies or adverse reactions: No Arrival Time: 11:39 Had a fall or experienced change in No Accompanied By: self activities of daily living that may affect Transfer Assistance: Manual risk of falls: Patient Identification Verified: Yes Signs or symptoms of abuse/neglect since last visito No Secondary Verification Process Completed: Yes Hospitalized since last visit: No Patient Requires Transmission-Based Precautions: No Implantable device outside of the clinic excluding No Patient Has Alerts: No cellular tissue based products placed in the center since last visit: Has Dressing in Place as Prescribed: Yes Pain Present Now: No Electronic Signature(s) Signed: 05/04/2020 10:35:16 AM By: Sandre Kitty Entered By: Sandre Kitty on 05/03/2020 11:40:03 -------------------------------------------------------------------------------- Clinic Level of Care Assessment Details Patient Name: Date of Service: [name], [name] 05/03/2020 10:30 A M Medical Record Number: [id number] Patient Account Number: [id number] Date of Birth/Sex: Treating RN: 10/05/1922 (86 y.o. Terri Acosta Primary Care Zalyn Amend: MA ST, MA N Other Clinician: Referring Sujay Grundman: Treating Arren Laminack/Extender: Linton Ham MA ST, MA N Weeks in Treatment: 5 Clinic Level of Care Assessment Items TOOL 4 Quantity Score X- 1 0 Use when only an EandM is performed on FOLLOW-UP  visit ASSESSMENTS - Nursing Assessment / Reassessment X- 1 10 Reassessment of Co-morbidities (includes updates in patient status) '[]'  - 0 Reassessment of Adherence to Treatment Plan ASSESSMENTS - Wound and Skin A ssessment / Reassessment '[]'  - 0 Simple Wound Assessment / Reassessment - one wound X- 4 5 Complex Wound Assessment / Reassessment - multiple wounds '[]'  - 0 Dermatologic / Skin Assessment (not related to wound area) ASSESSMENTS - Focused Assessment '[]'  - 0 Circumferential Edema Measurements - multi extremities '[]'  - 0 Nutritional Assessment / Counseling / Intervention X- 1 5 Lower Extremity Assessment (monofilament, tuning fork, pulses) '[]'  - 0 Peripheral Arterial Disease Assessment (using hand held doppler) ASSESSMENTS - Ostomy and/or Continence Assessment and Care '[]'  - 0 Incontinence Assessment and Management '[]'  - 0 Ostomy Care Assessment and Management (repouching, etc.) PROCESS - Coordination of Care X - Simple Patient / Family Education for ongoing care 1 15 '[]'  - 0 Complex (extensive) Patient / Family Education for ongoing care X- 1 10 Staff obtains Programmer, systems, Records, T Results / Process Orders est X- 1 10 Staff telephones HHA, Nursing Homes / Clarify orders / etc '[]'  - 0 Routine Transfer to another Facility (non-emergent condition) '[]'  - 0 Routine Hospital Admission (non-emergent condition) '[]'  - 0 New Admissions / Biomedical engineer / Ordering NPWT Apligraf, etc. , '[]'  - 0 Emergency Hospital Admission (emergent condition) X- 1 10 Simple Discharge Coordination '[]'  - 0 Complex (extensive) Discharge Coordination PROCESS - Special Needs '[]'  - 0 Pediatric / Minor Patient Management '[]'  - 0 Isolation Patient Management '[]'  - 0 Hearing / Language / Visual special needs '[]'  - 0 Assessment of Community assistance (transportation, D/C planning, etc.) '[]'  - 0 Additional assistance / Altered mentation '[]'  - 0 Support Surface(s) Assessment (bed, cushion, seat,  etc.) INTERVENTIONS - Wound Cleansing / Measurement '[]'  - 0 Simple Wound Cleansing - one wound X-  4 5 Complex Wound Cleansing - multiple wounds X- 1 5 Wound Imaging (photographs - any number of wounds) '[]'  - 0 Wound Tracing (instead of photographs) '[]'  - 0 Simple Wound Measurement - one wound X- 4 5 Complex Wound Measurement - multiple wounds INTERVENTIONS - Wound Dressings '[]'  - 0 Small Wound Dressing one or multiple wounds '[]'  - 0 Medium Wound Dressing one or multiple wounds X- 2 20 Large Wound Dressing one or multiple wounds '[]'  - 0 Application of Medications - topical '[]'  - 0 Application of Medications - injection INTERVENTIONS - Miscellaneous '[]'  - 0 External ear exam '[]'  - 0 Specimen Collection (cultures, biopsies, blood, body fluids, etc.) '[]'  - 0 Specimen(s) / Culture(s) sent or taken to Lab for analysis '[]'  - 0 Patient Transfer (multiple staff / Civil Service fast streamer / Similar devices) '[]'  - 0 Simple Staple / Suture removal (25 or less) '[]'  - 0 Complex Staple / Suture removal (26 or more) '[]'  - 0 Hypo / Hyperglycemic Management (close monitor of Blood Glucose) '[]'  - 0 Ankle / Brachial Index (ABI) - do not check if billed separately X- 1 5 Vital Signs Has the patient been seen at the hospital within the last three years: Yes Total Score: 170 Level Of Care: New/Established - Level 5 Electronic Signature(s) Signed: 05/05/2020 5:57:30 PM By: [name] RN, BSN Entered By: [name] on 05/03/2020 13:32:20 -------------------------------------------------------------------------------- Encounter Discharge Information Details Patient Name: Date of Service: [name], [name] 05/03/2020 10:30 A M Medical Record Number: [id number] Patient Account Number: [id number] Date of Birth/Sex: Treating RN: 01/22/1923 (86 y.o. Terri Acosta Primary Care Conan Mcmanaway: MA ST, MA N Other Clinician: Referring Levina Boyack: Treating Faithann Natal/Extender: Linton Ham MA ST, MA N Weeks in  Treatment: 5 Encounter Discharge Information Items Discharge Condition: Stable Ambulatory Status: Wheelchair Discharge Destination: Skilled Nursing Facility Telephoned: No Orders Sent: Yes Transportation: Private Auto Accompanied By: self Schedule Follow-up Appointment: Yes Clinical Summary of Care: Electronic Signature(s) Signed: 05/03/2020 5:15:56 PM By: Deon Pilling Entered By: Deon Pilling on 05/03/2020 12:42:05 -------------------------------------------------------------------------------- Lower Extremity Assessment Details Patient Name: Date of Service: [name], [name] 05/03/2020 10:30 A M Medical Record Number: [id number] Patient Account Number: [id number] Date of Birth/Sex: Treating RN: 1923-01-17 (86 y.o. Clearnce Sorrel Primary Care Terryl Niziolek: MA ST, MA N Other Clinician: Referring Musab Wingard: Treating Etrulia Zarr/Extender: Linton Ham MA ST, MA N Weeks in Treatment: 5 Edema Assessment Assessed: [Left: No] [Right: No] Edema: [Left: Ye] [Right: s] Calf Left: Right: Point of Measurement: 38 cm From Medial Instep 30.4 cm 30.5 cm Ankle Left: Right: Point of Measurement: 11 cm From Medial Instep 19.5 cm 22 cm Vascular Assessment Pulses: Dorsalis Pedis Palpable: [Left:Yes] [Right:Yes] Electronic Signature(s) Signed: 05/03/2020 6:04:50 PM By: Kela Millin Entered By: Kela Millin on 05/03/2020 11:54:13 -------------------------------------------------------------------------------- Multi Wound Chart Details Patient Name: Date of Service: [name], [name] 05/03/2020 10:30 A M Medical Record Number: [id number] Patient Account Number: [id number] Date of Birth/Sex: Treating RN: 08/19/1923 (86 y.o. F) Primary Care Jontae Adebayo: MA ST, MA N Other Clinician: Referring Tatumn Corbridge: Treating Laurinda Carreno/Extender: Linton Ham MA ST, MA N Weeks in Treatment: 5 Vital Signs Height(in): 64 Pulse(bpm): 97 Weight(lbs): 90 Blood Pressure(mmHg): 128/85 Body Mass  Index(BMI): 15 Temperature(F): 98.1 Respiratory Rate(breaths/min): 18 Photos: [2:No Photos Left, Distal, Lateral Lower Leg] [3:No Photos Left, Posterior Lower Leg] [4:No Photos Sacrum] Wound Location: [2:Trauma] [3:Gradually Appeared] [4:Gradually Appeared] Wounding Event: [2:Skin Tear] [3:Skin Tear] [4:Pressure Ulcer] Primary Etiology: [2:Glaucoma, Hypertension] [3:Glaucoma, Hypertension] [4:Glaucoma, Hypertension] Comorbid  History: [2:03/16/2020] [3:03/16/2020] [4:03/29/2020] Date Acquired: [2:5] [3:5] [4:5] Weeks of Treatment: [2:Healed - Epithelialized] [3:Open] [4:Open] Wound Status: [2:0x0x0] [3:0.7x0.6x0.1] [4:1.5x1.5x1.7] Measurements L x W x D (cm) [2:0] [3:0.33] [4:1.767] A (cm) : rea [2:0] [3:0.033] [4:3.004] Volume (cm) : [2:100.00%] [3:96.90%] [4:70.00%] % Reduction in A rea: [2:100.00%] [3:96.90%] [4:71.70%] % Reduction in Volume: [4:12] Starting Position 1 (o'clock): [4:4] Ending Position 1 (o'clock): [4:1.6] Maximum Distance 1 (cm): [2:No] [3:No] [4:Yes] Undermining: [2:Full Thickness Without Exposed] [3:Full Thickness Without Exposed] [4:Category/Stage IV] Classification: [2:Support Structures None Present] [3:Support Structures Medium] [4:Medium] Exudate Amount: [2:N/A] [3:Serosanguineous] [4:Serosanguineous] Exudate Type: [2:N/A] [3:red, brown] [4:red, brown] Exudate Color: [2:Distinct, outline attached] [3:Flat and Intact] [4:Well defined, not attached] Wound Margin: [2:None Present (0%)] [3:Large (67-100%)] [4:Large (67-100%)] Granulation Amount: [2:N/A] [3:Red] [4:Pink] Granulation Quality: [2:None Present (0%)] [3:None Present (0%)] [4:Small (1-33%)] Necrotic Amount: [2:Fascia: No] [3:Fat Layer (Subcutaneous Tissue): Yes Fat Layer (Subcutaneous Tissue): Yes] Exposed Structures: [2:Fat Layer (Subcutaneous Tissue): No Tendon: No Muscle: No Joint: No Bone: No None] [3:Fascia: No Tendon: No Muscle: No Joint: No Bone: No Medium (34-66%)] [4:Fascia: No Tendon: No  Muscle: No Joint: No Bone: No None] Wound Number: '5 6 7 ' Photos: No Photos No Photos No Photos Left, Anterior Lower Leg Left, Proximal, Lateral Lower Leg Right, Lateral Lower Leg Wound Location: Gradually Appeared Gradually Appeared Shear/Friction Wounding Event: Venous Leg Ulcer Venous Leg Ulcer Skin Tear Primary Etiology: Glaucoma, Hypertension Glaucoma, Hypertension Glaucoma, Hypertension Comorbid History: 04/19/2020 04/19/2020 05/03/2020 Date Acquired: 2 2 0 Weeks of Treatment: Open Healed - Epithelialized Open Wound Status: 1x0.7x0.2 0x0x0 1.2x1x0.2 Measurements L x W x D (cm) 0.55 0 0.942 A (cm) : rea 0.11 0 0.188 Volume (cm) : 63.50% 100.00% 0.00% % Reduction in Area: 27.20% 100.00% 0.00% % Reduction in Volume: No No No Undermining: Full Thickness Without Exposed Full Thickness Without Exposed Full Thickness Without Exposed Classification: Support Structures Support Structures Support Structures Small None Present Medium Exudate Amount: Serosanguineous N/A Serosanguineous Exudate Type: red, brown N/A red, brown Exudate Color: Flat and Intact Distinct, outline attached Distinct, outline attached Wound Margin: Medium (34-66%) None Present (0%) Large (67-100%) Granulation Amount: Pink N/A Red, Pink Granulation Quality: Medium (34-66%) None Present (0%) None Present (0%) Necrotic Amount: Fat Layer (Subcutaneous Tissue): Yes Fascia: No Fat Layer (Subcutaneous Tissue): Yes Exposed Structures: Fascia: No Fat Layer (Subcutaneous Tissue): No Fascia: No Tendon: No Tendon: No Tendon: No Muscle: No Muscle: No Muscle: No Joint: No Joint: No Joint: No Bone: No Bone: No Bone: No Small (1-33%) Large (67-100%) None Epithelialization: Treatment Notes Wound #3 (Left, Posterior Lower Leg) 1. Cleanse With Wound Cleanser Soap and water 2. Periwound Care Moisturizing lotion 3. Primary Dressing Applied Calcium Alginate Ag 4. Secondary Dressing Dry Gauze 6.  Support Layer Applied Kerlix/Coban Notes kerlix and coban applied lightly. Wound #4 (Sacrum) 1. Cleanse With Wound Cleanser 2. Periwound Care Skin Prep 3. Primary Dressing Applied Collegen AG Other primary dressing (specifiy in notes) 4. Secondary Dressing Foam Border Dressing Notes saline moistened gauze packing Wound #5 (Left, Anterior Lower Leg) 1. Cleanse With Wound Cleanser Soap and water 2. Periwound Care Moisturizing lotion 3. Primary Dressing Applied Calcium Alginate Ag 4. Secondary Dressing Dry Gauze 6. Support Layer Applied Kerlix/Coban Notes kerlix and coban applied lightly. Wound #7 (Right, Lateral Lower Leg) 1. Cleanse With Wound Cleanser Soap and water 2. Periwound Care Moisturizing lotion 3. Primary Dressing Applied Calcium Alginate Ag 4. Secondary Dressing Dry Gauze 6. Support Layer Applied Kerlix/Coban Notes kerlix and coban applied lightly. Electronic  Signature(s) Signed: 05/04/2020 5:12:45 PM By: Linton Ham MD Entered By: Linton Ham on 05/03/2020 13:21:09 -------------------------------------------------------------------------------- Multi-Disciplinary Care Plan Details Patient Name: Date of Service: [name], [name] 05/03/2020 10:30 A M Medical Record Number: [id number] Patient Account Number: [id number] Date of Birth/Sex: Treating RN: 06-21-1923 (86 y.o. Terri Acosta Primary Care Jeramyah Goodpasture: MA ST, MA N Other Clinician: Referring Alanzo Lamb: Treating Keeyon Privitera/Extender: Linton Ham MA ST, MA N Weeks in Treatment: 5 Active Inactive Pressure Nursing Diagnoses: Knowledge deficit related to causes and risk factors for pressure ulcer development Knowledge deficit related to management of pressures ulcers Potential for impaired tissue integrity related to pressure, friction, moisture, and shear Goals: Patient/caregiver will verbalize risk factors for pressure ulcer development Date Initiated: 03/29/2020 Target Resolution  Date: 06/04/2020 Goal Status: Active Patient/caregiver will verbalize understanding of pressure ulcer management Date Initiated: 03/29/2020 Date Inactivated: 05/03/2020 Target Resolution Date: 04/30/2020 Goal Status: Met Interventions: Assess: immobility, friction, shearing, incontinence upon admission and as needed Assess offloading mechanisms upon admission and as needed Assess potential for pressure ulcer upon admission and as needed Provide education on pressure ulcers Notes: Wound/Skin Impairment Nursing Diagnoses: Impaired tissue integrity Knowledge deficit related to ulceration/compromised skin integrity Goals: Patient/caregiver will verbalize understanding of skin care regimen Date Initiated: 03/29/2020 Target Resolution Date: 06/04/2020 Goal Status: Active Interventions: Assess patient/caregiver ability to obtain necessary supplies Assess patient/caregiver ability to perform ulcer/skin care regimen upon admission and as needed Assess ulceration(s) every visit Provide education on ulcer and skin care Notes: Electronic Signature(s) Signed: 05/05/2020 5:57:30 PM By: [name] RN, BSN Entered By: [name] on 05/03/2020 13:22:32 -------------------------------------------------------------------------------- Pain Assessment Details Patient Name: Date of Service: [name], [name] 05/03/2020 10:30 A M Medical Record Number: [id number] Patient Account Number: [id number] Date of Birth/Sex: Treating RN: [birth year] (86 y.o. F) Primary Care Naleigha Raimondi: MA ST, MA N Other Clinician: Referring Matvey Llanas: Treating Jamesmichael Shadd/Extender: Linton Ham MA ST, MA N Weeks in Treatment: 5 Active Problems Location of Pain Severity and Description of Pain Patient Has Paino No Site Locations Pain Management and Medication Current Pain Management: Electronic Signature(s) Signed: 05/04/2020 10:35:16 AM By: Sandre Kitty Entered By: Sandre Kitty on 05/03/2020  11:40:28 -------------------------------------------------------------------------------- Patient/Caregiver Education Details Patient Name: Date of Service: HA NSO Delane Ginger, [name] 9/13/2021andnbsp10:30 A M Medical Record Number: [id number] Patient Account Number: [id number] Date of Birth/Gender: Treating RN: Nov 11, 1922 (86 y.o. Terri Acosta Primary Care Physician: MA ST, MA N Other Clinician: Referring Physician: Treating Physician/Extender: Linton Ham MA ST, MA N Weeks in Treatment: 5 Education Assessment Education Provided To: Patient Education Topics Provided Wound/Skin Impairment: Methods: Explain/Verbal Responses: State content correctly Electronic Signature(s) Signed: 05/05/2020 5:57:30 PM By: [name] RN, BSN Entered By: [name] on 05/03/2020 11:43:35 -------------------------------------------------------------------------------- Wound Assessment Details Patient Name: Date of Service: [name], [name] 05/03/2020 10:30 A M Medical Record Number: [id number] Patient Account Number: [id number] Date of Birth/Sex: Treating RN: [birth year] (86 y.o. Clearnce Sorrel Primary Care Alek Borges: MA ST, MA N Other Clinician: Referring Joseandres Mazer: Treating Jaquaya Coyle/Extender: Linton Ham MA ST, MA N Weeks in Treatment: 5 Wound Status Wound Number: 2 Primary Etiology: Skin Tear Wound Location: Left, Distal, Lateral Lower Leg Wound Status: Healed - Epithelialized Wounding Event: Trauma Comorbid History: Glaucoma, Hypertension Date Acquired: 03/16/2020 Weeks Of Treatment: 5 Clustered Wound: No Photos Photo Uploaded By: Mikeal Hawthorne on 05/05/2020 11:38:54 Wound Measurements Length: (cm) Width: (cm) Depth: (cm) Area: (cm) Volume: (cm) 0 % Reduction in Area: 100% 0 % Reduction in  Volume: 100% 0 Epithelialization: None 0 Tunneling: No 0 Undermining: No Wound Description Classification: Full Thickness Without Exposed Support Structu Wound Margin:  Distinct, outline attached Exudate Amount: None Present res Foul Odor After Cleansing: No Slough/Fibrino No Wound Bed Granulation Amount: None Present (0%) Exposed Structure Necrotic Amount: None Present (0%) Fascia Exposed: No Fat Layer (Subcutaneous Tissue) Exposed: No Tendon Exposed: No Muscle Exposed: No Joint Exposed: No Bone Exposed: No Electronic Signature(s) Signed: 05/03/2020 6:04:50 PM By: Kela Millin Entered By: Kela Millin on 05/03/2020 11:55:06 -------------------------------------------------------------------------------- Wound Assessment Details Patient Name: Date of Service: [name], [name] 05/03/2020 10:30 A M Medical Record Number: [id number] Patient Account Number: [id number] Date of Birth/Sex: Treating RN: 07/31/1923 (86 y.o. F) Dwiggins, Larene Beach Primary Care Evalisse Prajapati: MA ST, MA N Other Clinician: Referring Dyana Magner: Treating Kristia Jupiter/Extender: Linton Ham MA ST, MA N Weeks in Treatment: 5 Wound Status Wound Number: 3 Primary Etiology: Skin Tear Wound Location: Left, Posterior Lower Leg Wound Status: Open Wounding Event: Gradually Appeared Comorbid History: Glaucoma, Hypertension Date Acquired: 03/16/2020 Weeks Of Treatment: 5 Clustered Wound: No Photos Photo Uploaded By: Mikeal Hawthorne on 05/05/2020 11:39:06 Wound Measurements Length: (cm) 0.7 Width: (cm) 0.6 Depth: (cm) 0.1 Area: (cm) 0.33 Volume: (cm) 0.033 % Reduction in Area: 96.9% % Reduction in Volume: 96.9% Epithelialization: Medium (34-66%) Tunneling: No Undermining: No Wound Description Classification: Full Thickness Without Exposed Support Structu Wound Margin: Flat and Intact Exudate Amount: Medium Exudate Type: Serosanguineous Exudate Color: red, brown res Foul Odor After Cleansing: No Slough/Fibrino No Wound Bed Granulation Amount: Large (67-100%) Exposed Structure Granulation Quality: Red Fascia Exposed: No Necrotic Amount: None Present (0%) Fat  Layer (Subcutaneous Tissue) Exposed: Yes Tendon Exposed: No Muscle Exposed: No Joint Exposed: No Bone Exposed: No Treatment Notes Wound #3 (Left, Posterior Lower Leg) 1. Cleanse With Wound Cleanser Soap and water 2. Periwound Care Moisturizing lotion 3. Primary Dressing Applied Calcium Alginate Ag 4. Secondary Dressing Dry Gauze 6. Support Layer Applied Kerlix/Coban Notes kerlix and coban applied lightly. Electronic Signature(s) Signed: 05/03/2020 6:04:50 PM By: Kela Millin Entered By: Kela Millin on 05/03/2020 11:56:11 -------------------------------------------------------------------------------- Wound Assessment Details Patient Name: Date of Service: [name], [name] 05/03/2020 10:30 A M Medical Record Number: [id number] Patient Account Number: [id number] Date of Birth/Sex: Treating RN: 1922-12-11 (86 y.o. F) Dwiggins, Larene Beach Primary Care Louvina Cleary: MA ST, MA N Other Clinician: Referring Roniel Halloran: Treating Garvin Ellena/Extender: Linton Ham MA ST, MA N Weeks in Treatment: 5 Wound Status Wound Number: 4 Primary Etiology: Pressure Ulcer Wound Location: Sacrum Wound Status: Open Wounding Event: Gradually Appeared Comorbid History: Glaucoma, Hypertension Date Acquired: 03/29/2020 Weeks Of Treatment: 5 Clustered Wound: No Photos Photo Uploaded By: Mikeal Hawthorne on 05/05/2020 11:39:23 Wound Measurements Length: (cm) 1.5 Width: (cm) 1.5 Depth: (cm) 1.7 Area: (cm) 1.767 Volume: (cm) 3.004 % Reduction in Area: 70% % Reduction in Volume: 71.7% Epithelialization: None Tunneling: No Undermining: Yes Starting Position (o'clock): 12 Ending Position (o'clock): 4 Maximum Distance: (cm) 1.6 Wound Description Classification: Category/Stage IV Wound Margin: Well defined, not attached Exudate Amount: Medium Exudate Type: Serosanguineous Exudate Color: red, brown Foul Odor After Cleansing: No Slough/Fibrino Yes Wound Bed Granulation Amount: Large  (67-100%) Exposed Structure Granulation Quality: Pink Fascia Exposed: No Necrotic Amount: Small (1-33%) Fat Layer (Subcutaneous Tissue) Exposed: Yes Necrotic Quality: Adherent Slough Tendon Exposed: No Muscle Exposed: No Joint Exposed: No Bone Exposed: No Treatment Notes Wound #4 (Sacrum) 1. Cleanse With Wound Cleanser 2. Periwound Care Skin Prep 3. Primary Dressing Applied Collegen AG Other primary dressing (  specifiy in notes) 4. Secondary Dressing Foam Border Dressing Notes saline moistened gauze packing Electronic Signature(s) Signed: 05/03/2020 6:04:50 PM By: Kela Millin Entered By: Kela Millin on 05/03/2020 11:57:29 -------------------------------------------------------------------------------- Wound Assessment Details Patient Name: Date of Service: [name], [name] 05/03/2020 10:30 A M Medical Record Number: [id number] Patient Account Number: [id number] Date of Birth/Sex: Treating RN: 1922/12/01 (86 y.o. F) Dwiggins, Larene Beach Primary Care Mazzy Santarelli: MA ST, MA N Other Clinician: Referring Mansel Strother: Treating Ronae Noell/Extender: Linton Ham MA ST, MA N Weeks in Treatment: 5 Wound Status Wound Number: 5 Primary Etiology: Venous Leg Ulcer Wound Location: Left, Anterior Lower Leg Wound Status: Open Wounding Event: Gradually Appeared Comorbid History: Glaucoma, Hypertension Date Acquired: 04/19/2020 Weeks Of Treatment: 2 Clustered Wound: No Photos Photo Uploaded By: Mikeal Hawthorne on 05/05/2020 11:38:17 Wound Measurements Length: (cm) 1 Width: (cm) 0.7 Depth: (cm) 0.2 Area: (cm) 0.55 Volume: (cm) 0.11 % Reduction in Area: 63.5% % Reduction in Volume: 27.2% Epithelialization: Small (1-33%) Tunneling: No Undermining: No Wound Description Classification: Full Thickness Without Exposed Support Structures Wound Margin: Flat and Intact Exudate Amount: Small Exudate Type: Serosanguineous Exudate Color: red, brown Foul Odor After Cleansing:  No Slough/Fibrino Yes Wound Bed Granulation Amount: Medium (34-66%) Exposed Structure Granulation Quality: Pink Fascia Exposed: No Necrotic Amount: Medium (34-66%) Fat Layer (Subcutaneous Tissue) Exposed: Yes Necrotic Quality: Adherent Slough Tendon Exposed: No Muscle Exposed: No Joint Exposed: No Bone Exposed: No Treatment Notes Wound #5 (Left, Anterior Lower Leg) 1. Cleanse With Wound Cleanser Soap and water 2. Periwound Care Moisturizing lotion 3. Primary Dressing Applied Calcium Alginate Ag 4. Secondary Dressing Dry Gauze 6. Support Layer Applied Kerlix/Coban Notes kerlix and coban applied lightly. Electronic Signature(s) Signed: 05/03/2020 6:04:50 PM By: Kela Millin Entered By: Kela Millin on 05/03/2020 11:58:02 -------------------------------------------------------------------------------- Wound Assessment Details Patient Name: Date of Service: [name], [name] 05/03/2020 10:30 A M Medical Record Number: [id number] Patient Account Number: [id number] Date of Birth/Sex: Treating RN: Jul 14, 1923 (86 y.o. F) Dwiggins, Larene Beach Primary Care Calla Wedekind: MA ST, MA N Other Clinician: Referring Zoeann Mol: Treating Deavion Dobbs/Extender: Linton Ham MA ST, MA N Weeks in Treatment: 5 Wound Status Wound Number: 6 Primary Etiology: Venous Leg Ulcer Wound Location: Left, Proximal, Lateral Lower Leg Wound Status: Healed - Epithelialized Wounding Event: Gradually Appeared Comorbid History: Glaucoma, Hypertension Date Acquired: 04/19/2020 Weeks Of Treatment: 2 Clustered Wound: No Photos Photo Uploaded By: Mikeal Hawthorne on 05/05/2020 11:38:39 Wound Measurements Length: (cm) Width: (cm) Depth: (cm) Area: (cm) Volume: (cm) 0 % Reduction in Area: 100% 0 % Reduction in Volume: 100% 0 Epithelialization: Large (67-100%) 0 Tunneling: No 0 Undermining: No Wound Description Classification: Full Thickness Without Exposed Support Structures Wound Margin:  Distinct, outline attached Exudate Amount: None Present Foul Odor After Cleansing: No Slough/Fibrino No Wound Bed Granulation Amount: None Present (0%) Exposed Structure Necrotic Amount: None Present (0%) Fascia Exposed: No Fat Layer (Subcutaneous Tissue) Exposed: No Tendon Exposed: No Muscle Exposed: No Joint Exposed: No Bone Exposed: No Electronic Signature(s) Signed: 05/03/2020 6:04:50 PM By: Kela Millin Entered By: Kela Millin on 05/03/2020 11:55:37 -------------------------------------------------------------------------------- Wound Assessment Details Patient Name: Date of Service: [name], [name] 05/03/2020 10:30 A M Medical Record Number: [id number] Patient Account Number: [id number] Date of Birth/Sex: Treating RN: 1923-07-06 (86 y.o. Clearnce Sorrel Primary Care Starlet Gallentine: MA ST, MA N Other Clinician: Referring Sussie Minor: Treating Jousha Schwandt/Extender: Linton Ham MA ST, MA N Weeks in Treatment: 5 Wound Status Wound Number: 7 Primary Etiology: Skin Tear Wound Location: Right, Lateral Lower Leg  Wound Status: Open Wounding Event: Shear/Friction Comorbid History: Glaucoma, Hypertension Date Acquired: 05/03/2020 Weeks Of Treatment: 0 Clustered Wound: No Photos Photo Uploaded By: Mikeal Hawthorne on 05/05/2020 11:38:18 Wound Measurements Length: (cm) 1.2 Width: (cm) 1 Depth: (cm) 0.2 Area: (cm) 0.942 Volume: (cm) 0.188 % Reduction in Area: 0% % Reduction in Volume: 0% Epithelialization: None Tunneling: No Undermining: No Wound Description Classification: Full Thickness Without Exposed Support Stru Wound Margin: Distinct, outline attached Exudate Amount: Medium Exudate Type: Serosanguineous Exudate Color: red, brown ctures Foul Odor After Cleansing: No Slough/Fibrino No Wound Bed Granulation Amount: Large (67-100%) Exposed Structure Granulation Quality: Red, Pink Fascia Exposed: No Necrotic Amount: None Present (0%) Fat Layer  (Subcutaneous Tissue) Exposed: Yes Tendon Exposed: No Muscle Exposed: No Joint Exposed: No Bone Exposed: No Treatment Notes Wound #7 (Right, Lateral Lower Leg) 1. Cleanse With Wound Cleanser Soap and water 2. Periwound Care Moisturizing lotion 3. Primary Dressing Applied Calcium Alginate Ag 4. Secondary Dressing Dry Gauze 6. Support Layer Applied Kerlix/Coban Notes kerlix and coban applied lightly. Electronic Signature(s) Signed: 05/03/2020 6:04:50 PM By: Kela Millin Entered By: Kela Millin on 05/03/2020 11:58:38 -------------------------------------------------------------------------------- Vitals Details Patient Name: Date of Service: [name], [name] 05/03/2020 10:30 A M Medical Record Number: [id number] Patient Account Number: [id number] Date of Birth/Sex: Treating RN: 30-Oct-1922 (86 y.o. F) Primary Care Lieutenant Abarca: MA ST, MA N Other Clinician: Referring Lipa Knauff: Treating Adamariz Gillott/Extender: Linton Ham MA ST, MA N Weeks in Treatment: 5 Vital Signs Time Taken: 11:40 Temperature (F): 98.1 Height (in): 64 Pulse (bpm): 97 Weight (lbs): 90 Respiratory Rate (breaths/min): 18 Body Mass Index (BMI): 15.4 Blood Pressure (mmHg): 128/85 Reference Range: 80 - 120 mg / dl Electronic Signature(s) Signed: 05/04/2020 10:35:16 AM By: Sandre Kitty Entered By: Sandre Kitty on 05/03/2020 11:40:20

## 2020-05-05 NOTE — Progress Notes (Signed)
Terri Acosta, Terri Acosta ([id number]) Visit Report for 05/03/2020 HPI Details Patient Name: Date of Service: [name], [name] IS A. 05/03/2020 10:30 A M Medical Record Number: [id number] Patient Account Number: [id number] Date of Birth/Sex: Treating RN: 23-Nov-1922 (86 y.o. F) Primary Care Provider: MA ST, MA N Other Clinician: Referring Provider: Treating Provider/Extender: Linton Ham MA ST, MA N Weeks in Treatment: 5 History of Present Illness HPI Description: 86 year old female who was admitted in late May and discharged early June for suspected infectious diarrhea and after undergoing inpatient management with hydration, IV antibiotics, she was released to skilled nursing facility for recovery, during this time she developed a sacral wound that was classified as stage III for which they have been using Santyl and gauze packing with saline with daily changes. She also developed left leg skin tears and then shallow wounds that were addressed with silver alginate dressings. Patient originally is from assisted living and owing to her acute illness in June was transition to skilled nursing and the intention is for her to go back to assisted living once her recovery is complete. Patient denies any significant pain or discomfort in the sacrum or the leg areas. Intake and appetite have been good, denies any diarrhea or constipation. She denies any fevers or chills. Patient has atrial fibrillation with irregular heart rhythm with frequent episodes of tachycardia in the 100-120 range, she also runs borderline low blood pressures. Other medical problems includes chronic anemia latest hemoglobin 8.3 she is on iron supplements, mild anasarca with cirrhosis identified per CT scan of the abdomen, infrarenal AAA with partial clot 3.5 cm size. ABI in the clinic was difficult to obtain owing to edema in the leg, marked at 0.62, note that CT scan performed in the hospital showed significant atherosclerotic disease in  the major arteries 8/16; this is a patient who is at the skilled unit of [facility] although she was in the independent up until May. She was hospitalized and developed a pressure sore on her sacrum. Apparently she also had some abrasions to her left lower leg which was traumatic although I know none of the details. She was noted in this clinic to probably have PAD although this is not been aggressively investigated. We did ask for a x-ray of the lower sacrum according to the patient who seems cognitively intact that has not been done 8/30; 2-week follow-up. [facility] [place]. Pressure sore on her sacrum not much change today although the surface looks somewhat better with the Santyl. We are not doing well with the left leg, part of this is no doubt inadequate compression wrapping a friend's home which is sometimes just over the wound area. She has 2 new areas on the lateral part of the left leg the original wound on the medial part. We finally did get the x-ray of the lower sacrum which was done at the facility that was negative 9/13; 2-week follow-up. Apparently a friend's home cannot handle a wound VAC, which are case manager tried to call them. We are using silver collagen to this area. She has an area also on the left leg but a new area today on the right. Electronic Signature(s) Signed: 05/04/2020 5:12:45 PM By: Linton Ham MD Entered By: Linton Ham on 05/03/2020 13:21:49 -------------------------------------------------------------------------------- Physical Exam Details Patient Name: Date of Service: [name], [name] 05/03/2020 10:30 A M Medical Record Number: [id number] Patient Account Number: [id number] Date of Birth/Sex: Treating RN:  08-19-1923 (86 y.o. F) Primary Care Provider: MA ST, MA N Other Clinician: Referring Provider: Treating Provider/Extender: Linton Ham MA ST, MA N Weeks in Treatment: 5 Constitutional Sitting or  standing Blood Pressure is within target range for patient.. Pulse regular and within target range for patient.[name] Respirations regular, non-labored and within target range.. Temperature is normal and within the target range for the patient.[name] Appears in no distress. Cardiovascular Dorsalis pedis pulses palpable. 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 The tissue does not look unhealthy. There is no surrounding infection no crepitus Small superficial area on the left anterior lower leg looks better It looks as though they inadvertently remove the hyperkeratotic area from the right mid tibial area. She has several of these and these may be skin cancer related/squamous cell. She tells me that she saw a dermatologist last week for a wound on her right orbital area. They did not look at her legs. Electronic Signature(s) Signed: 05/04/2020 5:12:45 PM By: Linton Ham MD Entered By: Linton Ham on 05/03/2020 13:23:12 -------------------------------------------------------------------------------- Physician Orders Details Patient Name: Date of Service: [name], [name] 05/03/2020 10:30 A M Medical Record Number: [id number] Patient Account Number: [id number] Date of Birth/Sex: Treating RN: 1923-07-22 (86 y.o. [name] Primary Care Provider: MA ST, MA N Other Clinician: Referring Provider: Treating Provider/Extender: Linton Ham MA ST, MA N Weeks in Treatment: 5 Verbal / Phone Orders: No Diagnosis Coding ICD-10 Coding Code Description L97.221 Non-pressure chronic ulcer of left calf limited to breakdown of skin L89.154 Pressure ulcer of sacral region, stage 4 I48.11 Longstanding persistent atrial fibrillation I87.312 Chronic venous hypertension (idiopathic) with ulcer of left lower extremity Follow-up Appointments Return Appointment in 2 weeks. Dressing Change Frequency Change dressing every day. - all wounds Skin  Barriers/Peri-Wound Care Moisturizing lotion - both legs with each dressing change Wound Cleansing Clean wound with Normal Saline. - or wound cleanser Primary Wound Dressing Wound #3 Left,Posterior Lower Leg Calcium Alginate with Silver Wound #4 Sacrum Silver Collagen - apply saline moistened gauze over collagen Wound #5 Left,Anterior Lower Leg Calcium Alginate with Silver Wound #7 Right,Lateral Lower Leg Calcium Alginate with Silver Secondary Dressing Wound #4 Sacrum Foam Border Wound #3 Left,Posterior Lower Leg Dry Gauze Wound #5 Left,Anterior Lower Leg Dry Gauze Wound #7 Right,Lateral Lower Leg Dry Gauze Edema Control Kerlix and Coban - Bilateral - ***WRAP FROM BASE OF TOES TO JUST BELOW BEND OF KNEE*** Avoid standing for long periods of time Elevate legs to the level of the heart or above for 30 minutes daily and/or when sitting, a frequency of: - throughout the day Off-Loading Low air-loss mattress (Group 2) Roho cushion for wheelchair Turn and reposition every 2 hours Electronic Signature(s) Signed: 05/04/2020 5:12:45 PM By: Linton Ham MD Signed: 05/05/2020 5:57:30 PM By: [name] RN, BSN Entered By: [name] on 05/03/2020 12:32:39 -------------------------------------------------------------------------------- Problem List Details Patient Name: Date of Service: [name], [name] 05/03/2020 10:30 A M Medical Record Number: [id number] Patient Account Number: [id number] Date of Birth/Sex: Treating RN: Jan 02, 1923 (86 y.o. [name] Primary Care Provider: MA ST, MA N Other Clinician: Referring Provider: Treating Provider/Extender: Linton Ham MA ST, MA N Weeks in Treatment: 5 Active Problems ICD-10 Encounter Code Description Active Date MDM Diagnosis L97.221 Non-pressure chronic ulcer of left calf limited to breakdown of skin 03/29/2020 No Yes L89.154 Pressure ulcer of sacral region, stage 4 04/05/2020 No Yes I48.11 Longstanding  persistent atrial fibrillation 03/29/2020 No  Yes I87.312 Chronic venous hypertension (idiopathic) with ulcer of left lower extremity 04/19/2020 No Yes Inactive Problems Resolved Problems Electronic Signature(s) Signed: 05/04/2020 5:12:45 PM By: Linton Ham MD Entered By: Linton Ham on 05/03/2020 13:21:01 -------------------------------------------------------------------------------- Progress Note Details Patient Name: Date of Service: [name], [name] 05/03/2020 10:30 A M Medical Record Number: [id number] Patient Account Number: [id number] Date of Birth/Sex: Treating RN: 1922-10-16 (86 y.o. F) Primary Care Provider: MA ST, MA N Other Clinician: Referring Provider: Treating Provider/Extender: Linton Ham MA ST, MA N Weeks in Treatment: 5 Subjective History of Present Illness (HPI) 86 year old female who was admitted in late May and discharged early June for suspected infectious diarrhea and after undergoing inpatient management with hydration, IV antibiotics, she was released to skilled nursing facility for recovery, during this time she developed a sacral wound that was classified as stage III for which they have been using Santyl and gauze packing with saline with daily changes. She also developed left leg skin tears and then shallow wounds that were addressed with silver alginate dressings. Patient originally is from assisted living and owing to her acute illness in June was transition to skilled nursing and the intention is for her to go back to assisted living once her recovery is complete. Patient denies any significant pain or discomfort in the sacrum or the leg areas. Intake and appetite have been good, denies any diarrhea or constipation. She denies any fevers or chills. Patient has atrial fibrillation with irregular heart rhythm with frequent episodes of tachycardia in the 100-120 range, she also runs borderline low blood pressures. Other medical problems includes  chronic anemia latest hemoglobin 8.3 she is on iron supplements, mild anasarca with cirrhosis identified per CT scan of the abdomen, infrarenal AAA with partial clot 3.5 cm size. ABI in the clinic was difficult to obtain owing to edema in the leg, marked at 0.62, note that CT scan performed in the hospital showed significant atherosclerotic disease in the major arteries 8/16; this is a patient who is at the skilled unit of [facility] although she was in the independent up until May. She was hospitalized and developed a pressure sore on her sacrum. Apparently she also had some abrasions to her left lower leg which was traumatic although I know none of the details. She was noted in this clinic to probably have PAD although this is not been aggressively investigated. We did ask for a x-ray of the lower sacrum according to the patient who seems cognitively intact that has not been done 8/30; 2-week follow-up. She is [facility] [place]. Pressure sore on her sacrum not much change today although the surface looks somewhat better with the Santyl. We are not doing well with the left leg, part of this is no doubt inadequate compression wrapping a friend's home which is sometimes just over the wound area. She has 2 new areas on the lateral part of the left leg the original wound on the medial part. We finally did get the x-ray of the lower sacrum which was done at the facility that was negative 9/13; 2-week follow-up. Apparently a friend's home cannot handle a wound VAC, which are case manager tried to call them. We are using silver collagen to this area. She has an area also on the left leg but a new area today on the right. Objective Constitutional Sitting or standing Blood Pressure is within target range for patient.. Pulse regular and within target range  for patient.[name] Respirations regular, non-labored and within target range.. Temperature is normal and within the target  range for the patient.[name] Appears in no distress. Vitals Time Taken: 11:40 AM, Height: 64 in, Weight: 90 lbs, BMI: 15.4, Temperature: 98.1 F, Pulse: 97 bpm, Respiratory Rate: 18 breaths/min, Blood Pressure: 128/85 mmHg. Cardiovascular Dorsalis pedis pulses palpable. 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 The tissue does not look unhealthy. There is no surrounding infection no crepitus ooSmall superficial area on the left anterior lower leg looks better ooIt looks as though they inadvertently remove the hyperkeratotic area from the right mid tibial area. She has several of these and these may be skin cancer related/squamous cell. She tells me that she saw a dermatologist last week for a wound on her right orbital area. They did not look at her legs. Integumentary (Hair, Skin) Wound #2 status is Healed - Epithelialized. Original cause of wound was Trauma. The wound is located on the Left,Distal,Lateral Lower Leg. The wound measures 0cm length x 0cm width x 0cm depth; 0cm^2 area and 0cm^3 volume. There is no tunneling or undermining noted. There is a none present amount of drainage noted. The wound margin is distinct with the outline attached to the wound base. There is no granulation within the wound bed. There is no necrotic tissue within the wound bed. Wound #3 status is Open. Original cause of wound was Gradually Appeared. The wound is located on the Left,Posterior Lower Leg. The wound measures 0.7cm length x 0.6cm width x 0.1cm depth; 0.33cm^2 area and 0.033cm^3 volume. There is Fat Layer (Subcutaneous Tissue) exposed. There is no tunneling or undermining noted. There is a medium amount of serosanguineous drainage noted. The wound margin is flat and intact. There is large (67-100%) red granulation within the wound bed. There is no necrotic tissue within the wound bed. Wound #4 status is Open. Original cause of wound was Gradually Appeared.  The wound is located on the Sacrum. The wound measures 1.5cm length x 1.5cm width x 1.7cm depth; 1.767cm^2 area and 3.004cm^3 volume. There is Fat Layer (Subcutaneous Tissue) exposed. There is no tunneling noted, however, there is undermining starting at 12:00 and ending at 4:00 with a maximum distance of 1.6cm. There is a medium amount of serosanguineous drainage noted. The wound margin is well defined and not attached to the wound base. There is large (67-100%) pink granulation within the wound bed. There is a small (1-33%) amount of necrotic tissue within the wound bed including Adherent Slough. Wound #5 status is Open. Original cause of wound was Gradually Appeared. The wound is located on the Left,Anterior Lower Leg. The wound measures 1cm length x 0.7cm width x 0.2cm depth; 0.55cm^2 area and 0.11cm^3 volume. There is Fat Layer (Subcutaneous Tissue) exposed. There is no tunneling or undermining noted. There is a small amount of serosanguineous drainage noted. The wound margin is flat and intact. There is medium (34-66%) pink granulation within the wound bed. There is a medium (34-66%) amount of necrotic tissue within the wound bed including Adherent Slough. Wound #6 status is Healed - Epithelialized. Original cause of wound was Gradually Appeared. The wound is located on the Left,Proximal,Lateral Lower Leg. The wound measures 0cm length x 0cm width x 0cm depth; 0cm^2 area and 0cm^3 volume. There is no tunneling or undermining noted. There is a none present amount of drainage noted. The wound margin is distinct with the outline attached to the wound base. There is no  granulation within the wound bed. There is no necrotic tissue within the wound bed. Wound #7 status is Open. Original cause of wound was Shear/Friction. The wound is located on the Right,Lateral Lower Leg. The wound measures 1.2cm length x 1cm width x 0.2cm depth; 0.942cm^2 area and 0.188cm^3 volume. There is Fat Layer (Subcutaneous  Tissue) exposed. There is no tunneling or undermining noted. There is a medium amount of serosanguineous drainage noted. The wound margin is distinct with the outline attached to the wound base. There is large (67-100%) red, pink granulation within the wound bed. There is no necrotic tissue within the wound bed. 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 Dressing Change Frequency: Change dressing every day. - all wounds Skin Barriers/Peri-Wound Care: Moisturizing lotion - both legs with each dressing change Wound Cleansing: Clean wound with Normal Saline. - or wound cleanser Primary Wound Dressing: Wound #3 Left,Posterior Lower Leg: Calcium Alginate with Silver Wound #4 Sacrum: Silver Collagen - apply saline moistened gauze over collagen Wound #5 Left,Anterior Lower Leg: Calcium Alginate with Silver Wound #7 Right,Lateral Lower Leg: Calcium Alginate with Silver Secondary Dressing: Wound #4 Sacrum: Foam Border Wound #3 Left,Posterior Lower Leg: Dry Gauze Wound #5 Left,Anterior Lower Leg: Dry Gauze Wound #7 Right,Lateral Lower Leg: Dry Gauze Edema Control: Kerlix and Coban - Bilateral - ***WRAP FROM BASE OF TOES TO JUST BELOW BEND OF KNEE*** Avoid standing for long periods of time Elevate legs to the level of the heart or above for 30 minutes daily and/or when sitting, a frequency of: - throughout the day Off-Loading: Low air-loss mattress (Group 2) Roho cushion for wheelchair Turn and reposition every 2 hours 1. I did not change the primary dressing to the stage IV coccyx ulcer which is silver collagen with backing wet-to-dry 2. We are also using silver collagen to the areas on the left anterior  leg and now right anterior leg 3. Is a lot of this area on her lower extremities are dry flaking skin with hyperkeratotic areas. I wonder whether some of this hyperkeratotic could be underlying squamous cell carcinoma. Electronic Signature(s) Signed: 05/04/2020 5:12:45 PM By: Linton Ham MD Entered By: Linton Ham on 05/03/2020 13:24:17 -------------------------------------------------------------------------------- SuperBill Details Patient Name: Date of Service: [name], [name] 05/03/2020 Medical Record Number: [id number] Patient Account Number: [id number] Date of Birth/Sex: Treating RN: [birth year] (86 y.o. F) Primary Care Provider: MA ST, MA N Other Clinician: Referring Provider: Treating Provider/Extender: Linton Ham MA ST, MA N Weeks in Treatment: 5 Diagnosis Coding ICD-10 Codes Code Description [id number] Non-pressure chronic ulcer of left calf limited to breakdown of skin L89.154 Pressure ulcer of sacral region, stage 4 I48.11 Longstanding persistent atrial fibrillation I87.312 Chronic venous hypertension (idiopathic) with ulcer of left lower extremity Facility Procedures CPT4 Code: [id number] Description: 36629 - WOUND CARE VISIT-LEV 5 EST PT Modifier: Quantity: 1 Physician Procedures : CPT4 Code Description Modifier [id number] - WC PHYS LEVEL 3 - EST PT ICD-10 Diagnosis Description L97.221 Non-pressure chronic ulcer of left calf limited to breakdown of skin L89.154 Pressure ulcer of sacral region, stage 4 Quantity: 1 Electronic Signature(s) Signed: 05/04/2020 5:12:45 PM By: Linton Ham MD Signed: 05/05/2020 5:57:30 PM By: [name] RN, BSN Entered By: [name] on 05/03/2020 13:32:48

## 2020-05-17 ENCOUNTER — Encounter (HOSPITAL_BASED_OUTPATIENT_CLINIC_OR_DEPARTMENT_OTHER): Payer: Medicare Other | Admitting: Internal Medicine

## 2020-05-17 DIAGNOSIS — L97221 Non-pressure chronic ulcer of left calf limited to breakdown of skin: Secondary | ICD-10-CM | POA: Diagnosis not present

## 2020-05-18 NOTE — Progress Notes (Signed)
SHARNETTE, KITAMURA ([id number]) Visit Report for 05/17/2020 Arrival Information Details Patient Name: Date of Service: [name], [name] IS A. 05/17/2020 10:30 A M Medical Record Number: [id number] Patient Account Number: [id number] Date of Birth/Sex: Treating RN: [birth year] (86 y.o. [name] Primary Care Bern Fare: MA ST, MA N Other Clinician: Referring Osmany Azer: Treating Shemia Bevel/Extender: Linton Ham MA ST, MA N Weeks in Treatment: 7 Visit Information History Since Last Visit Added or deleted any medications: No Patient Arrived: Wheel Chair Any new allergies or adverse reactions: No Arrival Time: 10:53 Had a fall or experienced change in No Accompanied By: driver activities of daily living that may affect Transfer Assistance: None risk of falls: Patient Identification Verified: Yes Signs or symptoms of abuse/neglect since last visito No Secondary Verification Process Completed: Yes Hospitalized since last visit: No Patient Requires Transmission-Based Precautions: No Implantable device outside of the clinic excluding No Patient Has Alerts: No cellular tissue based products placed in the center since last visit: Has Dressing in Place as Prescribed: Yes Pain Present Now: No Electronic Signature(s) Signed: 05/17/2020 1:08:31 PM By: Sandre Kitty Entered By: Sandre Kitty on 05/17/2020 10:53:20 -------------------------------------------------------------------------------- Clinic Level of Care Assessment Details Patient Name: Date of Service: [name], [name] 05/17/2020 10:30 A M Medical Record Number: [id number] Patient Account Number: [id number] Date of Birth/Sex: Treating RN: 05/06/1923 (86 y.o. [name] Primary Care Launa Goedken: MA ST, MA N Other Clinician: Referring Diana Armijo: Treating Jerlene Rockers/Extender: Linton Ham MA ST, MA N Weeks in Treatment: 7 Clinic Level of Care Assessment Items TOOL 4 Quantity Score X- 1 0 Use when only an EandM is  performed on FOLLOW-UP visit ASSESSMENTS - Nursing Assessment / Reassessment X- 1 10 Reassessment of Co-morbidities (includes updates in patient status) X- 1 5 Reassessment of Adherence to Treatment Plan ASSESSMENTS - Wound and Skin A ssessment / Reassessment '[]'  - 0 Simple Wound Assessment / Reassessment - one wound X- 5 5 Complex Wound Assessment / Reassessment - multiple wounds '[]'  - 0 Dermatologic / Skin Assessment (not related to wound area) ASSESSMENTS - Focused Assessment '[]'  - 0 Circumferential Edema Measurements - multi extremities '[]'  - 0 Nutritional Assessment / Counseling / Intervention X- 1 5 Lower Extremity Assessment (monofilament, tuning fork, pulses) '[]'  - 0 Peripheral Arterial Disease Assessment (using hand held doppler) ASSESSMENTS - Ostomy and/or Continence Assessment and Care '[]'  - 0 Incontinence Assessment and Management '[]'  - 0 Ostomy Care Assessment and Management (repouching, etc.) PROCESS - Coordination of Care X - Simple Patient / Family Education for ongoing care 1 15 '[]'  - 0 Complex (extensive) Patient / Family Education for ongoing care '[]'  - 0 Staff obtains Programmer, systems, Records, T Results / Process Orders est X- 1 10 Staff telephones HHA, Nursing Homes / Clarify orders / etc '[]'  - 0 Routine Transfer to another Facility (non-emergent condition) '[]'  - 0 Routine Hospital Admission (non-emergent condition) '[]'  - 0 New Admissions / Biomedical engineer / Ordering NPWT Apligraf, etc. , '[]'  - 0 Emergency Hospital Admission (emergent condition) X- 1 10 Simple Discharge Coordination '[]'  - 0 Complex (extensive) Discharge Coordination PROCESS - Special Needs '[]'  - 0 Pediatric / Minor Patient Management '[]'  - 0 Isolation Patient Management '[]'  - 0 Hearing / Language / Visual special needs '[]'  - 0 Assessment of Community assistance (transportation, D/C planning, etc.) '[]'  - 0 Additional assistance / Altered mentation '[]'  - 0 Support Surface(s) Assessment  (bed, cushion, seat, etc.) INTERVENTIONS - Wound Cleansing / Measurement '[]'  - 0 Simple Wound Cleansing -  one wound X- 5 5 Complex Wound Cleansing - multiple wounds X- 1 5 Wound Imaging (photographs - any number of wounds) '[]'  - 0 Wound Tracing (instead of photographs) '[]'  - 0 Simple Wound Measurement - one wound X- 5 5 Complex Wound Measurement - multiple wounds INTERVENTIONS - Wound Dressings '[]'  - 0 Small Wound Dressing one or multiple wounds '[]'  - 0 Medium Wound Dressing one or multiple wounds X- 2 20 Large Wound Dressing one or multiple wounds '[]'  - 0 Application of Medications - topical '[]'  - 0 Application of Medications - injection INTERVENTIONS - Miscellaneous '[]'  - 0 External ear exam '[]'  - 0 Specimen Collection (cultures, biopsies, blood, body fluids, etc.) '[]'  - 0 Specimen(s) / Culture(s) sent or taken to Lab for analysis '[]'  - 0 Patient Transfer (multiple staff / Civil Service fast streamer / Similar devices) '[]'  - 0 Simple Staple / Suture removal (25 or less) '[]'  - 0 Complex Staple / Suture removal (26 or more) '[]'  - 0 Hypo / Hyperglycemic Management (close monitor of Blood Glucose) '[]'  - 0 Ankle / Brachial Index (ABI) - do not check if billed separately X- 1 5 Vital Signs Has the patient been seen at the hospital within the last three years: Yes Total Score: 180 Level Of Care: New/Established - Level 5 Electronic Signature(s) Signed: 05/17/2020 5:31:38 PM By: [name] RN, BSN Entered By: [name] on 05/17/2020 14:46:24 -------------------------------------------------------------------------------- Encounter Discharge Information Details Patient Name: Date of Service: [name], [name] 05/17/2020 10:30 A M Medical Record Number: [id number] Patient Account Number: [id number] Date of Birth/Sex: Treating RN: 31-Jan-1923 (86 y.o. [name] Primary Care Amellia Panik: MA ST, MA N Other Clinician: Referring Talena Neira: Treating Zoee Heeney/Extender: Linton Ham MA ST, MA  N Weeks in Treatment: 7 Encounter Discharge Information Items Discharge Condition: Stable Ambulatory Status: Wheelchair Discharge Destination: Home Transportation: Private Auto Accompanied By: self Schedule Follow-up Appointment: Yes Clinical Summary of Care: Electronic Signature(s) Signed: 05/17/2020 5:26:25 PM By: Deon Pilling Entered By: Deon Pilling on 05/17/2020 12:21:45 -------------------------------------------------------------------------------- Lower Extremity Assessment Details Patient Name: Date of Service: [name], [name] 05/17/2020 10:30 A M Medical Record Number: [id number] Patient Account Number: [id number] Date of Birth/Sex: Treating RN: [birth year] (86 y.o. [name] Primary Care Monnica Saltsman: MA ST, MA N Other Clinician: Referring Kosei Rhodes: Treating Juliona Vales/Extender: Linton Ham MA ST, MA N Weeks in Treatment: 7 Edema Assessment Assessed: [Left: Yes] [Right: Yes] Edema: [Left: Yes] [Right: Yes] Calf Left: Right: Point of Measurement: 38 cm From Medial Instep 29 cm 33 cm Ankle Left: Right: Point of Measurement: 11 cm From Medial Instep 20 cm 21 cm Electronic Signature(s) Signed: 05/17/2020 5:26:25 PM By: Deon Pilling Entered By: Deon Pilling on 05/17/2020 11:30:22 -------------------------------------------------------------------------------- Multi Wound Chart Details Patient Name: Date of Service: [name], [name] 05/17/2020 10:30 A M Medical Record Number: [id number] Patient Account Number: [id number] Date of Birth/Sex: Treating RN: Dec 10, 1922 (86 y.o. [name] Primary Care Melat Wrisley: MA ST, MA N Other Clinician: Referring Kalya Troeger: Treating Kori Goins/Extender: Linton Ham MA ST, MA N Weeks in Treatment: 7 Vital Signs Height(in): 64 Pulse(bpm): 92 Weight(lbs): 90 Blood Pressure(mmHg): 106/74 Body Mass Index(BMI): 15 Temperature(F): 97.4 Respiratory Rate(breaths/min): 18 Photos: [3:No Photos Left, Posterior Lower  Leg] [4:No Photos Sacrum] [5:No Photos Left, Anterior Lower Leg] Wound Location: [3:Gradually Appeared] [4:Gradually Appeared] [5:Gradually Appeared] Wounding Event: [3:Skin Tear] [4:Pressure Ulcer] [5:Venous Leg Ulcer] Primary Etiology: [3:Glaucoma, Hypertension] [4:Glaucoma, Hypertension] [5:Glaucoma, Hypertension] Comorbid History: [3:03/16/2020] [4:03/29/2020] [5:04/19/2020] Date Acquired: [3:7] [4:7] [5:4]  Weeks of Treatment: [3:Open] [4:Open] [5:Open] Wound Status: [3:No] [4:No] [5:Yes] Clustered Wound: [3:N/A] [4:N/A] [5:10] Clustered Quantity: [3:0.6x0.5x0.1] [4:2x1.2x0.7] [5:8.3x8.5x0.1] Measurements L x W x D (cm) [3:0.236] [4:1.885] [5:55.41] A (cm) : rea [3:0.024] [4:1.319] [5:5.541] Volume (cm) : [3:97.80%] [4:68.00%] [5:-3574.40%] % Reduction in A rea: [3:97.70%] [4:87.60%] [5:-3569.50%] % Reduction in Volume: [4:12] Position 1 (o'clock): [4:2] Maximum Distance 1 (cm): [3:No] [4:Yes] [5:No] Tunneling: [3:Full Thickness Without Exposed] [4:Category/Stage IV] [5:Full Thickness Without Exposed] Classification: [3:Support Structures Small] [4:Medium] [5:Support Structures Medium] Exudate Amount: [3:Serosanguineous] [4:Serosanguineous] [5:Serosanguineous] Exudate Type: [3:red, brown] [4:red, brown] [5:red, brown] Exudate Color: [3:Flat and Intact] [4:Epibole] [5:Flat and Intact] Wound Margin: [3:Large (67-100%)] [4:Large (67-100%)] [5:Large (67-100%)] Granulation Amount: [3:Red] [4:Pink] [5:Pink] Granulation Quality: [3:None Present (0%)] [4:None Present (0%)] [5:None Present (0%)] Necrotic Amount: [3:Fat Layer (Subcutaneous Tissue): Yes Fat Layer (Subcutaneous Tissue): Yes Fat Layer (Subcutaneous Tissue): Yes] Exposed Structures: [3:Fascia: No Tendon: No Muscle: No Joint: No Bone: No Large (67-100%)] [4:Fascia: No Tendon: No Muscle: No Joint: No Bone: No None] [5:Fascia: No Tendon: No Muscle: No Joint: No Bone: No None] Wound Number: 7 [id number] N/A Photos: No Photos No Photos  N/A Right, Lateral Lower Leg Left, Proximal, Anterior Lower Leg N/A Wound Location: Shear/Friction Gradually Appeared N/A Wounding Event: Skin Tear Skin Tear N/A Primary Etiology: Glaucoma, Hypertension Glaucoma, Hypertension N/A Comorbid History: 05/03/2020 05/17/2020 N/A Date Acquired: 2 0 N/A Weeks of Treatment: Open Open N/A Wound Status: No No N/A Clustered Wound: N/A N/A N/A Clustered Quantity: 1x0.8x0.1 0.8x1.1x0.1 N/A Measurements L x W x D (cm) 0.628 0.691 N/A A (cm) : rea 0.063 0.069 N/A Volume (cm) : 33.30% 0.00% N/A % Reduction in A rea: 66.50% 0.00% N/A % Reduction in Volume: No No N/A Tunneling: Full Thickness Without Exposed Full Thickness Without Exposed N/A Classification: Support Structures Support Structures Medium Medium N/A Exudate Amount: Serosanguineous Serosanguineous N/A Exudate Type: red, brown red, brown N/A Exudate Color: Distinct, outline attached Distinct, outline attached N/A Wound Margin: Large (67-100%) Large (67-100%) N/A Granulation Amount: Red, Pink Red N/A Granulation Quality: None Present (0%) None Present (0%) N/A Necrotic Amount: Fat Layer (Subcutaneous Tissue): Yes Fat Layer (Subcutaneous Tissue): Yes N/A Exposed Structures: Fascia: No Fascia: No Tendon: No Tendon: No Muscle: No Muscle: No Joint: No Joint: No Bone: No Bone: No Large (67-100%) Small (1-33%) N/A Epithelialization: Treatment Notes Wound #3 (Left, Posterior Lower Leg) 1. Cleanse With Wound Cleanser Soap and water 2. Periwound Care Moisturizing lotion TCA Cream 3. Primary Dressing Applied Calcium Alginate Ag 4. Secondary Dressing ABD Pad Dry Gauze 6. Support Layer Applied Kerlix/Coban Notes netting. Wound #4 (Sacrum) 1. Cleanse With Wound Cleanser 2. Periwound Care Skin Prep 3. Primary Dressing Applied Collegen AG Other primary dressing (specifiy in notes) 4. Secondary Dressing Foam Border Dressing 5. Secured With Self  Adhesive Bandage Notes saline moistened gauze packing Wound #5 (Left, Anterior Lower Leg) 1. Cleanse With Wound Cleanser Soap and water 2. Periwound Care Moisturizing lotion TCA Cream 3. Primary Dressing Applied Calcium Alginate Ag 4. Secondary Dressing ABD Pad Dry Gauze 6. Support Layer Applied Kerlix/Coban Notes netting. Wound #7 (Right, Lateral Lower Leg) 1. Cleanse With Wound Cleanser Soap and water 2. Periwound Care Moisturizing lotion TCA Cream 3. Primary Dressing Applied Calcium Alginate Ag 4. Secondary Dressing ABD Pad Dry Gauze 6. Support Layer Applied Kerlix/Coban Notes netting. Wound #8 (Left, Proximal, Anterior Lower Leg) 1. Cleanse With Wound Cleanser Soap and water 2. Periwound Care Moisturizing lotion TCA Cream 3. Primary Dressing Applied Calcium Alginate Ag 4.  Secondary Dressing ABD Pad Dry Gauze 6. Support Layer Applied Kerlix/Coban Notes netting. Electronic Signature(s) Signed: 05/17/2020 5:31:38 PM By: [name] RN, BSN Signed: 05/18/2020 7:37:15 AM By: Linton Ham MD Entered By: Linton Ham on 05/17/2020 12:45:22 -------------------------------------------------------------------------------- Multi-Disciplinary Care Plan Details Patient Name: Date of Service: [name], [name] 05/17/2020 10:30 A M Medical Record Number: [id number] Patient Account Number: [id number] Date of Birth/Sex: Treating RN: 1923-01-28 (86 y.o. [name] Primary Care Josealberto Montalto: MA ST, MA N Other Clinician: Referring Chanel Mcadams: Treating Bryam Taborda/Extender: Linton Ham MA ST, MA N Weeks in Treatment: 7 Active Inactive Pressure Nursing Diagnoses: Knowledge deficit related to causes and risk factors for pressure ulcer development Knowledge deficit related to management of pressures ulcers Potential for impaired tissue integrity related to pressure, friction, moisture, and shear Goals: Patient/caregiver will verbalize risk factors for  pressure ulcer development Date Initiated: 03/29/2020 Target Resolution Date: 06/04/2020 Goal Status: Active Patient/caregiver will verbalize understanding of pressure ulcer management Date Initiated: 03/29/2020 Date Inactivated: 05/03/2020 Target Resolution Date: 04/30/2020 Goal Status: Met Interventions: Assess: immobility, friction, shearing, incontinence upon admission and as needed Assess offloading mechanisms upon admission and as needed Assess potential for pressure ulcer upon admission and as needed Provide education on pressure ulcers Notes: Wound/Skin Impairment Nursing Diagnoses: Impaired tissue integrity Knowledge deficit related to ulceration/compromised skin integrity Goals: Patient/caregiver will verbalize understanding of skin care regimen Date Initiated: 03/29/2020 Target Resolution Date: 06/04/2020 Goal Status: Active Interventions: Assess patient/caregiver ability to obtain necessary supplies Assess patient/caregiver ability to perform ulcer/skin care regimen upon admission and as needed Assess ulceration(s) every visit Provide education on ulcer and skin care Notes: Electronic Signature(s) Signed: 05/17/2020 5:31:38 PM By: [name] RN, BSN Entered By: [name] on 05/17/2020 14:41:01 -------------------------------------------------------------------------------- Pain Assessment Details Patient Name: Date of Service: [name], [name] 05/17/2020 10:30 A M Medical Record Number: [id number] Patient Account Number: [id number] Date of Birth/Sex: Treating RN: 06-Jan-1923 (86 y.o. [name] Primary Care Elery Cadenhead: MA ST, MA N Other Clinician: Referring Vivion Romano: Treating Haely Leyland/Extender: Linton Ham MA ST, MA N Weeks in Treatment: 7 Active Problems Location of Pain Severity and Description of Pain Patient Has Paino No Site Locations Pain Management and Medication Current Pain Management: Electronic Signature(s) Signed: 05/17/2020 1:08:31  PM By: Sandre Kitty Signed: 05/17/2020 5:31:38 PM By: [name] RN, BSN Entered By: Sandre Kitty on 05/17/2020 10:53:43 -------------------------------------------------------------------------------- Patient/Caregiver Education Details Patient Name: Date of Service: HA NSO Delane Ginger, [name] 9/27/2021andnbsp10:30 A M Medical Record Number: [id number] Patient Account Number: [id number] Date of Birth/Gender: Treating RN: 1923-01-02 (86 y.o. [name] Primary Care Physician: MA ST, MA N Other Clinician: Referring Physician: Treating Physician/Extender: Linton Ham MA ST, MA N Weeks in Treatment: 7 Education Assessment Education Provided To: Patient Education Topics Provided Pressure: Methods: Explain/Verbal Responses: State content correctly Wound/Skin Impairment: Methods: Explain/Verbal Responses: State content correctly Electronic Signature(s) Signed: 05/17/2020 5:31:38 PM By: [name] RN, BSN Entered By: [name] on 05/17/2020 14:41:56 -------------------------------------------------------------------------------- Wound Assessment Details Patient Name: Date of Service: [name], [name] 05/17/2020 10:30 A M Medical Record Number: [id number] Patient Account Number: [id number] Date of Birth/Sex: Treating RN: 1922/09/12 (86 y.o. [name] Primary Care Adelei [name]: MA ST, MA N Other Clinician: Referring Neelah Mannings: Treating Mayola Mcbain/Extender: Linton Ham MA ST, MA N Weeks in Treatment: 7 Wound Status Wound Number: 3 Primary Etiology: Skin Tear Wound Location: Left, Posterior Lower Leg Wound Status: Open Wounding Event: Gradually Appeared Comorbid History: Glaucoma, Hypertension Date Acquired:  03/16/2020 Weeks Of Treatment: 7 Clustered Wound: No Wound Measurements Length: (cm) 0.6 Width: (cm) 0.5 Depth: (cm) 0.1 Area: (cm) 0.236 Volume: (cm) 0.024 % Reduction in Area: 97.8% % Reduction in Volume: 97.7% Epithelialization: Large  (67-100%) Tunneling: No Undermining: No Wound Description Classification: Full Thickness Without Exposed Support Structures Wound Margin: Flat and Intact Exudate Amount: Small Exudate Type: Serosanguineous Exudate Color: red, brown Foul Odor After Cleansing: No Slough/Fibrino No Wound Bed Granulation Amount: Large (67-100%) Exposed Structure Granulation Quality: Red Fascia Exposed: No Necrotic Amount: None Present (0%) Fat Layer (Subcutaneous Tissue) Exposed: Yes Tendon Exposed: No Muscle Exposed: No Joint Exposed: No Bone Exposed: No Treatment Notes Wound #3 (Left, Posterior Lower Leg) 1. Cleanse With Wound Cleanser Soap and water 2. Periwound Care Moisturizing lotion TCA Cream 3. Primary Dressing Applied Calcium Alginate Ag 4. Secondary Dressing ABD Pad Dry Gauze 6. Support Layer Applied Kerlix/Coban Notes netting. Electronic Signature(s) Signed: 05/17/2020 5:26:25 PM By: Deon Pilling Signed: 05/17/2020 5:31:38 PM By: [name] RN, BSN Entered By: Deon Pilling on 05/17/2020 11:30:36 -------------------------------------------------------------------------------- Wound Assessment Details Patient Name: Date of Service: [name], [name] 05/17/2020 10:30 A M Medical Record Number: [id number] Patient Account Number: [id number] Date of Birth/Sex: Treating RN: June 09, 1923 (86 y.o. [name] Primary Care Alexandru Moorer: MA ST, MA N Other Clinician: Referring Masaki Rothbauer: Treating Nimco Bivens/Extender: Linton Ham MA ST, MA N Weeks in Treatment: 7 Wound Status Wound Number: 4 Primary Etiology: Pressure Ulcer Wound Location: Sacrum Wound Status: Open Wounding Event: Gradually Appeared Comorbid History: Glaucoma, Hypertension Date Acquired: 03/29/2020 Weeks Of Treatment: 7 Clustered Wound: No Wound Measurements Length: (cm) 2 Width: (cm) 1.2 Depth: (cm) 0.7 Area: (cm) 1.885 Volume: (cm) 1.319 % Reduction in Area: 68% % Reduction in Volume:  87.6% Epithelialization: None Tunneling: Yes Position (o'clock): 12 Maximum Distance: (cm) 2 Undermining: No Wound Description Classification: Category/Stage IV Wound Margin: Epibole Exudate Amount: Medium Exudate Type: Serosanguineous Exudate Color: red, brown Foul Odor After Cleansing: No Slough/Fibrino No Wound Bed Granulation Amount: Large (67-100%) Exposed Structure Granulation Quality: Pink Fascia Exposed: No Necrotic Amount: None Present (0%) Fat Layer (Subcutaneous Tissue) Exposed: Yes Tendon Exposed: No Muscle Exposed: No Joint Exposed: No Bone Exposed: No Treatment Notes Wound #4 (Sacrum) 1. Cleanse With Wound Cleanser 2. Periwound Care Skin Prep 3. Primary Dressing Applied Collegen AG Other primary dressing (specifiy in notes) 4. Secondary Dressing Foam Border Dressing 5. Secured With Self Adhesive Bandage Notes saline moistened gauze packing Electronic Signature(s) Signed: 05/17/2020 5:26:25 PM By: Deon Pilling Signed: 05/17/2020 5:31:38 PM By: [name] RN, BSN Entered By: Deon Pilling on 05/17/2020 11:33:01 -------------------------------------------------------------------------------- Wound Assessment Details Patient Name: Date of Service: [name], [name] 05/17/2020 10:30 A M Medical Record Number: [id number] Patient Account Number: [id number] Date of Birth/Sex: Treating RN: 03/02/1923 (86 y.o. [name] Primary Care Annaly Skop: MA ST, MA N Other Clinician: Referring Arasely Akkerman: Treating Lerin Jech/Extender: Linton Ham MA ST, MA N Weeks in Treatment: 7 Wound Status Wound Number: 5 Primary Etiology: Venous Leg Ulcer Wound Location: Left, Anterior Lower Leg Wound Status: Open Wounding Event: Gradually Appeared Comorbid History: Glaucoma, Hypertension Date Acquired: 04/19/2020 Weeks Of Treatment: 4 Clustered Wound: Yes Wound Measurements Length: (cm) 8. Width: (cm) 8. Depth: (cm) 0. Clustered Quantity: 10 Area: (cm)  5 Volume: (cm) 5 3 % Reduction in Area: -3574.4% 5 % Reduction in Volume: -3569.5% 1 Epithelialization: None Tunneling: No 5.41 Undermining: No .541 Wound Description Classification: Full Thickness Without Exposed Support Str Wound Margin: Flat and Intact  Exudate Amount: Medium Exudate Type: Serosanguineous Exudate Color: red, brown uctures Foul Odor After Cleansing: No Slough/Fibrino No Wound Bed Granulation Amount: Large (67-100%) Exposed Structure Granulation Quality: Pink Fascia Exposed: No Necrotic Amount: None Present (0%) Fat Layer (Subcutaneous Tissue) Exposed: Yes Tendon Exposed: No Muscle Exposed: No Joint Exposed: No Bone Exposed: No Treatment Notes Wound #5 (Left, Anterior Lower Leg) 1. Cleanse With Wound Cleanser Soap and water 2. Periwound Care Moisturizing lotion TCA Cream 3. Primary Dressing Applied Calcium Alginate Ag 4. Secondary Dressing ABD Pad Dry Gauze 6. Support Layer Applied Kerlix/Coban Notes netting. Electronic Signature(s) Signed: 05/17/2020 5:26:25 PM By: Deon Pilling Signed: 05/17/2020 5:31:38 PM By: [name] RN, BSN Entered By: Deon Pilling on 05/17/2020 11:31:50 -------------------------------------------------------------------------------- Wound Assessment Details Patient Name: Date of Service: [name], [name] 05/17/2020 10:30 A M Medical Record Number: [id number] Patient Account Number: [id number] Date of Birth/Sex: Treating RN: 1922-11-01 (86 y.o. [name] Primary Care Waverly Tarquinio: MA ST, MA N Other Clinician: Referring Alverda Nazzaro: Treating Audrianna Driskill/Extender: Linton Ham MA ST, MA N Weeks in Treatment: 7 Wound Status Wound Number: 7 Primary Etiology: Skin Tear Wound Location: Right, Lateral Lower Leg Wound Status: Open Wounding Event: Shear/Friction Comorbid History: Glaucoma, Hypertension Date Acquired: 05/03/2020 Weeks Of Treatment: 2 Clustered Wound: No Wound Measurements Length: (cm) 1 Width:  (cm) 0.8 Depth: (cm) 0.1 Area: (cm) 0.628 Volume: (cm) 0.063 % Reduction in Area: 33.3% % Reduction in Volume: 66.5% Epithelialization: Large (67-100%) Tunneling: No Undermining: No Wound Description Classification: Full Thickness Without Exposed Support Structures Wound Margin: Distinct, outline attached Exudate Amount: Medium Exudate Type: Serosanguineous Exudate Color: red, brown Foul Odor After Cleansing: No Slough/Fibrino No Wound Bed Granulation Amount: Large (67-100%) Exposed Structure Granulation Quality: Red, Pink Fascia Exposed: No Necrotic Amount: None Present (0%) Fat Layer (Subcutaneous Tissue) Exposed: Yes Tendon Exposed: No Muscle Exposed: No Joint Exposed: No Bone Exposed: No Treatment Notes Wound #7 (Right, Lateral Lower Leg) 1. Cleanse With Wound Cleanser Soap and water 2. Periwound Care Moisturizing lotion TCA Cream 3. Primary Dressing Applied Calcium Alginate Ag 4. Secondary Dressing ABD Pad Dry Gauze 6. Support Layer Applied Kerlix/Coban Notes netting. Electronic Signature(s) Signed: 05/17/2020 5:26:25 PM By: Deon Pilling Signed: 05/17/2020 5:31:38 PM By: [name] RN, BSN Entered By: Deon Pilling on 05/17/2020 11:32:08 -------------------------------------------------------------------------------- Wound Assessment Details Patient Name: Date of Service: [name], [name] 05/17/2020 10:30 A M Medical Record Number: [id number] Patient Account Number: [id number] Date of Birth/Sex: Treating RN: 1923-06-14 (86 y.o. [name] Primary Care Keonte Daubenspeck: MA ST, MA N Other Clinician: Referring Jasun Gasparini: Treating Erin Obando/Extender: Linton Ham MA ST, MA N Weeks in Treatment: 7 Wound Status Wound Number: 8 Primary Etiology: Skin Tear Wound Location: Left, Proximal, Anterior Lower Leg Wound Status: Open Wounding Event: Gradually Appeared Comorbid History: Glaucoma, Hypertension Date Acquired: 05/17/2020 Weeks Of Treatment:  0 Clustered Wound: No Wound Measurements Length: (cm) 0.8 Width: (cm) 1.1 Depth: (cm) 0.1 Area: (cm) 0.691 Volume: (cm) 0.069 % Reduction in Area: 0% % Reduction in Volume: 0% Epithelialization: Small (1-33%) Tunneling: No Undermining: No Wound Description Classification: Full Thickness Without Exposed Support Struct Wound Margin: Distinct, outline attached Exudate Amount: Medium Exudate Type: Serosanguineous Exudate Color: red, brown ures Foul Odor After Cleansing: No Slough/Fibrino No Wound Bed Granulation Amount: Large (67-100%) Exposed Structure Granulation Quality: Red Fascia Exposed: No Necrotic Amount: None Present (0%) Fat Layer (Subcutaneous Tissue) Exposed: Yes Tendon Exposed: No Muscle Exposed: No Joint Exposed: No Bone Exposed: No Treatment Notes Wound #8 (Left,  Proximal, Anterior Lower Leg) 1. Cleanse With Wound Cleanser Soap and water 2. Periwound Care Moisturizing lotion TCA Cream 3. Primary Dressing Applied Calcium Alginate Ag 4. Secondary Dressing ABD Pad Dry Gauze 6. Support Layer Applied Kerlix/Coban Notes netting. Electronic Signature(s) Signed: 05/17/2020 5:26:25 PM By: Deon Pilling Signed: 05/17/2020 5:31:38 PM By: [name] RN, BSN Entered By: Deon Pilling on 05/17/2020 11:32:30 -------------------------------------------------------------------------------- Vitals Details Patient Name: Date of Service: [name], [name] 05/17/2020 10:30 A M Medical Record Number: [id number] Patient Account Number: [id number] Date of Birth/Sex: Treating RN: 05/05/1923 (86 y.o. [name] Primary Care Makella Buckingham: MA ST, MA N Other Clinician: Referring Bonny Egger: Treating Debra Colon/Extender: Linton Ham MA ST, MA N Weeks in Treatment: 7 Vital Signs Time Taken: 10:53 Temperature (F): 97.4 Height (in): 64 Pulse (bpm): 92 Weight (lbs): 90 Respiratory Rate (breaths/min): 18 Body Mass Index (BMI): 15.4 Blood Pressure (mmHg):  106/74 Reference Range: 80 - 120 mg / dl Electronic Signature(s) Signed: 05/17/2020 1:08:31 PM By: Sandre Kitty Entered By: Sandre Kitty on 05/17/2020 10:53:37

## 2020-05-18 NOTE — Progress Notes (Signed)
Terri Acosta, Terri Acosta ([id number]) Visit Report for 05/17/2020 HPI Details Patient Name: Date of Service: [name], [name] IS A. 05/17/2020 10:30 A M Medical Record Number: [id number] Patient Account Number: [id number] Date of Birth/Sex: Treating RN: 02/24/1923 (86 y.o. Terri Acosta Primary Care Provider: MA ST, MA N Other Clinician: Referring Provider: Treating Provider/Extender: Linton Ham MA ST, MA N Weeks in Treatment: 7 History of Present Illness HPI Description: 86 year old female who was admitted in late May and discharged early June for suspected infectious diarrhea and after undergoing inpatient management with hydration, IV antibiotics, she was released to skilled nursing facility for recovery, during this time she developed a sacral wound that was classified as stage III for which they have been using Santyl and gauze packing with saline with daily changes. She also developed left leg skin tears and then shallow wounds that were addressed with silver alginate dressings. Patient originally is from assisted living and owing to her acute illness in June was transition to skilled nursing and the intention is for her to go back to assisted living once her recovery is complete. Patient denies any significant pain or discomfort in the sacrum or the leg areas. Intake and appetite have been good, denies any diarrhea or constipation. She denies any fevers or chills. Patient has atrial fibrillation with irregular heart rhythm with frequent episodes of tachycardia in the 100-120 range, she also runs borderline low blood pressures. Other medical problems includes chronic anemia latest hemoglobin 8.3 she is on iron supplements, mild anasarca with cirrhosis identified per CT scan of the abdomen, infrarenal AAA with partial clot 3.5 cm size. ABI in the clinic was difficult to obtain owing to edema in the leg, marked at 0.62, note that CT scan performed in the hospital showed significant  atherosclerotic disease in the major arteries 8/16; this is a patient who is at the skilled unit of [place] although she was in the independent up until May. She was hospitalized and developed a pressure sore on her sacrum. Apparently she also had some abrasions to her left lower leg which was traumatic although I know none of the details. She was noted in this clinic to probably have PAD although this is not been aggressively investigated. We did ask for a x-ray of the lower sacrum according to the patient who seems cognitively intact that has not been done 8/30; 2-week follow-up. [facility] [place]. Pressure sore on her sacrum not much change today although the surface looks somewhat better with the Santyl. We are not doing well with the left leg, part of this is no doubt inadequate compression wrapping a friend's home which is sometimes just over the wound area. She has 2 new areas on the lateral part of the left leg the original wound on the medial part. We finally did get the x-ray of the lower sacrum which was done at the facility that was negative 9/13; 2-week follow-up. Apparently a friend's home cannot handle a wound VAC, which are case manager tried to call them. We are using silver collagen to this area. She has an area also on the left leg but a new area today on the right. 9/27 2-week follow-up. The patient's area on the lower sacrum/coccyx actually looks some better. Healthy looking tissue there is no exposed bone She has areas on the left leg with superficial loss of epithelium. Severely dry scaly skin likely chronic venous insufficiency On the right most of  what she has looks like possible skin cancers. Electronic Signature(s) Signed: 05/18/2020 7:37:15 AM By: Linton Ham MD Entered By: Linton Ham on 05/17/2020 12:46:30 -------------------------------------------------------------------------------- Physical Exam Details Patient  Name: Date of Service: [name], [name] 05/17/2020 10:30 A M Medical Record Number: [id number] Patient Account Number: [id number] Date of Birth/Sex: Treating RN: Feb 18, 1923 (86 y.o. Terri Acosta Primary Care Provider: MA ST, MA N Other Clinician: Referring Provider: Treating Provider/Extender: Linton Ham MA ST, MA N Weeks in Treatment: 7 Constitutional Sitting or standing Blood Pressure is within target range for patient.. Pulse regular and within target range for patient.[name] Respirations regular, non-labored and within target range.. Temperature is normal and within the target range for the patient.[name] Appears in no distress. 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 It looks as though that this is come in somewhat and there is healthy granulation. No exposed bone Superficial areas on the left anterior lower leg however there is loss of epithelial layer anteriorly. Several areas on the right mid lateral calf look like skin cancers to me. Electronic Signature(s) Signed: 05/18/2020 7:37:15 AM By: Linton Ham MD Entered By: Linton Ham on 05/17/2020 12:47:57 -------------------------------------------------------------------------------- Physician Orders Details Patient Name: Date of Service: [name], [name] 05/17/2020 10:30 A M Medical Record Number: [id number] Patient Account Number: [id number] Date of Birth/Sex: Treating RN: [birth year] (86 y.o. Terri Acosta Primary Care Provider: MA ST, MA N Other Clinician: Referring Provider: Treating Provider/Extender: Linton Ham MA ST, MA N Weeks in Treatment: 7 Verbal / Phone Orders: No Diagnosis Coding ICD-10 Coding Code Description L97.221 Non-pressure chronic ulcer of left calf limited to breakdown of skin L89.154 Pressure ulcer of sacral region, stage 4 I48.11 Longstanding persistent atrial fibrillation I87.312 Chronic venous hypertension (idiopathic) with ulcer  of left lower extremity Follow-up Appointments Return Appointment in 2 weeks. Dressing Change Frequency Change Dressing every other day. - all wounds Skin Barriers/Peri-Wound Care Moisturizing lotion - both legs with each dressing change TCA Cream or Ointment - mixed with lotion to both legs Wound Cleansing Clean wound with Normal Saline. - or wound cleanser Primary Wound Dressing Wound #3 Left,Posterior Lower Leg Calcium Alginate with Silver Wound #4 Sacrum Silver Collagen - apply saline moistened gauze over collagen Wound #5 Left,Anterior Lower Leg Calcium Alginate with Silver Wound #7 Right,Lateral Lower Leg Calcium Alginate with Silver Wound #8 Left,Proximal,Anterior Lower Leg Calcium Alginate with Silver Secondary Dressing Wound #3 Left,Posterior Lower Leg Dry Gauze Wound #4 Sacrum Foam Border Wound #5 Left,Anterior Lower Leg Dry Gauze Wound #7 Right,Lateral Lower Leg Dry Gauze Wound #8 Left,Proximal,Anterior Lower Leg Dry Gauze Edema Control Kerlix and Coban - Bilateral - ***WRAP FROM BASE OF TOES TO JUST BELOW BEND OF KNEE*** Avoid standing for long periods of time Elevate legs to the level of the heart or above for 30 minutes daily and/or when sitting, a frequency of: - throughout the day Off-Loading Low air-loss mattress (Group 2) Roho cushion for wheelchair Turn and reposition every 2 hours Electronic Signature(s) Signed: 05/17/2020 5:31:38 PM By: [name] RN, BSN Signed: 05/18/2020 7:37:15 AM By: Linton Ham MD Entered By: [name] on 05/17/2020 12:03:43 -------------------------------------------------------------------------------- Problem List Details Patient Name: Date of Service: [name], [name] 05/17/2020 10:30 A M Medical Record Number: [id number] Patient Account Number: [id number] Date of Birth/Sex: Treating RN: 06/25/1923 (86 y.o. Terri Acosta Primary Care Provider: MA ST, MA N Other Clinician: Referring Provider: Treating  Provider/Extender:  Linton Ham MA ST, MA N Weeks in Treatment: 7 Active Problems ICD-10 Encounter Code Description Active Date MDM Diagnosis L97.221 Non-pressure chronic ulcer of left calf limited to breakdown of skin 03/29/2020 No Yes L89.154 Pressure ulcer of sacral region, stage 4 04/05/2020 No Yes I48.11 Longstanding persistent atrial fibrillation 03/29/2020 No Yes I87.312 Chronic venous hypertension (idiopathic) with ulcer of left lower extremity 04/19/2020 No Yes Inactive Problems Resolved Problems Electronic Signature(s) Signed: 05/18/2020 7:37:15 AM By: Linton Ham MD Entered By: Linton Ham on 05/17/2020 12:45:02 -------------------------------------------------------------------------------- Progress Note Details Patient Name: Date of Service: [name], [name] 05/17/2020 10:30 A M Medical Record Number: [id number] Patient Account Number: [id number] Date of Birth/Sex: Treating RN: [birth year] (86 y.o. Terri Acosta Primary Care Provider: MA ST, MA N Other Clinician: Referring Provider: Treating Provider/Extender: Linton Ham MA ST, MA N Weeks in Treatment: 7 Subjective History of Present Illness (HPI) 86 year old female who was admitted in late May and discharged early June for suspected infectious diarrhea and after undergoing inpatient management with hydration, IV antibiotics, she was released to skilled nursing facility for recovery, during this time she developed a sacral wound that was classified as stage III for which they have been using Santyl and gauze packing with saline with daily changes. She also developed left leg skin tears and then shallow wounds that were addressed with silver alginate dressings. Patient originally is from assisted living and owing to her acute illness in June was transition to skilled nursing and the intention is for her to go back to assisted living once her recovery is complete. Patient denies any significant pain or  discomfort in the sacrum or the leg areas. Intake and appetite have been good, denies any diarrhea or constipation. She denies any fevers or chills. Patient has atrial fibrillation with irregular heart rhythm with frequent episodes of tachycardia in the 100-120 range, she also runs borderline low blood pressures. Other medical problems includes chronic anemia latest hemoglobin 8.3 she is on iron supplements, mild anasarca with cirrhosis identified per CT scan of the abdomen, infrarenal AAA with partial clot 3.5 cm size. ABI in the clinic was difficult to obtain owing to edema in the leg, marked at 0.62, note that CT scan performed in the hospital showed significant atherosclerotic disease in the major arteries 8/16; this is a patient who is at the skilled unit of [place] although she was in the independent up until May. She was hospitalized and developed a pressure sore on her sacrum. Apparently she also had some abrasions to her left lower leg which was traumatic although I know none of the details. She was noted in this clinic to probably have PAD although this is not been aggressively investigated. We did ask for a x-ray of the lower sacrum according to the patient who seems cognitively intact that has not been done 8/30; 2-week follow-up. She is [facility] [place]. Pressure sore on her sacrum not much change today although the surface looks somewhat better with the Santyl. We are not doing well with the left leg, part of this is no doubt inadequate compression wrapping a friend's home which is sometimes just over the wound area. She has 2 new areas on the lateral part of the left leg the original wound on the medial part. We finally did get the x-ray of the lower sacrum which was done at the facility that was negative 9/13; 2-week follow-up. Apparently a friend's home cannot handle  a wound VAC, which are case manager tried to call them. We are using silver  collagen to this area. She has an area also on the left leg but a new area today on the right. 9/27 2-week follow-up. The patient's area on the lower sacrum/coccyx actually looks some better. Healthy looking tissue there is no exposed bone ooShe has areas on the left leg with superficial loss of epithelium. Severely dry scaly skin likely chronic venous insufficiency ooOn the right most of what she has looks like possible skin cancers. Objective Constitutional Sitting or standing Blood Pressure is within target range for patient.. Pulse regular and within target range for patient.[name] Respirations regular, non-labored and within target range.. Temperature is normal and within the target range for the patient.[name] Appears in no distress. Vitals Time Taken: 10:53 AM, Height: 64 in, Weight: 90 lbs, BMI: 15.4, Temperature: 97.4 F, Pulse: 92 bpm, Respiratory Rate: 18 breaths/min, Blood Pressure: 106/74 mmHg. 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 It looks as though that this is come in somewhat and there is healthy granulation. No exposed bone ooSuperficial areas on the left anterior lower leg however there is loss of epithelial layer anteriorly. ooSeveral areas on the right mid lateral calf look like skin cancers to me. Integumentary (Hair, Skin) Wound #3 status is Open. Original cause of wound was Gradually Appeared. The wound is located on the Left,Posterior Lower Leg. The wound measures 0.6cm length x 0.5cm width x 0.1cm depth; 0.236cm^2 area and 0.024cm^3 volume. There is Fat Layer (Subcutaneous Tissue) exposed. There is no tunneling or undermining noted. There is a small amount of serosanguineous drainage noted. The wound margin is flat and intact. There is large (67-100%) red granulation within the wound bed. There is no necrotic tissue within the wound bed. Wound #4 status is Open. Original cause of wound was Gradually Appeared. The wound  is located on the Sacrum. The wound measures 2cm length x 1.2cm width x 0.7cm depth; 1.885cm^2 area and 1.319cm^3 volume. There is Fat Layer (Subcutaneous Tissue) exposed. There is no undermining noted, however, there is tunneling at 12:00 with a maximum distance of 2cm. There is a medium amount of serosanguineous drainage noted. The wound margin is epibole. There is large (67-100%) pink granulation within the wound bed. There is no necrotic tissue within the wound bed. Wound #5 status is Open. Original cause of wound was Gradually Appeared. The wound is located on the Left,Anterior Lower Leg. The wound measures 8.3cm length x 8.5cm width x 0.1cm depth; 55.41cm^2 area and 5.541cm^3 volume. There is Fat Layer (Subcutaneous Tissue) exposed. There is no tunneling or undermining noted. There is a medium amount of serosanguineous drainage noted. The wound margin is flat and intact. There is large (67-100%) pink granulation within the wound bed. There is no necrotic tissue within the wound bed. Wound #7 status is Open. Original cause of wound was Shear/Friction. The wound is located on the Right,Lateral Lower Leg. The wound measures 1cm length x 0.8cm width x 0.1cm depth; 0.628cm^2 area and 0.063cm^3 volume. There is Fat Layer (Subcutaneous Tissue) exposed. There is no tunneling or undermining noted. There is a medium amount of serosanguineous drainage noted. The wound margin is distinct with the outline attached to the wound base. There is large (67-100%) red, pink granulation within the wound bed. There is no necrotic tissue within the wound bed. Wound #8 status is Open. Original cause of wound was Gradually Appeared. The wound  is located on the Left,Proximal,Anterior Lower Leg. The wound measures 0.8cm length x 1.1cm width x 0.1cm depth; 0.691cm^2 area and 0.069cm^3 volume. There is Fat Layer (Subcutaneous Tissue) exposed. There is no tunneling or undermining noted. There is a medium amount of  serosanguineous drainage noted. The wound margin is distinct with the outline attached to the wound base. There is large (67-100%) red granulation within the wound bed. There is no necrotic tissue within the wound bed. 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 Dressing Change Frequency: Change Dressing every other day. - all wounds Skin Barriers/Peri-Wound Care: Moisturizing lotion - both legs with each dressing change TCA Cream or Ointment - mixed with lotion to both legs Wound Cleansing: Clean wound with Normal Saline. - or wound cleanser Primary Wound Dressing: Wound #3 Left,Posterior Lower Leg: Calcium Alginate with Silver Wound #4 Sacrum: Silver Collagen - apply saline moistened gauze over collagen Wound #5 Left,Anterior Lower Leg: Calcium Alginate with Silver Wound #7 Right,Lateral Lower Leg: Calcium Alginate with Silver Wound #8 Left,Proximal,Anterior Lower Leg: Calcium Alginate with Silver Secondary Dressing: Wound #3 Left,Posterior Lower Leg: Dry Gauze Wound #4 Sacrum: Foam Border Wound #5 Left,Anterior Lower Leg: Dry Gauze Wound #7 Right,Lateral Lower Leg: Dry Gauze Wound #8 Left,Proximal,Anterior Lower Leg: Dry Gauze Edema Control: Kerlix and Coban - Bilateral - ***WRAP FROM BASE OF TOES TO JUST BELOW BEND OF KNEE*** Avoid standing for long periods of time Elevate legs to the level of the heart or above for 30 minutes daily and/or when sitting, a frequency of: - throughout the day Off-Loading: Low air-loss mattress (Group 2) Roho cushion for wheelchair Turn and reposition every 2 hours 1. I prescribed triamcinolone 0.1% 1-4 and Cetaphil cream to her legs bilaterally before  wrapping 2. We are using kerlix and Coban only as the ABI we are able to get on the left was only 0.62. Primary dressing is silver alginate. 3. No change to the coccyx wound would actually looks somewhat better. We are using silver collagen with backing wet-to-dry Electronic Signature(s) Signed: 05/18/2020 7:37:15 AM By: Linton Ham MD Entered By: Linton Ham on 05/17/2020 12:49:52 -------------------------------------------------------------------------------- SuperBill Details Patient Name: Date of Service: [name], [name] 05/17/2020 Medical Record Number: [id number] Patient Account Number: [id number] Date of Birth/Sex: Treating RN: 1923/05/02 (86 y.o. Terri Acosta Primary Care Provider: MA ST, MA N Other Clinician: Referring Provider: Treating Provider/Extender: Linton Ham MA ST, MA N Weeks in Treatment: 7 Diagnosis Coding ICD-10 Codes Code Description [phone] Non-pressure chronic ulcer of left calf limited to breakdown of skin L89.154 Pressure ulcer of sacral region, stage 4 I48.11 Longstanding persistent atrial fibrillation I87.312 Chronic venous hypertension (idiopathic) with ulcer of left lower extremity Facility Procedures CPT4 Code: [id number] Description: [id number] - WOUND CARE VISIT-LEV 5 EST PT Modifier: Quantity: 1 Physician Procedures : CPT4 Code Description Modifier [id number] - WC PHYS LEVEL 3 - EST PT ICD-10 Diagnosis Description L89.154 Pressure ulcer of sacral region, stage 4 L97.221 Non-pressure chronic ulcer of left calf limited to breakdown of skin I87.312 Chronic venous  hypertension (idiopathic) with ulcer of left lower extremity Quantity: 1 Electronic Signature(s) Signed: 05/17/2020 5:31:38 PM By: [name] RN, BSN Signed: 05/18/2020 7:37:15 AM By: Linton Ham MD Entered By: [name] on 05/17/2020 14:46:39

## 2020-05-21 ENCOUNTER — Encounter: Payer: Self-pay | Admitting: Nurse Practitioner

## 2020-05-21 ENCOUNTER — Non-Acute Institutional Stay (SKILLED_NURSING_FACILITY): Payer: Medicare Other | Admitting: Nurse Practitioner

## 2020-05-21 DIAGNOSIS — F5105 Insomnia due to other mental disorder: Secondary | ICD-10-CM | POA: Diagnosis not present

## 2020-05-21 DIAGNOSIS — R609 Edema, unspecified: Secondary | ICD-10-CM | POA: Diagnosis not present

## 2020-05-21 DIAGNOSIS — F418 Other specified anxiety disorders: Secondary | ICD-10-CM

## 2020-05-21 DIAGNOSIS — H04123 Dry eye syndrome of bilateral lacrimal glands: Secondary | ICD-10-CM | POA: Insufficient documentation

## 2020-05-21 DIAGNOSIS — I4891 Unspecified atrial fibrillation: Secondary | ICD-10-CM

## 2020-05-21 DIAGNOSIS — D649 Anemia, unspecified: Secondary | ICD-10-CM

## 2020-05-21 DIAGNOSIS — L89153 Pressure ulcer of sacral region, stage 3: Secondary | ICD-10-CM

## 2020-05-21 NOTE — Assessment & Plan Note (Signed)
ophthalmology 05/21/20, recommended artificial tears qid.

## 2020-05-21 NOTE — Assessment & Plan Note (Signed)
BLE edema, persists, takes Torsemide qod. Update CMP/eGFR

## 2020-05-21 NOTE — Assessment & Plan Note (Signed)
Anemia, Hgb 8.3 03/08/20, takes Fe, Vit B12. Vit B12 311 03/08/20. Update CBC/diff.

## 2020-05-21 NOTE — Progress Notes (Signed)
Location:   [place] Room Number: 9 Place of Service:  SNF (31) Provider:  Marlana Latus, NP  Greydis Stlouis X, NP  Patient Care Team: Jaymond Waage X, NP as PCP - General (Internal Medicine) [place] Netta Cedars, MD as Consulting Physician (Orthopedic Surgery) Luberta Mutter, MD as Consulting Physician (Ophthalmology) Sydnee Levans, MD as Consulting Physician (Dermatology) Adrian Prows, MD as Consulting Physician (Cardiology)  Extended Emergency Contact Information Primary Emergency Contact: [place] Phone: [phone] Mobile Phone: [phone] Relation: Daughter  Code Status:  DNR Goals of care: Advanced Directive information Advanced Directives 05/21/2020  Does Patient Have a Medical Advance Directive? Yes  Type of Paramedic of [place];Living will  Does patient want to make changes to medical advance directive? No - Patient declined  Copy of [place] in Chart? Yes - validated most recent copy scanned in chart (See row information)  Would patient like information on creating a medical advance directive? -  Pre-existing out of facility DNR order (yellow form or pink MOST form) -     Chief Complaint  Patient presents with   Medical Management of Chronic Issues    Routine follow up visit.   Best Practice Recommendations    Covid- 19 vaccine,Pneumonia vaccine, Tetanus/Tdap, Flu vaccine   Quality Metric Gaps    DEXA scan    HPI:  Pt is a 86 y.o. female seen today for medical management of chronic diseases.    Left lower leg open wounds developed from previous skin tears, f/u wound care center.  Anemia, Hgb 8.3 03/08/20, takes Fe, Vit B12. Vit B12 311 03/08/20 Stage 3 sacral pressure ulcer, f/u at wound care cente Afib, takes Metoprolol, Diltiazem, off Eliquis.  BLE edema, persists, takes Torsemide  qod Mood/weight, takes Mirtazapine. gained #5Ibs in the past 2 months.      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 Norris MD   CATARACT EXTRACTION W/ INTRAOCULAR LENS IMPLANT Right 2010   CATARACT EXTRACTION W/ INTRAOCULAR LENS IMPLANT Left 10/2012   DILATION AND CURETTAGE OF UTERUS     EYE SURGERY Right 07/2009   cataract extraction/IOLI [name], MD   KNEE ARTHROSCOPY Right 2008   Torn meniscus, Dr. Veverly Fells    Allergies  Allergen Reactions   Lomotil [Diphenoxylate] Nausea And Vomiting   Sulfa Antibiotics Other (See Comments)    Unknown    Amlodipine Swelling    Allergies as of 05/21/2020      Reactions   Lomotil [diphenoxylate] Nausea And Vomiting   Sulfa Antibiotics Other (See Comments)   Unknown    Amlodipine Swelling  Medication List       Accurate as of May 21, 2020 11:59 PM. If you have any questions, ask your nurse or doctor.        ARGININE PO Take by mouth. Arginaid Extra (whey protein-arginine-c-e-zinc) 6-4.5-250 gram-gram-mg liquid. Twice A Day Between Meals 235m, oral, Twice A  Day Between Meals   diltiazem 120 MG 24 hr capsule Commonly known as: CARDIZEM CD Take 1 capsule (120 mg total) by mouth daily.   feeding supplement (PRO-STAT SUGAR FREE 64) Liqd Take 30 mLs by mouth 3 (three) times daily with meals.   ferrous sulfate 325 (65 FE) MG tablet Take 325 mg by mouth. Once a day on Sunday, Tuesday, Thursday and Saturday   loperamide 2 MG capsule Commonly known as: IMODIUM Take 1 capsule (2 mg total) by mouth every 6 (six) hours as needed for diarrhea or loose stools.   magnesium oxide 400 MG tablet Commonly known as: MAG-OX Take 400 mg by mouth daily.   metoprolol tartrate 50 MG tablet Commonly known as: LOPRESSOR Take 75 mg by mouth 2 (two) times daily.   mirtazapine 7.5 MG tablet Commonly known as: REMERON Take 7.5 mg by mouth every other day.   multivitamin-lutein Caps capsule Take 1 capsule by mouth in the morning and at bedtime.   potassium chloride 10 MEQ tablet Commonly known as: KLOR-CON Take 30 mEq by mouth daily.   saccharomyces boulardii 250 MG capsule Commonly known as: FLORASTOR Take 250 mg by mouth 2 (two) times daily.   torsemide 20 MG tablet Commonly known as: DEMADEX Take 20 mg by mouth every other day.   triamcinolone cream 0.1 % Commonly known as: KENALOG Apply 1 application topically every other day. Once a day. Apply to legs with esch dressing change.   vitamin B-12 1000 MCG tablet Commonly known as: CYANOCOBALAMIN Take 1,000 mcg by mouth daily.   zinc oxide 20 % ointment Apply 1 application topically as needed for irritation.       Review of Systems  Constitutional: Negative for activity change, fever and unexpected weight change.       Weight gained about #5Ibs in the past 2 months  HENT: Positive for hearing loss. Negative for congestion and trouble swallowing.   Eyes: Negative for visual disturbance.  Respiratory: Positive for shortness of breath. Negative for cough and wheezing.        Chronic DOE,  uses O2  Cardiovascular: Positive for leg swelling. Negative for chest pain and palpitations.  Gastrointestinal: Negative for abdominal pain and constipation.  Genitourinary: Negative for dysuria and urgency.  Musculoskeletal: Positive for gait problem.  Skin: Positive for wound. Negative for color change.  Neurological: Negative for speech difficulty, weakness, light-headedness and headaches.  Psychiatric/Behavioral: Negative for confusion and sleep disturbance. The patient is not nervous/anxious.     Immunization History  Administered Date(s) Administered   Influenza Whole 08/21/2010, 05/21/2012   Influenza, High Dose Seasonal PF 05/30/2017, 06/04/2019   Influenza,inj,Quad PF,6+ Mos 05/23/2018   Influenza-Unspecified 06/04/2014, 05/20/2015, 06/08/2016   Pneumococcal Conjugate-13 08/22/1999   Td 08/22/1999   Zoster 08/21/2005   Pertinent  Health Maintenance Due  Topic Date Due   DEXA SCAN  Never done   PNA vac Low Risk Adult (2 of 2 - PPSV23) 08/21/2000   INFLUENZA VACCINE  03/21/2020   Fall Risk  07/23/2019 01/15/2019 07/17/2018 06/04/2018 09/12/2017  Falls in the past year? 0 0 0 No No  Number falls in past yr: 0 0 0 - -  Injury with Fall? 0 0 0 - -   Functional Status Survey:    Vitals:   05/21/20 1055  BP: 114/62  Pulse: (!) 103  Resp: 18  Temp: 98.2 F (36.8 C)  SpO2: 97%  Weight: 105 lb 4.8 oz (47.8 kg)  Height: '5\' 2"'  (1.575 m)   Body mass index is 19.26 kg/m. Physical Exam Vitals and nursing note reviewed.  Constitutional:      Appearance: Normal appearance.  HENT:     Head: Normocephalic and atraumatic.     Mouth/Throat:     Mouth: Mucous membranes are moist.  Eyes:     Extraocular Movements: Extraocular movements intact.     Conjunctiva/sclera: Conjunctivae normal.     Pupils: Pupils are equal, round, and reactive to light.     Comments: Crusted eyelashes, left eye low vision  Cardiovascular:     Rate and Rhythm: Tachycardia present.  Rhythm irregular.     Heart sounds: No murmur heard.      Comments: Weak DP pulses. HR 100s Pulmonary:     Effort: Pulmonary effort is normal.     Breath sounds: No rales.  Abdominal:     General: Bowel sounds are normal.     Palpations: Abdomen is soft.     Tenderness: There is no abdominal tenderness.  Musculoskeletal:     Cervical back: Normal range of motion and neck supple.     Right lower leg: Edema present.     Left lower leg: Edema present.     Comments: 1+ edema BLE, LLE>RLE  Skin:    General: Skin is warm and dry.     Comments: sacral pressure wound(stage III), left lower leg venous ulcers are healing nicely-superficial wounds.    Neurological:     General: No focal deficit present.     Mental Status: She is alert and oriented to person, place, and time. Mental status is at baseline.     Gait: Gait abnormal.  Psychiatric:        Mood and Affect: Mood normal.        Behavior: Behavior normal.        Thought Content: Thought content normal.        Judgment: Judgment normal.     Labs reviewed: Recent Labs    01/24/20 0414 01/24/20 0414 01/25/20 0428 01/25/20 0428 01/26/20 0446 01/29/20 0000 02/12/20 0000 03/04/20 0000 03/08/20 0000  NA 133*   < > 133*   < > 130*   < > 141 137 137  K 3.7   < > 3.6   < > 3.6   < > 3.7 4.0 4.0  CL 103   < > 103   < > 101   < > 93* 95* 97*  CO2 22   < > 21*   < > 22   < > 41* 34* 37*  GLUCOSE 123*  --  118*  --  109*  --   --   --   --   BUN 17   < > 15   < > 15   < > 23* 26* 24*  CREATININE 0.70   < > 0.61   < > 0.66   < > 0.7 0.7 0.6  CALCIUM 8.2*   < > 8.0*   < > 7.7*   < > 8.2* 8.3* 8.5*  MG 1.6*   < > 2.1  --  1.8  --   --   --  1.6   < > =  values in this interval not displayed.   Recent Labs    07/14/19 0820 07/14/19 0820 01/15/20 0840 01/15/20 0840 01/18/20 1302 02/12/20 0000 03/08/20 0000  AST 15   < > 12   < > 14* 20 12*  ALT 8   < > 7   < > '10 20 7  ' ALKPHOS  --   --   --   --  47 47 47  BILITOT 0.5  --   0.7  --  1.0  --   --   PROT 6.1  --  5.9*  --  5.8*  --   --   ALBUMIN  --   --   --   --  3.0* 2.9* 2.7*   < > = values in this interval not displayed.   Recent Labs    01/24/20 0414 01/24/20 0414 01/25/20 0428 01/25/20 0428 01/26/20 0446 01/26/20 0446 01/29/20 0000 02/12/20 0000 03/08/20 0000  WBC 11.8*   < > 12.6*   < > 10.5  --  14.0   14.0 6.7 9.1  NEUTROABS 9.6*   < > 10.6*   < > 8.6*  --   --  5,105 7,280  HGB 11.1*   < > 9.9*   < > 10.0*   < > 12.6   12.6 10.1* 8.3*  HCT 35.7*   < > 31.4*   < > 31.6*   < > 0*   38 31* 26*  MCV 88.1  --  87.7  --  86.8  --   --   --   --   PLT 332   < > 279   < > 255   < > 328   328 188 271   < > = values in this interval not displayed.   Lab Results  Component Value Date   TSH 2.195 01/19/2020   No results found for: HGBA1C Lab Results  Component Value Date   CHOL 191 07/14/2019   HDL 65 07/14/2019   LDLCALC 110 (H) 07/14/2019   TRIG 71 07/14/2019   CHOLHDL 2.9 07/14/2019    Significant Diagnostic Results in last 30 days:  No results found.  Assessment/Plan Dry eyes ophthalmology 05/21/20, recommended artificial tears qid.   Edema BLE edema, persists, takes Torsemide qod. Update CMP/eGFR   Anemia Anemia, Hgb 8.3 03/08/20, takes Fe, Vit B12. Vit B12 311 03/08/20. Update CBC/diff.    Insomnia secondary to depression with anxiety Mood/weight, takes Mirtazapine. gained #5Ibs in the past 2 months.   Atrial fibrillation with rapid ventricular response (HCC) Heart rate is in control, occasionally HR in 100s, takes Metoprolol, Diltiazem, off Eliquis.    Pressure ulcer Stage 3 sacral pressure ulcer, f/u at wound care center, left lower leg open wounds developed from previous skin tears, f/u wound care center.       Family/ staff Communication: plan of care reviewed with the patient and charge nurse.   Labs/tests ordered:  CBC/diff, CMP/eGFR  Time spend 35 minutes.

## 2020-05-21 NOTE — Assessment & Plan Note (Signed)
Heart rate is in control, occasionally HR in 100s, takes Metoprolol, Diltiazem, off Eliquis.

## 2020-05-21 NOTE — Assessment & Plan Note (Signed)
Stage 3 sacral pressure ulcer, f/u at wound care center, left lower leg open wounds developed from previous skin tears, f/u wound care center.

## 2020-05-21 NOTE — Assessment & Plan Note (Signed)
Mood/weight, takes Mirtazapine. gained #5Ibs in the past 2 months.

## 2020-05-24 ENCOUNTER — Emergency Department (HOSPITAL_COMMUNITY): Payer: Medicare Other

## 2020-05-24 ENCOUNTER — Observation Stay (HOSPITAL_COMMUNITY): Payer: Medicare Other

## 2020-05-24 ENCOUNTER — Other Ambulatory Visit: Payer: Self-pay

## 2020-05-24 ENCOUNTER — Inpatient Hospital Stay (HOSPITAL_COMMUNITY)
Admission: EM | Admit: 2020-05-24 | Discharge: 2020-05-26 | DRG: 064 | Disposition: A | Discharge status: Home Health | Payer: Medicare Other | Source: Skilled Nursing Facility | Attending: Internal Medicine | Admitting: Internal Medicine

## 2020-05-24 ENCOUNTER — Encounter: Payer: Self-pay | Admitting: Nurse Practitioner

## 2020-05-24 ENCOUNTER — Encounter (HOSPITAL_COMMUNITY): Payer: Self-pay

## 2020-05-24 ENCOUNTER — Non-Acute Institutional Stay (SKILLED_NURSING_FACILITY): Payer: Medicare Other | Admitting: Nurse Practitioner

## 2020-05-24 DIAGNOSIS — I639 Cerebral infarction, unspecified: Secondary | ICD-10-CM | POA: Diagnosis present

## 2020-05-24 DIAGNOSIS — J449 Chronic obstructive pulmonary disease, unspecified: Secondary | ICD-10-CM | POA: Diagnosis present

## 2020-05-24 DIAGNOSIS — L97819 Non-pressure chronic ulcer of other part of right lower leg with unspecified severity: Secondary | ICD-10-CM | POA: Diagnosis present

## 2020-05-24 DIAGNOSIS — I83029 Varicose veins of left lower extremity with ulcer of unspecified site: Secondary | ICD-10-CM | POA: Diagnosis not present

## 2020-05-24 DIAGNOSIS — Z79899 Other long term (current) drug therapy: Secondary | ICD-10-CM

## 2020-05-24 DIAGNOSIS — Z20822 Contact with and (suspected) exposure to covid-19: Secondary | ICD-10-CM | POA: Diagnosis present

## 2020-05-24 DIAGNOSIS — L97929 Non-pressure chronic ulcer of unspecified part of left lower leg with unspecified severity: Secondary | ICD-10-CM

## 2020-05-24 DIAGNOSIS — E876 Hypokalemia: Secondary | ICD-10-CM | POA: Diagnosis present

## 2020-05-24 DIAGNOSIS — I63412 Cerebral infarction due to embolism of left middle cerebral artery: Secondary | ICD-10-CM

## 2020-05-24 DIAGNOSIS — Z85828 Personal history of other malignant neoplasm of skin: Secondary | ICD-10-CM

## 2020-05-24 DIAGNOSIS — Z23 Encounter for immunization: Secondary | ICD-10-CM

## 2020-05-24 DIAGNOSIS — L97829 Non-pressure chronic ulcer of other part of left lower leg with unspecified severity: Secondary | ICD-10-CM | POA: Diagnosis present

## 2020-05-24 DIAGNOSIS — Z66 Do not resuscitate: Secondary | ICD-10-CM | POA: Diagnosis present

## 2020-05-24 DIAGNOSIS — M810 Age-related osteoporosis without current pathological fracture: Secondary | ICD-10-CM | POA: Diagnosis present

## 2020-05-24 DIAGNOSIS — I5032 Chronic diastolic (congestive) heart failure: Secondary | ICD-10-CM | POA: Diagnosis present

## 2020-05-24 DIAGNOSIS — L89622 Pressure ulcer of left heel, stage 2: Secondary | ICD-10-CM | POA: Insufficient documentation

## 2020-05-24 DIAGNOSIS — I480 Paroxysmal atrial fibrillation: Secondary | ICD-10-CM | POA: Diagnosis present

## 2020-05-24 DIAGNOSIS — I110 Hypertensive heart disease with heart failure: Secondary | ICD-10-CM | POA: Diagnosis present

## 2020-05-24 DIAGNOSIS — R2990 Unspecified symptoms and signs involving the nervous system: Secondary | ICD-10-CM | POA: Diagnosis not present

## 2020-05-24 DIAGNOSIS — I83892 Varicose veins of left lower extremities with other complications: Secondary | ICD-10-CM

## 2020-05-24 DIAGNOSIS — I4891 Unspecified atrial fibrillation: Secondary | ICD-10-CM | POA: Diagnosis not present

## 2020-05-24 DIAGNOSIS — Z7982 Long term (current) use of aspirin: Secondary | ICD-10-CM

## 2020-05-24 DIAGNOSIS — I63512 Cerebral infarction due to unspecified occlusion or stenosis of left middle cerebral artery: Principal | ICD-10-CM | POA: Diagnosis present

## 2020-05-24 DIAGNOSIS — R4781 Slurred speech: Secondary | ICD-10-CM | POA: Diagnosis present

## 2020-05-24 DIAGNOSIS — E785 Hyperlipidemia, unspecified: Secondary | ICD-10-CM | POA: Diagnosis present

## 2020-05-24 DIAGNOSIS — D649 Anemia, unspecified: Secondary | ICD-10-CM | POA: Diagnosis present

## 2020-05-24 DIAGNOSIS — L89154 Pressure ulcer of sacral region, stage 4: Secondary | ICD-10-CM | POA: Diagnosis present

## 2020-05-24 DIAGNOSIS — G459 Transient cerebral ischemic attack, unspecified: Secondary | ICD-10-CM

## 2020-05-24 DIAGNOSIS — I10 Essential (primary) hypertension: Secondary | ICD-10-CM | POA: Diagnosis not present

## 2020-05-24 DIAGNOSIS — I83018 Varicose veins of right lower extremity with ulcer other part of lower leg: Secondary | ICD-10-CM | POA: Diagnosis present

## 2020-05-24 DIAGNOSIS — R29810 Facial weakness: Secondary | ICD-10-CM | POA: Diagnosis present

## 2020-05-24 DIAGNOSIS — R609 Edema, unspecified: Secondary | ICD-10-CM

## 2020-05-24 DIAGNOSIS — Z8673 Personal history of transient ischemic attack (TIA), and cerebral infarction without residual deficits: Secondary | ICD-10-CM | POA: Diagnosis present

## 2020-05-24 DIAGNOSIS — I83028 Varicose veins of left lower extremity with ulcer other part of lower leg: Secondary | ICD-10-CM | POA: Diagnosis present

## 2020-05-24 DIAGNOSIS — L89153 Pressure ulcer of sacral region, stage 3: Secondary | ICD-10-CM

## 2020-05-24 DIAGNOSIS — R29704 NIHSS score 4: Secondary | ICD-10-CM | POA: Diagnosis present

## 2020-05-24 DIAGNOSIS — Z993 Dependence on wheelchair: Secondary | ICD-10-CM

## 2020-05-24 DIAGNOSIS — Z87891 Personal history of nicotine dependence: Secondary | ICD-10-CM

## 2020-05-24 LAB — RAPID URINE DRUG SCREEN, HOSP PERFORMED
Amphetamines: NOT DETECTED
Barbiturates: NOT DETECTED
Benzodiazepines: NOT DETECTED
Cocaine: NOT DETECTED
Opiates: NOT DETECTED
Tetrahydrocannabinol: NOT DETECTED

## 2020-05-24 LAB — I-STAT CHEM 8, ED
BUN: 29 mg/dL — ABNORMAL HIGH (ref 8–23)
Calcium, Ion: 1.06 mmol/L — ABNORMAL LOW (ref 1.15–1.40)
Chloride: 102 mmol/L (ref 98–111)
Creatinine, Ser: 0.6 mg/dL (ref 0.44–1.00)
Glucose, Bld: 113 mg/dL — ABNORMAL HIGH (ref 70–99)
HCT: 33 % — ABNORMAL LOW (ref 36.0–46.0)
Hemoglobin: 11.2 g/dL — ABNORMAL LOW (ref 12.0–15.0)
Potassium: 3.5 mmol/L (ref 3.5–5.1)
Sodium: 142 mmol/L (ref 135–145)
TCO2: 30 mmol/L (ref 22–32)

## 2020-05-24 LAB — URINALYSIS, ROUTINE W REFLEX MICROSCOPIC
Bilirubin Urine: NEGATIVE
Glucose, UA: NEGATIVE mg/dL
Hgb urine dipstick: NEGATIVE
Ketones, ur: NEGATIVE mg/dL
Leukocytes,Ua: NEGATIVE
Nitrite: NEGATIVE
Protein, ur: NEGATIVE mg/dL
Specific Gravity, Urine: 1.012 (ref 1.005–1.030)
pH: 5 (ref 5.0–8.0)

## 2020-05-24 LAB — CBC
HCT: 35.1 % — ABNORMAL LOW (ref 36.0–46.0)
Hemoglobin: 10.3 g/dL — ABNORMAL LOW (ref 12.0–15.0)
MCH: 25.9 pg — ABNORMAL LOW (ref 26.0–34.0)
MCHC: 29.3 g/dL — ABNORMAL LOW (ref 30.0–36.0)
MCV: 88.4 fL (ref 80.0–100.0)
Platelets: 265 10*3/uL (ref 150–400)
RBC: 3.97 MIL/uL (ref 3.87–5.11)
RDW: 15.4 % (ref 11.5–15.5)
WBC: 11.7 10*3/uL — ABNORMAL HIGH (ref 4.0–10.5)
nRBC: 0 % (ref 0.0–0.2)

## 2020-05-24 LAB — DIFFERENTIAL
Abs Immature Granulocytes: 0.05 10*3/uL (ref 0.00–0.07)
Basophils Absolute: 0 10*3/uL (ref 0.0–0.1)
Basophils Relative: 0 %
Eosinophils Absolute: 0.1 10*3/uL (ref 0.0–0.5)
Eosinophils Relative: 0 %
Immature Granulocytes: 0 %
Lymphocytes Relative: 10 %
Lymphs Abs: 1.2 10*3/uL (ref 0.7–4.0)
Monocytes Absolute: 0.9 10*3/uL (ref 0.1–1.0)
Monocytes Relative: 8 %
Neutro Abs: 9.5 10*3/uL — ABNORMAL HIGH (ref 1.7–7.7)
Neutrophils Relative %: 82 %

## 2020-05-24 LAB — COMPREHENSIVE METABOLIC PANEL
ALT: 17 U/L (ref 0–44)
AST: 20 U/L (ref 15–41)
Albumin: 2.9 g/dL — ABNORMAL LOW (ref 3.5–5.0)
Alkaline Phosphatase: 63 U/L (ref 38–126)
Anion gap: 12 (ref 5–15)
BUN: 26 mg/dL — ABNORMAL HIGH (ref 8–23)
CO2: 29 mmol/L (ref 22–32)
Calcium: 8.8 mg/dL — ABNORMAL LOW (ref 8.9–10.3)
Chloride: 102 mmol/L (ref 98–111)
Creatinine, Ser: 0.68 mg/dL (ref 0.44–1.00)
GFR calc Af Amer: >60 mL/min (ref >60)
GFR calc non Af Amer: >60 mL/min (ref >60)
Glucose, Bld: 117 mg/dL — ABNORMAL HIGH (ref 70–99)
Potassium: 3.6 mmol/L (ref 3.5–5.1)
Sodium: 143 mmol/L (ref 135–145)
Total Bilirubin: 0.7 mg/dL (ref 0.3–1.2)
Total Protein: 6.3 g/dL — ABNORMAL LOW (ref 6.5–8.1)

## 2020-05-24 LAB — RESPIRATORY PANEL BY RT PCR (FLU A&B, COVID)
Influenza A by PCR: NEGATIVE
Influenza B by PCR: NEGATIVE
SARS Coronavirus 2 by RT PCR: NEGATIVE

## 2020-05-24 LAB — ETHANOL: Alcohol, Ethyl (B): <10 mg/dL (ref <10)

## 2020-05-24 LAB — PROTIME-INR
INR: 1.2 (ref 0.8–1.2)
Prothrombin Time: 14.6 seconds (ref 11.4–15.2)

## 2020-05-24 LAB — CBG MONITORING, ED: Glucose-Capillary: 116 mg/dL — ABNORMAL HIGH (ref 70–99)

## 2020-05-24 LAB — APTT: aPTT: 34 seconds (ref 24–36)

## 2020-05-24 MED ORDER — ASPIRIN 325 MG PO TABS
325.0000 mg | ORAL_TABLET | Freq: Every day | ORAL | Status: DC
  Administered 2020-05-24 – 2020-05-25 (×2): 325 mg via ORAL
  Filled 2020-05-24 (×2): qty 1

## 2020-05-24 MED ORDER — VITAMIN B-12 1000 MCG PO TABS
1000.0000 ug | ORAL_TABLET | Freq: Every day | ORAL | Status: DC
  Administered 2020-05-25 – 2020-05-26 (×2): 1000 ug via ORAL
  Filled 2020-05-24 (×2): qty 1

## 2020-05-24 MED ORDER — ASPIRIN 300 MG RE SUPP: 300.0000 mg | Freq: Every day | RECTAL | Status: DC

## 2020-05-24 MED ORDER — SACCHAROMYCES BOULARDII 250 MG PO CAPS
250.0000 mg | ORAL_CAPSULE | Freq: Two times a day (BID) | ORAL | Status: DC
  Administered 2020-05-25 – 2020-05-26 (×2): 250 mg via ORAL
  Filled 2020-05-24 (×6): qty 1

## 2020-05-24 MED ORDER — IOHEXOL 350 MG/ML SOLN
50.0000 mL | Freq: Once | INTRAVENOUS | Status: AC | PRN
  Administered 2020-05-24: 50 mL via INTRAVENOUS

## 2020-05-24 MED ORDER — METOPROLOL TARTRATE 50 MG PO TABS
75.0000 mg | ORAL_TABLET | Freq: Two times a day (BID) | ORAL | Status: DC
  Administered 2020-05-24 – 2020-05-26 (×4): 75 mg via ORAL
  Filled 2020-05-24: qty 3
  Filled 2020-05-24 (×2): qty 1
  Filled 2020-05-24: qty 3

## 2020-05-24 MED ORDER — ACETAMINOPHEN 325 MG PO TABS: 650.0000 mg | ORAL_TABLET | ORAL | Status: DC | PRN

## 2020-05-24 MED ORDER — DILTIAZEM HCL ER COATED BEADS 120 MG PO CP24
120.0000 mg | ORAL_CAPSULE | Freq: Every day | ORAL | Status: DC
  Administered 2020-05-25 – 2020-05-26 (×2): 120 mg via ORAL
  Filled 2020-05-24 (×2): qty 1

## 2020-05-24 MED ORDER — ACETAMINOPHEN 160 MG/5ML PO SOLN: 650.0000 mg | ORAL | Status: DC | PRN

## 2020-05-24 MED ORDER — ACETAMINOPHEN 650 MG RE SUPP: 650.0000 mg | RECTAL | Status: DC | PRN

## 2020-05-24 MED ORDER — PRO-STAT SUGAR FREE PO LIQD
30.0000 mL | Freq: Two times a day (BID) | ORAL | Status: DC
  Filled 2020-05-24 (×2): qty 30

## 2020-05-24 MED ORDER — STROKE: EARLY STAGES OF RECOVERY BOOK
Freq: Once | Status: AC
  Filled 2020-05-24 (×2): qty 1

## 2020-05-24 MED ORDER — MIRTAZAPINE 15 MG PO TABS
7.5000 mg | ORAL_TABLET | ORAL | Status: DC
  Administered 2020-05-24 – 2020-05-26 (×2): 7.5 mg via ORAL
  Filled 2020-05-24 (×3): qty 1

## 2020-05-24 MED ORDER — ENOXAPARIN SODIUM 30 MG/0.3ML ~~LOC~~ SOLN
30.0000 mg | SUBCUTANEOUS | Status: DC
  Administered 2020-05-24: 30 mg via SUBCUTANEOUS
  Filled 2020-05-24: qty 0.3

## 2020-05-24 MED ORDER — MAGNESIUM OXIDE 400 (241.3 MG) MG PO TABS
400.0000 mg | ORAL_TABLET | Freq: Every day | ORAL | Status: DC
  Administered 2020-05-25 – 2020-05-26 (×2): 400 mg via ORAL
  Filled 2020-05-24 (×3): qty 1

## 2020-05-24 NOTE — ED Notes (Signed)
Patient transported to MRI 

## 2020-05-24 NOTE — Progress Notes (Signed)
Location:   [facility] Room Number: 9 Place of Service:  SNF (31) Provider: Lennie Odor Wania Longstreth NP  Damyon Mullane X, NP  Patient Care Team: Sacha Topor X, NP as PCP - General (Internal Medicine) Melina Modena, [facility] Netta Cedars, MD as Consulting Physician (Orthopedic Surgery) Luberta Mutter, MD as Consulting Physician (Ophthalmology) Sydnee Levans, MD as Consulting Physician (Dermatology) Adrian Prows, MD as Consulting Physician (Cardiology)  Extended Emergency Contact Information Primary Emergency Contact: [place] Phone: [phone] Mobile Phone: [phone] Relation: Daughter  Code Status:  DNR Goals of care: Advanced Directive information Advanced Directives 05/21/2020  Does Patient Have a Medical Advance Directive? Yes  Type of Paramedic of [place];Living will  Does patient want to make changes to medical advance directive? No - Patient declined  Copy of [place] in Chart? Yes - validated most recent copy scanned in chart (See row information)  Would patient like information on creating a medical advance directive? -  Pre-existing out of facility DNR order (yellow form or pink MOST form) -     Chief Complaint  Patient presents with  . Acute Visit    Slurred speech, tongue feels heavy, HR 116bpm     HPI:  Pt is a 86 y.o. female seen today for an acute visit for sudden onset of slurred speech, stated she feels her tongue heavy, noted her mouth is pulling to the left, HR 116bpm, denied headache, palpitation, chest pain, or SOB.               Left lower leg open wounds developed from previous skin tears, healing nicely, f/u wound care center. Anemia, Hgb 8.3 03/08/20, takes Fe, Vit B12. Vit B12 311 03/08/20 Sacral pressure ulcer,f/[facility] Afib, takes Metoprolol, Diltiazem, off Eliquis.  BLE edema, persists, takes  Torsemide qod Mood/weight, takes Mirtazapine.gained #5Ibs in the past 2 months.    Past Medical History:  Diagnosis Date  . Abnormal uterine bleeding   . Actinic keratosis 02/13/2012  . Acute bronchospasm 10/31/2011  . Cancer ([place]) 04/2008   Skin cancer of low back [name], MD  . Cataract   . Disorder of bone and cartilage, unspecified 02/18/2007  . Dizziness and giddiness 08/30/2010  . Dysmenorrhea   . Edema 12/19/2011  . Endometriosis   . 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 Osteoarthrosis, unspecified whether generalized or localized, unspecified site 08/20/2006  . Osteoporosis   . Other abnormal blood chemistry 03/14/2011  . Other and unspecified hyperlipidemia 10/18/2010  . Palpitations 02/13/2012  . Postmenopausal bleeding 08/30/2010  . Shortness of breath 11/28/2011  . Supraventricular premature beats 05/23/2011  . Unspecified essential hypertension 08/20/2006   Past Surgical History:  Procedure Laterality Date  . CARPAL TUNNEL RELEASE  2004   S. Norris MD  . CATARACT EXTRACTION W/ INTRAOCULAR LENS IMPLANT Right 2010  . CATARACT EXTRACTION W/ INTRAOCULAR LENS IMPLANT Left 10/2012  . DILATION AND CURETTAGE OF UTERUS    . EYE SURGERY Right 07/2009   cataract extraction/IOLI [name], MD  . KNEE ARTHROSCOPY Right 2008   Torn meniscus, Dr. Veverly Fells    Allergies  Allergen Reactions  . Lomotil [Diphenoxylate] Nausea And Vomiting  . Sulfa Antibiotics Other (See Comments)    Pt unknown   . Amlodipine Swelling    Allergies as of 05/24/2020      Reactions   Lomotil [diphenoxylate] Nausea And Vomiting   Sulfa Antibiotics Other (See Comments)   Pt unknown  Amlodipine Swelling      Medication List       Accurate as of May 24, 2020  5:09 PM. If you have any questions, ask your nurse or doctor.        acetaminophen 325 MG tablet Commonly known as: TYLENOL Take 650 mg by mouth every 4 (four) hours as needed for mild pain or headache.     ARGININE PO Take by mouth. Arginaid Extra (whey protein-arginine-c-e-zinc) 6-4.5-250 gram-gram-mg liquid. Twice A Day Between Meals 250mL, oral, Twice A Day Between Meals   Artificial Tears 0.1-0.3 % Soln Generic drug: Dextran 70-Hypromellose Apply 1 drop to eye in the morning, at noon, in the evening, and at bedtime. Both eyes   diltiazem 120 MG 24 hr capsule Commonly known as: CARDIZEM CD Take 1 capsule (120 mg total) by mouth daily.   feeding supplement (PRO-STAT SUGAR FREE 64) Liqd Take 30 mLs by mouth 2 (two) times daily with a meal.   ferrous sulfate 325 (65 FE) MG tablet Take 325 mg by mouth. Once a day on Sunday, Tuesday, Thursday and Saturday   loperamide 2 MG capsule Commonly known as: IMODIUM Take 1 capsule (2 mg total) by mouth every 6 (six) hours as needed for diarrhea or loose stools.   magnesium oxide 400 MG tablet Commonly known as: MAG-OX Take 400 mg by mouth daily.   metoprolol tartrate 50 MG tablet Commonly known as: LOPRESSOR Take 75 mg by mouth 2 (two) times daily.   mirtazapine 7.5 MG tablet Commonly known as: REMERON Take 7.5 mg by mouth every other day.   multivitamin-lutein Caps capsule Take 1 capsule by mouth in the morning and at bedtime.   potassium chloride 10 MEQ tablet Commonly known as: KLOR-CON Take 30 mEq by mouth daily.   saccharomyces boulardii 250 MG capsule Commonly known as: FLORASTOR Take 250 mg by mouth 2 (two) times daily.   torsemide 20 MG tablet Commonly known as: DEMADEX Take 20 mg by mouth every other day.   triamcinolone cream 0.1 % Commonly known as: KENALOG Apply 1 application topically every other day. Once a day. Apply to legs with esch dressing change.   vitamin B-12 1000 MCG tablet Commonly known as: CYANOCOBALAMIN Take 1,000 mcg by mouth daily.   zinc oxide 20 % ointment Apply 1 application topically as needed for irritation.       Review of Systems  Constitutional: Negative for activity change,  fever and unexpected weight change.  HENT: Positive for hearing loss. Negative for congestion and trouble swallowing.   Eyes: Negative for visual disturbance.  Respiratory: Positive for shortness of breath. Negative for cough and wheezing.        Chronic DOE, uses O2  Cardiovascular: Positive for leg swelling. Negative for chest pain and palpitations.  Gastrointestinal: Negative for abdominal pain and constipation.  Genitourinary: Negative for dysuria and urgency.  Musculoskeletal: Positive for gait problem.  Skin: Positive for wound. Negative for color change.  Neurological: Positive for facial asymmetry, speech difficulty and weakness. Negative for dizziness, light-headedness and headaches.       Mouth is pulling to the left, slurred speech, tongue feels heavy.   Psychiatric/Behavioral: Negative for confusion and sleep disturbance. The patient is not nervous/anxious.     Immunization History  Administered Date(s) Administered  . Influenza Whole 08/21/2010, 05/21/2012  . Influenza, High Dose Seasonal PF 05/30/2017, 06/04/2019  . Influenza,inj,Quad PF,6+ Mos 05/23/2018  . Influenza-Unspecified 06/04/2014, 05/20/2015, 06/08/2016  . Pneumococcal Conjugate-13 08/22/1999  . Td 08/22/1999  .  Zoster 08/21/2005   Pertinent  Health Maintenance Due  Topic Date Due  . DEXA SCAN  Never done  . PNA vac Low Risk Adult (2 of 2 - PPSV23) 08/21/2000  . INFLUENZA VACCINE  03/21/2020   Fall Risk  07/23/2019 01/15/2019 07/17/2018 06/04/2018 09/12/2017  Falls in the past year? 0 0 0 No No  Number falls in past yr: 0 0 0 - -  Injury with Fall? 0 0 0 - -   Functional Status Survey:    Vitals:   05/24/20 1617  BP: (!) 134/95  Pulse: 95  Resp: 18  Temp: 98.2 F (36.8 C)  SpO2: 99%  Weight: 104 lb 1.6 oz (47.2 kg)  Height: 5\' 2"  (1.575 m)   Body mass index is 19.04 kg/m. Physical Exam Vitals and nursing note reviewed.  Constitutional:      Appearance: Normal appearance.  HENT:     Head:  Normocephalic and atraumatic.     Mouth/Throat:     Mouth: Mucous membranes are moist.  Eyes:     Extraocular Movements: Extraocular movements intact.     Conjunctiva/sclera: Conjunctivae normal.     Pupils: Pupils are equal, round, and reactive to light.     Comments: Crusted eyelashes, left eye low vision  Cardiovascular:     Rate and Rhythm: Tachycardia present. Rhythm irregular.     Heart sounds: No murmur heard.      Comments: Weak DP pulses.  Pulmonary:     Effort: Pulmonary effort is normal.     Breath sounds: No rales.  Abdominal:     General: Bowel sounds are normal.     Palpations: Abdomen is soft.     Tenderness: There is no abdominal tenderness.  Musculoskeletal:     Cervical back: Normal range of motion and neck supple.     Right lower leg: Edema present.     Left lower leg: Edema present.     Comments: 1+ edema BLE, LLE>RLE  Skin:    General: Skin is warm and dry.     Comments: sacral pressure wound(stage III), left lower leg venous ulcers are healing nicely-superficial wounds.    Neurological:     General: No focal deficit present.     Mental Status: She is alert and oriented to person, place, and time. Mental status is at baseline.     Cranial Nerves: Cranial nerve deficit present.     Motor: Weakness present.     Coordination: Coordination abnormal.     Gait: Gait abnormal.     Comments: Mouth is pulled to the left, slurred speech.   Psychiatric:        Mood and Affect: Mood normal.        Behavior: Behavior normal.        Thought Content: Thought content normal.        Judgment: Judgment normal.     Labs reviewed: Recent Labs    01/25/20 0428 01/25/20 0428 01/26/20 0446 01/29/20 0000 03/08/20 0000 03/08/20 0000 04/01/20 0000 05/24/20 1741 05/24/20 1756  NA 133*   < > 130*   < > 137   < > 140 143 142  K 3.6   < > 3.6   < > 4.0   < > 4.3 3.6 3.5  CL 103   < > 101   < > 97*   < > 99 102 102  CO2 21*   < > 22   < > 37*  --  34* 29  --  GLUCOSE 118*   < > 109*  --   --   --   --  117* 113*  BUN 15   < > 15   < > 24*   < > 25* 26* 29*  CREATININE 0.61   < > 0.66   < > 0.6   < > 0.8 0.68 0.60  CALCIUM 8.0*   < > 7.7*   < > 8.5*  --  8.5* 8.8*  --   MG 2.1  --  1.8  --  1.6  --   --   --   --    < > = values in this interval not displayed.   Recent Labs    01/15/20 0840 01/15/20 0840 01/18/20 1302 02/12/20 0000 03/08/20 0000 04/01/20 0000 05/24/20 1741  AST 12   < > 14*   < > 12* 12* 20  ALT 7   < > 10   < > 7 12 17   ALKPHOS  --   --  47   < > 47 54 63  BILITOT 0.7  --  1.0  --   --   --  0.7  PROT 5.9*  --  5.8*  --   --   --  6.3*  ALBUMIN  --   --  3.0*   < > 2.7* 2.7* 2.9*   < > = values in this interval not displayed.   Recent Labs    01/25/20 0428 01/25/20 0428 01/26/20 0446 01/29/20 0000 03/08/20 0000 03/08/20 0000 04/01/20 0000 05/24/20 1741 05/24/20 1756  WBC 12.6*   < > 10.5   < > 9.1  --  9.3 11.7*  --   NEUTROABS 10.6*   < > 8.6*   < > 7,280  --  7,719 9.5*  --   HGB 9.9*   < > 10.0*   < > 8.3*   < > 8.4* 10.3* 11.2*  HCT 31.4*   < > 31.6*   < > 26*   < > 27* 35.1* 33.0*  MCV 87.7  --  86.8  --   --   --   --  88.4  --   PLT 279   < > 255   < > 271  --  296 265  --    < > = values in this interval not displayed.   Lab Results  Component Value Date   TSH 2.095 05/25/2020   Lab Results  Component Value Date   HGBA1C 5.4 05/25/2020   Lab Results  Component Value Date   CHOL 133 05/25/2020   HDL 39 (L) 05/25/2020   LDLCALC 79 05/25/2020   TRIG 73 05/25/2020   CHOLHDL 3.4 05/25/2020    Significant Diagnostic Results in last 30 days:  CT ANGIO HEAD W OR WO CONTRAST  Result Date: 05/24/2020 CLINICAL DATA:  Left facial droop and slurred speech. Negative acute head CT. EXAM: CT ANGIOGRAPHY HEAD AND NECK TECHNIQUE: Multidetector CT imaging of the head and neck was performed using the standard protocol during bolus administration of intravenous contrast. Multiplanar CT image  reconstructions and MIPs were obtained to evaluate the vascular anatomy. Carotid stenosis measurements (when applicable) are obtained utilizing NASCET criteria, using the distal internal carotid diameter as the denominator. CONTRAST:  58mL OMNIPAQUE IOHEXOL 350 MG/ML SOLN COMPARISON:  Head CT same day FINDINGS: CTA NECK FINDINGS Aortic arch: Aortic atherosclerotic calcification. Normal branching pattern without origin stenosis. Right carotid system: Common carotid artery widely patent to the  bifurcation region. Calcified plaque at the carotid bifurcation and ICA bulb but no stenosis. Cervical ICA is tortuous but widely patent. Left carotid system: Common carotid artery widely patent to the bifurcation region. Calcified plaque at the carotid bifurcation and ICA bulb. Minimal diameter of the distal ICA bulb measures 3.2 mm. Compared to a more distal cervical ICA diameter of 5 mm, this indicates a 35% stenosis. Vertebral arteries: Both vertebral artery origins are patent. Some calcified plaque in the proximal right vertebral artery but without stenosis greater than 30%. Beyond that, both vertebral arteries widely patent through the cervical region to the foramen magnum. Skeleton: Chronic cervical spondylosis.  No acute finding. Other neck: No soft tissue mass or lymphadenopathy. Upper chest: Negative Review of the MIP images confirms the above findings CTA HEAD FINDINGS Anterior circulation: Both internal carotid arteries widely patent through the skull base and siphon regions. Ordinary siphon atherosclerotic calcification without stenosis. The anterior and middle cerebral vessels are patent. No large or medium vessel occlusion. No correctable proximal stenosis. Mild atherosclerotic irregularity of the more distal branch vessels. Posterior circulation: Both vertebral arteries widely patent to the basilar. No basilar stenosis. Posterior circulation branch vessels are patent. There is atherosclerotic narrowing and  irregularity of the more distal PCA branches. Venous sinuses: Patent and normal. Anatomic variants: None significant. Review of the MIP images confirms the above findings IMPRESSION: 1. No large or medium vessel occlusion. 2. Aortic atherosclerosis. 3. Atherosclerotic disease at both carotid bifurcations. 35% stenosis of the distal ICA bulb on the left. No measurable stenosis on the right. 4. Intracranial atherosclerotic narrowing and irregularity of the more distal branch vessels. 5. These results were communicated to Dr. Erlinda Hong At 5:40 pmon 10/4/2021by text page via the [facility] messaging system. Aortic Atherosclerosis (ICD10-I70.0). Electronically Signed   By: Nelson Chimes M.D.   On: 05/24/2020 17:40   CT ANGIO NECK W OR WO CONTRAST  Result Date: 05/24/2020 CLINICAL DATA:  Left facial droop and slurred speech. Negative acute head CT. EXAM: CT ANGIOGRAPHY HEAD AND NECK TECHNIQUE: Multidetector CT imaging of the head and neck was performed using the standard protocol during bolus administration of intravenous contrast. Multiplanar CT image reconstructions and MIPs were obtained to evaluate the vascular anatomy. Carotid stenosis measurements (when applicable) are obtained utilizing NASCET criteria, using the distal internal carotid diameter as the denominator. CONTRAST:  38mL OMNIPAQUE IOHEXOL 350 MG/ML SOLN COMPARISON:  Head CT same day FINDINGS: CTA NECK FINDINGS Aortic arch: Aortic atherosclerotic calcification. Normal branching pattern without origin stenosis. Right carotid system: Common carotid artery widely patent to the bifurcation region. Calcified plaque at the carotid bifurcation and ICA bulb but no stenosis. Cervical ICA is tortuous but widely patent. Left carotid system: Common carotid artery widely patent to the bifurcation region. Calcified plaque at the carotid bifurcation and ICA bulb. Minimal diameter of the distal ICA bulb measures 3.2 mm. Compared to a more distal cervical ICA diameter of 5 mm, this  indicates a 35% stenosis. Vertebral arteries: Both vertebral artery origins are patent. Some calcified plaque in the proximal right vertebral artery but without stenosis greater than 30%. Beyond that, both vertebral arteries widely patent through the cervical region to the foramen magnum. Skeleton: Chronic cervical spondylosis.  No acute finding. Other neck: No soft tissue mass or lymphadenopathy. Upper chest: Negative Review of the MIP images confirms the above findings CTA HEAD FINDINGS Anterior circulation: Both internal carotid arteries widely patent through the skull base and siphon regions. Ordinary siphon atherosclerotic calcification without stenosis.  The anterior and middle cerebral vessels are patent. No large or medium vessel occlusion. No correctable proximal stenosis. Mild atherosclerotic irregularity of the more distal branch vessels. Posterior circulation: Both vertebral arteries widely patent to the basilar. No basilar stenosis. Posterior circulation branch vessels are patent. There is atherosclerotic narrowing and irregularity of the more distal PCA branches. Venous sinuses: Patent and normal. Anatomic variants: None significant. Review of the MIP images confirms the above findings IMPRESSION: 1. No large or medium vessel occlusion. 2. Aortic atherosclerosis. 3. Atherosclerotic disease at both carotid bifurcations. 35% stenosis of the distal ICA bulb on the left. No measurable stenosis on the right. 4. Intracranial atherosclerotic narrowing and irregularity of the more distal branch vessels. 5. These results were communicated to Dr. Erlinda Hong At 5:40 pmon 10/4/2021by text page via the [facility] messaging system. Aortic Atherosclerosis (ICD10-I70.0). Electronically Signed   By: Nelson Chimes M.D.   On: 05/24/2020 17:40   MR BRAIN WO CONTRAST  Result Date: 05/25/2020 CLINICAL DATA:  Follow-up examination for acute stroke. EXAM: MRI HEAD WITHOUT CONTRAST TECHNIQUE: Multiplanar, multiecho pulse sequences of the  brain and surrounding structures were obtained without intravenous contrast. COMPARISON:  Prior CTs from 05/24/2020. FINDINGS: Brain: Diffuse prominence of the CSF containing spaces compatible with generalized cerebral atrophy. Patchy and confluent T2/FLAIR hyperintensity within the periventricular deep white matter both cerebral hemispheres most compatible chronic microvascular ischemic disease, moderate in nature. Small remote lacunar infarct noted at the left thalamus. Additional small remote left cerebellar infarct noted as well. Patchy restricted diffusion seen involving the cortical and subcortical aspect of the posterior left frontoparietal region, consistent with acute left MCA territory infarct. No associated hemorrhage or mass effect. No other evidence for acute or subacute ischemia. Gray-white matter differentiation otherwise maintained. Few additional punctate chronic micro hemorrhages noted at the left temporal occipital region and left cerebellum. No mass lesion, midline shift or mass effect. No hydrocephalus or extra-axial fluid collection. Pituitary gland and suprasellar region within normal limits. Midline structures intact. Multiple prominent arachnoid pits noted at the occipital calvarium. Vascular: Major intracranial vascular flow voids are maintained. Skull and upper cervical spine: Craniocervical junction within normal limits. Bone marrow signal intensity normal. No scalp soft tissue abnormality. Sinuses/Orbits: Patient status post bilateral ocular lens replacement. Paranasal sinuses are clear. No mastoid effusion. Other: None. IMPRESSION: 1. Patchy small volume acute ischemic left MCA territory infarct involving the posterior left frontoparietal region. No associated hemorrhage or mass effect. 2. Underlying age-related cerebral atrophy with moderate chronic small vessel ischemic disease, with small remote infarcts involving the left thalamus and left cerebellum. Electronically Signed   By:  Jeannine Boga M.D.   On: 05/25/2020 01:34   DG Chest Portable 1 View  Result Date: 05/24/2020 CLINICAL DATA:  Weakness. EXAM: PORTABLE CHEST 1 VIEW COMPARISON:  Jan 19, 2020 FINDINGS: There is cardiomegaly. There is a moderate-sized left-sided pleural effusion. There is a small right-sided pleural effusion. There is no pneumothorax. There is atelectasis at the lung bases. There are atherosclerotic changes of the thoracic aorta. There is no acute osseous abnormality. IMPRESSION: 1. Cardiomegaly with bilateral pleural effusions, moderate on the left and small on the right. 2. Bibasilar atelectasis. Electronically Signed   By: Constance Holster M.D.   On: 05/24/2020 19:09   CT HEAD CODE STROKE WO CONTRAST  Result Date: 05/24/2020 CLINICAL DATA:  Code stroke.  Acute neurological deficit. EXAM: CT HEAD WITHOUT CONTRAST TECHNIQUE: Contiguous axial images were obtained from the base of the skull through the vertex without  intravenous contrast. COMPARISON:  None. FINDINGS: Brain: Age related atrophy. Chronic small-vessel ischemic changes throughout the cerebral hemispheric white matter. No sign of acute infarction, mass lesion, hemorrhage, hydrocephalus or extra-axial collection. Vascular: There is atherosclerotic calcification of the major vessels at the base of the brain. Skull: Negative Sinuses/Orbits: Clear/normal Other: None ASPECTS ([place] Stroke Program Early CT Score) - Ganglionic level infarction (caudate, lentiform nuclei, internal capsule, insula, M1-M3 cortex): 7 - Supraganglionic infarction (M4-M6 cortex): 3 Total score (0-10 with 10 being normal): 10 IMPRESSION: 1. No acute finding by CT. Atrophy and chronic small-vessel ischemic changes. 2. ASPECTS is 10. 3. These results were communicated to Dr. Erlinda Hong At 5:23 pmon 10/4/2021by text page via the [facility] messaging system. Electronically Signed   By: Nelson Chimes M.D.   On: 05/24/2020 17:24    Assessment/Plan Stroke-like symptom The patient was  noted to have sudden onset of slurred speech. The patient stated she feels her tongue heavy. The patient was noted her mouth is pulling to the right, HR 116bpm, the patient denied headache, palpitation, chest pain, or SOB. No apparent weakness in all limbs.  Will transfer to ED for evaluation.    Venous stasis ulcer of left lower leg with edema of left lower leg (HCC) Left lower leg open wounds developed from previous skin tears, healing nicely, f/u wound care center.   Anemia Anemia, Hgb 8.3 03/08/20, takes Fe, Vit B12. Vit B12 311 03/08/20  Pressure ulcer Sacral pressure ulcer,f/u atwound care center, covered in dressing.    Atrial fibrillation with rapid ventricular response (HCC) HR 116bpm, takes Metoprolol, Diltiazem, off Eliquis.   Edema Persisted, moderate, continue Torsemide.   Insomnia secondary to depression with anxiety Stable weight, mood, sleep, continue Mirtazapine.     Family/ staff Communication: plan of care reviewed with the patient and charge nurse.   Labs/tests ordered:  none  Time spend 35 minutes.

## 2020-05-24 NOTE — Assessment & Plan Note (Addendum)
The patient was noted to have sudden onset of slurred speech. The patient stated she feels her tongue heavy. The patient was noted her mouth is pulling to the right, HR 116bpm, the patient denied headache, palpitation, chest pain, or SOB. No apparent weakness in all limbs.  Will transfer to ED for evaluation.

## 2020-05-24 NOTE — Assessment & Plan Note (Signed)
Anemia, Hgb 8.3 03/08/20, takes Fe, Vit B12. Vit B12 311 03/08/20

## 2020-05-24 NOTE — ED Triage Notes (Signed)
Pt arrived to ED via [facility] from [facility] w/ c/o stroke-like s/s. Pt LKW was 1530. Staff noticed slurred speech and R facial droop. EMS noticed gait problems when they helped pt to the stretcher. Pt at baseline uses a wheelchair and stands and pivots w/ help. Pt noted to be in Afib RVR and has a hx of the same.

## 2020-05-24 NOTE — ED Notes (Signed)
Updated pt's daughter ?

## 2020-05-24 NOTE — Assessment & Plan Note (Signed)
Persisted, moderate, continue Torsemide.

## 2020-05-24 NOTE — Code Documentation (Signed)
Stroke Response Nurse Documentation Code Documentation  Terri Acosta is a 86 y.o. female arriving to [place] ED via [facility] on 05/24/2020  with past medical hx of AFib, HRN, asthma . Code stroke was activated by EMS. Patient from facility where she was LKW at 1530 and now complaining of Right facial droop and slurred speech . On No antithrombotic. Stroke team at the bedside on patient arrival. Labs drawn and patient cleared for CT by Dr. Alvino Chapel. Patient to CT with team. NIHSS 4, see documentation for details and code stroke times. Patient with right facial droop, bilateral leg weakness and dysarthria  on exam. The following imaging was completed:  CT, CTA head and neck. Patient is not a candidate for tPA due to mild symptom Care/Plan. Bedside handoff with ED RN.  She is in the TPA window until 2000.  Neuro checks q 34min and VS q 15 min until 2000. RN to notify MD if she worsens.  Raliegh Ip  Stroke Response RN

## 2020-05-24 NOTE — Assessment & Plan Note (Addendum)
HR 116bpm, takes Metoprolol, Diltiazem, off Eliquis.

## 2020-05-24 NOTE — ED Provider Notes (Signed)
[facility] EMERGENCY DEPARTMENT Provider Note   CSN: [id number] Arrival date & time: 05/24/20  1709  An emergency department physician performed an initial assessment on this suspected stroke patient at 1709.  History Chief Complaint  Patient presents with  . Stroke S/S    Terri Acosta is a 86 y.o. female.  HPI Patient presents as a code stroke.  Met by myself and Dr.Xu.  At the bridge.  Last normal at 330 today.  The advice of friends home and had difficulty speaking facial droop and difficulty walking/unsteadiness.  Reportedly walks with assistance at baseline.  Also reportedly hypotensive tachycardia and in A. fib RVR.  Symptoms reportedly improving and now having less difficulty speaking.    Past Medical History:  Diagnosis Date  . Abnormal uterine bleeding   . Actinic keratosis 02/13/2012  . Acute bronchospasm 10/31/2011  . Cancer ([place]) 04/2008   Skin cancer of low back [name], MD  . Cataract   . Disorder of bone and cartilage, unspecified 02/18/2007  . Dizziness and giddiness 08/30/2010  . Dysmenorrhea   . Edema 12/19/2011  . Endometriosis   . 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 Osteoarthrosis, unspecified whether generalized or localized, unspecified site 08/20/2006  . Osteoporosis   . Other abnormal blood chemistry 03/14/2011  . Other and unspecified hyperlipidemia 10/18/2010  . Palpitations 02/13/2012  . Postmenopausal bleeding 08/30/2010  . Shortness of breath 11/28/2011  . Supraventricular premature beats 05/23/2011  . Unspecified essential hypertension 08/20/2006    Patient Active Problem List   Diagnosis Date Noted  . Stroke-like symptom 05/24/2020  . Dry eyes 05/21/2020  . Venous stasis ulcer of left lower leg with edema of left lower leg ([place]) 03/16/2020  . Anemia 03/08/2020  . Hypomagnesemia 02/27/2020  . Pressure ulcer 02/24/2020  . Insomnia secondary to depression with anxiety 02/06/2020  . Blepharitis of both eyes  02/06/2020  . Acute gastroenteritis 01/19/2020  . Diarrhea 01/18/2020  . Atrial fibrillation with rapid ventricular response ([place]) 01/18/2020  . Hypokalemia 01/18/2020  . Dehydration   . Vaginal atrophy 01/15/2019  . Cyst of right kidney 07/27/2017  . Knee pain, chronic 12/26/2016  . Unstable gait 07/04/2016  . COPD (chronic obstructive pulmonary disease) ([place]) 08/04/2014  . Palpitations 02/13/2012  . Edema 12/19/2011  . Hyperglycemia 03/14/2011  . Hyperlipidemia 10/18/2010  . Disorder of bone and cartilage 02/18/2007  . Essential hypertension 08/20/2006  . Osteoarthritis 08/20/2006    Past Surgical History:  Procedure Laterality Date  . CARPAL TUNNEL RELEASE  2004   S. Norris MD  . CATARACT EXTRACTION W/ INTRAOCULAR LENS IMPLANT Right 2010  . CATARACT EXTRACTION W/ INTRAOCULAR LENS IMPLANT Left 10/2012  . DILATION AND CURETTAGE OF UTERUS    . EYE SURGERY Right 07/2009   cataract extraction/IOLI [name], MD  . KNEE ARTHROSCOPY Right 2008   Torn meniscus, Dr. Veverly Fells     OB History    Gravida  3   Para  3   Term  3   Preterm      AB      Living  3     SAB      TAB      Ectopic      Multiple      Live Births  3           Family History  Problem Relation Age of Onset  . Diabetes Brother   . COPD Brother     Social History   Tobacco Use  .  Smoking status: Former Smoker    Quit date: 02/11/1978    Years since quitting: 42.3  . Smokeless tobacco: Never Used  Vaping Use  . Vaping Use: Never used  Substance Use Topics  . Alcohol use: Yes  . Drug use: No    Home Medications Prior to Admission medications   Medication Sig Start Date End Date Taking? Authorizing Provider  acetaminophen (TYLENOL) 325 MG tablet Take 650 mg by mouth every 4 (four) hours as needed. 05/22/20 05/24/20  [provider]  Amino Acids-Protein Hydrolys (FEEDING SUPPLEMENT, PRO-STAT SUGAR FREE 64,) LIQD Take 30 mLs by mouth 3 (three) times daily with meals.     [provider]  ARGININE PO Take by mouth. Arginaid Extra (whey protein-arginine-c-e-zinc) 6-4.5-250 gram-gram-mg liquid. Twice A Day Between Meals 246m, oral, Twice A Day Between Meals    [provider]  Dextran 70-Hypromellose (ARTIFICIAL TEARS) 0.1-0.3 % SOLN Apply 1 drop to eye in the morning, at noon, in the evening, and at bedtime. Both eyes    [provider]  diltiazem (CARDIZEM CD) 120 MG 24 hr capsule Take 1 capsule (120 mg total) by mouth daily. 01/25/20   AAline August MD  ferrous sulfate 325 (65 FE) MG tablet Take 325 mg by mouth. Once a day on Sunday, Tuesday, Thursday and Saturday    [provider]  loperamide (IMODIUM) 2 MG capsule Take 1 capsule (2 mg total) by mouth every 6 (six) hours as needed for diarrhea or loose stools. 01/25/20   AAline August MD  magnesium oxide (MAG-OX) 400 MG tablet Take 400 mg by mouth daily.    [provider]  metoprolol tartrate (LOPRESSOR) 50 MG tablet Take 75 mg by mouth 2 (two) times daily.    [provider]  mirtazapine (REMERON) 7.5 MG tablet Take 7.5 mg by mouth every other day.     [provider]  multivitamin-lutein (OCUVITE-LUTEIN) CAPS capsule Take 1 capsule by mouth in the morning and at bedtime.     [provider]  potassium chloride (KLOR-CON) 10 MEQ tablet Take 30 mEq by mouth daily.     [provider]  saccharomyces boulardii (FLORASTOR) 250 MG capsule Take 250 mg by mouth 2 (two) times daily.    [provider]  torsemide (DEMADEX) 20 MG tablet Take 20 mg by mouth every other day.     [provider]  triamcinolone cream (KENALOG) 0.1 % Apply 1 application topically every other day. Once a day. Apply to legs with esch dressing change.    [provider]  vitamin B-12 (CYANOCOBALAMIN) 1000 MCG tablet Take 1,000 mcg by mouth daily.    [provider]  zinc oxide 20 % ointment Apply 1 application topically as needed  for irritation.    [provider]    Allergies    Lomotil [diphenoxylate], Sulfa antibiotics, and Amlodipine  Review of Systems   Review of Systems  Constitutional: Negative for appetite change and fever.  Respiratory: Negative for shortness of breath.   Cardiovascular: Negative for chest pain.  Gastrointestinal: Negative for abdominal pain.  Musculoskeletal: Negative for back pain.  Neurological: Positive for speech difficulty and weakness.  Psychiatric/Behavioral: Negative for confusion.    Physical Exam Updated Vital Signs BP (!) 143/79   Pulse (!) 111   Temp 98.6 F (37 C) (Oral)   Resp 18   Ht '5\' 2"'  (1.575 m)   Wt 48.5 kg   SpO2 94%   BMI 19.56 kg/m  Physical Exam Vitals and nursing note reviewed.  HENT:     Head: Normocephalic.  Eyes:     Extraocular Movements: Extraocular movements intact.     Pupils: Pupils are equal, round, and reactive to light.  Cardiovascular:     Rate and Rhythm: Tachycardia present. Rhythm irregular.  Pulmonary:     Breath sounds: No wheezing or rhonchi.  Abdominal:     Tenderness: There is no abdominal tenderness.  Musculoskeletal:        General: No tenderness.  Skin:    General: Skin is warm.     Capillary Refill: Capillary refill takes less than 2 seconds.  Neurological:     Mental Status: She is alert.     Comments: Right-sided facial droop.  Good grip strength bilaterally.  Upper and lower extremity strength appears symmetric.  Eye movements intact.  Visual fields grossly intact.  Coherent speech.  Complete NIH scoring done by neurology.     ED Results / Procedures / Treatments   Labs (all labs ordered are listed, but only abnormal results are displayed) Labs Reviewed  CBC - Abnormal; Notable for the following components:      Result Value   WBC 11.7 (*)    Hemoglobin 10.3 (*)    HCT 35.1 (*)    MCH 25.9 (*)    MCHC 29.3 (*)    All other components within normal limits  DIFFERENTIAL - Abnormal; Notable  for the following components:   Neutro Abs 9.5 (*)    All other components within normal limits  COMPREHENSIVE METABOLIC PANEL - Abnormal; Notable for the following components:   Glucose, Bld 117 (*)    BUN 26 (*)    Calcium 8.8 (*)    Total Protein 6.3 (*)    Albumin 2.9 (*)    All other components within normal limits  URINALYSIS, ROUTINE W REFLEX MICROSCOPIC - Abnormal; Notable for the following components:   Color, Urine COLORLESS (*)    All other components within normal limits  I-STAT CHEM 8, ED - Abnormal; Notable for the following components:   BUN 29 (*)    Glucose, Bld 113 (*)    Calcium, Ion 1.06 (*)    Hemoglobin 11.2 (*)    HCT 33.0 (*)    All other components within normal limits  CBG MONITORING, ED - Abnormal; Notable for the following components:   Glucose-Capillary 116 (*)    All other components within normal limits  RESPIRATORY PANEL BY RT PCR (FLU A&B, COVID)  ETHANOL  PROTIME-INR  APTT  RAPID URINE DRUG SCREEN, HOSP PERFORMED    EKG EKG Interpretation  Date/Time:  Monday May 24 2020 17:55:23 EDT Ventricular Rate:  110 PR Interval:    QRS Duration: 77 QT Interval:  408 QTC Calculation: 552 R Axis:   75 Text Interpretation: Atrial fibrillation Paired ventricular premature complexes Prolonged QT interval Confirmed by Davonna Belling [phone]) on 05/24/2020 6:20:38 PM   Radiology CT ANGIO HEAD W OR WO CONTRAST  Result Date: 05/24/2020 CLINICAL DATA:  Left facial droop and slurred speech. Negative acute head CT. EXAM: CT ANGIOGRAPHY HEAD AND NECK TECHNIQUE: Multidetector CT imaging of the head and neck was performed using the standard protocol during bolus administration of intravenous contrast. Multiplanar CT image reconstructions and MIPs were obtained to evaluate the vascular anatomy. Carotid stenosis measurements (when applicable) are obtained utilizing NASCET criteria, using the distal internal carotid diameter as the denominator. CONTRAST:  70m  OMNIPAQUE IOHEXOL 350 MG/ML SOLN COMPARISON:  Head CT same day FINDINGS: CTA NECK FINDINGS Aortic arch: Aortic atherosclerotic calcification. Normal branching pattern without origin stenosis. Right carotid system: Common carotid artery widely patent to the bifurcation region. Calcified plaque at the carotid bifurcation and ICA bulb but no stenosis. Cervical ICA is tortuous but widely patent. Left carotid system: Common carotid artery widely patent to the bifurcation region. Calcified plaque at the carotid bifurcation and ICA bulb. Minimal diameter of the distal ICA bulb measures 3.2 mm. Compared to a more distal cervical ICA diameter of 5 mm, this indicates a 35% stenosis. Vertebral arteries: Both vertebral artery origins are patent. Some calcified plaque in the proximal right vertebral artery but without stenosis greater than 30%. Beyond that, both vertebral arteries widely patent through the cervical region to the foramen magnum. Skeleton: Chronic cervical spondylosis.  No acute finding. Other neck: No soft tissue mass or lymphadenopathy. Upper chest: Negative Review of the MIP images confirms the above findings CTA HEAD FINDINGS Anterior circulation: Both internal carotid arteries widely patent through the skull base and siphon regions. Ordinary siphon atherosclerotic calcification without stenosis. The anterior and middle cerebral vessels are patent. No large or medium vessel occlusion. No correctable proximal stenosis. Mild atherosclerotic irregularity of the more distal branch vessels. Posterior circulation: Both vertebral arteries widely patent to the basilar. No basilar stenosis. Posterior circulation branch vessels are patent. There is atherosclerotic narrowing and irregularity of the more distal PCA branches. Venous sinuses: Patent and normal. Anatomic variants: None significant. Review of the MIP images confirms the above findings IMPRESSION: 1. No large or medium vessel occlusion. 2. Aortic  atherosclerosis. 3. Atherosclerotic disease at both carotid bifurcations. 35% stenosis of the distal ICA bulb on the left. No measurable stenosis on the right. 4. Intracranial atherosclerotic narrowing and irregularity of the more distal branch vessels. 5. These results were communicated to Dr. Erlinda Hong At 5:40 pmon 10/4/2021by text page via the [facility] messaging system. Aortic Atherosclerosis (ICD10-I70.0). Electronically Signed   By: Nelson Chimes M.D.   On: 05/24/2020 17:40   CT ANGIO NECK W OR WO CONTRAST  Result Date: 05/24/2020 CLINICAL DATA:  Left facial droop and slurred speech. Negative acute head CT. EXAM: CT ANGIOGRAPHY HEAD AND NECK TECHNIQUE: Multidetector CT imaging of the head and neck was performed using the standard protocol during bolus administration of intravenous contrast. Multiplanar CT image reconstructions and MIPs were obtained to evaluate the vascular anatomy. Carotid stenosis measurements (when applicable) are obtained utilizing NASCET criteria, using the distal internal carotid diameter as the denominator. CONTRAST:  37m OMNIPAQUE IOHEXOL 350 MG/ML SOLN COMPARISON:  Head CT same day FINDINGS: CTA NECK FINDINGS Aortic arch: Aortic atherosclerotic calcification. Normal branching pattern without origin stenosis. Right carotid system: Common carotid artery widely patent to the bifurcation region. Calcified plaque at the carotid bifurcation and ICA bulb but no stenosis. Cervical ICA is tortuous but widely patent. Left carotid system: Common carotid artery widely patent to the bifurcation region. Calcified plaque at the carotid bifurcation and ICA bulb. Minimal diameter of the distal ICA bulb measures 3.2 mm. Compared to a more distal cervical ICA diameter of 5 mm, this indicates a 35% stenosis. Vertebral arteries: Both vertebral artery origins are patent. Some calcified plaque in the proximal right vertebral artery but without stenosis greater than 30%. Beyond that, both vertebral arteries widely  patent through the cervical region to the foramen magnum. Skeleton: Chronic cervical spondylosis.  No acute finding. Other neck: No soft tissue mass or lymphadenopathy. Upper chest: Negative Review of the MIP  images confirms the above findings CTA HEAD FINDINGS Anterior circulation: Both internal carotid arteries widely patent through the skull base and siphon regions. Ordinary siphon atherosclerotic calcification without stenosis. The anterior and middle cerebral vessels are patent. No large or medium vessel occlusion. No correctable proximal stenosis. Mild atherosclerotic irregularity of the more distal branch vessels. Posterior circulation: Both vertebral arteries widely patent to the basilar. No basilar stenosis. Posterior circulation branch vessels are patent. There is atherosclerotic narrowing and irregularity of the more distal PCA branches. Venous sinuses: Patent and normal. Anatomic variants: None significant. Review of the MIP images confirms the above findings IMPRESSION: 1. No large or medium vessel occlusion. 2. Aortic atherosclerosis. 3. Atherosclerotic disease at both carotid bifurcations. 35% stenosis of the distal ICA bulb on the left. No measurable stenosis on the right. 4. Intracranial atherosclerotic narrowing and irregularity of the more distal branch vessels. 5. These results were communicated to Dr. Erlinda Hong At 5:40 pmon 10/4/2021by text page via the [facility] messaging system. Aortic Atherosclerosis (ICD10-I70.0). Electronically Signed   By: Nelson Chimes M.D.   On: 05/24/2020 17:40   DG Chest Portable 1 View  Result Date: 05/24/2020 CLINICAL DATA:  Weakness. EXAM: PORTABLE CHEST 1 VIEW COMPARISON:  Jan 19, 2020 FINDINGS: There is cardiomegaly. There is a moderate-sized left-sided pleural effusion. There is a small right-sided pleural effusion. There is no pneumothorax. There is atelectasis at the lung bases. There are atherosclerotic changes of the thoracic aorta. There is no acute osseous  abnormality. IMPRESSION: 1. Cardiomegaly with bilateral pleural effusions, moderate on the left and small on the right. 2. Bibasilar atelectasis. Electronically Signed   By: Constance Holster M.D.   On: 05/24/2020 19:09   CT HEAD CODE STROKE WO CONTRAST  Result Date: 05/24/2020 CLINICAL DATA:  Code stroke.  Acute neurological deficit. EXAM: CT HEAD WITHOUT CONTRAST TECHNIQUE: Contiguous axial images were obtained from the base of the skull through the vertex without intravenous contrast. COMPARISON:  None. FINDINGS: Brain: Age related atrophy. Chronic small-vessel ischemic changes throughout the cerebral hemispheric white matter. No sign of acute infarction, mass lesion, hemorrhage, hydrocephalus or extra-axial collection. Vascular: There is atherosclerotic calcification of the major vessels at the base of the brain. Skull: Negative Sinuses/Orbits: Clear/normal Other: None ASPECTS ([place] Stroke Program Early CT Score) - Ganglionic level infarction (caudate, lentiform nuclei, internal capsule, insula, M1-M3 cortex): 7 - Supraganglionic infarction (M4-M6 cortex): 3 Total score (0-10 with 10 being normal): 10 IMPRESSION: 1. No acute finding by CT. Atrophy and chronic small-vessel ischemic changes. 2. ASPECTS is 10. 3. These results were communicated to Dr. Erlinda Hong At 5:23 pmon 10/4/2021by text page via the [facility] messaging system. Electronically Signed   By: Nelson Chimes M.D.   On: 05/24/2020 17:24    Procedures Procedures (including critical care time)  Medications Ordered in ED Medications  iohexol (OMNIPAQUE) 350 MG/ML injection 50 mL (50 mLs Intravenous Contrast Given 05/24/20 1733)    ED Course  I have reviewed the triage vital signs and the nursing notes.  Pertinent labs & imaging results that were available during my care of the patient were reviewed by me and considered in my medical decision making (see chart for details).    MDM Rules/Calculators/A&P                          Patient  presents as a code stroke.  However symptoms improved.  Not a TPA candidate due to improvement of symptoms.  Head CT reassuring.  Had previously been on Eliquis for A. fib but had taken herself off due to fall risk.  Seen by neurology.  However also found to be in A. fib with an RVR.  History of A. fib.  Not currently anticoagulated unsure of time of onset.  Not a cardioversion candidate.  CHA2DS2-VASc score of at least 5.  Will admit to hospitalist for further treatment.  CRITICAL CARE Performed by: Davonna Belling Total critical care time: 30 minutes Critical care time was exclusive of separately billable procedures and treating other patients. Critical care was necessary to treat or prevent imminent or life-threatening deterioration. Critical care was time spent personally by me on the following activities: development of treatment plan with patient and/or surrogate as well as nursing, discussions with consultants, evaluation of patient's response to treatment, examination of patient, obtaining history from patient or surrogate, ordering and performing treatments and interventions, ordering and review of laboratory studies, ordering and review of radiographic studies, pulse oximetry and re-evaluation of patient's condition.  Final Clinical Impression(s) / ED Diagnoses Final diagnoses:  TIA (transient ischemic attack)  Atrial fibrillation with rapid ventricular response [facility])    Rx / DC Orders ED Discharge Orders    None       Davonna Belling, MD 05/24/20 1947

## 2020-05-24 NOTE — Assessment & Plan Note (Signed)
Left lower leg open wounds developed from previous skin tears, healing nicely, f/u wound care center.

## 2020-05-24 NOTE — Consult Note (Signed)
Stroke Neurology Consultation Note  Consult Requested by: Dr. Alvino Chapel  Reason for Consult: code stroke  Consult Date: 05/24/20   The history was obtained from the EMS.  During history and examination, all items were able to obtain unless otherwise noted.  History of Present Illness:  Terri Acosta is a 86 y.o. Caucasian female with PMH of Afib RVR off eliquis, HTN, asthma, living in ALF presented to ED for right facial droop and speech difficulty. Per EMS, pt at facility last seen well at 15:30 then she was found to have right facial droop and slurry speech and difficulty speaking. No arm leg weakness. EMS called, on arrival, pt has right facial droop and also difficulty with word finding and difficulty walking from wheelchair to stretcher. En route, pt speech cleared and improved. Still has facial droop. Glucose 116. In Afib RVR with HR 130s.   Per EMS calling the facility, pt baseline wheelchair bound, however, able to walk briefly with assistance. She has been following with cardiology for afib, but self took off eliquis due to fear of falls. eliquis has been off since 02/2020.   LSN: [id number] tPA Given: No: non-disabling deficit. mRS = 4  Past Medical History:  Diagnosis Date  . Abnormal uterine bleeding   . Actinic keratosis 02/13/2012  . Acute bronchospasm 10/31/2011  . Cancer ([place]) 04/2008   Skin cancer of low back [name], MD  . Cataract   . Disorder of bone and cartilage, unspecified 02/18/2007  . Dizziness and giddiness 08/30/2010  . Dysmenorrhea   . Edema 12/19/2011  . Endometriosis   . 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 Osteoarthrosis, unspecified whether generalized or localized, unspecified site 08/20/2006  . Osteoporosis   . Other abnormal blood chemistry 03/14/2011  . Other and unspecified hyperlipidemia 10/18/2010  . Palpitations 02/13/2012  . Postmenopausal bleeding 08/30/2010  . Shortness of breath 11/28/2011  . Supraventricular premature beats  05/23/2011  . Unspecified essential hypertension 08/20/2006    Past Surgical History:  Procedure Laterality Date  . CARPAL TUNNEL RELEASE  2004   S. Norris MD  . CATARACT EXTRACTION W/ INTRAOCULAR LENS IMPLANT Right 2010  . CATARACT EXTRACTION W/ INTRAOCULAR LENS IMPLANT Left 10/2012  . DILATION AND CURETTAGE OF UTERUS    . EYE SURGERY Right 07/2009   cataract extraction/IOLI [name], MD  . KNEE ARTHROSCOPY Right 2008   Torn meniscus, Dr. Veverly Fells    Family History  Problem Relation Age of Onset  . Diabetes Brother   . COPD Brother     Social History:  reports that she quit smoking about 42 years ago. She has never used smokeless tobacco. She reports current alcohol use. She reports that she does not use drugs.  Allergies:  Allergies  Allergen Reactions  . Lomotil [Diphenoxylate] Nausea And Vomiting  . Sulfa Antibiotics Other (See Comments)    Unknown   . Amlodipine Swelling    No current facility-administered medications on file prior to encounter.   Current Outpatient Medications on File Prior to Encounter  Medication Sig Dispense Refill  . acetaminophen (TYLENOL) 325 MG tablet Take 650 mg by mouth every 4 (four) hours as needed.    . Amino Acids-Protein Hydrolys (FEEDING SUPPLEMENT, PRO-STAT SUGAR FREE 64,) LIQD Take 30 mLs by mouth 3 (three) times daily with meals.    . ARGININE PO Take by mouth. Arginaid Extra (whey protein-arginine-c-e-zinc) 6-4.5-250 gram-gram-mg liquid. Twice A Day Between Meals 25mL, oral, Twice A Day Between Meals    . Dextran 70-Hypromellose (ARTIFICIAL  TEARS) 0.1-0.3 % SOLN Apply 1 drop to eye in the morning, at noon, in the evening, and at bedtime. Both eyes    . diltiazem (CARDIZEM CD) 120 MG 24 hr capsule Take 1 capsule (120 mg total) by mouth daily. 30 capsule 0  . ferrous sulfate 325 (65 FE) MG tablet Take 325 mg by mouth. Once a day on Sunday, Tuesday, Thursday and Saturday    . loperamide (IMODIUM) 2 MG capsule Take 1 capsule (2 mg total)  by mouth every 6 (six) hours as needed for diarrhea or loose stools. 10 capsule 0  . magnesium oxide (MAG-OX) 400 MG tablet Take 400 mg by mouth daily.    . metoprolol tartrate (LOPRESSOR) 50 MG tablet Take 75 mg by mouth 2 (two) times daily.    . mirtazapine (REMERON) 7.5 MG tablet Take 7.5 mg by mouth every other day.     . multivitamin-lutein (OCUVITE-LUTEIN) CAPS capsule Take 1 capsule by mouth in the morning and at bedtime.     . potassium chloride (KLOR-CON) 10 MEQ tablet Take 30 mEq by mouth daily.     [name] saccharomyces boulardii (FLORASTOR) 250 MG capsule Take 250 mg by mouth 2 (two) times daily.    [name] torsemide (DEMADEX) 20 MG tablet Take 20 mg by mouth every other day.     . triamcinolone cream (KENALOG) 0.1 % Apply 1 application topically every other day. Once a day. Apply to legs with esch dressing change.    . vitamin B-12 (CYANOCOBALAMIN) 1000 MCG tablet Take 1,000 mcg by mouth daily.    [name] zinc oxide 20 % ointment Apply 1 application topically as needed for irritation.      Review of Systems: A full ROS was attempted today and was able to be performed.  Systems assessed include - Constitutional, Eyes, HENT, Respiratory, Cardiovascular, Gastrointestinal, Genitourinary, Integument/breast, Hematologic/lymphatic, Musculoskeletal, Neurological, Behavioral/Psych, Endocrine, Allergic/Immunologic - with pertinent responses as per HPI.  Physical Examination: Temp:  [98.2 F (36.8 C)] 98.2 F (36.8 C) (10/04 1617) Pulse Rate:  [95] 95 (10/04 1617) Resp:  [18] 18 (10/04 1617) BP: (134)/(95) 134/95 (10/04 1617) SpO2:  [99 %] 99 % (10/04 1617) Weight:  [47.2 kg] 47.2 kg (10/04 1617)  General - well nourished, well developed, in no apparent distress.    Ophthalmologic - fundi not visualized due to noncooperation.    Cardiovascular - irregularly irregular heart rate and rhythm.  Neuro - awake alert orientated to age, month and place, but not situation. No aphasia, able to name and repeat.  Following all simple commands but hard of hearing. Visual field full, no gaze deviation. Right nasolabial fold flattening. Tongue midline. PERRL. BUE 4/5 no drift. BLE 3/5 with mild drift. Sensation subjectively symmetrical. FTN grossly intact. Gait not tested.  NIH Stroke Scale  Level Of Consciousness 0=Alert; keenly responsive 1=Arouse to minor stimulation 2=Requires repeated stimulation to arouse or movements to pain 3=postures or unresponsive 0  LOC Questions to Month and Age 58=Answers both questions correctly 1=Answers one question correctly or dysarthria/intubated/trauma/language barrier 2=Answers neither question correctly or aphasia 0  LOC Commands      -Open/Close eyes     -Open/close grip     -Pantomime commands if communication barrier 0=Performs both tasks correctly 1=Performs one task correctly 2=Performs neighter task correctly 0  Best Gaze     -Only assess horizontal gaze 0=Normal 1=Partial gaze palsy 2=Forced deviation, or total gaze paresis 0  Visual 0=No visual loss 1=Partial hemianopia 2=Complete hemianopia 3=Bilateral hemianopia (blind including  cortical blindness) 0  Facial Palsy     -Use grimace if obtunded 0=Normal symmetrical movement 1=Minor paralysis (asymmetry) 2=Partial paralysis (lower face) 3=Complete paralysis (upper and lower face) 1  Motor  0=No drift for 10/5 seconds 1=Drift, but does not hit bed 2=Some antigravity effort, hits  bed 3=No effort against gravity, limb falls 4=No movement 0=Amputation/joint fusion Right Arm 0     Leg 0    Left Arm 1     Leg 1  Limb Ataxia     - FNT/HTS 0=Absent or does not understand or paralyzed or amputation/joint fusion 1=Present in one limb 2=Present in two limbs 0  Sensory 0=Normal 1=Mild to moderate sensory loss 2=Severe to total sensory loss or coma/unresponsive 0  Best Language 0=No aphasia, normal 1=Mild to moderate aphasia 2=Severe aphasia 3=Mute, global aphasia, or coma/unresponsive 0   Dysarthria 0=Normal 1=Mild to moderate 2=Severe, unintelligible or mute/anarthric 0=intubated/unable to test 1  Extinction/Neglect 0=No abnormality 1=visual/tactile/auditory/spatia/personal inattention/Extinction to bilateral simultaneous stimulation 2=Profound neglect/extinction more than 1 modality  0  Total   4     Data Reviewed: CT HEAD CODE STROKE WO CONTRAST  Result Date: 05/24/2020 CLINICAL DATA:  Code stroke.  Acute neurological deficit. EXAM: CT HEAD WITHOUT CONTRAST TECHNIQUE: Contiguous axial images were obtained from the base of the skull through the vertex without intravenous contrast. COMPARISON:  None. FINDINGS: Brain: Age related atrophy. Chronic small-vessel ischemic changes throughout the cerebral hemispheric white matter. No sign of acute infarction, mass lesion, hemorrhage, hydrocephalus or extra-axial collection. Vascular: There is atherosclerotic calcification of the major vessels at the base of the brain. Skull: Negative Sinuses/Orbits: Clear/normal Other: None ASPECTS ([facility] Stroke Program Early CT Score) - Ganglionic level infarction (caudate, lentiform nuclei, internal capsule, insula, M1-M3 cortex): 7 - Supraganglionic infarction (M4-M6 cortex): 3 Total score (0-10 with 10 being normal): 10 IMPRESSION: 1. No acute finding by CT. Atrophy and chronic small-vessel ischemic changes. 2. ASPECTS is 10. 3. These results were communicated to Dr. Erlinda Hong At 5:23 pmon 10/4/2021by text page via the [facility] messaging system. Electronically Signed   By: Nelson Chimes M.D.   On: 05/24/2020 17:24    Assessment: 86 y.o. female with PMH of Afib RVR off eliquis, HTN, asthma, living in ALF mRS=4 presented to ED for right facial droop and speech difficulty. Time onset 15:30. En route, pt speech cleared and improved. Still has facial droop. Glucose 116. In Afib RVR with HR 130s. CT no acute abnormality. NIHSS =4. Pt not tPA candidate due to nondisabling symptoms. No IR candidate due to high mRS  and low NIHSS. CTA head and neck pending. Recommend to admit to hospitalist service and we will continue to follow. Recommend stroke work up with MRI and TTE. Will discuss about anticoagulation after MRI. Stat ASA.   Stroke Risk Factors - atrial fibrillation and hypertension  Plan: - Admission for  further stroke work up  - Frequent neuro checks - Telemetry monitoring - MRI brain  - Echocardiogram  - fasting lipid panel and HgbA1C - PT/OT/speech consult - Permissive hypertension (only treat if BP > 220/120 unless a lower blood pressure is clinically necessary) for 24-48 hours post stroke onset - GI and DVT prophylaxis  - Stat ASA PR 300 mg if no po access or ASA  325 mg if passed swallow screening - Will revisit anticoagulation once MRI done - Stroke risk factor modification - Discussed with Dr. Alvino Chapel ED physician - We will follow  Thank you for this consultation and allowing [place] to  participate in the care of this patient.  Rosalin Hawking, MD PhD Stroke Neurology 05/24/2020 5:42 PM

## 2020-05-24 NOTE — Assessment & Plan Note (Signed)
Sacral pressure ulcer,f/u atwound care center, covered in dressing.

## 2020-05-24 NOTE — H&P (Signed)
History and Physical    NARELY NOBLES QMG:[id number] DOB: [birth year] DOA: 05/24/2020  PCP: [name], [name], NP  Patient coming from: Assisted living facility.  Chief Complaint: Slurred speech and right facial droop.  HPI: Terri DUNKEL is a 86 y.o. female with history of A. fib presently off Eliquis since patient was concerned about falls, hypertension, anemia was brought to the ER after patient was found to have slurred speech and right facial droop.  Patient last known normal was around 3:30 PM today.  Did not have any difficulty with moving upper or lower extremities.  ED Course: In the ER patient was found to have right facial droop and slurred speech.  CT angiogram of the head and neck did not show any large vessel obstruction.  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  On-call neurology has been consulted.  Patient initially was in A. fib with RVR improved subsequently after taking home medications.  Labs are significant for WBC of 11.7 hemoglobin 10.3 Covid test is negative.  Review of Systems: As per HPI, rest all negative.   Past Medical History:  Diagnosis Date  . Abnormal uterine bleeding   . Actinic keratosis 02/13/2012  . Acute bronchospasm 10/31/2011  . Cancer ([place]) 04/2008   Skin cancer of low back [name], MD  . Cataract   . Disorder of bone and cartilage, unspecified 02/18/2007  . Dizziness and giddiness 08/30/2010  . Dysmenorrhea   . Edema 12/19/2011  . Endometriosis   . 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 Osteoarthrosis, unspecified whether generalized or localized, unspecified site 08/20/2006  . Osteoporosis   . Other abnormal blood chemistry 03/14/2011  . Other and unspecified hyperlipidemia 10/18/2010  . Palpitations 02/13/2012  . Postmenopausal bleeding 08/30/2010  . Shortness of breath 11/28/2011  . Supraventricular premature beats 05/23/2011  . Unspecified essential hypertension 08/20/2006    Past Surgical History:  Procedure Laterality Date  .  CARPAL TUNNEL RELEASE  2004   S. Norris MD  . CATARACT EXTRACTION W/ INTRAOCULAR LENS IMPLANT Right 2010  . CATARACT EXTRACTION W/ INTRAOCULAR LENS IMPLANT Left 10/2012  . DILATION AND CURETTAGE OF UTERUS    . EYE SURGERY Right 07/2009   cataract extraction/IOLI [name], MD  . KNEE ARTHROSCOPY Right 2008   Torn meniscus, Dr. Veverly Fells     reports that she quit smoking about 42 years ago. She has never used smokeless tobacco. She reports current alcohol use. She reports that she does not use drugs.  Allergies  Allergen Reactions  . Lomotil [Diphenoxylate] Nausea And Vomiting  . Sulfa Antibiotics Other (See Comments)    Pt unknown   . Amlodipine Swelling    Family History  Problem Relation Age of Onset  . Diabetes Brother   . COPD Brother     Prior to Admission medications   Medication Sig Start Date End Date Taking? Authorizing Provider  acetaminophen (TYLENOL) 325 MG tablet Take 650 mg by mouth every 4 (four) hours as needed for mild pain or headache.  05/22/20 05/24/20 Yes [provider]  Amino Acids-Protein Hydrolys (FEEDING SUPPLEMENT, PRO-STAT SUGAR FREE 64,) LIQD Take 30 mLs by mouth 2 (two) times daily with a meal.    Yes [provider]  ARGININE PO Take by mouth. Arginaid Extra (whey protein-arginine-c-e-zinc) 6-4.5-250 gram-gram-mg liquid. Twice A Day Between Meals 250mL, oral, Twice A Day Between Meals   Yes [provider]  Dextran 70-Hypromellose (ARTIFICIAL TEARS) 0.1-0.3 % SOLN Apply 1 drop to eye in the morning,  at noon, in the evening, and at bedtime. Both eyes   Yes [provider]  diltiazem (CARDIZEM CD) 120 MG 24 hr capsule Take 1 capsule (120 mg total) by mouth daily. 01/25/20  Yes Aline August, MD  ferrous sulfate 325 (65 FE) MG tablet Take 325 mg by mouth. Once a day on Sunday, Tuesday, Thursday and Saturday   Yes [provider]  loperamide (IMODIUM) 2 MG capsule Take 1 capsule (2 mg total) by mouth every 6 (six) hours  as needed for diarrhea or loose stools. 01/25/20  Yes Aline August, MD  magnesium oxide (MAG-OX) 400 MG tablet Take 400 mg by mouth daily.   Yes [provider]  metoprolol tartrate (LOPRESSOR) 50 MG tablet Take 75 mg by mouth 2 (two) times daily.   Yes [provider]  mirtazapine (REMERON) 7.5 MG tablet Take 7.5 mg by mouth every other day.    Yes [provider]  multivitamin-lutein (OCUVITE-LUTEIN) CAPS capsule Take 1 capsule by mouth in the morning and at bedtime.    Yes [provider]  Nutritional Supplements (ARGINAID PO) Take 237 mLs by mouth 2 (two) times daily.   Yes [provider]  potassium chloride (KLOR-CON) 10 MEQ tablet Take 30 mEq by mouth daily.    Yes [provider]  saccharomyces boulardii (FLORASTOR) 250 MG capsule Take 250 mg by mouth 2 (two) times daily.   Yes [provider]  torsemide (DEMADEX) 20 MG tablet Take 20 mg by mouth every other day.    Yes [provider]  triamcinolone cream (KENALOG) 0.1 % Apply 1 application topically every other day. Once a day. Apply to legs with esch dressing change.   Yes [provider]  vitamin B-12 (CYANOCOBALAMIN) 1000 MCG tablet Take 1,000 mcg by mouth daily.   Yes [provider]  zinc oxide 20 % ointment Apply 1 application topically as needed for irritation.   Yes [provider]    Physical Exam: Constitutional: Moderately built and nourished. Vitals:   05/24/20 2045 05/24/20 2100 05/24/20 2115 05/24/20 2130  BP: (!) 135/97 (!) 148/95 (!) 146/95 136/78  Pulse: (!) 102 99 90 72  Resp: 18 19 15 19   Temp:      TempSrc:      SpO2: 95% 97% 100% 97%  Weight:      Height:       Eyes: Anicteric no pallor. ENMT: No discharge from the ears eyes nose or mouth. Neck: No mass felt.  No neck rigidity. Respiratory: No rhonchi or crepitations. Cardiovascular: S1-S2 heard. Abdomen: Soft nontender bowel sounds  present. Musculoskeletal: No edema. Skin: No rash. Neurologic: Patient is alert awake oriented to name presently sleeping.  Moving all extremities.  Right facial droop. Psychiatric: Oriented to name.   Labs on Admission: I have personally reviewed following labs and imaging studies  CBC: Recent Labs  Lab 05/24/20 1741 05/24/20 1756  WBC 11.7*  --   NEUTROABS 9.5*  --   HGB 10.3* 11.2*  HCT 35.1* 33.0*  MCV 88.4  --   PLT 265  --    Basic Metabolic Panel: Recent Labs  Lab 05/24/20 1741 05/24/20 1756  NA 143 142  K 3.6 3.5  CL 102 102  CO2 29  --   GLUCOSE 117* 113*  BUN 26* 29*  CREATININE 0.68 0.60  CALCIUM 8.8*  --    GFR: Estimated Creatinine Clearance: 32.2 mL/min (by C-G formula based on SCr of 0.6 mg/dL). Liver Function  Tests: Recent Labs  Lab 05/24/20 1741  AST 20  ALT 17  ALKPHOS 63  BILITOT 0.7  PROT 6.3*  ALBUMIN 2.9*   No results for input(s): LIPASE, AMYLASE in the last 168 hours. No results for input(s): AMMONIA in the last 168 hours. Coagulation Profile: Recent Labs  Lab 05/24/20 1741  INR 1.2   Cardiac Enzymes: No results for input(s): CKTOTAL, CKMB, CKMBINDEX, TROPONINI in the last 168 hours. BNP (last 3 results) No results for input(s): PROBNP in the last 8760 hours. HbA1C: No results for input(s): HGBA1C in the last 72 hours. CBG: Recent Labs  Lab 05/24/20 1712  GLUCAP 116*   Lipid Profile: No results for input(s): CHOL, HDL, LDLCALC, TRIG, CHOLHDL, LDLDIRECT in the last 72 hours. Thyroid Function Tests: No results for input(s): TSH, T4TOTAL, FREET4, T3FREE, THYROIDAB in the last 72 hours. Anemia Panel: No results for input(s): VITAMINB12, FOLATE, FERRITIN, TIBC, IRON, RETICCTPCT in the last 72 hours. Urine analysis:    Component Value Date/Time   COLORURINE COLORLESS (A) 05/24/2020 1842   APPEARANCEUR CLEAR 05/24/2020 1842   LABSPEC 1.012 05/24/2020 1842   PHURINE 5.0 05/24/2020 1842   GLUCOSEU NEGATIVE 05/24/2020 1842    HGBUR NEGATIVE 05/24/2020 1842   BILIRUBINUR NEGATIVE 05/24/2020 1842   BILIRUBINUR negative 07/02/2018 1700   KETONESUR NEGATIVE 05/24/2020 1842   PROTEINUR NEGATIVE 05/24/2020 1842   UROBILINOGEN 0.2 07/02/2018 1700   NITRITE NEGATIVE 05/24/2020 1842   LEUKOCYTESUR NEGATIVE 05/24/2020 1842   Sepsis Labs: @LABRCNTIP (procalcitonin:4,lacticidven:4) ) Recent Results (from the past 240 hour(s))  Respiratory Panel by RT PCR (Flu A&B, Covid) - Urine, Clean Catch     Status: None   Collection Time: 05/24/20  6:42 PM   Specimen: Urine, Clean Catch; Nasopharyngeal  Result Value Ref Range Status   SARS Coronavirus 2 by RT PCR NEGATIVE NEGATIVE Final    Comment: (NOTE) SARS-CoV-2 target nucleic acids are NOT DETECTED.  The SARS-CoV-2 RNA is generally detectable in upper respiratoy specimens during the acute phase of infection. The lowest concentration of SARS-CoV-2 viral copies this assay can detect is 131 copies/mL. A negative result does not preclude SARS-Cov-2 infection and should not be used as the sole basis for treatment or other patient management decisions. A negative result may occur with  improper specimen collection/handling, submission of specimen other than nasopharyngeal swab, presence of viral mutation(s) within the areas targeted by this assay, and inadequate number of viral copies (<131 copies/mL). A negative result must be combined with clinical observations, patient history, and epidemiological information. The expected result is Negative.  Fact Sheet for Patients:  PinkCheek.be  Fact Sheet for Healthcare Providers:  GravelBags.it  This test is no t yet approved or cleared by the [place] FDA and  has been authorized for detection and/or diagnosis of SARS-CoV-2 by FDA under an Emergency Use Authorization (EUA). This EUA will remain  in effect (meaning this test can be used) for the duration of  the COVID-19 declaration under Section 564(b)(1) of the Act, 21 U.S.C. section 360bbb-3(b)(1), unless the authorization is terminated or revoked sooner.     Influenza A by PCR NEGATIVE NEGATIVE Final   Influenza B by PCR NEGATIVE NEGATIVE Final    Comment: (NOTE) The Xpert Xpress SARS-CoV-2/FLU/RSV assay is intended as an aid in  the diagnosis of influenza from Nasopharyngeal swab specimens and  should not be used as a sole basis for treatment. Nasal washings and  aspirates are unacceptable for Xpert Xpress SARS-CoV-2/FLU/RSV  testing.  Fact Sheet for  Patients: PinkCheek.be  Fact Sheet for Healthcare Providers: GravelBags.it  This test is not yet approved or cleared by the [place] FDA and  has been authorized for detection and/or diagnosis of SARS-CoV-2 by  FDA under an Emergency Use Authorization (EUA). This EUA will remain  in effect (meaning this test can be used) for the duration of the  Covid-19 declaration under Section 564(b)(1) of the Act, 21  U.S.C. section 360bbb-3(b)(1), unless the authorization is  terminated or revoked. Performed at [facility], [place] [address], [place], [place] [address]      Radiological Exams on Admission: CT ANGIO HEAD W OR WO CONTRAST  Result Date: 05/24/2020 CLINICAL DATA:  Left facial droop and slurred speech. Negative acute head CT. EXAM: CT ANGIOGRAPHY HEAD AND NECK TECHNIQUE: Multidetector CT imaging of the head and neck was performed using the standard protocol during bolus administration of intravenous contrast. Multiplanar CT image reconstructions and MIPs were obtained to evaluate the vascular anatomy. Carotid stenosis measurements (when applicable) are obtained utilizing NASCET criteria, using the distal internal carotid diameter as the denominator. CONTRAST:  28mL OMNIPAQUE IOHEXOL 350 MG/ML SOLN COMPARISON:  Head CT same day FINDINGS: CTA NECK FINDINGS Aortic arch:  Aortic atherosclerotic calcification. Normal branching pattern without origin stenosis. Right carotid system: Common carotid artery widely patent to the bifurcation region. Calcified plaque at the carotid bifurcation and ICA bulb but no stenosis. Cervical ICA is tortuous but widely patent. Left carotid system: Common carotid artery widely patent to the bifurcation region. Calcified plaque at the carotid bifurcation and ICA bulb. Minimal diameter of the distal ICA bulb measures 3.2 mm. Compared to a more distal cervical ICA diameter of 5 mm, this indicates a 35% stenosis. Vertebral arteries: Both vertebral artery origins are patent. Some calcified plaque in the proximal right vertebral artery but without stenosis greater than 30%. Beyond that, both vertebral arteries widely patent through the cervical region to the foramen magnum. Skeleton: Chronic cervical spondylosis.  No acute finding. Other neck: No soft tissue mass or lymphadenopathy. Upper chest: Negative Review of the MIP images confirms the above findings CTA HEAD FINDINGS Anterior circulation: Both internal carotid arteries widely patent through the skull base and siphon regions. Ordinary siphon atherosclerotic calcification without stenosis. The anterior and middle cerebral vessels are patent. No large or medium vessel occlusion. No correctable proximal stenosis. Mild atherosclerotic irregularity of the more distal branch vessels. Posterior circulation: Both vertebral arteries widely patent to the basilar. No basilar stenosis. Posterior circulation branch vessels are patent. There is atherosclerotic narrowing and irregularity of the more distal PCA branches. Venous sinuses: Patent and normal. Anatomic variants: None significant. Review of the MIP images confirms the above findings IMPRESSION: 1. No large or medium vessel occlusion. 2. Aortic atherosclerosis. 3. Atherosclerotic disease at both carotid bifurcations. 35% stenosis of the distal ICA bulb on the  left. No measurable stenosis on the right. 4. Intracranial atherosclerotic narrowing and irregularity of the more distal branch vessels. 5. These results were communicated to Dr. Erlinda Hong At 5:40 pmon 10/4/2021by text page via the [employer] messaging system. Aortic Atherosclerosis (ICD10-I70.0). Electronically Signed   By: Nelson Chimes M.D.   On: 05/24/2020 17:40   CT ANGIO NECK W OR WO CONTRAST  Result Date: 05/24/2020 CLINICAL DATA:  Left facial droop and slurred speech. Negative acute head CT. EXAM: CT ANGIOGRAPHY HEAD AND NECK TECHNIQUE: Multidetector CT imaging of the head and neck was performed using the standard protocol during bolus administration of intravenous contrast. Multiplanar CT  image reconstructions and MIPs were obtained to evaluate the vascular anatomy. Carotid stenosis measurements (when applicable) are obtained utilizing NASCET criteria, using the distal internal carotid diameter as the denominator. CONTRAST:  41mL OMNIPAQUE IOHEXOL 350 MG/ML SOLN COMPARISON:  Head CT same day FINDINGS: CTA NECK FINDINGS Aortic arch: Aortic atherosclerotic calcification. Normal branching pattern without origin stenosis. Right carotid system: Common carotid artery widely patent to the bifurcation region. Calcified plaque at the carotid bifurcation and ICA bulb but no stenosis. Cervical ICA is tortuous but widely patent. Left carotid system: Common carotid artery widely patent to the bifurcation region. Calcified plaque at the carotid bifurcation and ICA bulb. Minimal diameter of the distal ICA bulb measures 3.2 mm. Compared to a more distal cervical ICA diameter of 5 mm, this indicates a 35% stenosis. Vertebral arteries: Both vertebral artery origins are patent. Some calcified plaque in the proximal right vertebral artery but without stenosis greater than 30%. Beyond that, both vertebral arteries widely patent through the cervical region to the foramen magnum. Skeleton: Chronic cervical spondylosis.  No acute finding.  Other neck: No soft tissue mass or lymphadenopathy. Upper chest: Negative Review of the MIP images confirms the above findings CTA HEAD FINDINGS Anterior circulation: Both internal carotid arteries widely patent through the skull base and siphon regions. Ordinary siphon atherosclerotic calcification without stenosis. The anterior and middle cerebral vessels are patent. No large or medium vessel occlusion. No correctable proximal stenosis. Mild atherosclerotic irregularity of the more distal branch vessels. Posterior circulation: Both vertebral arteries widely patent to the basilar. No basilar stenosis. Posterior circulation branch vessels are patent. There is atherosclerotic narrowing and irregularity of the more distal PCA branches. Venous sinuses: Patent and normal. Anatomic variants: None significant. Review of the MIP images confirms the above findings IMPRESSION: 1. No large or medium vessel occlusion. 2. Aortic atherosclerosis. 3. Atherosclerotic disease at both carotid bifurcations. 35% stenosis of the distal ICA bulb on the left. No measurable stenosis on the right. 4. Intracranial atherosclerotic narrowing and irregularity of the more distal branch vessels. 5. These results were communicated to Dr. Erlinda Hong At 5:40 pmon 10/4/2021by text page via the [facility] messaging system. Aortic Atherosclerosis (ICD10-I70.0). Electronically Signed   By: Nelson Chimes M.D.   On: 05/24/2020 17:40   DG Chest Portable 1 View  Result Date: 05/24/2020 CLINICAL DATA:  Weakness. EXAM: PORTABLE CHEST 1 VIEW COMPARISON:  Jan 19, 2020 FINDINGS: There is cardiomegaly. There is a moderate-sized left-sided pleural effusion. There is a small right-sided pleural effusion. There is no pneumothorax. There is atelectasis at the lung bases. There are atherosclerotic changes of the thoracic aorta. There is no acute osseous abnormality. IMPRESSION: 1. Cardiomegaly with bilateral pleural effusions, moderate on the left and small on the right. 2.  Bibasilar atelectasis. Electronically Signed   By: Constance Holster M.D.   On: 05/24/2020 19:09   CT HEAD CODE STROKE WO CONTRAST  Result Date: 05/24/2020 CLINICAL DATA:  Code stroke.  Acute neurological deficit. EXAM: CT HEAD WITHOUT CONTRAST TECHNIQUE: Contiguous axial images were obtained from the base of the skull through the vertex without intravenous contrast. COMPARISON:  None. FINDINGS: Brain: Age related atrophy. Chronic small-vessel ischemic changes throughout the cerebral hemispheric white matter. No sign of acute infarction, mass lesion, hemorrhage, hydrocephalus or extra-axial collection. Vascular: There is atherosclerotic calcification of the major vessels at the base of the brain. Skull: Negative Sinuses/Orbits: Clear/normal Other: None ASPECTS (Rafael Gonzalez Stroke Program Early CT Score) - Ganglionic level infarction (caudate, lentiform nuclei, internal capsule, insula,  M1-M3 cortex): 7 - Supraganglionic infarction (M4-M6 cortex): 3 Total score (0-10 with 10 being normal): 10 IMPRESSION: 1. No acute finding by CT. Atrophy and chronic small-vessel ischemic changes. 2. ASPECTS is 10. 3. These results were communicated to Dr. Erlinda Hong At 5:23 pmon 10/4/2021by text page via the [facility] messaging system. Electronically Signed   By: Nelson Chimes M.D.   On: 05/24/2020 17:24    EKG: Independently reviewed.  A. fib with RVR.  Assessment/Plan Principal Problem:   TIA (transient ischemic attack) Active Problems:   Essential hypertension   COPD (chronic obstructive pulmonary disease) (HCC)   Atrial fibrillation with rapid ventricular response ([place])    1. Acute CVA -appreciate neurology consult.  Patient is presently on aspirin.  Patient did pass swallow.  We will keep patient on neurochecks physical therapy consult check hemoglobin A1c lipid panel 2D echo and further recommendations per neurology with regard to anticoagulation. 2. A. fib with RVR improved with taking beta-blockers.  Not on Eliquis since  patient discontinued for the fear of falling.  Presently on aspirin.  Neurology will be giving further recommendations on anticoagulation.  Check TSH. 3. Anemia follow CBC. 4. Hypertension allow for permissive hypertension. 5. COPD not actively wheezing.   DVT prophylaxis: Lovenox. Code Status: DNR. Family Communication: We will need to discuss with family. Disposition Plan: Back to living facility when stable. Consults called: Neurology. Admission status: Observation.   Rise Patience MD [facility] Pager [phone].  If 7PM-7AM, please contact night-coverage [email or web address] Password [email or web address]  05/24/2020, 9:53 PM

## 2020-05-24 NOTE — Assessment & Plan Note (Signed)
Stable weight, mood, sleep, continue Mirtazapine.

## 2020-05-25 ENCOUNTER — Encounter: Payer: Self-pay | Admitting: Nurse Practitioner

## 2020-05-25 ENCOUNTER — Observation Stay (HOSPITAL_BASED_OUTPATIENT_CLINIC_OR_DEPARTMENT_OTHER): Payer: Medicare Other

## 2020-05-25 DIAGNOSIS — E785 Hyperlipidemia, unspecified: Secondary | ICD-10-CM | POA: Diagnosis present

## 2020-05-25 DIAGNOSIS — Z66 Do not resuscitate: Secondary | ICD-10-CM | POA: Diagnosis present

## 2020-05-25 DIAGNOSIS — I5032 Chronic diastolic (congestive) heart failure: Secondary | ICD-10-CM | POA: Diagnosis present

## 2020-05-25 DIAGNOSIS — I63512 Cerebral infarction due to unspecified occlusion or stenosis of left middle cerebral artery: Secondary | ICD-10-CM | POA: Diagnosis present

## 2020-05-25 DIAGNOSIS — Z79899 Other long term (current) drug therapy: Secondary | ICD-10-CM | POA: Diagnosis not present

## 2020-05-25 DIAGNOSIS — L97829 Non-pressure chronic ulcer of other part of left lower leg with unspecified severity: Secondary | ICD-10-CM | POA: Diagnosis present

## 2020-05-25 DIAGNOSIS — D649 Anemia, unspecified: Secondary | ICD-10-CM | POA: Diagnosis present

## 2020-05-25 DIAGNOSIS — G459 Transient cerebral ischemic attack, unspecified: Secondary | ICD-10-CM | POA: Diagnosis present

## 2020-05-25 DIAGNOSIS — Z20822 Contact with and (suspected) exposure to covid-19: Secondary | ICD-10-CM | POA: Diagnosis present

## 2020-05-25 DIAGNOSIS — I361 Nonrheumatic tricuspid (valve) insufficiency: Secondary | ICD-10-CM

## 2020-05-25 DIAGNOSIS — I6389 Other cerebral infarction: Secondary | ICD-10-CM | POA: Diagnosis not present

## 2020-05-25 DIAGNOSIS — Z8673 Personal history of transient ischemic attack (TIA), and cerebral infarction without residual deficits: Secondary | ICD-10-CM | POA: Diagnosis present

## 2020-05-25 DIAGNOSIS — I110 Hypertensive heart disease with heart failure: Secondary | ICD-10-CM | POA: Diagnosis present

## 2020-05-25 DIAGNOSIS — I480 Paroxysmal atrial fibrillation: Secondary | ICD-10-CM | POA: Diagnosis present

## 2020-05-25 DIAGNOSIS — Z23 Encounter for immunization: Secondary | ICD-10-CM | POA: Diagnosis present

## 2020-05-25 DIAGNOSIS — I83028 Varicose veins of left lower extremity with ulcer other part of lower leg: Secondary | ICD-10-CM | POA: Diagnosis present

## 2020-05-25 DIAGNOSIS — L89154 Pressure ulcer of sacral region, stage 4: Secondary | ICD-10-CM | POA: Diagnosis present

## 2020-05-25 DIAGNOSIS — R4781 Slurred speech: Secondary | ICD-10-CM | POA: Diagnosis present

## 2020-05-25 DIAGNOSIS — I10 Essential (primary) hypertension: Secondary | ICD-10-CM | POA: Diagnosis not present

## 2020-05-25 DIAGNOSIS — I4891 Unspecified atrial fibrillation: Secondary | ICD-10-CM | POA: Diagnosis not present

## 2020-05-25 DIAGNOSIS — I639 Cerebral infarction, unspecified: Secondary | ICD-10-CM

## 2020-05-25 DIAGNOSIS — M810 Age-related osteoporosis without current pathological fracture: Secondary | ICD-10-CM | POA: Diagnosis present

## 2020-05-25 DIAGNOSIS — R29810 Facial weakness: Secondary | ICD-10-CM | POA: Diagnosis present

## 2020-05-25 DIAGNOSIS — L97819 Non-pressure chronic ulcer of other part of right lower leg with unspecified severity: Secondary | ICD-10-CM | POA: Diagnosis present

## 2020-05-25 DIAGNOSIS — Z85828 Personal history of other malignant neoplasm of skin: Secondary | ICD-10-CM | POA: Diagnosis not present

## 2020-05-25 DIAGNOSIS — I340 Nonrheumatic mitral (valve) insufficiency: Secondary | ICD-10-CM | POA: Diagnosis not present

## 2020-05-25 DIAGNOSIS — Z7982 Long term (current) use of aspirin: Secondary | ICD-10-CM | POA: Diagnosis not present

## 2020-05-25 DIAGNOSIS — Z993 Dependence on wheelchair: Secondary | ICD-10-CM | POA: Diagnosis not present

## 2020-05-25 DIAGNOSIS — I83018 Varicose veins of right lower extremity with ulcer other part of lower leg: Secondary | ICD-10-CM | POA: Diagnosis present

## 2020-05-25 DIAGNOSIS — Z87891 Personal history of nicotine dependence: Secondary | ICD-10-CM | POA: Diagnosis not present

## 2020-05-25 DIAGNOSIS — J449 Chronic obstructive pulmonary disease, unspecified: Secondary | ICD-10-CM | POA: Diagnosis present

## 2020-05-25 DIAGNOSIS — R29704 NIHSS score 4: Secondary | ICD-10-CM | POA: Diagnosis present

## 2020-05-25 LAB — LIPID PANEL
Cholesterol: 133 mg/dL (ref 0–200)
HDL: 39 mg/dL — ABNORMAL LOW (ref >40)
LDL Cholesterol: 79 mg/dL (ref 0–99)
Total CHOL/HDL Ratio: 3.4 RATIO
Triglycerides: 73 mg/dL (ref <150)
VLDL: 15 mg/dL (ref 0–40)

## 2020-05-25 LAB — ECHOCARDIOGRAM COMPLETE
Area-P 1/2: 9.6 cm2
Calc EF: 52.7 %
Height: 62 in
MV M vel: 5.57 m/s
MV Peak grad: 124.1 mmHg
P 1/2 time: 288 msec
Radius: 0.25 cm
S' Lateral: 2.62 cm
Single Plane A2C EF: 55.7 %
Single Plane A4C EF: 51.7 %
Weight: 1710.77 oz

## 2020-05-25 LAB — TSH: TSH: 2.095 u[IU]/mL (ref 0.350–4.500)

## 2020-05-25 LAB — HEMOGLOBIN A1C
Hgb A1c MFr Bld: 5.4 % (ref 4.8–5.6)
Mean Plasma Glucose: 108.28 mg/dL

## 2020-05-25 MED ORDER — APIXABAN 2.5 MG PO TABS: 2.5000 mg | ORAL_TABLET | Freq: Two times a day (BID) | ORAL | Status: DC

## 2020-05-25 MED ORDER — ATORVASTATIN CALCIUM 10 MG PO TABS
20.0000 mg | ORAL_TABLET | Freq: Every day | ORAL | Status: DC
  Administered 2020-05-25 – 2020-05-26 (×2): 20 mg via ORAL
  Filled 2020-05-25 (×2): qty 2

## 2020-05-25 MED ORDER — PROSOURCE PLUS PO LIQD
30.0000 mL | Freq: Two times a day (BID) | ORAL | Status: DC
  Administered 2020-05-25 – 2020-05-26 (×3): 30 mL via ORAL
  Filled 2020-05-25 (×4): qty 30

## 2020-05-25 NOTE — Progress Notes (Signed)
°  Echocardiogram 2D Echocardiogram has been performed.  Fidel Levy 05/25/2020, 11:39 AM

## 2020-05-25 NOTE — Progress Notes (Signed)
STROKE TEAM PROGRESS NOTE   SUBJECTIVE (INTERVAL HISTORY) No family is at the bedside.  Overall her condition is stable. Pt lying in bed, stated that she felt her speech is better. Indeed, her right facial droop has resolved. MRI showed right frontal lobe small patchy infarcts. Discussed with pt about eliquis, pt stated she never stopped any medication by herself and she would like to restart eliquis. I will recommend to start from tomorrow evening to give 48 hours post stroke.   OBJECTIVE Temp:  [98.5 F (36.9 C)-98.6 F (37 C)] 98.5 F (36.9 C) (10/05 1215) Pulse Rate:  [46-137] 89 (10/05 1600) Cardiac Rhythm: Atrial fibrillation (10/04 1840) Resp:  [14-38] 16 (10/05 1600) BP: (111-151)/(58-109) 127/79 (10/05 1600) SpO2:  [85 %-100 %] 93 % (10/05 1445)  Recent Labs  Lab 05/24/20 1712  GLUCAP 116*   Recent Labs  Lab 05/24/20 1741 05/24/20 1756  NA 143 142  K 3.6 3.5  CL 102 102  CO2 29  --   GLUCOSE 117* 113*  BUN 26* 29*  CREATININE 0.68 0.60  CALCIUM 8.8*  --    Recent Labs  Lab 05/24/20 1741  AST 20  ALT 17  ALKPHOS 63  BILITOT 0.7  PROT 6.3*  ALBUMIN 2.9*   Recent Labs  Lab 05/24/20 1741 05/24/20 1756  WBC 11.7*  --   NEUTROABS 9.5*  --   HGB 10.3* 11.2*  HCT 35.1* 33.0*  MCV 88.4  --   PLT 265  --    No results for input(s): CKTOTAL, CKMB, CKMBINDEX, TROPONINI in the last 168 hours. Recent Labs    05/24/20 1741  LABPROT 14.6  INR 1.2   Recent Labs    05/24/20 1842  COLORURINE COLORLESS*  LABSPEC 1.012  PHURINE 5.0  GLUCOSEU NEGATIVE  HGBUR NEGATIVE  BILIRUBINUR NEGATIVE  KETONESUR NEGATIVE  PROTEINUR NEGATIVE  NITRITE NEGATIVE  LEUKOCYTESUR NEGATIVE       Component Value Date/Time   CHOL 133 05/25/2020 0654   TRIG 73 05/25/2020 0654   HDL 39 (L) 05/25/2020 0654   CHOLHDL 3.4 05/25/2020 0654   VLDL 15 05/25/2020 0654   LDLCALC 79 05/25/2020 0654   LDLCALC 110 (H) 07/14/2019 0820   Lab Results  Component Value Date   HGBA1C  5.4 05/25/2020      Component Value Date/Time   LABOPIA NONE DETECTED 05/24/2020 1842   COCAINSCRNUR NONE DETECTED 05/24/2020 1842   LABBENZ NONE DETECTED 05/24/2020 1842   AMPHETMU NONE DETECTED 05/24/2020 1842   THCU NONE DETECTED 05/24/2020 1842   LABBARB NONE DETECTED 05/24/2020 1842    Recent Labs  Lab 05/24/20 1741  ETH <10    I have personally reviewed the radiological images below and agree with the radiology interpretations.  CT ANGIO HEAD W OR WO CONTRAST  Result Date: 05/24/2020 CLINICAL DATA:  Left facial droop and slurred speech. Negative acute head CT. EXAM: CT ANGIOGRAPHY HEAD AND NECK TECHNIQUE: Multidetector CT imaging of the head and neck was performed using the standard protocol during bolus administration of intravenous contrast. Multiplanar CT image reconstructions and MIPs were obtained to evaluate the vascular anatomy. Carotid stenosis measurements (when applicable) are obtained utilizing NASCET criteria, using the distal internal carotid diameter as the denominator. CONTRAST:  13mL OMNIPAQUE IOHEXOL 350 MG/ML SOLN COMPARISON:  Head CT same day FINDINGS: CTA NECK FINDINGS Aortic arch: Aortic atherosclerotic calcification. Normal branching pattern without origin stenosis. Right carotid system: Common carotid artery widely patent to the bifurcation region. Calcified plaque at the  carotid bifurcation and ICA bulb but no stenosis. Cervical ICA is tortuous but widely patent. Left carotid system: Common carotid artery widely patent to the bifurcation region. Calcified plaque at the carotid bifurcation and ICA bulb. Minimal diameter of the distal ICA bulb measures 3.2 mm. Compared to a more distal cervical ICA diameter of 5 mm, this indicates a 35% stenosis. Vertebral arteries: Both vertebral artery origins are patent. Some calcified plaque in the proximal right vertebral artery but without stenosis greater than 30%. Beyond that, both vertebral arteries widely patent through the  cervical region to the foramen magnum. Skeleton: Chronic cervical spondylosis.  No acute finding. Other neck: No soft tissue mass or lymphadenopathy. Upper chest: Negative Review of the MIP images confirms the above findings CTA HEAD FINDINGS Anterior circulation: Both internal carotid arteries widely patent through the skull base and siphon regions. Ordinary siphon atherosclerotic calcification without stenosis. The anterior and middle cerebral vessels are patent. No large or medium vessel occlusion. No correctable proximal stenosis. Mild atherosclerotic irregularity of the more distal branch vessels. Posterior circulation: Both vertebral arteries widely patent to the basilar. No basilar stenosis. Posterior circulation branch vessels are patent. There is atherosclerotic narrowing and irregularity of the more distal PCA branches. Venous sinuses: Patent and normal. Anatomic variants: None significant. Review of the MIP images confirms the above findings IMPRESSION: 1. No large or medium vessel occlusion. 2. Aortic atherosclerosis. 3. Atherosclerotic disease at both carotid bifurcations. 35% stenosis of the distal ICA bulb on the left. No measurable stenosis on the right. 4. Intracranial atherosclerotic narrowing and irregularity of the more distal branch vessels. 5. These results were communicated to Dr. Erlinda Hong At 5:40 pmon 10/4/2021by text page via the [facility] messaging system. Aortic Atherosclerosis (ICD10-I70.0). Electronically Signed   By: Nelson Chimes M.D.   On: 05/24/2020 17:40   CT ANGIO NECK W OR WO CONTRAST  Result Date: 05/24/2020 CLINICAL DATA:  Left facial droop and slurred speech. Negative acute head CT. EXAM: CT ANGIOGRAPHY HEAD AND NECK TECHNIQUE: Multidetector CT imaging of the head and neck was performed using the standard protocol during bolus administration of intravenous contrast. Multiplanar CT image reconstructions and MIPs were obtained to evaluate the vascular anatomy. Carotid stenosis  measurements (when applicable) are obtained utilizing NASCET criteria, using the distal internal carotid diameter as the denominator. CONTRAST:  61mL OMNIPAQUE IOHEXOL 350 MG/ML SOLN COMPARISON:  Head CT same day FINDINGS: CTA NECK FINDINGS Aortic arch: Aortic atherosclerotic calcification. Normal branching pattern without origin stenosis. Right carotid system: Common carotid artery widely patent to the bifurcation region. Calcified plaque at the carotid bifurcation and ICA bulb but no stenosis. Cervical ICA is tortuous but widely patent. Left carotid system: Common carotid artery widely patent to the bifurcation region. Calcified plaque at the carotid bifurcation and ICA bulb. Minimal diameter of the distal ICA bulb measures 3.2 mm. Compared to a more distal cervical ICA diameter of 5 mm, this indicates a 35% stenosis. Vertebral arteries: Both vertebral artery origins are patent. Some calcified plaque in the proximal right vertebral artery but without stenosis greater than 30%. Beyond that, both vertebral arteries widely patent through the cervical region to the foramen magnum. Skeleton: Chronic cervical spondylosis.  No acute finding. Other neck: No soft tissue mass or lymphadenopathy. Upper chest: Negative Review of the MIP images confirms the above findings CTA HEAD FINDINGS Anterior circulation: Both internal carotid arteries widely patent through the skull base and siphon regions. Ordinary siphon atherosclerotic calcification without stenosis. The anterior and middle cerebral vessels  are patent. No large or medium vessel occlusion. No correctable proximal stenosis. Mild atherosclerotic irregularity of the more distal branch vessels. Posterior circulation: Both vertebral arteries widely patent to the basilar. No basilar stenosis. Posterior circulation branch vessels are patent. There is atherosclerotic narrowing and irregularity of the more distal PCA branches. Venous sinuses: Patent and normal. Anatomic  variants: None significant. Review of the MIP images confirms the above findings IMPRESSION: 1. No large or medium vessel occlusion. 2. Aortic atherosclerosis. 3. Atherosclerotic disease at both carotid bifurcations. 35% stenosis of the distal ICA bulb on the left. No measurable stenosis on the right. 4. Intracranial atherosclerotic narrowing and irregularity of the more distal branch vessels. 5. These results were communicated to Dr. Erlinda Hong At 5:40 pmon 10/4/2021by text page via the [facility] messaging system. Aortic Atherosclerosis (ICD10-I70.0). Electronically Signed   By: Nelson Chimes M.D.   On: 05/24/2020 17:40   MR BRAIN WO CONTRAST  Result Date: 05/25/2020 CLINICAL DATA:  Follow-up examination for acute stroke. EXAM: MRI HEAD WITHOUT CONTRAST TECHNIQUE: Multiplanar, multiecho pulse sequences of the brain and surrounding structures were obtained without intravenous contrast. COMPARISON:  Prior CTs from 05/24/2020. FINDINGS: Brain: Diffuse prominence of the CSF containing spaces compatible with generalized cerebral atrophy. Patchy and confluent T2/FLAIR hyperintensity within the periventricular deep white matter both cerebral hemispheres most compatible chronic microvascular ischemic disease, moderate in nature. Small remote lacunar infarct noted at the left thalamus. Additional small remote left cerebellar infarct noted as well. Patchy restricted diffusion seen involving the cortical and subcortical aspect of the posterior left frontoparietal region, consistent with acute left MCA territory infarct. No associated hemorrhage or mass effect. No other evidence for acute or subacute ischemia. Gray-white matter differentiation otherwise maintained. Few additional punctate chronic micro hemorrhages noted at the left temporal occipital region and left cerebellum. No mass lesion, midline shift or mass effect. No hydrocephalus or extra-axial fluid collection. Pituitary gland and suprasellar region within normal limits.  Midline structures intact. Multiple prominent arachnoid pits noted at the occipital calvarium. Vascular: Major intracranial vascular flow voids are maintained. Skull and upper cervical spine: Craniocervical junction within normal limits. Bone marrow signal intensity normal. No scalp soft tissue abnormality. Sinuses/Orbits: Patient status post bilateral ocular lens replacement. Paranasal sinuses are clear. No mastoid effusion. Other: None. IMPRESSION: 1. Patchy small volume acute ischemic left MCA territory infarct involving the posterior left frontoparietal region. No associated hemorrhage or mass effect. 2. Underlying age-related cerebral atrophy with moderate chronic small vessel ischemic disease, with small remote infarcts involving the left thalamus and left cerebellum. Electronically Signed   By: Jeannine Boga M.D.   On: 05/25/2020 01:34   DG Chest Portable 1 View  Result Date: 05/24/2020 CLINICAL DATA:  Weakness. EXAM: PORTABLE CHEST 1 VIEW COMPARISON:  Jan 19, 2020 FINDINGS: There is cardiomegaly. There is a moderate-sized left-sided pleural effusion. There is a small right-sided pleural effusion. There is no pneumothorax. There is atelectasis at the lung bases. There are atherosclerotic changes of the thoracic aorta. There is no acute osseous abnormality. IMPRESSION: 1. Cardiomegaly with bilateral pleural effusions, moderate on the left and small on the right. 2. Bibasilar atelectasis. Electronically Signed   By: Constance Holster M.D.   On: 05/24/2020 19:09   ECHOCARDIOGRAM COMPLETE  Result Date: 05/25/2020    ECHOCARDIOGRAM REPORT   Patient Name:   [name] Date of Exam: 05/25/2020 Medical Rec #:  [id number]     Height:       62.0 in Accession #:  [id number]    Weight:       106.9 lb Date of Birth:  [birth year]     BSA:          1.465 m Patient Age:    89 years      BP:           118/73 mmHg Patient Gender: F             HR:           113 bpm. Exam Location:  Inpatient Procedure: 2D  Echo, Cardiac Doppler and Color Doppler Indications:    TIA 435.9 / G45.9  History:        Patient has prior history of Echocardiogram examinations, most                 recent 01/22/2020. Signs/Symptoms:Shortness of Breath; Risk                 Factors:Hypertension and Dyslipidemia.  Sonographer:    Bernadene Person RDCS Referring Phys: [place]  1. Left ventricular ejection fraction, by estimation, is 60 to 65%. The left ventricle has normal function. The left ventricle has no regional wall motion abnormalities. Left ventricular diastolic parameters are consistent with Grade II diastolic dysfunction (pseudonormalization). Elevated left ventricular end-diastolic pressure.  2. Right ventricular systolic function is normal. The right ventricular size is normal. There is mildly elevated pulmonary artery systolic pressure.  3. Left atrial size was severely dilated.  4. Right atrial size was mildly dilated.  5. Moderate pleural effusion in the left lateral region.  6. The mitral valve is normal in structure. Mild mitral valve regurgitation. No evidence of mitral stenosis.  7. The aortic valve is normal in structure. Aortic valve regurgitation is trivial. No aortic stenosis is present.  8. The inferior vena cava is normal in size with greater than 50% respiratory variability, suggesting right atrial pressure of 3 mmHg. FINDINGS  Left Ventricle: Left ventricular ejection fraction, by estimation, is 60 to 65%. The left ventricle has normal function. The left ventricle has no regional wall motion abnormalities. The left ventricular internal cavity size was normal in size. There is  no left ventricular hypertrophy. Left ventricular diastolic parameters are consistent with Grade II diastolic dysfunction (pseudonormalization). Elevated left ventricular end-diastolic pressure. Right Ventricle: The right ventricular size is normal. No increase in right ventricular wall thickness. Right ventricular  systolic function is normal. There is mildly elevated pulmonary artery systolic pressure. The tricuspid regurgitant velocity is 3.19  m/s, and with an assumed right atrial pressure of 3 mmHg, the estimated right ventricular systolic pressure is 00.1 mmHg. Left Atrium: Left atrial size was severely dilated. Right Atrium: Right atrial size was mildly dilated. Pericardium: There is no evidence of pericardial effusion. Mitral Valve: The mitral valve is normal in structure. Mild mitral annular calcification. Mild mitral valve regurgitation. No evidence of mitral valve stenosis. Tricuspid Valve: The tricuspid valve is normal in structure. Tricuspid valve regurgitation is mild . No evidence of tricuspid stenosis. Aortic Valve: The aortic valve is normal in structure. Aortic valve regurgitation is trivial. Aortic regurgitation PHT measures 288 msec. No aortic stenosis is present. Pulmonic Valve: The pulmonic valve was normal in structure. Pulmonic valve regurgitation is not visualized. No evidence of pulmonic stenosis. Aorta: The aortic root is normal in size and structure. Venous: The inferior vena cava is normal in size with greater than 50% respiratory variability, suggesting right atrial pressure of 3 mmHg. IAS/Shunts: No atrial  level shunt detected by color flow Doppler. Additional Comments: There is a moderate pleural effusion in the left lateral region.  LEFT VENTRICLE PLAX 2D LVIDd:         3.30 cm     Diastology LVIDs:         2.62 cm     LV e' medial:    6.05 cm/s LV PW:         0.97 cm     LV E/e' medial:  19.7 LV IVS:        0.90 cm     LV e' lateral:   7.36 cm/s LVOT diam:     1.80 cm     LV E/e' lateral: 16.2 LV SV:         40 LV SV Index:   27 LVOT Area:     2.54 cm  LV Volumes (MOD) LV vol d, MOD A2C: 28.7 ml LV vol d, MOD A4C: 32.7 ml LV vol s, MOD A2C: 12.7 ml LV vol s, MOD A4C: 15.8 ml LV SV MOD A2C:     16.0 ml LV SV MOD A4C:     32.7 ml LV SV MOD BP:      16.5 ml RIGHT VENTRICLE RV S prime:     6.84  cm/s TAPSE (M-mode): 1.4 cm LEFT ATRIUM             Index       RIGHT ATRIUM           Index LA diam:        2.50 cm 1.71 cm/m  RA Area:     19.90 cm LA Vol (A2C):   69.5 ml 47.44 ml/m RA Volume:   46.60 ml  31.81 ml/m LA Vol (A4C):   47.8 ml 32.63 ml/m LA Biplane Vol: 59.6 ml 40.68 ml/m  AORTIC VALVE LVOT Vmax:   90.10 cm/s LVOT Vmean:  56.300 cm/s LVOT VTI:    0.157 m AI PHT:      288 msec  AORTA Ao Root diam: 3.40 cm Ao Asc diam:  3.40 cm MITRAL VALVE                 TRICUSPID VALVE MV Area (PHT): 9.60 cm      TR Peak grad:   40.7 mmHg MV Decel Time: 79 msec       TR Vmax:        319.00 cm/s MR Peak grad:    124.1 mmHg MR Mean grad:    79.0 mmHg   SHUNTS MR Vmax:         557.00 cm/s Systemic VTI:  0.16 m MR Vmean:        423.0 cm/s  Systemic Diam: 1.80 cm MR PISA:         0.39 cm MR PISA Eff ROA: 3 mm MR PISA Radius:  0.25 cm MV E velocity: 119.00 cm/s MV A velocity: 79.90 cm/s MV E/A ratio:  1.49 Skeet Latch MD Electronically signed by Skeet Latch MD Signature Date/Time: 05/25/2020/12:14:19 PM    Final    CT HEAD CODE STROKE WO CONTRAST  Result Date: 05/24/2020 CLINICAL DATA:  Code stroke.  Acute neurological deficit. EXAM: CT HEAD WITHOUT CONTRAST TECHNIQUE: Contiguous axial images were obtained from the base of the skull through the vertex without intravenous contrast. COMPARISON:  None. FINDINGS: Brain: Age related atrophy. Chronic small-vessel ischemic changes throughout the cerebral hemispheric white matter. No sign of acute infarction, mass lesion, hemorrhage, hydrocephalus or  extra-axial collection. Vascular: There is atherosclerotic calcification of the major vessels at the base of the brain. Skull: Negative Sinuses/Orbits: Clear/normal Other: None ASPECTS ([place] Stroke Program Early CT Score) - Ganglionic level infarction (caudate, lentiform nuclei, internal capsule, insula, M1-M3 cortex): 7 - Supraganglionic infarction (M4-M6 cortex): 3 Total score (0-10 with 10 being normal):  10 IMPRESSION: 1. No acute finding by CT. Atrophy and chronic small-vessel ischemic changes. 2. ASPECTS is 10. 3. These results were communicated to Dr. Erlinda Hong At 5:23 pmon 10/4/2021by text page via the [facility] messaging system. Electronically Signed   By: Nelson Chimes M.D.   On: 05/24/2020 17:24    PHYSICAL EXAM  Temp:  [98.5 F (36.9 C)-98.6 F (37 C)] 98.5 F (36.9 C) (10/05 1215) Pulse Rate:  [46-137] 89 (10/05 1600) Resp:  [14-38] 16 (10/05 1600) BP: (111-151)/(58-109) 127/79 (10/05 1600) SpO2:  [85 %-100 %] 93 % (10/05 1445)  General - well nourished, well developed, in no apparent distress.    Ophthalmologic - fundi not visualized due to noncooperation.    Cardiovascular - irregularly irregular heart rate and rhythm.  Neuro - awake alert orientated to age, month and place, but not situation. No aphasia, able to name and repeat. Following all simple commands but hard of hearing. Visual field full, no gaze deviation. Facial symmtrical. Tongue midline. PERRL. BUE 4/5 no drift. BLE 3/5 with mild drift bilaterally. Sensation subjectively symmetrical. FTN grossly intact. Gait not tested.   ASSESSMENT/PLAN Ms. MAKHYA ARAVE is a 86 y.o. female with history of Afib RVR off eliquis, HTN, asthma, living in ALF admitted for right facial droop and speech difficulty. No tPA given due to nondisabling symptoms.    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  CT head and neck no LVO, intracranial atherosclerosis  MRI left MCA frontoparietal infarct  2D Echo EF 60 to 65%.  LA severely dilated  LDL 79  HgbA1c 5.4  SCDs for VTE prophylaxis  No antithrombotic prior to admission, now on aspirin 325 mg daily.  Patient agreeable with resume Eliquis.  Recommend to resume Eliquis tomorrow evening.  Patient counseled to be compliant with her antithrombotic medications  Ongoing aggressive stroke risk factor management  Therapy  recommendations: Home health PT OT  Disposition: Pending  Chronic A. Fib  Follow with Dr. Nechama Guard in cardiology  Was on Eliquis 2.5 twice daily  According to pharmacy patient stop by herself due to fear of falls.  However patient denies stopping any medications by herself  Patient agreed to resume Eliquis.  Recommend to restart Eliquis tomorrow evening.  Continue home Cardizem and metoprolol  Hypertension . Stable . On Cardizem and metoprolol . Permissive hypertension (OK if <220/120) for 24-48 hours post stroke and then gradually normalized within 2-3 days.  Long term BP goal normotensive  Hyperlipidemia  Home meds: None  LDL 79, goal < 70  Now on Lipitor 20 given advanced age and relatively low LDL level  Continue statin at discharge  Other Stroke Risk Factors  Advanced age  Other [facility] day # 0  Neurology will sign off. Please call with questions. Pt will follow up with stroke clinic NP at [facility] in about 4 weeks. Thanks for the consult.   Rosalin Hawking, MD PhD Stroke Neurology 05/25/2020 5:28 PM    To contact Stroke Continuity provider, please refer to [email or web address] After hours, contact General Neurology

## 2020-05-25 NOTE — Progress Notes (Signed)
PROGRESS NOTE    Terri Acosta  PRF:[id number] DOB: April 18, 1923 DOA: 05/24/2020 PCP: [name], [name], NP   Chief Complain: Slurred speech, right facial droop  Brief Narrative:  Patient is a 86 year old female with history of paroxysmal A. fib currently off Eliquis due to concern for falls, hypertension, anemia who was brought to the emergency department from skilled nursing facilty after she was found to have slurred speech, right facial droop.  No report of weakness in the extremities.  MRI of the brain showed left nonhemorrhagic MCA stroke.  CT angiogram of the head and neck did not show any large vessel occlusion.  Neurology consulted and following.  PT/OT consulted.  Assessment & Plan:   Principal Problem:   Acute CVA (cerebrovascular accident) ([place]) Active Problems:   Essential hypertension   COPD (chronic obstructive pulmonary disease) ([facility])   Atrial fibrillation with rapid ventricular response ([place])   Acute nonhemorrhagic CVA: Presented with slurred speech, right facial droop.  Lower extremity weakness.  She was previously on Eliquis which had been stopped by her cardiologist on 02/25/2020 for concern of falls.  Currently on aspirin.  Neurology following.  Hemoglobin A1c of 5.4.  LDL of 79.  Continue statin.  PT/OT consulted and recommended HH.  Echo showed left ventricular ejection fraction of 93-57%, grade 2 diastolic dysfunction, no obvious intracardiac source of emboli. We will follow-up with neurology for recommendation if she needs to be restarted on Eliquis. On her baseline, she ambulates with help of walker and is an Environmental consultant  living facility resident  A. fib with RVR: Was in RVR on presentation.  Her home medications have been restarted expecting improvement.  Was previously on Eliquis which has been stopped since July 2021.  TSH normal.  Monitor on telemetry.  Normocytic anemia: Currently hemoglobin stable.  Continue to monitor  Hypertension: Currently blood pressure stable.   Allow permissive hypertension.  Gradually normalize blood pressure in 3 to 5 days.  Continue as needed medications for severe hypertension.  COPD: Currently not in exacerbation.         DVT prophylaxis: Lovenox Code Status: DNR Family Communication: Called and discussed with daughter on phone today.  She requests update tomorrow Status is: Inpatient   Dispo: The patient is from: SNF              Anticipated d/c is to: SNF              Anticipated d/c date is:1-2 days              Patient currently is not medically stable to d/c.  HR still in the range of 110s. Neurology shd clear her before DC    Consultants: Neurology  Procedures:None  Antimicrobials:  Anti-infectives (From admission, onward)   None      Subjective:  Patient seen and examined at the bedside this morning.  She was in A. fib with heart rate in the range of 110s.  She still had right facial droop but her speech has improved.  She has global weakness on the extremities.  Denies any other complaints.  Objective: Vitals:   05/25/20 0300 05/25/20 0315 05/25/20 0330 05/25/20 0345  BP: 140/76 111/73 129/78 118/73  Pulse: 96 60 87 79  Resp: (!) 22 (!) 28 (!) 21 (!) 30  Temp:      TempSrc:      SpO2: 96% 92% 97% 94%  Weight:      Height:       No intake  or output data in the 24 hours ending 05/25/20 0831 Filed Weights   05/24/20 1715  Weight: 48.5 kg    Examination:  General exam: Very deconditioned, debilitated, pleasant elderly female HEENT:PERRL,Oral mucosa moist, Ear/Nose normal on gross exam Respiratory system: Bilateral equal air entry, normal vesicular breath sounds, no wheezes or crackles  Cardiovascular system: Afib with RVR,No JVD, murmurs, rubs, gallops or clicks. No pedal edema. Gastrointestinal system: Abdomen is nondistended, soft and nontender. No organomegaly or masses felt. Normal bowel sounds heard. Central nervous system: Alert and oriented.  Right facial droop, generalized  weakness on the extremities with no focal deficits,dysarthia Extremities: No edema, no clubbing ,no cyanosis Skin: No rashes, lesions or ulcers,no icterus ,no pallor   Data Reviewed: I have personally reviewed following labs and imaging studies  CBC: Recent Labs  Lab 05/24/20 1741 05/24/20 1756  WBC 11.7*  --   NEUTROABS 9.5*  --   HGB 10.3* 11.2*  HCT 35.1* 33.0*  MCV 88.4  --   PLT 265  --    Basic Metabolic Panel: Recent Labs  Lab 05/24/20 1741 05/24/20 1756  NA 143 142  K 3.6 3.5  CL 102 102  CO2 29  --   GLUCOSE 117* 113*  BUN 26* 29*  CREATININE 0.68 0.60  CALCIUM 8.8*  --    GFR: Estimated Creatinine Clearance: 32.2 mL/min (by C-G formula based on SCr of 0.6 mg/dL). Liver Function Tests: Recent Labs  Lab 05/24/20 1741  AST 20  ALT 17  ALKPHOS 63  BILITOT 0.7  PROT 6.3*  ALBUMIN 2.9*   No results for input(s): LIPASE, AMYLASE in the last 168 hours. No results for input(s): AMMONIA in the last 168 hours. Coagulation Profile: Recent Labs  Lab 05/24/20 1741  INR 1.2   Cardiac Enzymes: No results for input(s): CKTOTAL, CKMB, CKMBINDEX, TROPONINI in the last 168 hours. BNP (last 3 results) No results for input(s): PROBNP in the last 8760 hours. HbA1C: Recent Labs    05/25/20 0654  HGBA1C 5.4   CBG: Recent Labs  Lab 05/24/20 1712  GLUCAP 116*   Lipid Profile: Recent Labs    05/25/20 0654  CHOL 133  HDL 39*  LDLCALC 79  TRIG 73  CHOLHDL 3.4   Thyroid Function Tests: Recent Labs    05/25/20 0654  TSH 2.095   Anemia Panel: No results for input(s): VITAMINB12, FOLATE, FERRITIN, TIBC, IRON, RETICCTPCT in the last 72 hours. Sepsis Labs: No results for input(s): PROCALCITON, LATICACIDVEN in the last 168 hours.  Recent Results (from the past 240 hour(s))  Respiratory Panel by RT PCR (Flu A&B, Covid) - Urine, Clean Catch     Status: None   Collection Time: 05/24/20  6:42 PM   Specimen: Urine, Clean Catch; Nasopharyngeal  Result  Value Ref Range Status   SARS Coronavirus 2 by RT PCR NEGATIVE NEGATIVE Final    Comment: (NOTE) SARS-CoV-2 target nucleic acids are NOT DETECTED.  The SARS-CoV-2 RNA is generally detectable in upper respiratoy specimens during the acute phase of infection. The lowest concentration of SARS-CoV-2 viral copies this assay can detect is 131 copies/mL. A negative result does not preclude SARS-Cov-2 infection and should not be used as the sole basis for treatment or other patient management decisions. A negative result may occur with  improper specimen collection/handling, submission of specimen other than nasopharyngeal swab, presence of viral mutation(s) within the areas targeted by this assay, and inadequate number of viral copies (<131 copies/mL). A negative result must be  combined with clinical observations, patient history, and epidemiological information. The expected result is Negative.  Fact Sheet for Patients:  PinkCheek.be  Fact Sheet for Healthcare Providers:  GravelBags.it  This test is no t yet approved or cleared by the [place] FDA and  has been authorized for detection and/or diagnosis of SARS-CoV-2 by FDA under an Emergency Use Authorization (EUA). This EUA will remain  in effect (meaning this test can be used) for the duration of the COVID-19 declaration under Section 564(b)(1) of the Act, 21 U.S.C. section 360bbb-3(b)(1), unless the authorization is terminated or revoked sooner.     Influenza A by PCR NEGATIVE NEGATIVE Final   Influenza B by PCR NEGATIVE NEGATIVE Final    Comment: (NOTE) The Xpert Xpress SARS-CoV-2/FLU/RSV assay is intended as an aid in  the diagnosis of influenza from Nasopharyngeal swab specimens and  should not be used as a sole basis for treatment. Nasal washings and  aspirates are unacceptable for Xpert Xpress SARS-CoV-2/FLU/RSV  testing.  Fact Sheet for  Patients: PinkCheek.be  Fact Sheet for Healthcare Providers: GravelBags.it  This test is not yet approved or cleared by the [place] FDA and  has been authorized for detection and/or diagnosis of SARS-CoV-2 by  FDA under an Emergency Use Authorization (EUA). This EUA will remain  in effect (meaning this test can be used) for the duration of the  Covid-19 declaration under Section 564(b)(1) of the Act, 21  U.S.C. section 360bbb-3(b)(1), unless the authorization is  terminated or revoked. Performed at [facility], [place] [address] [place], [place] [address]          Radiology Studies: CT ANGIO HEAD W OR WO CONTRAST  Result Date: 05/24/2020 CLINICAL DATA:  Left facial droop and slurred speech. Negative acute head CT. EXAM: CT ANGIOGRAPHY HEAD AND NECK TECHNIQUE: Multidetector CT imaging of the head and neck was performed using the standard protocol during bolus administration of intravenous contrast. Multiplanar CT image reconstructions and MIPs were obtained to evaluate the vascular anatomy. Carotid stenosis measurements (when applicable) are obtained utilizing NASCET criteria, using the distal internal carotid diameter as the denominator. CONTRAST:  106mL OMNIPAQUE IOHEXOL 350 MG/ML SOLN COMPARISON:  Head CT same day FINDINGS: CTA NECK FINDINGS Aortic arch: Aortic atherosclerotic calcification. Normal branching pattern without origin stenosis. Right carotid system: Common carotid artery widely patent to the bifurcation region. Calcified plaque at the carotid bifurcation and ICA bulb but no stenosis. Cervical ICA is tortuous but widely patent. Left carotid system: Common carotid artery widely patent to the bifurcation region. Calcified plaque at the carotid bifurcation and ICA bulb. Minimal diameter of the distal ICA bulb measures 3.2 mm. Compared to a more distal cervical ICA diameter of 5 mm, this indicates a 35% stenosis.  Vertebral arteries: Both vertebral artery origins are patent. Some calcified plaque in the proximal right vertebral artery but without stenosis greater than 30%. Beyond that, both vertebral arteries widely patent through the cervical region to the foramen magnum. Skeleton: Chronic cervical spondylosis.  No acute finding. Other neck: No soft tissue mass or lymphadenopathy. Upper chest: Negative Review of the MIP images confirms the above findings CTA HEAD FINDINGS Anterior circulation: Both internal carotid arteries widely patent through the skull base and siphon regions. Ordinary siphon atherosclerotic calcification without stenosis. The anterior and middle cerebral vessels are patent. No large or medium vessel occlusion. No correctable proximal stenosis. Mild atherosclerotic irregularity of the more distal branch vessels. Posterior circulation: Both vertebral arteries widely patent to the  basilar. No basilar stenosis. Posterior circulation branch vessels are patent. There is atherosclerotic narrowing and irregularity of the more distal PCA branches. Venous sinuses: Patent and normal. Anatomic variants: None significant. Review of the MIP images confirms the above findings IMPRESSION: 1. No large or medium vessel occlusion. 2. Aortic atherosclerosis. 3. Atherosclerotic disease at both carotid bifurcations. 35% stenosis of the distal ICA bulb on the left. No measurable stenosis on the right. 4. Intracranial atherosclerotic narrowing and irregularity of the more distal branch vessels. 5. These results were communicated to Dr. Erlinda Hong At 5:40 pmon 10/4/2021by text page via the [facility] messaging system. Aortic Atherosclerosis (ICD10-I70.0). Electronically Signed   By: Nelson Chimes M.D.   On: 05/24/2020 17:40   CT ANGIO NECK W OR WO CONTRAST  Result Date: 05/24/2020 CLINICAL DATA:  Left facial droop and slurred speech. Negative acute head CT. EXAM: CT ANGIOGRAPHY HEAD AND NECK TECHNIQUE: Multidetector CT imaging of the head  and neck was performed using the standard protocol during bolus administration of intravenous contrast. Multiplanar CT image reconstructions and MIPs were obtained to evaluate the vascular anatomy. Carotid stenosis measurements (when applicable) are obtained utilizing NASCET criteria, using the distal internal carotid diameter as the denominator. CONTRAST:  66mL OMNIPAQUE IOHEXOL 350 MG/ML SOLN COMPARISON:  Head CT same day FINDINGS: CTA NECK FINDINGS Aortic arch: Aortic atherosclerotic calcification. Normal branching pattern without origin stenosis. Right carotid system: Common carotid artery widely patent to the bifurcation region. Calcified plaque at the carotid bifurcation and ICA bulb but no stenosis. Cervical ICA is tortuous but widely patent. Left carotid system: Common carotid artery widely patent to the bifurcation region. Calcified plaque at the carotid bifurcation and ICA bulb. Minimal diameter of the distal ICA bulb measures 3.2 mm. Compared to a more distal cervical ICA diameter of 5 mm, this indicates a 35% stenosis. Vertebral arteries: Both vertebral artery origins are patent. Some calcified plaque in the proximal right vertebral artery but without stenosis greater than 30%. Beyond that, both vertebral arteries widely patent through the cervical region to the foramen magnum. Skeleton: Chronic cervical spondylosis.  No acute finding. Other neck: No soft tissue mass or lymphadenopathy. Upper chest: Negative Review of the MIP images confirms the above findings CTA HEAD FINDINGS Anterior circulation: Both internal carotid arteries widely patent through the skull base and siphon regions. Ordinary siphon atherosclerotic calcification without stenosis. The anterior and middle cerebral vessels are patent. No large or medium vessel occlusion. No correctable proximal stenosis. Mild atherosclerotic irregularity of the more distal branch vessels. Posterior circulation: Both vertebral arteries widely patent to the  basilar. No basilar stenosis. Posterior circulation branch vessels are patent. There is atherosclerotic narrowing and irregularity of the more distal PCA branches. Venous sinuses: Patent and normal. Anatomic variants: None significant. Review of the MIP images confirms the above findings IMPRESSION: 1. No large or medium vessel occlusion. 2. Aortic atherosclerosis. 3. Atherosclerotic disease at both carotid bifurcations. 35% stenosis of the distal ICA bulb on the left. No measurable stenosis on the right. 4. Intracranial atherosclerotic narrowing and irregularity of the more distal branch vessels. 5. These results were communicated to Dr. Erlinda Hong At 5:40 pmon 10/4/2021by text page via the [facility] messaging system. Aortic Atherosclerosis (ICD10-I70.0). Electronically Signed   By: Nelson Chimes M.D.   On: 05/24/2020 17:40   MR BRAIN WO CONTRAST  Result Date: 05/25/2020 CLINICAL DATA:  Follow-up examination for acute stroke. EXAM: MRI HEAD WITHOUT CONTRAST TECHNIQUE: Multiplanar, multiecho pulse sequences of the brain and surrounding structures were obtained  without intravenous contrast. COMPARISON:  Prior CTs from 05/24/2020. FINDINGS: Brain: Diffuse prominence of the CSF containing spaces compatible with generalized cerebral atrophy. Patchy and confluent T2/FLAIR hyperintensity within the periventricular deep white matter both cerebral hemispheres most compatible chronic microvascular ischemic disease, moderate in nature. Small remote lacunar infarct noted at the left thalamus. Additional small remote left cerebellar infarct noted as well. Patchy restricted diffusion seen involving the cortical and subcortical aspect of the posterior left frontoparietal region, consistent with acute left MCA territory infarct. No associated hemorrhage or mass effect. No other evidence for acute or subacute ischemia. Gray-white matter differentiation otherwise maintained. Few additional punctate chronic micro hemorrhages noted at the left  temporal occipital region and left cerebellum. No mass lesion, midline shift or mass effect. No hydrocephalus or extra-axial fluid collection. Pituitary gland and suprasellar region within normal limits. Midline structures intact. Multiple prominent arachnoid pits noted at the occipital calvarium. Vascular: Major intracranial vascular flow voids are maintained. Skull and upper cervical spine: Craniocervical junction within normal limits. Bone marrow signal intensity normal. No scalp soft tissue abnormality. Sinuses/Orbits: Patient status post bilateral ocular lens replacement. Paranasal sinuses are clear. No mastoid effusion. Other: None. IMPRESSION: 1. Patchy small volume acute ischemic left MCA territory infarct involving the posterior left frontoparietal region. No associated hemorrhage or mass effect. 2. Underlying age-related cerebral atrophy with moderate chronic small vessel ischemic disease, with small remote infarcts involving the left thalamus and left cerebellum. Electronically Signed   By: Jeannine Boga M.D.   On: 05/25/2020 01:34   DG Chest Portable 1 View  Result Date: 05/24/2020 CLINICAL DATA:  Weakness. EXAM: PORTABLE CHEST 1 VIEW COMPARISON:  Jan 19, 2020 FINDINGS: There is cardiomegaly. There is a moderate-sized left-sided pleural effusion. There is a small right-sided pleural effusion. There is no pneumothorax. There is atelectasis at the lung bases. There are atherosclerotic changes of the thoracic aorta. There is no acute osseous abnormality. IMPRESSION: 1. Cardiomegaly with bilateral pleural effusions, moderate on the left and small on the right. 2. Bibasilar atelectasis. Electronically Signed   By: Constance Holster M.D.   On: 05/24/2020 19:09   CT HEAD CODE STROKE WO CONTRAST  Result Date: 05/24/2020 CLINICAL DATA:  Code stroke.  Acute neurological deficit. EXAM: CT HEAD WITHOUT CONTRAST TECHNIQUE: Contiguous axial images were obtained from the base of the skull through the  vertex without intravenous contrast. COMPARISON:  None. FINDINGS: Brain: Age related atrophy. Chronic small-vessel ischemic changes throughout the cerebral hemispheric white matter. No sign of acute infarction, mass lesion, hemorrhage, hydrocephalus or extra-axial collection. Vascular: There is atherosclerotic calcification of the major vessels at the base of the brain. Skull: Negative Sinuses/Orbits: Clear/normal Other: None ASPECTS ([place] Stroke Program Early CT Score) - Ganglionic level infarction (caudate, lentiform nuclei, internal capsule, insula, M1-M3 cortex): 7 - Supraganglionic infarction (M4-M6 cortex): 3 Total score (0-10 with 10 being normal): 10 IMPRESSION: 1. No acute finding by CT. Atrophy and chronic small-vessel ischemic changes. 2. ASPECTS is 10. 3. These results were communicated to Dr. Erlinda Hong At 5:23 pmon 10/4/2021by text page via the [facility] messaging system. Electronically Signed   By: Nelson Chimes M.D.   On: 05/24/2020 17:24        Scheduled Meds: .  stroke: mapping our early stages of recovery book   Does not apply Once  . aspirin  300 mg Rectal Daily   Or  . aspirin  325 mg Oral Daily  . diltiazem  120 mg Oral Daily  . enoxaparin (LOVENOX) injection  30 mg Subcutaneous Q24H  . feeding supplement (PRO-STAT SUGAR FREE 64)  30 mL Oral BID WC  . magnesium oxide  400 mg Oral Daily  . metoprolol tartrate  75 mg Oral BID  . mirtazapine  7.5 mg Oral QODAY  . saccharomyces boulardii  250 mg Oral BID  . vitamin B-12  1,000 mcg Oral Daily   Continuous Infusions:   LOS: 0 days    Time spent: 35 mins.More than 50% of that time was spent in counseling and/or coordination of care.      Shelly Coss, MD [facility] P10/12/2019, 8:31 AM

## 2020-05-25 NOTE — ED Notes (Signed)
Patient Terri Acosta had to be replaced.

## 2020-05-25 NOTE — Progress Notes (Signed)
Pt admitted to the unit from ED via bed with belongings to the side. Pt alert and verbally responsive. Pt oriented to the unit and from, fall/safety precaution and prevention education completed with pt and she voices understanding. Pt skin thin, frail, ecchymosis to BUE and pt has unna boot dsg to BLE which she report having sores on legs. 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 Pt has a band aid dsg to right face and scab to left ear. Telemetry applied and verified with NT to [place] changed and oral care done. Pt in bed with call light within reach and will report off to oncoming RN. Delia Heady RN   05/25/20 1743  Vitals  Temp 98.7 F (37.1 C)  Temp Source Oral  BP (!) 144/99  MAP (mmHg) 113  BP Location Left Arm  BP Method Automatic  Patient Position (if appropriate) Lying  Pulse Rate (!) 104  Resp 18  Level of Consciousness  Level of Consciousness Alert  MEWS COLOR  MEWS Score Color Green  Oxygen Therapy  SpO2 93 %  O2 Device Room Air  Pain Assessment  Pain Scale 0-10  Pain Score 0  MEWS Score  MEWS Temp 0  MEWS Systolic 0  MEWS Pulse 1  MEWS RR 0  MEWS LOC 0  MEWS Score 1

## 2020-05-25 NOTE — Evaluation (Signed)
Physical Therapy Evaluation Patient Details Name: Terri Acosta MRN: [id number] DOB: 03/22/1923 Today's Date: 05/25/2020   History of Present Illness  86 yo female admitted to ED on 10/4 with sudden onset R facial droop, slurred speech. MRI reveals patchy L MCA CVA. PMH includdes skin cancer, dizziness, afib with RVR, osteoporosis, HLD, HTN.    Clinical Impression  Pt presents with generalized weakness, decreased ROM L knee secondary to arthritic changes, difficulty performing mobility tasks, and decreased activity tolerance vs baseline. No focal neurologic weakness or abnormal tone noted, + mild R facial droop.  Pt to benefit from acute PT to address deficits. Pt required light assist for bed-level mobility tasks, pt soiled in urine upon PT arrival and required assist for linen change and pericare. Per pt, she has all the ADL and mobility assist she needs at ALF, but pt may benefit from HHPT evaluation post-acutely to maximize pt function during mobility tasks. PT eval also limited given pt's fatigue from bed mobility tasks, so PT unable to observe EOB or OOB activity. PT to progress mobility as tolerated, and will continue to follow acutely.      Follow Up Recommendations Supervision for mobility/OOB;Home health PT    Equipment Recommendations  None recommended by PT    Recommendations for Other Services       Precautions / Restrictions Precautions Precautions: Fall Restrictions Weight Bearing Restrictions: No      Mobility  Bed Mobility Overal bed mobility: Needs Assistance Bed Mobility: Rolling Rolling: Min assist         General bed mobility comments: min assist for rolling bilaterally for truncal translation, completion of roll into sidelying. PT assisting with pericare, as pt soiled in urine. Pt requiring total assist for boost up in bed with use of bed sheets and bed pad.  Transfers                 General transfer comment: NT  Ambulation/Gait              General Gait Details: nonambulatory  Stairs            Wheelchair Mobility    Modified Rankin (Stroke Patients Only)       Balance Overall balance assessment: Needs assistance     Sitting balance - Comments: able to clear back off bed with use of UEs, EOB sitting not assessed this day given fatigue from bed mobility                                     Pertinent Vitals/Pain Pain Assessment: No/denies pain    Home Living Family/patient expects to be discharged to:: Assisted living Living Arrangements: Alone Available Help at Discharge: Other (Comment) (ALF staff)   Home Access: Level entry     Home Layout: One level Home Equipment: Shower seat - built in;Wheelchair - manual      Prior Function Level of Independence: Needs assistance   Gait / Transfers Assistance Needed: Pt reports she is transfer-level with assist of ALF staff  ADL's / Homemaking Assistance Needed: assist for dressing, bathing; feeds self  Comments: previous PT notes states pt has power chair, per pt she has a traditional manual wheelchair     Hand Dominance   Dominant Hand: Right    Extremity/Trunk Assessment   Upper Extremity Assessment Upper Extremity Assessment: Defer to OT evaluation    Lower Extremity Assessment Lower Extremity Assessment:  Generalized weakness;LLE deficits/detail LLE Deficits / Details: arthritic changes at knee; limited knee flexion to ~40 degrees    Cervical / Trunk Assessment Cervical / Trunk Assessment: Other exceptions;Kyphotic Cervical / Trunk Exceptions: Holds head in L cervical rotation and mild cervical flexion  Communication   Communication: HOH  Cognition Arousal/Alertness: Awake/alert Behavior During Therapy: WFL for tasks assessed/performed Overall Cognitive Status: No family/caregiver present to determine baseline cognitive functioning                                 General Comments: Pt demonstrating  difficulty recalling where her children live, is frustrated by memory difficulties. Pt is pleasant, follows commands well.      General Comments General comments (skin integrity, edema, etc.): Afib rhythm with maxHR observed 143 bpm    Exercises     Assessment/Plan    PT Assessment Patient needs continued PT services  PT Problem List Decreased strength;Decreased mobility;Decreased activity tolerance;Decreased balance;Cardiopulmonary status limiting activity;Decreased cognition       PT Treatment Interventions DME instruction;Therapeutic exercise;Patient/family education;Therapeutic activities;Balance training;Functional mobility training;Neuromuscular re-education    PT Goals (Current goals can be found in the Care Plan section)  Acute Rehab PT Goals PT Goal Formulation: With patient Time For Goal Achievement: 06/08/20 Potential to Achieve Goals: Good    Frequency Min 3X/week   Barriers to discharge        Co-evaluation               AM-PAC PT "6 Clicks" Mobility  Outcome Measure Help needed turning from your back to your side while in a flat bed without using bedrails?: A Little Help needed moving from lying on your back to sitting on the side of a flat bed without using bedrails?: A Lot Help needed moving to and from a bed to a chair (including a wheelchair)?: A Lot Help needed standing up from a chair using your arms (e.g., wheelchair or bedside chair)?: Total Help needed to walk in hospital room?: Total Help needed climbing 3-5 steps with a railing? : Total 6 Click Score: 10    End of Session   Activity Tolerance: Patient limited by fatigue Patient left: in bed;with call bell/phone within reach Nurse Communication: Mobility status;Other (comment) (soiled in urine, PT performed full linen change) PT Visit Diagnosis: Other abnormalities of gait and mobility (R26.89);Muscle weakness (generalized) (M62.81)    Time: 1696-7893 PT Time Calculation (min) (ACUTE  ONLY): 31 min   Charges:   PT Evaluation $PT Eval Low Complexity: 1 Low PT Treatments $Therapeutic Activity: 8-22 mins       Yarieliz Wasser E, PT Acute Rehabilitation Services Pager [phone]  Office [phone]   Cayde Held D Somya Jauregui 05/25/2020, 11:25 AM

## 2020-05-25 NOTE — ED Notes (Signed)
Echo at bedside

## 2020-05-25 NOTE — ED Notes (Signed)
Ordered hospital bed--Terri Acosta 

## 2020-05-25 NOTE — Progress Notes (Signed)
Occupational therapy Evaluation  Pt from [facility] and states staff helps her with mobility and ADL. Pt is able to mobilize in her wc at baseline if needed and able to assist with grooming tasks and feeds herself. Pt should be able to DC back to her ALF with assist of staff when medically stable. Recommend follow up with HHOT after DC.     05/25/20 1200  OT Visit Information  Last OT Received On 05/25/20  Assistance Needed +2  History of Present Illness 86 yo female admitted to ED on 10/4 with sudden onset R facial droop, slurred speech. MRI reveals patchy L MCA CVA. PMH includdes skin cancer, dizziness, afib with RVR, osteoporosis, HLD, HTN.  Precautions  Precautions Fall  Home Living  Family/patient expects to be discharged to: Assisted living  Prior Function  Level of Independence Needs assistance  Gait / Transfers Assistance Needed Pt reports she is transfer-level with assist of ALF staff  ADL's / Homemaking Assistance Needed assist for dressing, bathing; feeds self  Communication  Communication HOH;Expressive difficulties ("slurry" speech)  Pain Assessment  Pain Assessment No/denies pain  Cognition  Arousal/Alertness Awake/alert  Behavior During Therapy WFL for tasks assessed/performed  Overall Cognitive Status No family/caregiver present to determine baseline cognitive functioning  General Comments following commands; slow processing; decreased memory  Upper Extremity Assessment  Upper Extremity Assessment RUE deficits/detail  RUE Deficits / Details increased difficulty with in-hand manipulation skills; "clumsy" movement pattern but using functionally  RUE Coordination decreased fine motor  Lower Extremity Assessment  Lower Extremity Assessment Defer to PT evaluation  LLE Deficits / Details arthritic changes at knee; limited knee flexion to ~40 degrees  Cervical / Trunk Assessment  Cervical / Trunk Assessment Kyphotic (L bias)  ADL  Overall ADL's  Needs  assistance/impaired  Eating/Feeding Set up;Supervision/ safety  Grooming Set up;Sitting  Upper Body Bathing Minimal assistance;Bed level  Lower Body Bathing Maximal assistance;Bed level  Upper Body Dressing  Moderate assistance;Sitting  Lower Body Dressing Total assistance  Toileting- Clothing Manipulation and Hygiene Total assistance (incontinenet)  Functional mobility during ADLs Moderate assistance  Vision- History  Baseline Vision/History Wears glasses  Vision- Assessment  Additional Comments Pt having difficulty hitting target with finger; will further assess  Perception  Comments appears intact  Praxis  Praxis-Other Comments appears intact  Bed Mobility  Overal bed mobility Needs Assistance  Bed Mobility Sit to Supine;Supine to Sit  Supine to sit Max assist  Sit to supine Max assist  Transfers  General transfer comment unable to assess due to height of stretcher/unsafe to attempt  Balance  Overall balance assessment Needs assistance  Sitting balance-Leahy Scale Poor  Sitting balance - Comments L lateral Lean; impacted by decreased attention  General Comments  General comments (skin integrity, edema, etc.) "I'm short of breath" with talking  OT - End of Session  Activity Tolerance Patient tolerated treatment well  Patient left in bed;with call bell/phone within reach (stretcher)  Nurse Communication Mobility status  OT Assessment  OT Recommendation/Assessment Patient needs continued OT Services  OT Visit Diagnosis Other abnormalities of gait and mobility (R26.89);Muscle weakness (generalized) (M62.81);Other symptoms and signs involving the nervous system (R29.898)  OT Problem List Decreased strength;Decreased activity tolerance;Impaired balance (sitting and/or standing);Decreased coordination;Decreased safety awareness;Decreased knowledge of use of DME or AE;Cardiopulmonary status limiting activity;Impaired UE functional use  OT Plan  OT Frequency (ACUTE ONLY) Min  2X/week  OT Treatment/Interventions (ACUTE ONLY) Self-care/ADL training;Therapeutic exercise;Neuromuscular education;Energy conservation;DME and/or AE instruction;Therapeutic activities;Cognitive remediation/compensation;Patient/family education;Balance  training  AM-PAC OT "6 Clicks" Daily Activity Outcome Measure (Version 2)  Help from another person eating meals? 3  Help from another person taking care of personal grooming? 3  Help from another person toileting, which includes using toliet, bedpan, or urinal? 1  Help from another person bathing (including washing, rinsing, drying)? 2  Help from another person to put on and taking off regular upper body clothing? 2  Help from another person to put on and taking off regular lower body clothing? 1  6 Click Score 12  OT Recommendation  Follow Up Recommendations Supervision/Assistance - 24 hour (REturn to ALF)  OT Equipment None recommended by OT  Individuals Consulted  Consulted and Agree with Results and Recommendations Patient  Acute Rehab OT Goals  Patient Stated Goal to go back to ALF  OT Goal Formulation With patient  Time For Goal Achievement 06/08/20  Potential to Achieve Goals Good  OT Time Calculation  OT Start Time (ACUTE ONLY) 1211  OT Stop Time (ACUTE ONLY) 1230  OT Time Calculation (min) 19 min  OT General Charges  $OT Visit 1 Visit  OT Evaluation  $OT Eval Low Complexity 1 Low  Written Expression  Dominant Hand Right  Maurie Boettcher, OT/L   Acute OT Clinical Specialist Acute Rehabilitation Services Pager [phone] Office [phone]

## 2020-05-25 NOTE — ED Notes (Signed)
Pt daughter at bedside

## 2020-05-26 DIAGNOSIS — L89154 Pressure ulcer of sacral region, stage 4: Secondary | ICD-10-CM

## 2020-05-26 DIAGNOSIS — J449 Chronic obstructive pulmonary disease, unspecified: Secondary | ICD-10-CM

## 2020-05-26 DIAGNOSIS — L89622 Pressure ulcer of left heel, stage 2: Secondary | ICD-10-CM | POA: Insufficient documentation

## 2020-05-26 LAB — CBC WITH DIFFERENTIAL/PLATELET
Abs Immature Granulocytes: 0.04 10*3/uL (ref 0.00–0.07)
Basophils Absolute: 0 10*3/uL (ref 0.0–0.1)
Basophils Relative: 0 %
Eosinophils Absolute: 0.1 10*3/uL (ref 0.0–0.5)
Eosinophils Relative: 1 %
HCT: 35.1 % — ABNORMAL LOW (ref 36.0–46.0)
Hemoglobin: 10.6 g/dL — ABNORMAL LOW (ref 12.0–15.0)
Immature Granulocytes: 0 %
Lymphocytes Relative: 11 %
Lymphs Abs: 1.1 10*3/uL (ref 0.7–4.0)
MCH: 26.7 pg (ref 26.0–34.0)
MCHC: 30.2 g/dL (ref 30.0–36.0)
MCV: 88.4 fL (ref 80.0–100.0)
Monocytes Absolute: 0.7 10*3/uL (ref 0.1–1.0)
Monocytes Relative: 6 %
Neutro Abs: 8.6 10*3/uL — ABNORMAL HIGH (ref 1.7–7.7)
Neutrophils Relative %: 82 %
Platelets: 279 10*3/uL (ref 150–400)
RBC: 3.97 MIL/uL (ref 3.87–5.11)
RDW: 15.7 % — ABNORMAL HIGH (ref 11.5–15.5)
WBC: 10.5 10*3/uL (ref 4.0–10.5)
nRBC: 0 % (ref 0.0–0.2)

## 2020-05-26 LAB — BASIC METABOLIC PANEL
Anion gap: 9 (ref 5–15)
BUN: 20 mg/dL (ref 8–23)
CO2: 31 mmol/L (ref 22–32)
Calcium: 8.9 mg/dL (ref 8.9–10.3)
Chloride: 102 mmol/L (ref 98–111)
Creatinine, Ser: 0.6 mg/dL (ref 0.44–1.00)
GFR calc non Af Amer: >60 mL/min (ref >60)
Glucose, Bld: 102 mg/dL — ABNORMAL HIGH (ref 70–99)
Potassium: 3.2 mmol/L — ABNORMAL LOW (ref 3.5–5.1)
Sodium: 142 mmol/L (ref 135–145)

## 2020-05-26 LAB — MAGNESIUM: Magnesium: 2 mg/dL (ref 1.7–2.4)

## 2020-05-26 LAB — RESPIRATORY PANEL BY RT PCR (FLU A&B, COVID)
Influenza A by PCR: NEGATIVE
Influenza B by PCR: NEGATIVE
SARS Coronavirus 2 by RT PCR: NEGATIVE

## 2020-05-26 MED ORDER — POTASSIUM CHLORIDE CRYS ER 20 MEQ PO TBCR
40.0000 meq | EXTENDED_RELEASE_TABLET | Freq: Two times a day (BID) | ORAL | Status: DC
  Administered 2020-05-26: 40 meq via ORAL
  Filled 2020-05-26: qty 2

## 2020-05-26 MED ORDER — INFLUENZA VAC A&B SA ADJ QUAD 0.5 ML IM PRSY
0.5000 mL | PREFILLED_SYRINGE | INTRAMUSCULAR | Status: AC
  Administered 2020-05-26: 0.5 mL via INTRAMUSCULAR
  Filled 2020-05-26: qty 0.5

## 2020-05-26 MED ORDER — APIXABAN 2.5 MG PO TABS: 2.5000 mg | ORAL_TABLET | Freq: Two times a day (BID) | ORAL | Status: DC

## 2020-05-26 MED ORDER — HYDROCERIN EX CREA: 1.0000 "application " | TOPICAL_CREAM | CUTANEOUS | 0 refills | Status: DC

## 2020-05-26 MED ORDER — ACETAMINOPHEN 325 MG PO TABS: 650.0000 mg | ORAL_TABLET | ORAL | Status: AC | PRN

## 2020-05-26 MED ORDER — ATORVASTATIN CALCIUM 20 MG PO TABS
20.0000 mg | ORAL_TABLET | Freq: Every day | ORAL | 0 refills | Status: DC
Start: 2020-05-27 — End: 2020-10-04

## 2020-05-26 MED ORDER — APIXABAN 2.5 MG PO TABS: 2.5000 mg | ORAL_TABLET | Freq: Two times a day (BID) | ORAL | 0 refills | Status: DC

## 2020-05-26 MED ORDER — HYDROCERIN EX CREA
TOPICAL_CREAM | CUTANEOUS | Status: DC
  Filled 2020-05-26: qty 113

## 2020-05-26 NOTE — Consult Note (Signed)
WOC Nurse Consult Note: Reason for Consult: LEs and sacrum Patient is to DC back to SNF today; learned this after removal of the bilateral LE dressings.  I did not assess the sacral wound for that reason as patient, patient's daughter are speaking with the attending MD currently  Wound type:venous stasis ulcer RLE; scabbed areas and history of dermatology intervention. Pressure Injury POA: Yes Measurement: RLE: 0.5cm x 0.5cm x 0.2cm; 100% pink, moist Wound bed:see above  Drainage (amount, consistency, odor) scant Periwound: dry scaly, venous skin changes bilaterally  Dressing procedure/placement/frequency: Apply Eucerin to the bilateral LE's (not between the toes) Cut to fit small piece of silver hydrofiber Kellie Simmering # [phone]) and place over the open wound on the right lateral LE. Pad dorsal foot with ABD pad bilaterally. Wrap from toes to knee with kerlix and then with 4" coban (lawson # [id number])with light stretch. Apply patient's stockinette over Coban bilaterally. Change every other day. Continue POC established by the wound care center for the sacral wound.   FU with the wound care center as scheduled.   Discussed POC with patient and bedside nurse.  Re consult if needed, will not follow at this time. Thanks  Jahshua Bonito [employer], RN,CWOCN, CNS, [place] [phone])

## 2020-05-26 NOTE — Discharge Instructions (Signed)

## 2020-05-26 NOTE — Discharge Summary (Signed)
Physician Discharge Summary  Terri Acosta:[id number] DOB: 1923/06/03 DOA: 05/24/2020  PCP: [name], [name], NP  Admit date: 05/24/2020 Discharge date: 05/26/2020  Admitted From: Home Disposition: ALF with Supervision PT/OT/RN  Recommendations for Outpatient Follow-up:  1. Follow up with PCP in 1-2 weeks 2. Follow up with Neurology in 4 weeks 3. Help with cardiology in 1 to 2 weeks 4. Please obtain CMP/CBC, Mag, Phos in one week 5. Please follow up on the following pending results:  Home Health: YES  Equipment/Devices: None recommended by PT/OT    Discharge Condition: Stable CODE STATUS: DO NOT RESUSCITATE Diet recommendation:   Brief/Interim Summary: HPI per Dr. Gean Birchwood on 05/24/20 Terri Acosta is a 86 y.o. female with history of A. fib presently off Eliquis since patient was concerned about falls, hypertension, anemia was brought to the ER after patient was found to have slurred speech and right facial droop.  Patient last known normal was around 3:30 PM today.  Did not have any difficulty with moving upper or lower extremities.  ED Course: In the ER patient was found to have right facial droop and slurred speech.  CT angiogram of the head and neck did not show any large vessel obstruction.  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  On-call neurology has been consulted.  Patient initially was in A. fib with RVR improved subsequently after taking home medications.  Labs are significant for WBC of 11.7 hemoglobin 10.3 Covid test is negative.  **Interim History Was worked up for acute CVA and neurology made recommendations to resume her Eliquis and this will be done tonight.  She has been deemed stable to be bit discharged after PT OT recommending home with. Currently she will need to follow-up with PCP, neurology as well as cardiology in outpatient setting.  Daughter was updated at bedside and she understands agrees with the plan of care.   Discharge Diagnoses:  Principal  Problem:   Acute CVA (cerebrovascular accident) [facility]) Active Problems:   Essential hypertension   COPD (chronic obstructive pulmonary disease) (HCC)   Atrial fibrillation with rapid ventricular response (HCC)   Stroke (HCC)   Pressure injury of skin  Acute Nonhemorrhagic CVA -Presented with slurred speech, right facial droop.  Lower extremity weakness.   -She was previously on Eliquis which had been stopped by her cardiologist on 02/25/2020 for concern of falls.   -MRI showed "Patchy small volume acute ischemic left MCA territory infarct involving the posterior left frontoparietal region. No associated hemorrhage or mass effect. Underlying age-related cerebral atrophy with moderate chronic small vessel ischemic disease, with small remote infarcts involving the left thalamus and left cerebellum." -Currently on aspirin but will transition back to Eliquis.   -Neurology following.   -Hemoglobin A1c of 5.4.  LDL of 79.  Continue Atorvastatin 20 mg po qHS.   -PT/OT consulted and recommended HH.   -Echo showed left ventricular ejection fraction of 26-83%, grade 2 diastolic dysfunction, no obvious intracardiac source of emboli. -Neurology recommending that the patient be restarted on Eliquis. -On her baseline, she ambulates with help of walker and is an Environmental consultant  living facility resident  A. fib with RVR -Was in RVR on presentation but improved.  -Her home medications have been restarted expecting improvement. -Was previously on Eliquis which has been stopped since July 2021 but will resume.   -TSH normal 2.095.   -Resumed Cardizem and Metoprolol  -Continue to Monitor on Telemetry while hospitalized -Follow up with Dr. Gardiner Rhyme in Cardiology as  an outpatient.  Normocytic Anemia -Currently hemoglobin stable and went from 11.2 -> 10.6.  -Continue to monitor for S/Sx of Bleeding now that she is being resumed on Eliquis; no overt bleeding noted -Repeat CBC within 1  week  Hypertension -Currently blood pressure stable.   -Allowed permissive hypertension and now resumed Metoprolol and Cardizem.   -Gradually normalize blood pressure in 3 to 5 days.  Continue as needed medications for severe hypertension. -Last BP reading was 148/95 this AM -Continue to Monitor Blood Pressures per Protocol   HLD -LDL was 79 with a Goal of <70 -C/w Atorvastatin 20 mg po qHS  Sacral Wound/Stage 4 Pressure Injury -poA -C/w Wound Care per [place] and contuinue POC established by [facility] for Sacral Wound  Bilaterlal LE Venous Stasis Ulcers -WOC Nurse evaluated -Per [facility] Nurse "Apply Eucerin to the bilateral LE's (not between the toes) Cut to fit small piece of silver hydrofiber Kellie Simmering # [id number]) and place over the open wound on the right lateral LE. Pad dorsal foot with ABD pad bilaterally. Wrap from toes to knee with kerlix and then with 4" coban (lawson # [id number])with light stretch. Apply patient's stockinette over Coban bilaterally. Change every other day. Continue POC established by the wound care center for the sacral wound."  Chronic Diastolic Grade 2 CHF -Currently not in exacerbation -Resume home metoprolol and home Demadex -Follow-up with cardiology within 1 to 2 weeks  COPD -Currently not in exacerbation. -Continue to Monitor Closely and C/w Incentive Spirometry   Hypokalemia -Patient's K+ was 3.2 this AM -Replete with po KCl 40 mEQ BID x2 -Continue to Monitor and Replete as Necessary -Repeat CMP within 1 week  Discharge Instructions  Discharge Instructions    ([place]) Call MD:  Anytime you have any of the following symptoms: 1) 3 pound weight gain in 24 hours or 5 pounds in 1 week 2) shortness of breath, with or without a dry hacking cough 3) swelling in the hands, feet or stomach 4) if you have to sleep on extra pillows at night in order to breathe.   Complete by: As directed    Ambulatory referral to Neurology   Complete  by: As directed    Follow up with stroke clinic NP (Jessica Vanschaick or Cecille Rubin, if both not available, consider Zachery Dauer, or [facility]) at [facility] in about 4 weeks. Thanks.   Call MD for:  difficulty breathing, headache or visual disturbances   Complete by: As directed    Call MD for:  extreme fatigue   Complete by: As directed    Call MD for:  hives   Complete by: As directed    Call MD for:  persistant dizziness or light-headedness   Complete by: As directed    Call MD for:  persistant nausea and vomiting   Complete by: As directed    Call MD for:  redness, tenderness, or signs of infection (pain, swelling, redness, odor or green/yellow discharge around incision site)   Complete by: As directed    Call MD for:  severe uncontrolled pain   Complete by: As directed    Call MD for:  temperature >100.4   Complete by: As directed    Diet - low sodium heart healthy   Complete by: As directed    Discharge instructions   Complete by: As directed    You were cared for by a hospitalist during your hospital stay. If you have any questions about your discharge medications or  the care you received while you were in the hospital after you are discharged, you can call the unit and ask to speak with the hospitalist on call if the hospitalist that took care of you is not available. Once you are discharged, your primary care physician will handle any further medical issues. Please note that NO REFILLS for any discharge medications will be authorized once you are discharged, as it is imperative that you return to your primary care physician (or establish a relationship with a primary care physician if you do not have one) for your aftercare needs so that they can reassess your need for medications and monitor your lab values.  Follow up with PCP, Neurology, and Cardiology within 1-2 weeks.Take all medications as prescribed. If symptoms change or worsen please return to the ED for evaluation    Discharge wound care:   Complete by: As directed    Apply Eucerin to the bilateral LE's (not between the toes) Cut to fit small piece of silver hydrofiber Kellie Simmering # [id number]) and place over the open wound on the right lateral LE. Pad dorsal foot with ABD pad bilaterally. Wrap from toes to knee with kerlix and then with 4" coban (lawson # [id number])with light stretch. Apply patient's stockinette over Coban bilaterally. Change every other day;  Coccyx wound care per [facility] nurse.   Increase activity slowly   Complete by: As directed      Allergies as of 05/26/2020      Reactions   Lomotil [diphenoxylate] Nausea And Vomiting   Sulfa Antibiotics Other (See Comments)   Pt unknown    Amlodipine Swelling      Medication List    TAKE these medications   acetaminophen 325 MG tablet Commonly known as: TYLENOL Take 2 tablets (650 mg total) by mouth every 4 (four) hours as needed for mild pain (or temp > 37.5 C (99.5 F)). What changed: reasons to take this   apixaban 2.5 MG Tabs tablet Commonly known as: ELIQUIS Take 1 tablet (2.5 mg total) by mouth 2 (two) times daily.   ARGINAID PO Take 237 mLs by mouth 2 (two) times daily.   ARGININE PO Take by mouth. Arginaid Extra (whey protein-arginine-c-e-zinc) 6-4.5-250 gram-gram-mg liquid. Twice A Day Between Meals 215mL, oral, Twice A Day Between Meals   Artificial Tears 0.1-0.3 % Soln Generic drug: Dextran 70-Hypromellose Apply 1 drop to eye in the morning, at noon, in the evening, and at bedtime. Both eyes   atorvastatin 20 MG tablet Commonly known as: LIPITOR Take 1 tablet (20 mg total) by mouth daily. Start taking on: May 27, 2020   diltiazem 120 MG 24 hr capsule Commonly known as: CARDIZEM CD Take 1 capsule (120 mg total) by mouth daily.   feeding supplement (PRO-STAT SUGAR FREE 64) Liqd Take 30 mLs by mouth 2 (two) times daily with a meal.   ferrous sulfate 325 (65 FE) MG tablet Take 325 mg by mouth. Once a day on Sunday, Tuesday,  Thursday and Saturday   hydrocerin Crea Apply 1 application topically every other day.   loperamide 2 MG capsule Commonly known as: IMODIUM Take 1 capsule (2 mg total) by mouth every 6 (six) hours as needed for diarrhea or loose stools.   magnesium oxide 400 MG tablet Commonly known as: MAG-OX Take 400 mg by mouth daily.   metoprolol tartrate 50 MG tablet Commonly known as: LOPRESSOR Take 75 mg by mouth 2 (two) times daily.   mirtazapine 7.5 MG tablet Commonly known as:  REMERON Take 7.5 mg by mouth every other day.   multivitamin-lutein Caps capsule Take 1 capsule by mouth in the morning and at bedtime.   potassium chloride 10 MEQ tablet Commonly known as: KLOR-CON Take 30 mEq by mouth daily.   saccharomyces boulardii 250 MG capsule Commonly known as: FLORASTOR Take 250 mg by mouth 2 (two) times daily.   torsemide 20 MG tablet Commonly known as: DEMADEX Take 20 mg by mouth every other day.   triamcinolone cream 0.1 % Commonly known as: KENALOG Apply 1 application topically every other day. Once a day. Apply to legs with esch dressing change.   vitamin B-12 1000 MCG tablet Commonly known as: CYANOCOBALAMIN Take 1,000 mcg by mouth daily.   zinc oxide 20 % ointment Apply 1 application topically as needed for irritation.            Discharge Care Instructions  (From admission, onward)         Start     Ordered   05/26/20 0000  Discharge wound care:       Comments: Apply Eucerin to the bilateral LE's (not between the toes) Cut to fit small piece of silver hydrofiber Kellie Simmering # [id number]) and place over the open wound on the right lateral LE. Pad dorsal foot with ABD pad bilaterally. Wrap from toes to knee with kerlix and then with 4" coban (lawson # [id number])with light stretch. Apply patient's stockinette over Coban bilaterally. Change every other day;  Coccyx wound care per [facility] nurse.   05/26/20 1111          Follow-up Information    [facility]. Schedule an appointment as soon as possible for a visit in 4 week(s).   Specialty: Neurology Contact information: [place] [phone]             Allergies  Allergen Reactions  . Lomotil [Diphenoxylate] Nausea And Vomiting  . Sulfa Antibiotics Other (See Comments)    Pt unknown   . Amlodipine Swelling    Consultations:  Neurology   Procedures/Studies: CT ANGIO HEAD W OR WO CONTRAST  Result Date: 05/24/2020 CLINICAL DATA:  Left facial droop and slurred speech. Negative acute head CT. EXAM: CT ANGIOGRAPHY HEAD AND NECK TECHNIQUE: Multidetector CT imaging of the head and neck was performed using the standard protocol during bolus administration of intravenous contrast. Multiplanar CT image reconstructions and MIPs were obtained to evaluate the vascular anatomy. Carotid stenosis measurements (when applicable) are obtained utilizing NASCET criteria, using the distal internal carotid diameter as the denominator. CONTRAST:  82mL OMNIPAQUE IOHEXOL 350 MG/ML SOLN COMPARISON:  Head CT same day FINDINGS: CTA NECK FINDINGS Aortic arch: Aortic atherosclerotic calcification. Normal branching pattern without origin stenosis. Right carotid system: Common carotid artery widely patent to the bifurcation region. Calcified plaque at the carotid bifurcation and ICA bulb but no stenosis. Cervical ICA is tortuous but widely patent. Left carotid system: Common carotid artery widely patent to the bifurcation region. Calcified plaque at the carotid bifurcation and ICA bulb. Minimal diameter of the distal ICA bulb measures 3.2 mm. Compared to a more distal cervical ICA diameter of 5 mm, this indicates a 35% stenosis. Vertebral arteries: Both vertebral artery origins are patent. Some calcified plaque in the proximal right vertebral artery but without stenosis greater than 30%. Beyond that, both vertebral arteries widely patent through the cervical  region to the foramen magnum. Skeleton: Chronic cervical spondylosis.  No acute finding. Other neck: No soft  tissue mass or lymphadenopathy. Upper chest: Negative Review of the MIP images confirms the above findings CTA HEAD FINDINGS Anterior circulation: Both internal carotid arteries widely patent through the skull base and siphon regions. Ordinary siphon atherosclerotic calcification without stenosis. The anterior and middle cerebral vessels are patent. No large or medium vessel occlusion. No correctable proximal stenosis. Mild atherosclerotic irregularity of the more distal branch vessels. Posterior circulation: Both vertebral arteries widely patent to the basilar. No basilar stenosis. Posterior circulation branch vessels are patent. There is atherosclerotic narrowing and irregularity of the more distal PCA branches. Venous sinuses: Patent and normal. Anatomic variants: None significant. Review of the MIP images confirms the above findings IMPRESSION: 1. No large or medium vessel occlusion. 2. Aortic atherosclerosis. 3. Atherosclerotic disease at both carotid bifurcations. 35% stenosis of the distal ICA bulb on the left. No measurable stenosis on the right. 4. Intracranial atherosclerotic narrowing and irregularity of the more distal branch vessels. 5. These results were communicated to Dr. Erlinda Hong At 5:40 pmon 10/4/2021by text page via the [facility] messaging system. Aortic Atherosclerosis (ICD10-I70.0). Electronically Signed   By: Nelson Chimes M.D.   On: 05/24/2020 17:40   CT ANGIO NECK W OR WO CONTRAST  Result Date: 05/24/2020 CLINICAL DATA:  Left facial droop and slurred speech. Negative acute head CT. EXAM: CT ANGIOGRAPHY HEAD AND NECK TECHNIQUE: Multidetector CT imaging of the head and neck was performed using the standard protocol during bolus administration of intravenous contrast. Multiplanar CT image reconstructions and MIPs were obtained to evaluate the vascular anatomy. Carotid stenosis measurements (when  applicable) are obtained utilizing NASCET criteria, using the distal internal carotid diameter as the denominator. CONTRAST:  4mL OMNIPAQUE IOHEXOL 350 MG/ML SOLN COMPARISON:  Head CT same day FINDINGS: CTA NECK FINDINGS Aortic arch: Aortic atherosclerotic calcification. Normal branching pattern without origin stenosis. Right carotid system: Common carotid artery widely patent to the bifurcation region. Calcified plaque at the carotid bifurcation and ICA bulb but no stenosis. Cervical ICA is tortuous but widely patent. Left carotid system: Common carotid artery widely patent to the bifurcation region. Calcified plaque at the carotid bifurcation and ICA bulb. Minimal diameter of the distal ICA bulb measures 3.2 mm. Compared to a more distal cervical ICA diameter of 5 mm, this indicates a 35% stenosis. Vertebral arteries: Both vertebral artery origins are patent. Some calcified plaque in the proximal right vertebral artery but without stenosis greater than 30%. Beyond that, both vertebral arteries widely patent through the cervical region to the foramen magnum. Skeleton: Chronic cervical spondylosis.  No acute finding. Other neck: No soft tissue mass or lymphadenopathy. Upper chest: Negative Review of the MIP images confirms the above findings CTA HEAD FINDINGS Anterior circulation: Both internal carotid arteries widely patent through the skull base and siphon regions. Ordinary siphon atherosclerotic calcification without stenosis. The anterior and middle cerebral vessels are patent. No large or medium vessel occlusion. No correctable proximal stenosis. Mild atherosclerotic irregularity of the more distal branch vessels. Posterior circulation: Both vertebral arteries widely patent to the basilar. No basilar stenosis. Posterior circulation branch vessels are patent. There is atherosclerotic narrowing and irregularity of the more distal PCA branches. Venous sinuses: Patent and normal. Anatomic variants: None  significant. Review of the MIP images confirms the above findings IMPRESSION: 1. No large or medium vessel occlusion. 2. Aortic atherosclerosis. 3. Atherosclerotic disease at both carotid bifurcations. 35% stenosis of the distal ICA bulb on the left. No measurable stenosis on the right. 4. Intracranial atherosclerotic narrowing and irregularity of the  more distal branch vessels. 5. These results were communicated to Dr. Erlinda Hong At 5:40 pmon 10/4/2021by text page via the [facility] messaging system. Aortic Atherosclerosis (ICD10-I70.0). Electronically Signed   By: Nelson Chimes M.D.   On: 05/24/2020 17:40   MR BRAIN WO CONTRAST  Result Date: 05/25/2020 CLINICAL DATA:  Follow-up examination for acute stroke. EXAM: MRI HEAD WITHOUT CONTRAST TECHNIQUE: Multiplanar, multiecho pulse sequences of the brain and surrounding structures were obtained without intravenous contrast. COMPARISON:  Prior CTs from 05/24/2020. FINDINGS: Brain: Diffuse prominence of the CSF containing spaces compatible with generalized cerebral atrophy. Patchy and confluent T2/FLAIR hyperintensity within the periventricular deep white matter both cerebral hemispheres most compatible chronic microvascular ischemic disease, moderate in nature. Small remote lacunar infarct noted at the left thalamus. Additional small remote left cerebellar infarct noted as well. Patchy restricted diffusion seen involving the cortical and subcortical aspect of the posterior left frontoparietal region, consistent with acute left MCA territory infarct. No associated hemorrhage or mass effect. No other evidence for acute or subacute ischemia. Gray-white matter differentiation otherwise maintained. Few additional punctate chronic micro hemorrhages noted at the left temporal occipital region and left cerebellum. No mass lesion, midline shift or mass effect. No hydrocephalus or extra-axial fluid collection. Pituitary gland and suprasellar region within normal limits. Midline structures  intact. Multiple prominent arachnoid pits noted at the occipital calvarium. Vascular: Major intracranial vascular flow voids are maintained. Skull and upper cervical spine: Craniocervical junction within normal limits. Bone marrow signal intensity normal. No scalp soft tissue abnormality. Sinuses/Orbits: Patient status post bilateral ocular lens replacement. Paranasal sinuses are clear. No mastoid effusion. Other: None. IMPRESSION: 1. Patchy small volume acute ischemic left MCA territory infarct involving the posterior left frontoparietal region. No associated hemorrhage or mass effect. 2. Underlying age-related cerebral atrophy with moderate chronic small vessel ischemic disease, with small remote infarcts involving the left thalamus and left cerebellum. Electronically Signed   By: Jeannine Boga M.D.   On: 05/25/2020 01:34   DG Chest Portable 1 View  Result Date: 05/24/2020 CLINICAL DATA:  Weakness. EXAM: PORTABLE CHEST 1 VIEW COMPARISON:  Jan 19, 2020 FINDINGS: There is cardiomegaly. There is a moderate-sized left-sided pleural effusion. There is a small right-sided pleural effusion. There is no pneumothorax. There is atelectasis at the lung bases. There are atherosclerotic changes of the thoracic aorta. There is no acute osseous abnormality. IMPRESSION: 1. Cardiomegaly with bilateral pleural effusions, moderate on the left and small on the right. 2. Bibasilar atelectasis. Electronically Signed   By: Constance Holster M.D.   On: 05/24/2020 19:09   ECHOCARDIOGRAM COMPLETE  Result Date: 05/25/2020    ECHOCARDIOGRAM REPORT   Patient Name:   [name] Date of Exam: 05/25/2020 Medical Rec #:  [id number]     Height:       62.0 in Accession #:    [id number]    Weight:       106.9 lb Date of Birth:  1923-04-23     BSA:          1.465 m Patient Age:    86 years      BP:           118/73 mmHg Patient Gender: F             HR:           113 bpm. Exam Location:  Inpatient Procedure: 2D Echo, Cardiac Doppler  and Color Doppler Indications:    TIA 435.9 / G45.9  History:  Patient has prior history of Echocardiogram examinations, most                 recent 01/22/2020. Signs/Symptoms:Shortness of Breath; Risk                 Factors:Hypertension and Dyslipidemia.  Sonographer:    Bernadene Person RDCS Referring Phys: [place]  1. Left ventricular ejection fraction, by estimation, is 60 to 65%. The left ventricle has normal function. The left ventricle has no regional wall motion abnormalities. Left ventricular diastolic parameters are consistent with Grade II diastolic dysfunction (pseudonormalization). Elevated left ventricular end-diastolic pressure.  2. Right ventricular systolic function is normal. The right ventricular size is normal. There is mildly elevated pulmonary artery systolic pressure.  3. Left atrial size was severely dilated.  4. Right atrial size was mildly dilated.  5. Moderate pleural effusion in the left lateral region.  6. The mitral valve is normal in structure. Mild mitral valve regurgitation. No evidence of mitral stenosis.  7. The aortic valve is normal in structure. Aortic valve regurgitation is trivial. No aortic stenosis is present.  8. The inferior vena cava is normal in size with greater than 50% respiratory variability, suggesting right atrial pressure of 3 mmHg. FINDINGS  Left Ventricle: Left ventricular ejection fraction, by estimation, is 60 to 65%. The left ventricle has normal function. The left ventricle has no regional wall motion abnormalities. The left ventricular internal cavity size was normal in size. There is  no left ventricular hypertrophy. Left ventricular diastolic parameters are consistent with Grade II diastolic dysfunction (pseudonormalization). Elevated left ventricular end-diastolic pressure. Right Ventricle: The right ventricular size is normal. No increase in right ventricular wall thickness. Right ventricular systolic function is normal.  There is mildly elevated pulmonary artery systolic pressure. The tricuspid regurgitant velocity is 3.19  m/s, and with an assumed right atrial pressure of 3 mmHg, the estimated right ventricular systolic pressure is 78.4 mmHg. Left Atrium: Left atrial size was severely dilated. Right Atrium: Right atrial size was mildly dilated. Pericardium: There is no evidence of pericardial effusion. Mitral Valve: The mitral valve is normal in structure. Mild mitral annular calcification. Mild mitral valve regurgitation. No evidence of mitral valve stenosis. Tricuspid Valve: The tricuspid valve is normal in structure. Tricuspid valve regurgitation is mild . No evidence of tricuspid stenosis. Aortic Valve: The aortic valve is normal in structure. Aortic valve regurgitation is trivial. Aortic regurgitation PHT measures 288 msec. No aortic stenosis is present. Pulmonic Valve: The pulmonic valve was normal in structure. Pulmonic valve regurgitation is not visualized. No evidence of pulmonic stenosis. Aorta: The aortic root is normal in size and structure. Venous: The inferior vena cava is normal in size with greater than 50% respiratory variability, suggesting right atrial pressure of 3 mmHg. IAS/Shunts: No atrial level shunt detected by color flow Doppler. Additional Comments: There is a moderate pleural effusion in the left lateral region.  LEFT VENTRICLE PLAX 2D LVIDd:         3.30 cm     Diastology LVIDs:         2.62 cm     LV e' medial:    6.05 cm/s LV PW:         0.97 cm     LV E/e' medial:  19.7 LV IVS:        0.90 cm     LV e' lateral:   7.36 cm/s LVOT diam:     1.80 cm  LV E/e' lateral: 16.2 LV SV:         40 LV SV Index:   27 LVOT Area:     2.54 cm  LV Volumes (MOD) LV vol d, MOD A2C: 28.7 ml LV vol d, MOD A4C: 32.7 ml LV vol s, MOD A2C: 12.7 ml LV vol s, MOD A4C: 15.8 ml LV SV MOD A2C:     16.0 ml LV SV MOD A4C:     32.7 ml LV SV MOD BP:      16.5 ml RIGHT VENTRICLE RV S prime:     6.84 cm/s TAPSE (M-mode): 1.4 cm  LEFT ATRIUM             Index       RIGHT ATRIUM           Index LA diam:        2.50 cm 1.71 cm/m  RA Area:     19.90 cm LA Vol (A2C):   69.5 ml 47.44 ml/m RA Volume:   46.60 ml  31.81 ml/m LA Vol (A4C):   47.8 ml 32.63 ml/m LA Biplane Vol: 59.6 ml 40.68 ml/m  AORTIC VALVE LVOT Vmax:   90.10 cm/s LVOT Vmean:  56.300 cm/s LVOT VTI:    0.157 m AI PHT:      288 msec  AORTA Ao Root diam: 3.40 cm Ao Asc diam:  3.40 cm MITRAL VALVE                 TRICUSPID VALVE MV Area (PHT): 9.60 cm      TR Peak grad:   40.7 mmHg MV Decel Time: 79 msec       TR Vmax:        319.00 cm/s MR Peak grad:    124.1 mmHg MR Mean grad:    79.0 mmHg   SHUNTS MR Vmax:         557.00 cm/s Systemic VTI:  0.16 m MR Vmean:        423.0 cm/s  Systemic Diam: 1.80 cm MR PISA:         0.39 cm MR PISA Eff ROA: 3 mm MR PISA Radius:  0.25 cm MV E velocity: 119.00 cm/s MV A velocity: 79.90 cm/s MV E/A ratio:  1.49 Skeet Latch MD Electronically signed by Skeet Latch MD Signature Date/Time: 05/25/2020/12:14:19 PM    Final    CT HEAD CODE STROKE WO CONTRAST  Result Date: 05/24/2020 CLINICAL DATA:  Code stroke.  Acute neurological deficit. EXAM: CT HEAD WITHOUT CONTRAST TECHNIQUE: Contiguous axial images were obtained from the base of the skull through the vertex without intravenous contrast. COMPARISON:  None. FINDINGS: Brain: Age related atrophy. Chronic small-vessel ischemic changes throughout the cerebral hemispheric white matter. No sign of acute infarction, mass lesion, hemorrhage, hydrocephalus or extra-axial collection. Vascular: There is atherosclerotic calcification of the major vessels at the base of the brain. Skull: Negative Sinuses/Orbits: Clear/normal Other: None ASPECTS ([place] Stroke Program Early CT Score) - Ganglionic level infarction (caudate, lentiform nuclei, internal capsule, insula, M1-M3 cortex): 7 - Supraganglionic infarction (M4-M6 cortex): 3 Total score (0-10 with 10 being normal): 10 IMPRESSION: 1. No acute  finding by CT. Atrophy and chronic small-vessel ischemic changes. 2. ASPECTS is 10. 3. These results were communicated to Dr. Erlinda Hong At 5:23 pmon 10/4/2021by text page via the [facility] messaging system. Electronically Signed   By: Nelson Chimes M.D.   On: 05/24/2020 17:24   ECHOCARDIOGRAM 05/25/20 IMPRESSIONS    1. Left ventricular  ejection fraction, by estimation, is 60 to 65%. The  left ventricle has normal function. The left ventricle has no regional  wall motion abnormalities. Left ventricular diastolic parameters are  consistent with Grade II diastolic  dysfunction (pseudonormalization). Elevated left ventricular end-diastolic  pressure.  2. Right ventricular systolic function is normal. The right ventricular  size is normal. There is mildly elevated pulmonary artery systolic  pressure.  3. Left atrial size was severely dilated.  4. Right atrial size was mildly dilated.  5. Moderate pleural effusion in the left lateral region.  6. The mitral valve is normal in structure. Mild mitral valve  regurgitation. No evidence of mitral stenosis.  7. The aortic valve is normal in structure. Aortic valve regurgitation is  trivial. No aortic stenosis is present.  8. The inferior vena cava is normal in size with greater than 50%  respiratory variability, suggesting right atrial pressure of 3 mmHg.   FINDINGS  Left Ventricle: Left ventricular ejection fraction, by estimation, is 60  to 65%. The left ventricle has normal function. The left ventricle has no  regional wall motion abnormalities. The left ventricular internal cavity  size was normal in size. There is  no left ventricular hypertrophy. Left ventricular diastolic parameters  are consistent with Grade II diastolic dysfunction (pseudonormalization).  Elevated left ventricular end-diastolic pressure.   Right Ventricle: The right ventricular size is normal. No increase in  right ventricular wall thickness. Right ventricular systolic  function is  normal. There is mildly elevated pulmonary artery systolic pressure. The  tricuspid regurgitant velocity is 3.19  m/s, and with an assumed right atrial pressure of 3 mmHg, the estimated  right ventricular systolic pressure is 54.6 mmHg.   Left Atrium: Left atrial size was severely dilated.   Right Atrium: Right atrial size was mildly dilated.   Pericardium: There is no evidence of pericardial effusion.   Mitral Valve: The mitral valve is normal in structure. Mild mitral annular  calcification. Mild mitral valve regurgitation. No evidence of mitral  valve stenosis.   Tricuspid Valve: The tricuspid valve is normal in structure. Tricuspid  valve regurgitation is mild . No evidence of tricuspid stenosis.   Aortic Valve: The aortic valve is normal in structure. Aortic valve  regurgitation is trivial. Aortic regurgitation PHT measures 288 msec. No  aortic stenosis is present.   Pulmonic Valve: The pulmonic valve was normal in structure. Pulmonic valve  regurgitation is not visualized. No evidence of pulmonic stenosis.   Aorta: The aortic root is normal in size and structure.   Venous: The inferior vena cava is normal in size with greater than 50%  respiratory variability, suggesting right atrial pressure of 3 mmHg.   IAS/Shunts: No atrial level shunt detected by color flow Doppler.   Additional Comments: There is a moderate pleural effusion in the left  lateral region.     LEFT VENTRICLE  PLAX 2D  LVIDd:     3.30 cm   Diastology  LVIDs:     2.62 cm   LV e' medial:  6.05 cm/s  LV PW:     0.97 cm   LV E/e' medial: 19.7  LV IVS:    0.90 cm   LV e' lateral:  7.36 cm/s  LVOT diam:   1.80 cm   LV E/e' lateral: 16.2  LV SV:     40  LV SV Index:  27  LVOT Area:   2.54 cm    LV Volumes (MOD)  LV vol d, MOD  A2C: 28.7 ml  LV vol d, MOD A4C: 32.7 ml  LV vol s, MOD A2C: 12.7 ml  LV vol s, MOD A4C: 15.8 ml  LV SV MOD A2C:    16.0 ml  LV SV MOD A4C:   32.7 ml  LV SV MOD BP:   16.5 ml   RIGHT VENTRICLE  RV S prime:   6.84 cm/s  TAPSE (M-mode): 1.4 cm   LEFT ATRIUM       Index    RIGHT ATRIUM      Index  LA diam:    2.50 cm 1.71 cm/m RA Area:   19.90 cm  LA Vol (A2C):  69.5 ml 47.44 ml/m RA Volume:  46.60 ml 31.81 ml/m  LA Vol (A4C):  47.8 ml 32.63 ml/m  LA Biplane Vol: 59.6 ml 40.68 ml/m  AORTIC VALVE  LVOT Vmax:  90.10 cm/s  LVOT Vmean: 56.300 cm/s  LVOT VTI:  0.157 m  AI PHT:   288 msec    AORTA  Ao Root diam: 3.40 cm  Ao Asc diam: 3.40 cm   MITRAL VALVE         TRICUSPID VALVE  MV Area (PHT): 9.60 cm   TR Peak grad:  40.7 mmHg  MV Decel Time: 79 msec    TR Vmax:    319.00 cm/s  MR Peak grad:  124.1 mmHg  MR Mean grad:  79.0 mmHg  SHUNTS  MR Vmax:     557.00 cm/s Systemic VTI: 0.16 m  MR Vmean:    423.0 cm/s Systemic Diam: 1.80 cm  MR PISA:     0.39 cm  MR PISA Eff ROA: 3 mm  MR PISA Radius: 0.25 cm  MV E velocity: 119.00 cm/s  MV A velocity: 79.90 cm/s  MV E/A ratio: 1.49   Subjective: Seen and examined at bedside and states that she is feeling better.  Denies chest pain, lightheadedness or dizziness.  No nausea or vomiting.  Feels much better and not slurring her speech anymore.  No other concerns or complaints at this time.  Discharge Exam: Vitals:   05/26/20 0353 05/26/20 0749  BP: 132/75 (!) 148/95  Pulse: (!) 45 93  Resp: 16 16  Temp: 98 F (36.7 C) 98 F (36.7 C)  SpO2: 94% 93%   Vitals:   05/25/20 2052 05/25/20 2331 05/26/20 0353 05/26/20 0749  BP: 125/87 122/76 132/75 (!) 148/95  Pulse: (!) 102 80 (!) 45 93  Resp: 16 16 16 16   Temp: 98 F (36.7 C) 98.1 F (36.7 C) 98 F (36.7 C) 98 F (36.7 C)  TempSrc: Oral Oral Oral Oral  SpO2: 96% 93% 94% 93%  Weight:      Height:       General: Pt is alert, awake, not in acute distress Cardiovascular: Irregularly irregular mildly  tachycardic, S1/S2 +, no rubs, no gallops Respiratory: Diminished bilaterally, no wheezing, no rhonchi Abdominal: Soft, NT, ND, bowel sounds + Extremities: no appreciable edema both lower extremities are wrapped, no cyanosis  The results of significant diagnostics from this hospitalization (including imaging, microbiology, ancillary and laboratory) are listed below for reference.    Microbiology: Recent Results (from the past 240 hour(s))  Respiratory Panel by RT PCR (Flu A&B, Covid) - Urine, Clean Catch     Status: None   Collection Time: 05/24/20  6:42 PM   Specimen: Urine, Clean Catch; Nasopharyngeal  Result Value Ref Range Status   SARS Coronavirus 2 by RT PCR NEGATIVE NEGATIVE Final  Comment: (NOTE) SARS-CoV-2 target nucleic acids are NOT DETECTED.  The SARS-CoV-2 RNA is generally detectable in upper respiratoy specimens during the acute phase of infection. The lowest concentration of SARS-CoV-2 viral copies this assay can detect is 131 copies/mL. A negative result does not preclude SARS-Cov-2 infection and should not be used as the sole basis for treatment or other patient management decisions. A negative result may occur with  improper specimen collection/handling, submission of specimen other than nasopharyngeal swab, presence of viral mutation(s) within the areas targeted by this assay, and inadequate number of viral copies (<131 copies/mL). A negative result must be combined with clinical observations, patient history, and epidemiological information. The expected result is Negative.  Fact Sheet for Patients:  PinkCheek.be  Fact Sheet for Healthcare Providers:  GravelBags.it  This test is no t yet approved or cleared by the [place] FDA and  has been authorized for detection and/or diagnosis of SARS-CoV-2 by FDA under an Emergency Use Authorization (EUA). This EUA will remain  in effect (meaning this test  can be used) for the duration of the COVID-19 declaration under Section 564(b)(1) of the Act, 21 U.S.C. section 360bbb-3(b)(1), unless the authorization is terminated or revoked sooner.     Influenza A by PCR NEGATIVE NEGATIVE Final   Influenza B by PCR NEGATIVE NEGATIVE Final    Comment: (NOTE) The Xpert Xpress SARS-CoV-2/FLU/RSV assay is intended as an aid in  the diagnosis of influenza from Nasopharyngeal swab specimens and  should not be used as a sole basis for treatment. Nasal washings and  aspirates are unacceptable for Xpert Xpress SARS-CoV-2/FLU/RSV  testing.  Fact Sheet for Patients: PinkCheek.be  Fact Sheet for Healthcare Providers: GravelBags.it  This test is not yet approved or cleared by the [place] FDA and  has been authorized for detection and/or diagnosis of SARS-CoV-2 by  FDA under an Emergency Use Authorization (EUA). This EUA will remain  in effect (meaning this test can be used) for the duration of the  Covid-19 declaration under Section 564(b)(1) of the Act, 21  U.S.C. section 360bbb-3(b)(1), unless the authorization is  terminated or revoked. Performed at [facility], [place] [address] [place], [place] [address]      Labs: BNP (last 3 results) No results for input(s): BNP in the last 8760 hours. Basic Metabolic Panel: Recent Labs  Lab 05/24/20 1741 05/24/20 1756 05/26/20 0322 05/26/20 0828  NA 143 142 142  --   K 3.6 3.5 3.2*  --   CL 102 102 102  --   CO2 29  --  31  --   GLUCOSE 117* 113* 102*  --   BUN 26* 29* 20  --   CREATININE 0.68 0.60 0.60  --   CALCIUM 8.8*  --  8.9  --   MG  --   --   --  2.0   Liver Function Tests: Recent Labs  Lab 05/24/20 1741  AST 20  ALT 17  ALKPHOS 63  BILITOT 0.7  PROT 6.3*  ALBUMIN 2.9*   No results for input(s): LIPASE, AMYLASE in the last 168 hours. No results for input(s): AMMONIA in the last 168 hours. CBC: Recent Labs   Lab 05/24/20 1741 05/24/20 1756 05/26/20 0823  WBC 11.7*  --  10.5  NEUTROABS 9.5*  --  8.6*  HGB 10.3* 11.2* 10.6*  HCT 35.1* 33.0* 35.1*  MCV 88.4  --  88.4  PLT 265  --  279   Cardiac Enzymes: No results  for input(s): CKTOTAL, CKMB, CKMBINDEX, TROPONINI in the last 168 hours. BNP: Invalid input(s): POCBNP CBG: Recent Labs  Lab 05/24/20 1712  GLUCAP 116*   D-Dimer No results for input(s): DDIMER in the last 72 hours. Hgb A1c Recent Labs    05/25/20 0654  HGBA1C 5.4   Lipid Profile Recent Labs    05/25/20 0654  CHOL 133  HDL 39*  LDLCALC 79  TRIG 73  CHOLHDL 3.4   Thyroid function studies Recent Labs    05/25/20 0654  TSH 2.095   Anemia work up No results for input(s): VITAMINB12, FOLATE, FERRITIN, TIBC, IRON, RETICCTPCT in the last 72 hours. Urinalysis    Component Value Date/Time   COLORURINE COLORLESS (A) 05/24/2020 1842   APPEARANCEUR CLEAR 05/24/2020 1842   LABSPEC 1.012 05/24/2020 1842   PHURINE 5.0 05/24/2020 1842   GLUCOSEU NEGATIVE 05/24/2020 1842   HGBUR NEGATIVE 05/24/2020 1842   BILIRUBINUR NEGATIVE 05/24/2020 1842   BILIRUBINUR negative 07/02/2018 1700   KETONESUR NEGATIVE 05/24/2020 1842   PROTEINUR NEGATIVE 05/24/2020 1842   UROBILINOGEN 0.2 07/02/2018 1700   NITRITE NEGATIVE 05/24/2020 1842   LEUKOCYTESUR NEGATIVE 05/24/2020 1842   Sepsis Labs Invalid input(s): PROCALCITONIN,  WBC,  LACTICIDVEN Microbiology Recent Results (from the past 240 hour(s))  Respiratory Panel by RT PCR (Flu A&B, Covid) - Urine, Clean Catch     Status: None   Collection Time: 05/24/20  6:42 PM   Specimen: Urine, Clean Catch; Nasopharyngeal  Result Value Ref Range Status   SARS Coronavirus 2 by RT PCR NEGATIVE NEGATIVE Final    Comment: (NOTE) SARS-CoV-2 target nucleic acids are NOT DETECTED.  The SARS-CoV-2 RNA is generally detectable in upper respiratoy specimens during the acute phase of infection. The lowest concentration of SARS-CoV-2 viral  copies this assay can detect is 131 copies/mL. A negative result does not preclude SARS-Cov-2 infection and should not be used as the sole basis for treatment or other patient management decisions. A negative result may occur with  improper specimen collection/handling, submission of specimen other than nasopharyngeal swab, presence of viral mutation(s) within the areas targeted by this assay, and inadequate number of viral copies (<131 copies/mL). A negative result must be combined with clinical observations, patient history, and epidemiological information. The expected result is Negative.  Fact Sheet for Patients:  PinkCheek.be  Fact Sheet for Healthcare Providers:  GravelBags.it  This test is no t yet approved or cleared by the [place] FDA and  has been authorized for detection and/or diagnosis of SARS-CoV-2 by FDA under an Emergency Use Authorization (EUA). This EUA will remain  in effect (meaning this test can be used) for the duration of the COVID-19 declaration under Section 564(b)(1) of the Act, 21 U.S.C. section 360bbb-3(b)(1), unless the authorization is terminated or revoked sooner.     Influenza A by PCR NEGATIVE NEGATIVE Final   Influenza B by PCR NEGATIVE NEGATIVE Final    Comment: (NOTE) The Xpert Xpress SARS-CoV-2/FLU/RSV assay is intended as an aid in  the diagnosis of influenza from Nasopharyngeal swab specimens and  should not be used as a sole basis for treatment. Nasal washings and  aspirates are unacceptable for Xpert Xpress SARS-CoV-2/FLU/RSV  testing.  Fact Sheet for Patients: PinkCheek.be  Fact Sheet for Healthcare Providers: GravelBags.it  This test is not yet approved or cleared by the [place] FDA and  has been authorized for detection and/or diagnosis of SARS-CoV-2 by  FDA under an Emergency Use Authorization (EUA). This  EUA will remain  in effect (meaning this test can be used) for the duration of the  Covid-19 declaration under Section 564(b)(1) of the Act, 21  U.S.C. section 360bbb-3(b)(1), unless the authorization is  terminated or revoked. Performed at [facility], [place] [address], [place], [place] [address]    Time coordinating discharge: 35 minutes  SIGNED:  Kerney Elbe, DO [facility] 05/26/2020, 11:11 AM Pager is on [place]  If 7PM-7AM, please contact night-coverage [email or web address]

## 2020-05-26 NOTE — TOC Transition Note (Signed)
Transition of Care [facility]) - CM/SW Discharge Note   Patient Details  Name: Terri Acosta MRN: [id number] Date of Birth: [birth year]  Transition of [facility]) CM/SW Contact:  Marney Setting, [place] Work Phone Number: 05/26/2020, 1:25 PM   Clinical Narrative:   Nurse to call report to [phone] ext. [phone] Rm.9    Final next level of care: [place] Barriers to Discharge: Barriers Resolved   Patient Goals and CMS Choice        Discharge Placement              Patient chooses bed at: [facility] Patient to be transferred to facility by: [place] Name of family member notified: Santiago Glad Patient and family notified of of transfer: 05/26/20  Discharge Plan and Services                                     Social Determinants of Health (SDOH) Interventions     Readmission Risk Interventions No flowsheet data found.

## 2020-05-26 NOTE — Progress Notes (Signed)
Pt picked up by [facility] to be transported off unit to disposition. Pt transported off unit via stretcher with belongings to the side. Delia Heady RN

## 2020-05-26 NOTE — NC FL2 (Addendum)
[place] LEVEL OF CARE SCREENING TOOL     IDENTIFICATION  Patient Name: Terri Acosta Birthdate: [birth year] Sex: female Admission Date (Current Location): 05/24/2020  [facility] [place] Number:  Herbalist and Address:  [facility], [place] [address] [place], [place] [address]      Provider Number: [id number]  Attending Physician Name and Address:  Kerney Elbe, DO  Relative Name and Phone Number:  Santiago Glad    Current Level of Care: Hospital Recommended Level of Care: [place] Prior Approval Number:    Date Approved/Denied:   PASRR Number: [id number]  Discharge Plan: SNF    Current Diagnoses: Patient Active Problem List   Diagnosis Date Noted  . Pressure injury of skin 05/26/2020  . Acute CVA (cerebrovascular accident) ([place]) 05/25/2020  . Stroke ([place]) 05/25/2020  . Stroke-like symptom 05/24/2020  . Dry eyes 05/21/2020  . Venous stasis ulcer of left lower leg with edema of left lower leg ([place]) 03/16/2020  . Anemia 03/08/2020  . Hypomagnesemia 02/27/2020  . Pressure ulcer 02/24/2020  . Insomnia secondary to depression with anxiety 02/06/2020  . Blepharitis of both eyes 02/06/2020  . Acute gastroenteritis 01/19/2020  . Diarrhea 01/18/2020  . Atrial fibrillation with rapid ventricular response ([place]) 01/18/2020  . Hypokalemia 01/18/2020  . Dehydration   . Vaginal atrophy 01/15/2019  . Cyst of right kidney 07/27/2017  . Knee pain, chronic 12/26/2016  . Unstable gait 07/04/2016  . COPD (chronic obstructive pulmonary disease) ([place]) 08/04/2014  . Palpitations 02/13/2012  . Edema 12/19/2011  . Hyperglycemia 03/14/2011  . Hyperlipidemia 10/18/2010  . Disorder of bone and cartilage 02/18/2007  . Essential hypertension 08/20/2006  . Osteoarthritis 08/20/2006    Orientation RESPIRATION BLADDER Height & Weight     Self, Place  Normal Incontinent Weight: 106 lb 14.8 oz (48.5 kg) Height:  5\' 2"   (157.5 cm)  BEHAVIORAL SYMPTOMS/MOOD NEUROLOGICAL BOWEL NUTRITION STATUS      Continent Diet (See DC Summary)  AMBULATORY STATUS COMMUNICATION OF NEEDS Skin   Extensive Assist Verbally PU Stage and Appropriate Care       PU Stage 4 Dressing:  (Coccyx Posterior; Mid. 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 (feeding supplement) PROSource Plus liquid 30 mL  30 mL Oral BID BM Tawanna Solo, Amrit, MD   30 mL at 05/26/20 0917  . acetaminophen (TYLENOL) tablet 650 mg  650 mg Oral Q4H PRN Rise Patience, MD       Or  . acetaminophen (TYLENOL) 160 MG/5ML solution 650 mg  650 mg Per  Tube Q4H PRN Rise Patience, MD       Or  . acetaminophen (TYLENOL) suppository 650 mg  650 mg Rectal Q4H PRN Rise Patience, MD      . apixaban Arne [place]) tablet 2.5 mg  2.5 mg Oral BID Sheikh, Omair Latif, DO      . atorvastatin (LIPITOR) tablet 20 mg  20 mg Oral Daily Rosalin Hawking, MD   20 mg at 05/26/20 0917  . diltiazem (CARDIZEM CD) 24 hr capsule 120 mg  120 mg Oral Daily Rise Patience, MD   120 mg at 05/26/20 1027  . hydrocerin (EUCERIN) cream   Topical QODAY Sheikh,  Omair Latif, DO      . magnesium oxide (MAG-OX) tablet 400 mg  400 mg Oral Daily Rise Patience, MD   400 mg at 05/26/20 0917  . metoprolol tartrate (LOPRESSOR) tablet 75 mg  75 mg Oral BID Rise Patience, MD   75 mg at 05/26/20 0917  . mirtazapine (REMERON) tablet 7.5 mg  7.5 mg Oral Joellyn Quails, MD   7.5 mg at 05/26/20 0917  . potassium chloride SA (KLOR-CON) CR tablet 40 mEq  40 mEq Oral BID Raiford Noble [place], DO   40 mEq at 05/26/20 2536  . saccharomyces boulardii (FLORASTOR) capsule 250 mg  250 mg Oral BID Rise Patience, MD   250 mg at 05/26/20 6440  . vitamin B-12 (CYANOCOBALAMIN) tablet 1,000 mcg  1,000 mcg Oral Daily Rise Patience, MD   1,000 mcg at 05/26/20 3474     Discharge Medications: Please see discharge summary for a list of discharge medications.  Relevant Imaging Results:  Relevant Lab Results:   Additional Information SSN #:[id number]  Marney Setting, Student-Social Work

## 2020-05-26 NOTE — Progress Notes (Signed)
Pt discharge education and instructions completed. Pt discharged back to [facility] SNF and report called off to nurse Khidjah. BLE dsg changed prior to discharge; Pt IV and telemetry removed. Pt awaiting on [facility] to transport off to disposition. Will continue to closely monitor. Delia Heady RN

## 2020-05-27 ENCOUNTER — Non-Acute Institutional Stay (SKILLED_NURSING_FACILITY): Payer: Medicare Other | Admitting: Internal Medicine

## 2020-05-27 ENCOUNTER — Encounter: Payer: Self-pay | Admitting: Internal Medicine

## 2020-05-27 DIAGNOSIS — I83029 Varicose veins of left lower extremity with ulcer of unspecified site: Secondary | ICD-10-CM | POA: Diagnosis not present

## 2020-05-27 DIAGNOSIS — I63512 Cerebral infarction due to unspecified occlusion or stenosis of left middle cerebral artery: Secondary | ICD-10-CM

## 2020-05-27 DIAGNOSIS — D649 Anemia, unspecified: Secondary | ICD-10-CM

## 2020-05-27 DIAGNOSIS — L89153 Pressure ulcer of sacral region, stage 3: Secondary | ICD-10-CM | POA: Diagnosis not present

## 2020-05-27 DIAGNOSIS — I4891 Unspecified atrial fibrillation: Secondary | ICD-10-CM

## 2020-05-27 DIAGNOSIS — R609 Edema, unspecified: Secondary | ICD-10-CM

## 2020-05-27 DIAGNOSIS — E785 Hyperlipidemia, unspecified: Secondary | ICD-10-CM

## 2020-05-27 DIAGNOSIS — I10 Essential (primary) hypertension: Secondary | ICD-10-CM

## 2020-05-27 DIAGNOSIS — L97929 Non-pressure chronic ulcer of unspecified part of left lower leg with unspecified severity: Secondary | ICD-10-CM

## 2020-05-27 DIAGNOSIS — I83892 Varicose veins of left lower extremities with other complications: Secondary | ICD-10-CM

## 2020-05-27 NOTE — Progress Notes (Signed)
Provider:  [facility] Location:    [facility] Room Number: [phone] Place of Service:  SNF (31)  PCP: [name], [name], NP Patient Care Team: [name], [name], NP as PCP - General (Internal Medicine) Melina Modena, [facility] Netta Cedars, MD as Consulting Physician (Orthopedic Surgery) Luberta Mutter, MD as Consulting Physician (Ophthalmology) Sydnee Levans, MD as Consulting Physician (Dermatology) Adrian Prows, MD as Consulting Physician (Cardiology)  Extended Emergency Contact Information Primary Emergency Contact: [facility] Address: [address]          [address] [place] [address] [name] of [place] Phone: [phone] Mobile Phone: [phone] Relation: Daughter Secondary Emergency Contact: Chirstina, Haan Mobile Phone: [phone] Relation: Son  Code Status: DNR Goals of Care: Advanced Directive information Advanced Directives 05/27/2020  Does Patient Have a Medical Advance Directive? Yes  Type of Advance Directive Out of facility DNR (pink MOST or yellow form);Living will;Healthcare Power of Attorney  Does patient want to make changes to medical advance directive? No - Patient declined  Copy of [place] in Chart? Yes - validated most recent copy scanned in chart (See row information)  Would patient like information on creating a medical advance directive? -  Pre-existing out of facility DNR order (yellow form or pink MOST form) Yellow form placed in chart (order not valid for inpatient use)      Chief Complaint  Patient presents with  . Readmit To SNF    HPI: Patient is a 86 y.o. female seen today for Readmission to SNF for Long term Care   Was admitted in the Hospital from 10/04-10/06 for Acute Left MCA stroke  Patient has h/o Hypertension, Hyperlipidemia, Osteoarthritis, Unstable Gait Wheelchair Dependent,h/o Vaginal Atropy, Macular Degeneration And h/o A Fib and Chronic Sacral Wound and LE  wounds  Patient with h/o A Fib not on any anticoagulation due to Frailty and Low Afib Burden was send to ED from SNF as she was noticed to have Slurred speech.and Right Facial Drop MRI showed Left MCA stroke involving the posterior left frontoparietal region . She was started back on Eliquis. Echo was negative for any source of Emboli.  Patient is back in SNF. Speech is better. Working with speech Therapy. Per them she is more confused then before. Daughter did not want her to get aggressive therapy. No Other acute issues. Mood is stable. Weight is stable Continues to follow with Wound care for her wounds     Past Medical History:  Diagnosis Date  . Abnormal uterine bleeding   . Actinic keratosis 02/13/2012  . Acute bronchospasm 10/31/2011  . Cancer ([place]) 04/2008   Skin cancer of low back [name], MD  . Cataract   . Disorder of bone and cartilage, unspecified 02/18/2007  . Dizziness and giddiness 08/30/2010  . Dysmenorrhea   . Edema 12/19/2011  . Endometriosis   . 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 Osteoarthrosis, unspecified whether generalized or localized, unspecified site 08/20/2006  . Osteoporosis   . Other abnormal blood chemistry 03/14/2011  . Other and unspecified hyperlipidemia 10/18/2010  . Palpitations 02/13/2012  . Postmenopausal bleeding 08/30/2010  . Shortness of breath 11/28/2011  . Supraventricular premature beats 05/23/2011  . Unspecified essential hypertension 08/20/2006   Past Surgical History:  Procedure Laterality Date  . CARPAL TUNNEL RELEASE  2004   S. Norris MD  . CATARACT EXTRACTION W/ INTRAOCULAR LENS IMPLANT Right 2010  . CATARACT EXTRACTION W/  INTRAOCULAR LENS IMPLANT Left 10/2012  . DILATION AND CURETTAGE OF UTERUS    . EYE SURGERY Right 07/2009   cataract extraction/IOLI [name], MD  . KNEE ARTHROSCOPY Right 2008   Torn meniscus, Dr. Veverly Fells    reports that she quit smoking about 42 years ago. She has never used smokeless tobacco.  She reports current alcohol use. She reports that she does not use drugs. Social History   Socioeconomic History  . Marital status: Widowed    Spouse name: Not on file  . Number of children: Not on file  . Years of education: Not on file  . Highest education level: Not on file  Occupational History  . Not on file  Tobacco Use  . Smoking status: Former Smoker    Quit date: 02/11/1978    Years since quitting: 42.3  . Smokeless tobacco: Never Used  Vaping Use  . Vaping Use: Never used  Substance and Sexual Activity  . Alcohol use: Yes  . Drug use: No  . Sexual activity: Not Currently  Other Topics Concern  . Not on file  Social History Narrative   L[facility] alone in a apartment at [facility] in the independent living area since 2007   The patient is not exercising regularly, walking with walker   No specific diet.   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   She has a living will, POA   Former smoker, stopped 1979   Alcohol - one glass of wine at night   Social Determinants of Health   Financial Resource Strain:   . Difficulty of Paying Living Expenses: Not on file  Food Insecurity:   . Worried About Charity fundraiser in the Last Year: Not on file  . Ran Out of Food in the Last Year: Not on file  Transportation Needs:   . Lack of Transportation (Medical): Not on file  . Lack of Transportation (Non-Medical): Not on file  Physical Activity:   . Days of Exercise per Week: Not on file  . Minutes of Exercise per Session: Not on file  Stress:   . Feeling of Stress : Not on file  Social Connections:   . Frequency of Communication with Friends and Family: Not on file  . Frequency of Social Gatherings with Friends and Family: Not on file  . Attends Religious Services: Not on file  . Active Member of Clubs or Organizations: Not on file  . Attends Archivist Meetings: Not on file  . Marital Status: Not on file  Intimate Partner Violence:   . Fear of Current  or Ex-Partner: Not on file  . Emotionally Abused: Not on file  . Physically Abused: Not on file  . Sexually Abused: Not on file    Functional Status Survey:    Family History  Problem Relation Age of Onset  . Diabetes Brother   . COPD Brother     Health Maintenance  Topic Date Due  . COVID-19 Vaccine (1) Never done  . DEXA SCAN  Never done  . PNA vac Low Risk Adult (2 of 2 - PPSV23) 08/21/2000  . TETANUS/TDAP  08/21/2009  . INFLUENZA VACCINE  Completed    Allergies  Allergen Reactions  . Lomotil [Diphenoxylate] Nausea And Vomiting  . Sulfa Antibiotics Other (See Comments)    Pt unknown   . Amlodipine Swelling    Allergies as of 05/27/2020      Reactions   Lomotil [diphenoxylate] Nausea And Vomiting  Sulfa Antibiotics Other (See Comments)   Pt unknown    Amlodipine Swelling      Medication List       Accurate as of May 27, 2020  9:14 AM. If you have any questions, ask your nurse or doctor.        acetaminophen 325 MG tablet Commonly known as: TYLENOL Take 2 tablets (650 mg total) by mouth every 4 (four) hours as needed for mild pain (or temp > 37.5 C (99.5 F)).   apixaban 2.5 MG Tabs tablet Commonly known as: ELIQUIS Take 1 tablet (2.5 mg total) by mouth 2 (two) times daily.   ARGINAID PO Take 237 mLs by mouth 2 (two) times daily.   ARGININE PO Take by mouth. Arginaid Extra (whey protein-arginine-c-e-zinc) 6-4.5-250 gram-gram-mg liquid. Twice A Day Between Meals 274mL, oral, Twice A Day Between Meals   Artificial Tears 0.1-0.3 % Soln Generic drug: Dextran 70-Hypromellose Apply 1 drop to eye in the morning, at noon, in the evening, and at bedtime. Both eyes   atorvastatin 20 MG tablet Commonly known as: LIPITOR Take 1 tablet (20 mg total) by mouth daily.   diltiazem 120 MG 24 hr capsule Commonly known as: CARDIZEM CD Take 1 capsule (120 mg total) by mouth daily.   feeding supplement (PRO-STAT SUGAR FREE 64) Liqd Take 30 mLs by mouth 2 (two)  times daily with a meal.   ferrous sulfate 325 (65 FE) MG tablet Take 325 mg by mouth. Once a day on Sunday, Tuesday, Thursday and Saturday   hydrocerin Crea Apply 1 application topically every other day.   loperamide 2 MG capsule Commonly known as: IMODIUM Take 1 capsule (2 mg total) by mouth every 6 (six) hours as needed for diarrhea or loose stools.   magnesium oxide 400 MG tablet Commonly known as: MAG-OX Take 400 mg by mouth daily.   metoprolol tartrate 50 MG tablet Commonly known as: LOPRESSOR Take 75 mg by mouth 2 (two) times daily.   mirtazapine 7.5 MG tablet Commonly known as: REMERON Take 7.5 mg by mouth every other day.   multivitamin-lutein Caps capsule Take 1 capsule by mouth in the morning and at bedtime.   potassium chloride 10 MEQ tablet Commonly known as: KLOR-CON Take 30 mEq by mouth daily.   saccharomyces boulardii 250 MG capsule Commonly known as: FLORASTOR Take 250 mg by mouth 2 (two) times daily.   torsemide 20 MG tablet Commonly known as: DEMADEX Take 20 mg by mouth every other day.   triamcinolone cream 0.1 % Commonly known as: KENALOG Apply 1 application topically every other day. Once a day. Apply to legs with esch dressing change.   Tubersol 5 UNIT/0.1ML injection Generic drug: tuberculin Inject into the skin once.   vitamin B-12 1000 MCG tablet Commonly known as: CYANOCOBALAMIN Take 1,000 mcg by mouth daily.   zinc oxide 20 % ointment Apply 1 application topically as needed for irritation.       Review of Systems  Constitutional: Positive for activity change.  HENT: Negative.   Respiratory: Negative.   Cardiovascular: Positive for leg swelling.  Gastrointestinal: Negative.   Genitourinary: Negative.   Musculoskeletal: Positive for gait problem.  Skin: Positive for wound.  Neurological: Positive for weakness.  Psychiatric/Behavioral: Positive for confusion.  All other systems reviewed and are negative.   Vitals:    05/27/20 0903  BP: 110/68  Pulse: 63  Resp: 18  Temp: 97.7 F (36.5 C)  SpO2: 91%  Weight: 106 lb 2.2 oz (48.1  kg)  Height: 5\' 2"  (1.575 m)   Body mass index is 19.41 kg/m. Physical Exam Vitals reviewed.  Constitutional:      Appearance: Normal appearance.     Comments: Very Frail  HENT:     Head: Normocephalic.     Nose: Nose normal.     Mouth/Throat:     Mouth: Mucous membranes are moist.     Pharynx: Oropharynx is clear.  Eyes:     Pupils: Pupils are equal, round, and reactive to light.  Cardiovascular:     Rate and Rhythm: Normal rate and regular rhythm.  Pulmonary:     Effort: Pulmonary effort is normal.     Breath sounds: Normal breath sounds.  Abdominal:     General: Abdomen is flat. Bowel sounds are normal.     Palpations: Abdomen is soft.  Musculoskeletal:        General: Swelling present.     Cervical back: Neck supple.  Neurological:     Mental Status: She is alert.     Comments: Speech was mildily Slurred with some right facial droop No other Focal deficits  Psychiatric:        Mood and Affect: Mood normal.        Thought Content: Thought content normal.        Judgment: Judgment normal.     Labs reviewed: Basic Metabolic Panel: Recent Labs    01/26/20 0446 01/26/20 0446 01/29/20 0000 03/08/20 0000 03/08/20 0000 04/01/20 0000 05/24/20 1741 05/24/20 1756 05/26/20 0322 05/26/20 0828  NA 130*   < >   < > 137   < > 140 143 142 142  --   K 3.6   < >   < > 4.0   < > 4.3 3.6 3.5 3.2*  --   CL 101   < >   < > 97*   < > 99 102 102 102  --   CO2 22  --    < > 37*   < > 34* 29  --  31  --   GLUCOSE 109*   < >  --   --   --   --  117* 113* 102*  --   BUN 15   < >   < > 24*   < > 25* 26* 29* 20  --   CREATININE 0.66   < >   < > 0.6   < > 0.8 0.68 0.60 0.60  --   CALCIUM 7.7*  --    < > 8.5*   < > 8.5* 8.8*  --  8.9  --   MG 1.8  --   --  1.6  --   --   --   --   --  2.0   < > = values in this interval not displayed.   Liver Function  Tests: Recent Labs    01/15/20 0840 01/15/20 0840 01/18/20 1302 02/12/20 0000 03/08/20 0000 04/01/20 0000 05/24/20 1741  AST 12   < > 14*   < > 12* 12* 20  ALT 7   < > 10   < > 7 12 17   ALKPHOS  --   --  47   < > 47 54 63  BILITOT 0.7  --  1.0  --   --   --  0.7  PROT 5.9*  --  5.8*  --   --   --  6.3*  ALBUMIN  --   --  3.0*   < > 2.7* 2.7* 2.9*   < > = values in this interval not displayed.   No results for input(s): LIPASE, AMYLASE in the last 8760 hours. No results for input(s): AMMONIA in the last 8760 hours. CBC: Recent Labs    01/26/20 0446 01/29/20 0000 04/01/20 0000 04/01/20 0000 05/24/20 1741 05/24/20 1756 05/26/20 0823  WBC 10.5   < > 9.3  --  11.7*  --  10.5  NEUTROABS 8.6*   < > 7,719  --  9.5*  --  8.6*  HGB 10.0*   < > 8.4*   < > 10.3* 11.2* 10.6*  HCT 31.6*   < > 27*   < > 35.1* 33.0* 35.1*  MCV 86.8  --   --   --  88.4  --  88.4  PLT 255   < > 296  --  265  --  279   < > = values in this interval not displayed.   Cardiac Enzymes: No results for input(s): CKTOTAL, CKMB, CKMBINDEX, TROPONINI in the last 8760 hours. BNP: Invalid input(s): POCBNP Lab Results  Component Value Date   HGBA1C 5.4 05/25/2020   Lab Results  Component Value Date   TSH 2.095 05/25/2020   Lab Results  Component Value Date   VITAMINB12 311 03/08/2020   Lab Results  Component Value Date   FOLATE 9.9 03/08/2020   Lab Results  Component Value Date   IRON 25 03/08/2020   TIBC 200 (L) 01/21/2020   FERRITIN 102 03/08/2020    Imaging and Procedures obtained prior to SNF admission: CT ANGIO HEAD W OR WO CONTRAST  Result Date: 05/24/2020 CLINICAL DATA:  Left facial droop and slurred speech. Negative acute head CT. EXAM: CT ANGIOGRAPHY HEAD AND NECK TECHNIQUE: Multidetector CT imaging of the head and neck was performed using the standard protocol during bolus administration of intravenous contrast. Multiplanar CT image reconstructions and MIPs were obtained to evaluate the  vascular anatomy. Carotid stenosis measurements (when applicable) are obtained utilizing NASCET criteria, using the distal internal carotid diameter as the denominator. CONTRAST:  23mL OMNIPAQUE IOHEXOL 350 MG/ML SOLN COMPARISON:  Head CT same day FINDINGS: CTA NECK FINDINGS Aortic arch: Aortic atherosclerotic calcification. Normal branching pattern without origin stenosis. Right carotid system: Common carotid artery widely patent to the bifurcation region. Calcified plaque at the carotid bifurcation and ICA bulb but no stenosis. Cervical ICA is tortuous but widely patent. Left carotid system: Common carotid artery widely patent to the bifurcation region. Calcified plaque at the carotid bifurcation and ICA bulb. Minimal diameter of the distal ICA bulb measures 3.2 mm. Compared to a more distal cervical ICA diameter of 5 mm, this indicates a 35% stenosis. Vertebral arteries: Both vertebral artery origins are patent. Some calcified plaque in the proximal right vertebral artery but without stenosis greater than 30%. Beyond that, both vertebral arteries widely patent through the cervical region to the foramen magnum. Skeleton: Chronic cervical spondylosis.  No acute finding. Other neck: No soft tissue mass or lymphadenopathy. Upper chest: Negative Review of the MIP images confirms the above findings CTA HEAD FINDINGS Anterior circulation: Both internal carotid arteries widely patent through the skull base and siphon regions. Ordinary siphon atherosclerotic calcification without stenosis. The anterior and middle cerebral vessels are patent. No large or medium vessel occlusion. No correctable proximal stenosis. Mild atherosclerotic irregularity of the more distal branch vessels. Posterior circulation: Both vertebral arteries widely patent to the basilar. No basilar stenosis. Posterior circulation branch vessels are  patent. There is atherosclerotic narrowing and irregularity of the more distal PCA branches. Venous sinuses:  Patent and normal. Anatomic variants: None significant. Review of the MIP images confirms the above findings IMPRESSION: 1. No large or medium vessel occlusion. 2. Aortic atherosclerosis. 3. Atherosclerotic disease at both carotid bifurcations. 35% stenosis of the distal ICA bulb on the left. No measurable stenosis on the right. 4. Intracranial atherosclerotic narrowing and irregularity of the more distal branch vessels. 5. These results were communicated to Dr. Erlinda Hong At 5:40 pmon 10/4/2021by text page via the [facility] messaging system. Aortic Atherosclerosis (ICD10-I70.0). Electronically Signed   By: Nelson Chimes M.D.   On: 05/24/2020 17:40   CT ANGIO NECK W OR WO CONTRAST  Result Date: 05/24/2020 CLINICAL DATA:  Left facial droop and slurred speech. Negative acute head CT. EXAM: CT ANGIOGRAPHY HEAD AND NECK TECHNIQUE: Multidetector CT imaging of the head and neck was performed using the standard protocol during bolus administration of intravenous contrast. Multiplanar CT image reconstructions and MIPs were obtained to evaluate the vascular anatomy. Carotid stenosis measurements (when applicable) are obtained utilizing NASCET criteria, using the distal internal carotid diameter as the denominator. CONTRAST:  66mL OMNIPAQUE IOHEXOL 350 MG/ML SOLN COMPARISON:  Head CT same day FINDINGS: CTA NECK FINDINGS Aortic arch: Aortic atherosclerotic calcification. Normal branching pattern without origin stenosis. Right carotid system: Common carotid artery widely patent to the bifurcation region. Calcified plaque at the carotid bifurcation and ICA bulb but no stenosis. Cervical ICA is tortuous but widely patent. Left carotid system: Common carotid artery widely patent to the bifurcation region. Calcified plaque at the carotid bifurcation and ICA bulb. Minimal diameter of the distal ICA bulb measures 3.2 mm. Compared to a more distal cervical ICA diameter of 5 mm, this indicates a 35% stenosis. Vertebral arteries: Both vertebral  artery origins are patent. Some calcified plaque in the proximal right vertebral artery but without stenosis greater than 30%. Beyond that, both vertebral arteries widely patent through the cervical region to the foramen magnum. Skeleton: Chronic cervical spondylosis.  No acute finding. Other neck: No soft tissue mass or lymphadenopathy. Upper chest: Negative Review of the MIP images confirms the above findings CTA HEAD FINDINGS Anterior circulation: Both internal carotid arteries widely patent through the skull base and siphon regions. Ordinary siphon atherosclerotic calcification without stenosis. The anterior and middle cerebral vessels are patent. No large or medium vessel occlusion. No correctable proximal stenosis. Mild atherosclerotic irregularity of the more distal branch vessels. Posterior circulation: Both vertebral arteries widely patent to the basilar. No basilar stenosis. Posterior circulation branch vessels are patent. There is atherosclerotic narrowing and irregularity of the more distal PCA branches. Venous sinuses: Patent and normal. Anatomic variants: None significant. Review of the MIP images confirms the above findings IMPRESSION: 1. No large or medium vessel occlusion. 2. Aortic atherosclerosis. 3. Atherosclerotic disease at both carotid bifurcations. 35% stenosis of the distal ICA bulb on the left. No measurable stenosis on the right. 4. Intracranial atherosclerotic narrowing and irregularity of the more distal branch vessels. 5. These results were communicated to Dr. Erlinda Hong At 5:40 pmon 10/4/2021by text page via the [facility] messaging system. Aortic Atherosclerosis (ICD10-I70.0). Electronically Signed   By: Nelson Chimes M.D.   On: 05/24/2020 17:40   MR BRAIN WO CONTRAST  Result Date: 05/25/2020 CLINICAL DATA:  Follow-up examination for acute stroke. EXAM: MRI HEAD WITHOUT CONTRAST TECHNIQUE: Multiplanar, multiecho pulse sequences of the brain and surrounding structures were obtained without  intravenous contrast. COMPARISON:  Prior CTs from  05/24/2020. FINDINGS: Brain: Diffuse prominence of the CSF containing spaces compatible with generalized cerebral atrophy. Patchy and confluent T2/FLAIR hyperintensity within the periventricular deep white matter both cerebral hemispheres most compatible chronic microvascular ischemic disease, moderate in nature. Small remote lacunar infarct noted at the left thalamus. Additional small remote left cerebellar infarct noted as well. Patchy restricted diffusion seen involving the cortical and subcortical aspect of the posterior left frontoparietal region, consistent with acute left MCA territory infarct. No associated hemorrhage or mass effect. No other evidence for acute or subacute ischemia. Gray-white matter differentiation otherwise maintained. Few additional punctate chronic micro hemorrhages noted at the left temporal occipital region and left cerebellum. No mass lesion, midline shift or mass effect. No hydrocephalus or extra-axial fluid collection. Pituitary gland and suprasellar region within normal limits. Midline structures intact. Multiple prominent arachnoid pits noted at the occipital calvarium. Vascular: Major intracranial vascular flow voids are maintained. Skull and upper cervical spine: Craniocervical junction within normal limits. Bone marrow signal intensity normal. No scalp soft tissue abnormality. Sinuses/Orbits: Patient status post bilateral ocular lens replacement. Paranasal sinuses are clear. No mastoid effusion. Other: None. IMPRESSION: 1. Patchy small volume acute ischemic left MCA territory infarct involving the posterior left frontoparietal region. No associated hemorrhage or mass effect. 2. Underlying age-related cerebral atrophy with moderate chronic small vessel ischemic disease, with small remote infarcts involving the left thalamus and left cerebellum. Electronically Signed   By: Jeannine Boga M.D.   On: 05/25/2020 01:34   DG  Chest Portable 1 View  Result Date: 05/24/2020 CLINICAL DATA:  Weakness. EXAM: PORTABLE CHEST 1 VIEW COMPARISON:  Jan 19, 2020 FINDINGS: There is cardiomegaly. There is a moderate-sized left-sided pleural effusion. There is a small right-sided pleural effusion. There is no pneumothorax. There is atelectasis at the lung bases. There are atherosclerotic changes of the thoracic aorta. There is no acute osseous abnormality. IMPRESSION: 1. Cardiomegaly with bilateral pleural effusions, moderate on the left and small on the right. 2. Bibasilar atelectasis. Electronically Signed   By: Constance Holster M.D.   On: 05/24/2020 19:09   ECHOCARDIOGRAM COMPLETE  Result Date: 05/25/2020    ECHOCARDIOGRAM REPORT   Patient Name:   YONG WAHLQUIST Date of Exam: 05/25/2020 Medical Rec #:  [id number]     Height:       62.0 in Accession #:    [id number]    Weight:       106.9 lb Date of Birth:  03/24/1923     BSA:          1.465 m Patient Age:    86 years      BP:           118/73 mmHg Patient Gender: F             HR:           113 bpm. Exam Location:  Inpatient Procedure: 2D Echo, Cardiac Doppler and Color Doppler Indications:    TIA 435.9 / G45.9  History:        Patient has prior history of Echocardiogram examinations, most                 recent 01/22/2020. Signs/Symptoms:Shortness of Breath; Risk                 Factors:Hypertension and Dyslipidemia.  Sonographer:    Bernadene Person RDCS Referring Phys: [place]  1. Left ventricular ejection fraction, by estimation, is 60 to 65%. The left ventricle has  normal function. The left ventricle has no regional wall motion abnormalities. Left ventricular diastolic parameters are consistent with Grade II diastolic dysfunction (pseudonormalization). Elevated left ventricular end-diastolic pressure.  2. Right ventricular systolic function is normal. The right ventricular size is normal. There is mildly elevated pulmonary artery systolic pressure.  3. Left atrial  size was severely dilated.  4. Right atrial size was mildly dilated.  5. Moderate pleural effusion in the left lateral region.  6. The mitral valve is normal in structure. Mild mitral valve regurgitation. No evidence of mitral stenosis.  7. The aortic valve is normal in structure. Aortic valve regurgitation is trivial. No aortic stenosis is present.  8. The inferior vena cava is normal in size with greater than 50% respiratory variability, suggesting right atrial pressure of 3 mmHg. FINDINGS  Left Ventricle: Left ventricular ejection fraction, by estimation, is 60 to 65%. The left ventricle has normal function. The left ventricle has no regional wall motion abnormalities. The left ventricular internal cavity size was normal in size. There is  no left ventricular hypertrophy. Left ventricular diastolic parameters are consistent with Grade II diastolic dysfunction (pseudonormalization). Elevated left ventricular end-diastolic pressure. Right Ventricle: The right ventricular size is normal. No increase in right ventricular wall thickness. Right ventricular systolic function is normal. There is mildly elevated pulmonary artery systolic pressure. The tricuspid regurgitant velocity is 3.19  m/s, and with an assumed right atrial pressure of 3 mmHg, the estimated right ventricular systolic pressure is 01.0 mmHg. Left Atrium: Left atrial size was severely dilated. Right Atrium: Right atrial size was mildly dilated. Pericardium: There is no evidence of pericardial effusion. Mitral Valve: The mitral valve is normal in structure. Mild mitral annular calcification. Mild mitral valve regurgitation. No evidence of mitral valve stenosis. Tricuspid Valve: The tricuspid valve is normal in structure. Tricuspid valve regurgitation is mild . No evidence of tricuspid stenosis. Aortic Valve: The aortic valve is normal in structure. Aortic valve regurgitation is trivial. Aortic regurgitation PHT measures 288 msec. No aortic stenosis is  present. Pulmonic Valve: The pulmonic valve was normal in structure. Pulmonic valve regurgitation is not visualized. No evidence of pulmonic stenosis. Aorta: The aortic root is normal in size and structure. Venous: The inferior vena cava is normal in size with greater than 50% respiratory variability, suggesting right atrial pressure of 3 mmHg. IAS/Shunts: No atrial level shunt detected by color flow Doppler. Additional Comments: There is a moderate pleural effusion in the left lateral region.  LEFT VENTRICLE PLAX 2D LVIDd:         3.30 cm     Diastology LVIDs:         2.62 cm     LV e' medial:    6.05 cm/s LV PW:         0.97 cm     LV E/e' medial:  19.7 LV IVS:        0.90 cm     LV e' lateral:   7.36 cm/s LVOT diam:     1.80 cm     LV E/e' lateral: 16.2 LV SV:         40 LV SV Index:   27 LVOT Area:     2.54 cm  LV Volumes (MOD) LV vol d, MOD A2C: 28.7 ml LV vol d, MOD A4C: 32.7 ml LV vol s, MOD A2C: 12.7 ml LV vol s, MOD A4C: 15.8 ml LV SV MOD A2C:     16.0 ml LV SV MOD A4C:  32.7 ml LV SV MOD BP:      16.5 ml RIGHT VENTRICLE RV S prime:     6.84 cm/s TAPSE (M-mode): 1.4 cm LEFT ATRIUM             Index       RIGHT ATRIUM           Index LA diam:        2.50 cm 1.71 cm/m  RA Area:     19.90 cm LA Vol (A2C):   69.5 ml 47.44 ml/m RA Volume:   46.60 ml  31.81 ml/m LA Vol (A4C):   47.8 ml 32.63 ml/m LA Biplane Vol: 59.6 ml 40.68 ml/m  AORTIC VALVE LVOT Vmax:   90.10 cm/s LVOT Vmean:  56.300 cm/s LVOT VTI:    0.157 m AI PHT:      288 msec  AORTA Ao Root diam: 3.40 cm Ao Asc diam:  3.40 cm MITRAL VALVE                 TRICUSPID VALVE MV Area (PHT): 9.60 cm      TR Peak grad:   40.7 mmHg MV Decel Time: 79 msec       TR Vmax:        319.00 cm/s MR Peak grad:    124.1 mmHg MR Mean grad:    79.0 mmHg   SHUNTS MR Vmax:         557.00 cm/s Systemic VTI:  0.16 m MR Vmean:        423.0 cm/s  Systemic Diam: 1.80 cm MR PISA:         0.39 cm MR PISA Eff ROA: 3 mm MR PISA Radius:  0.25 cm MV E velocity: 119.00 cm/s  MV A velocity: 79.90 cm/s MV E/A ratio:  1.49 Skeet Latch MD Electronically signed by Skeet Latch MD Signature Date/Time: 05/25/2020/12:14:19 PM    Final    CT HEAD CODE STROKE WO CONTRAST  Result Date: 05/24/2020 CLINICAL DATA:  Code stroke.  Acute neurological deficit. EXAM: CT HEAD WITHOUT CONTRAST TECHNIQUE: Contiguous axial images were obtained from the base of the skull through the vertex without intravenous contrast. COMPARISON:  None. FINDINGS: Brain: Age related atrophy. Chronic small-vessel ischemic changes throughout the cerebral hemispheric white matter. No sign of acute infarction, mass lesion, hemorrhage, hydrocephalus or extra-axial collection. Vascular: There is atherosclerotic calcification of the major vessels at the base of the brain. Skull: Negative Sinuses/Orbits: Clear/normal Other: None ASPECTS ([place] Stroke Program Early CT Score) - Ganglionic level infarction (caudate, lentiform nuclei, internal capsule, insula, M1-M3 cortex): 7 - Supraganglionic infarction (M4-M6 cortex): 3 Total score (0-10 with 10 being normal): 10 IMPRESSION: 1. No acute finding by CT. Atrophy and chronic small-vessel ischemic changes. 2. ASPECTS is 10. 3. These results were communicated to Dr. Erlinda Hong At 5:23 pmon 10/4/2021by text page via the [facility] messaging system. Electronically Signed   By: Nelson Chimes M.D.   On: 05/24/2020 17:24    Assessment/Plan Acute ischemic left MCA stroke ([place]) Restarted on Eliquis Already on Lipitor Working with Therapy Atrial fibrillation with rapid ventricular response ([place]) Restarted on Eliquis Also on Toprol and Cardizem Hyperlipidemia,  Continue Lipiutor Last LDL was 79 Venous stasis ulcer of left lower leg  Getting Follow up with Wound care Continues to be on Low dose of Demadex  Anemia, unspecified type Low but stable Continue on Iron and B12 Repeat CBC in 1 Week Pressure injury of sacral region, stage 3 (HCC) Negative Xray  for [employer] with  Wound care  Essential hypertension Stable Depression with Weight loss Has been doing well with Remeron   Family/ staff Communication:   Labs/tests ordered: CBC, CMP

## 2020-05-31 ENCOUNTER — Encounter (HOSPITAL_BASED_OUTPATIENT_CLINIC_OR_DEPARTMENT_OTHER): Payer: Medicare Other | Admitting: Internal Medicine

## 2020-05-31 ENCOUNTER — Ambulatory Visit: Payer: Medicare Other | Admitting: Cardiology

## 2020-06-01 ENCOUNTER — Encounter (HOSPITAL_BASED_OUTPATIENT_CLINIC_OR_DEPARTMENT_OTHER): Payer: Medicare Other | Attending: Internal Medicine | Admitting: Internal Medicine

## 2020-06-01 DIAGNOSIS — I4811 Longstanding persistent atrial fibrillation: Secondary | ICD-10-CM | POA: Diagnosis not present

## 2020-06-01 DIAGNOSIS — I714 Abdominal aortic aneurysm, without rupture: Secondary | ICD-10-CM | POA: Diagnosis not present

## 2020-06-01 DIAGNOSIS — Z79899 Other long term (current) drug therapy: Secondary | ICD-10-CM | POA: Insufficient documentation

## 2020-06-01 DIAGNOSIS — L89154 Pressure ulcer of sacral region, stage 4: Secondary | ICD-10-CM | POA: Insufficient documentation

## 2020-06-01 DIAGNOSIS — D649 Anemia, unspecified: Secondary | ICD-10-CM | POA: Diagnosis not present

## 2020-06-01 DIAGNOSIS — Z8673 Personal history of transient ischemic attack (TIA), and cerebral infarction without residual deficits: Secondary | ICD-10-CM | POA: Diagnosis not present

## 2020-06-01 DIAGNOSIS — L97221 Non-pressure chronic ulcer of left calf limited to breakdown of skin: Secondary | ICD-10-CM | POA: Diagnosis not present

## 2020-06-01 DIAGNOSIS — K7460 Unspecified cirrhosis of liver: Secondary | ICD-10-CM | POA: Diagnosis not present

## 2020-06-01 DIAGNOSIS — I87312 Chronic venous hypertension (idiopathic) with ulcer of left lower extremity: Secondary | ICD-10-CM | POA: Insufficient documentation

## 2020-06-01 NOTE — Progress Notes (Signed)
MERCEDEZ, BOULE ([id number]) Visit Report for 06/01/2020 Arrival Information Details Patient Name: Date of Service: [name], [name] IS A. 06/01/2020 9:15 A M Medical Record Number: [id number] Patient Account Number: [id number] Date of Birth/Sex: Treating RN: 11/21/1922 (86 y.o. Elam Dutch Primary Care Gwynneth Fabio: [name], Man Other Clinician: Referring Romelo Sciandra: Treating Celisse Ciulla/Extender: Lesly Rubenstein, Man Weeks in Treatment: 9 Visit Information History Since Last Visit Added or deleted any medications: Yes Patient Arrived: Wheel Chair Any new allergies or adverse reactions: No Arrival Time: 09:24 Had a fall or experienced change in No Accompanied By: self activities of daily living that may affect Transfer Assistance: EasyPivot Patient Lift risk of falls: Patient Identification Verified: Yes Signs or symptoms of abuse/neglect since last visito No Secondary Verification Process Completed: Yes Hospitalized since last visit: Yes Patient Requires Transmission-Based Precautions: No Has Dressing in Place as Prescribed: Yes Patient Has Alerts: No Has Compression in Place as Prescribed: Yes Pain Present Now: Yes Electronic Signature(s) Signed: 06/01/2020 4:37:34 PM By: Baruch Gouty RN, BSN Entered By: Baruch Gouty on 06/01/2020 09:28:32 -------------------------------------------------------------------------------- Clinic Level of Care Assessment Details Patient Name: Date of Service: [name], [name] 06/01/2020 9:15 A M Medical Record Number: [id number] Patient Account Number: [id number] Date of Birth/Sex: Treating RN: 03-06-1923 (86 y.o. Orvan Falconer Primary Care Maresha Anastos: [name], Man Other Clinician: Referring Khyren Hing: Treating Lucine Bilski/Extender: Lesly Rubenstein, Man Weeks in Treatment: 9 Clinic Level of Care Assessment Items TOOL 4 Quantity Score X- 1 0 Use when only an EandM is performed on FOLLOW-UP visit ASSESSMENTS - Nursing Assessment /  Reassessment X- 1 10 Reassessment of Co-morbidities (includes updates in patient status) X- 1 5 Reassessment of Adherence to Treatment Plan ASSESSMENTS - Wound and Skin A ssessment / Reassessment '[]'  - 0 Simple Wound Assessment / Reassessment - one wound X- 3 5 Complex Wound Assessment / Reassessment - multiple wounds '[]'  - 0 Dermatologic / Skin Assessment (not related to wound area) ASSESSMENTS - Focused Assessment X- 1 5 Circumferential Edema Measurements - multi extremities '[]'  - 0 Nutritional Assessment / Counseling / Intervention '[]'  - 0 Lower Extremity Assessment (monofilament, tuning fork, pulses) '[]'  - 0 Peripheral Arterial Disease Assessment (using hand held doppler) ASSESSMENTS - Ostomy and/or Continence Assessment and Care '[]'  - 0 Incontinence Assessment and Management '[]'  - 0 Ostomy Care Assessment and Management (repouching, etc.) PROCESS - Coordination of Care X - Simple Patient / Family Education for ongoing care 1 15 '[]'  - 0 Complex (extensive) Patient / Family Education for ongoing care X- 1 10 Staff obtains Programmer, systems, Records, T Results / Process Orders est '[]'  - 0 Staff telephones HHA, Nursing Homes / Clarify orders / etc '[]'  - 0 Routine Transfer to another Facility (non-emergent condition) '[]'  - 0 Routine Hospital Admission (non-emergent condition) '[]'  - 0 New Admissions / Biomedical engineer / Ordering NPWT Apligraf, etc. , '[]'  - 0 Emergency Hospital Admission (emergent condition) X- 1 10 Simple Discharge Coordination '[]'  - 0 Complex (extensive) Discharge Coordination PROCESS - Special Needs '[]'  - 0 Pediatric / Minor Patient Management '[]'  - 0 Isolation Patient Management '[]'  - 0 Hearing / Language / Visual special needs '[]'  - 0 Assessment of Community assistance (transportation, D/C planning, etc.) '[]'  - 0 Additional assistance / Altered mentation '[]'  - 0 Support Surface(s) Assessment (bed, cushion, seat, etc.) INTERVENTIONS - Wound Cleansing /  Measurement '[]'  - 0 Simple Wound Cleansing - one wound X- 3 5 Complex Wound Cleansing - multiple wounds X- 1 5 Wound Imaging (  photographs - any number of wounds) '[]'  - 0 Wound Tracing (instead of photographs) '[]'  - 0 Simple Wound Measurement - one wound X- 3 5 Complex Wound Measurement - multiple wounds INTERVENTIONS - Wound Dressings X - Small Wound Dressing one or multiple wounds 1 10 '[]'  - 0 Medium Wound Dressing one or multiple wounds X- 2 20 Large Wound Dressing one or multiple wounds '[]'  - 0 Application of Medications - topical '[]'  - 0 Application of Medications - injection INTERVENTIONS - Miscellaneous '[]'  - 0 External ear exam '[]'  - 0 Specimen Collection (cultures, biopsies, blood, body fluids, etc.) '[]'  - 0 Specimen(s) / Culture(s) sent or taken to Lab for analysis '[]'  - 0 Patient Transfer (multiple staff / Civil Service fast streamer / Similar devices) '[]'  - 0 Simple Staple / Suture removal (25 or less) '[]'  - 0 Complex Staple / Suture removal (26 or more) '[]'  - 0 Hypo / Hyperglycemic Management (close monitor of Blood Glucose) '[]'  - 0 Ankle / Brachial Index (ABI) - do not check if billed separately X- 1 5 Vital Signs Has the patient been seen at the hospital within the last three years: Yes Total Score: 160 Level Of Care: New/Established - Level 5 Electronic Signature(s) Signed: 06/01/2020 5:49:00 PM By: Carlene Coria RN Entered By: Carlene Coria on 06/01/2020 10:18:18 -------------------------------------------------------------------------------- Encounter Discharge Information Details Patient Name: Date of Service: [name], [name] 06/01/2020 9:15 A M Medical Record Number: [id number] Patient Account Number: [id number] Date of Birth/Sex: Treating RN: [birth year] (86 y.o. [name] Primary Care Zonnie Landen: [name], Man Other Clinician: Referring Armya Westerhoff: Treating Sparsh Callens/Extender: Lesly Rubenstein, Man Weeks in Treatment: 9 Encounter Discharge Information  Items Discharge Condition: Stable Ambulatory Status: Wheelchair Discharge Destination: Home Transportation: Private Auto Accompanied By: self Schedule Follow-up Appointment: Yes Clinical Summary of Care: Electronic Signature(s) Signed: 06/01/2020 5:27:04 PM By: Deon Pilling Entered By: Deon Pilling on 06/01/2020 10:43:18 -------------------------------------------------------------------------------- Lower Extremity Assessment Details Patient Name: Date of Service: [name], [name] 06/01/2020 9:15 A M Medical Record Number: [id number] Patient Account Number: [id number] Date of Birth/Sex: Treating RN: 1923/04/08 (86 y.o. Elam Dutch Primary Care Aneisa Karren: [name], Man Other Clinician: Referring Mohsen Odenthal: Treating Peniel Hass/Extender: Lesly Rubenstein, Man Weeks in Treatment: 9 Edema Assessment Assessed: [Left: Yes] [Right: Yes] Edema: [Left: No] [Right: No] Calf Left: Right: Point of Measurement: 38 cm From Medial Instep 23.4 cm 25 cm Ankle Left: Right: Point of Measurement: 11 cm From Medial Instep 16.7 cm 17.3 cm Vascular Assessment Pulses: Dorsalis Pedis Palpable: [Left:No] [Right:No] Electronic Signature(s) Signed: 06/01/2020 4:37:34 PM By: Baruch Gouty RN, BSN Entered By: Baruch Gouty on 06/01/2020 09:43:37 -------------------------------------------------------------------------------- Multi Wound Chart Details Patient Name: Date of Service: HA NSO Delane Ginger, [name] 06/01/2020 9:15 A M Medical Record Number: [id number] Patient Account Number: [id number] Date of Birth/Sex: Treating RN: 1922/11/13 (86 y.o. Orvan Falconer Primary Care Terrance Lanahan: [name], Man Other Clinician: Referring Audi Conover: Treating Edra Riccardi/Extender: Lesly Rubenstein, Man Weeks in Treatment: 9 Vital Signs Height(in): 64 Pulse(bpm): 89 Weight(lbs): 90 Blood Pressure(mmHg): 122/79 Body Mass Index(BMI): 15 Temperature(F): 98.4 Respiratory Rate(breaths/min): 18 Photos:  [3:No Photos Left, Posterior Lower Leg] [4:No Photos Sacrum] [5:No Photos Left, Anterior Lower Leg] Wound Location: [3:Gradually Appeared] [4:Gradually Appeared] [5:Gradually Appeared] Wounding Event: [3:Skin Tear] [4:Pressure Ulcer] [5:Venous Leg Ulcer] Primary Etiology: [3:N/A] [4:Glaucoma, Hypertension] [5:N/A] Comorbid History: [3:03/16/2020] [4:03/29/2020] [5:04/19/2020] Date Acquired: [3:9] [4:9] [5:6] Weeks of Treatment: [3:Healed - Epithelialized] [4:Open] [5:Healed - Epithelialized] Wound Status: [3:No] [4:No] [5:Yes] Clustered Wound: [3:0x0x0] [4:3.9x2.1x1.2] [5:0x0x0]  Measurements L x W x D (cm) [3:0] [4:6.432] [5:0] A (cm) : rea [3:0] [3:1.517] [5:0] Volume (cm) : [3:100.00%] [4:-9.20%] [5:100.00%] % Reduction in A rea: [3:100.00%] [4:27.20%] [5:100.00%] % Reduction in Volume: [4:10] Starting Position 1 (o'clock): [4:2] Ending Position 1 (o'clock): [4:1.2] Maximum Distance 1 (cm): [3:N/A] [4:Yes] [5:N/A] Undermining: [3:Full Thickness Without Exposed] [4:Category/Stage IV] [5:Full Thickness Without Exposed] Classification: [3:Support Structures N/A] [4:Medium] [5:Support Structures N/A] Exudate A mount: [3:N/A] [4:Serosanguineous] [5:N/A] Exudate Type: [3:N/A] [4:red, brown] [5:N/A] Exudate Color: [3:N/A] [4:Well defined, not attached] [5:N/A] Wound Margin: [3:N/A] [4:Medium (34-66%)] [5:N/A] Granulation A mount: [3:N/A] [4:Red, Pink] [5:N/A] Granulation Quality: [3:N/A] [4:Medium (34-66%)] [5:N/A] Necrotic A mount: [3:N/A] [4:None] [5:N/A] Wound Number: 7 8 N/A Photos: No Photos No Photos N/A Right, Lateral Lower Leg Left, Proximal, Anterior Lower Leg N/A Wound Location: Shear/Friction Gradually Appeared N/A Wounding Event: Skin Tear Skin Tear N/A Primary Etiology: Glaucoma, Hypertension Glaucoma, Hypertension N/A Comorbid History: 05/03/2020 05/17/2020 N/A Date Acquired: 4 2 N/A Weeks of Treatment: Open Open N/A Wound Status: No No N/A Clustered  Wound: 1.5x1.1x0.1 0.8x0.5x0.1 N/A Measurements L x W x D (cm) 1.296 0.314 N/A A (cm) : rea 0.13 0.031 N/A Volume (cm) : -37.60% 54.60% N/A % Reduction in A rea: 30.90% 55.10% N/A % Reduction in Volume: No No N/A Undermining: Full Thickness Without Exposed Full Thickness Without Exposed N/A Classification: Support Structures Support Structures Medium Medium N/A Exudate Amount: Serosanguineous Serosanguineous N/A Exudate Type: red, brown red, brown N/A Exudate Color: Distinct, outline attached Distinct, outline attached N/A Wound Margin: Large (67-100%) Large (67-100%) N/A Granulation Amount: Red, Pink Red N/A Granulation Quality: None Present (0%) None Present (0%) N/A Necrotic Amount: Fat Layer (Subcutaneous Tissue): Yes Fat Layer (Subcutaneous Tissue): Yes N/A Exposed Structures: Fascia: No Fascia: No Tendon: No Tendon: No Muscle: No Muscle: No Joint: No Joint: No Bone: No Bone: No Large (67-100%) Small (1-33%) N/A Epithelialization: Treatment Notes Electronic Signature(s) Signed: 06/01/2020 5:41:11 PM By: Linton Ham MD Signed: 06/01/2020 5:49:00 PM By: Carlene Coria RN Entered By: Linton Ham on 06/01/2020 10:33:41 -------------------------------------------------------------------------------- Multi-Disciplinary Care Plan Details Patient Name: Date of Service: [name], [name] 06/01/2020 9:15 A M Medical Record Number: [id number] Patient Account Number: [id number] Date of Birth/Sex: Treating RN: [birth year] (86 y.o. Orvan Falconer Primary Care Rober Skeels: [name], Man Other Clinician: Referring Miaisabella Bacorn: Treating Ilanna Deihl/Extender: Lesly Rubenstein, Man Weeks in Treatment: 9 Active Inactive Pressure Nursing Diagnoses: Knowledge deficit related to causes and risk factors for pressure ulcer development Knowledge deficit related to management of pressures ulcers Potential for impaired tissue integrity related to pressure, friction,  moisture, and shear Goals: Patient/caregiver will verbalize risk factors for pressure ulcer development Date Initiated: 03/29/2020 Target Resolution Date: 06/04/2020 Goal Status: Active Patient/caregiver will verbalize understanding of pressure ulcer management Date Initiated: 03/29/2020 Date Inactivated: 05/03/2020 Target Resolution Date: 04/30/2020 Goal Status: Met Interventions: Assess: immobility, friction, shearing, incontinence upon admission and as needed Assess offloading mechanisms upon admission and as needed Assess potential for pressure ulcer upon admission and as needed Provide education on pressure ulcers Notes: Wound/Skin Impairment Nursing Diagnoses: Impaired tissue integrity Knowledge deficit related to ulceration/compromised skin integrity Goals: Patient/caregiver will verbalize understanding of skin care regimen Date Initiated: 03/29/2020 Target Resolution Date: 06/04/2020 Goal Status: Active Interventions: Assess patient/caregiver ability to obtain necessary supplies Assess patient/caregiver ability to perform ulcer/skin care regimen upon admission and as needed Assess ulceration(s) every visit Provide education on ulcer and skin care Notes: Electronic Signature(s) Signed: 06/01/2020 5:49:00 PM By: Carlene Coria RN  Entered By: Carlene Coria on 06/01/2020 10:15:39 -------------------------------------------------------------------------------- Pain Assessment Details Patient Name: Date of Service: [name], [name] 06/01/2020 9:15 A M Medical Record Number: [id number] Patient Account Number: [id number] Date of Birth/Sex: Treating RN: 1923/07/17 (86 y.o. Elam Dutch Primary Care Lariya Kinzie: [name], Man Other Clinician: Referring Rhilee Currin: Treating Armya Westerhoff/Extender: Lesly Rubenstein, Man Weeks in Treatment: 9 Active Problems Location of Pain Severity and Description of Pain Patient Has Paino Yes Site Locations Pain Location: Pain in Ulcers With  Dressing Change: Yes Rate the pain. Current Pain Level: 0 Worst Pain Level: 4 Least Pain Level: 0 Character of Pain Describe the Pain: Aching Pain Management and Medication Current Pain Management: Other: time Is the Current Pain Management Adequate: Adequate How does your wound impact your activities of daily livingo Sleep: No Bathing: No Appetite: No Relationship With Others: No Bladder Continence: No Emotions: No Bowel Continence: No Hobbies: No Toileting: No Dressing: No Electronic Signature(s) Signed: 06/01/2020 4:37:34 PM By: Baruch Gouty RN, BSN Entered By: Baruch Gouty on 06/01/2020 09:31:02 -------------------------------------------------------------------------------- Patient/Caregiver Education Details Patient Name: Date of Service: HA NSO Delane Ginger, [name] 10/12/2021andnbsp9:15 A M Medical Record Number: [id number] Patient Account Number: [id number] Date of Birth/Gender: Treating RN: May 27, 1923 (86 y.o. Orvan Falconer Primary Care Physician: [name], Man Other Clinician: Referring Physician: Treating Physician/Extender: Lesly Rubenstein, Man Weeks in Treatment: 9 Education Assessment Education Provided To: Patient Education Topics Provided Pressure: Methods: Explain/Verbal Responses: See progress note Electronic Signature(s) Signed: 06/01/2020 5:49:00 PM By: Carlene Coria RN Entered By: Carlene Coria on 06/01/2020 10:16:01 -------------------------------------------------------------------------------- Wound Assessment Details Patient Name: Date of Service: [name], [name] 06/01/2020 9:15 A M Medical Record Number: [id number] Patient Account Number: [id number] Date of Birth/Sex: Treating RN: 1923/01/06 (86 y.o. Elam Dutch Primary Care Makisha Marrin: [name], Man Other Clinician: Referring Michall Noffke: Treating Burnell Hurta/Extender: Lesly Rubenstein, Man Weeks in Treatment: 9 Wound Status Wound Number: 3 Primary Etiology: Skin Tear Wound  Location: Left, Posterior Lower Leg Wound Status: Healed - Epithelialized Wounding Event: Gradually Appeared Date Acquired: 03/16/2020 Weeks Of Treatment: 9 Clustered Wound: No Photos Photo Uploaded By: Mikeal Hawthorne on 06/01/2020 11:47:27 Wound Measurements Length: (cm) Width: (cm) Depth: (cm) Area: (cm) Volume: (cm) 0 % Reduction in Area: 100% 0 % Reduction in Volume: 100% 0 0 0 Wound Description Classification: Full Thickness Without Exposed Support Structur es Electronic Signature(s) Signed: 06/01/2020 4:37:34 PM By: Baruch Gouty RN, BSN Entered By: Baruch Gouty on 06/01/2020 09:44:16 -------------------------------------------------------------------------------- Wound Assessment Details Patient Name: Date of Service: [name], [name] 06/01/2020 9:15 A M Medical Record Number: [id number] Patient Account Number: [id number] Date of Birth/Sex: Treating RN: 20-Apr-1923 (86 y.o. Elam Dutch Primary Care Yoav Okane: [name], Man Other Clinician: Referring Alexcis Bicking: Treating Jarrett Chicoine/Extender: Lesly Rubenstein, Man Weeks in Treatment: 9 Wound Status Wound Number: 4 Primary Etiology: Pressure Ulcer Wound Location: Sacrum Wound Status: Open Wounding Event: Gradually Appeared Comorbid History: Glaucoma, Hypertension Date Acquired: 03/29/2020 Weeks Of Treatment: 9 Clustered Wound: No Photos Photo Uploaded By: Mikeal Hawthorne on 06/01/2020 11:46:49 Wound Measurements Length: (cm) 3.9 Width: (cm) 2.1 Depth: (cm) 1.2 Area: (cm) 6.432 Volume: (cm) 7.719 % Reduction in Area: -9.2% % Reduction in Volume: 27.2% Epithelialization: None Tunneling: No Undermining: Yes Starting Position (o'clock): 10 Ending Position (o'clock): 2 Maximum Distance: (cm) 1.2 Wound Description Classification: Category/Stage IV Wound Margin: Well defined, not attached Exudate Amount: Medium Exudate Type: Serosanguineous Exudate Color: red, brown Foul Odor After Cleansing:  No Slough/Fibrino No Wound Bed  Granulation Amount: Medium (34-66%) Exposed Structure Granulation Quality: Red, Pink Fascia Exposed: No Necrotic Amount: Medium (34-66%) Fat Layer (Subcutaneous Tissue) Exposed: Yes Necrotic Quality: Adherent Slough Tendon Exposed: No Muscle Exposed: No Joint Exposed: No Bone Exposed: No Treatment Notes Wound #4 (Sacrum) 1. Cleanse With Wound Cleanser 2. Periwound Care Skin Prep 3. Primary Dressing Applied Collegen AG Hydrogel or K-Y Jelly Other primary dressing (specifiy in notes) 4. Secondary Dressing Foam Border Dressing 5. Secured With Self Adhesive Bandage Notes saline moistened gauze backing collagen. Electronic Signature(s) Signed: 06/01/2020 4:37:34 PM By: Baruch Gouty RN, BSN Entered By: Baruch Gouty on 06/01/2020 10:03:04 -------------------------------------------------------------------------------- Wound Assessment Details Patient Name: Date of Service: [name], [name] 06/01/2020 9:15 A M Medical Record Number: [id number] Patient Account Number: [id number] Date of Birth/Sex: Treating RN: Feb 18, 1923 (86 y.o. Elam Dutch Primary Care Aadi Bordner: [name], Man Other Clinician: Referring Hadden Steig: Treating Teondra Newburg/Extender: Lesly Rubenstein, Man Weeks in Treatment: 9 Wound Status Wound Number: 5 Primary Etiology: Venous Leg Ulcer Wound Location: Left, Anterior Lower Leg Wound Status: Healed - Epithelialized Wounding Event: Gradually Appeared Date Acquired: 04/19/2020 Weeks Of Treatment: 6 Clustered Wound: Yes Photos Photo Uploaded By: Mikeal Hawthorne on 06/01/2020 11:47:50 Wound Measurements Length: (cm) Width: (cm) Depth: (cm) Area: (cm) Volume: (cm) 0 % Reduction in Area: 100% 0 % Reduction in Volume: 100% 0 0 0 Wound Description Classification: Full Thickness Without Exposed Support Structur es Electronic Signature(s) Signed: 06/01/2020 4:37:34 PM By: Baruch Gouty RN, BSN Entered By:  Baruch Gouty on 06/01/2020 09:44:16 -------------------------------------------------------------------------------- Wound Assessment Details Patient Name: Date of Service: [name], [name] 06/01/2020 9:15 A M Medical Record Number: [id number] Patient Account Number: [id number] Date of Birth/Sex: Treating RN: [birth year] (86 y.o. Elam Dutch Primary Care Treydon Henricks: [name], Man Other Clinician: Referring Maleak Brazzel: Treating Kendrix Orman/Extender: Lesly Rubenstein, Man Weeks in Treatment: 9 Wound Status Wound Number: 7 Primary Etiology: Skin Tear Wound Location: Right, Lateral Lower Leg Wound Status: Open Wounding Event: Shear/Friction Comorbid History: Glaucoma, Hypertension Date Acquired: 05/03/2020 Weeks Of Treatment: 4 Clustered Wound: No Photos Photo Uploaded By: Mikeal Hawthorne on 06/01/2020 11:47:27 Wound Measurements Length: (cm) 1.5 Width: (cm) 1.1 Depth: (cm) 0.1 Area: (cm) 1.296 Volume: (cm) 0.13 % Reduction in Area: -37.6% % Reduction in Volume: 30.9% Epithelialization: Large (67-100%) Tunneling: No Undermining: No Wound Description Classification: Full Thickness Without Exposed Support Structures Wound Margin: Distinct, outline attached Exudate Amount: Medium Exudate Type: Serosanguineous Exudate Color: red, brown Foul Odor After Cleansing: No Slough/Fibrino No Wound Bed Granulation Amount: Large (67-100%) Exposed Structure Granulation Quality: Red, Pink Fascia Exposed: No Necrotic Amount: None Present (0%) Fat Layer (Subcutaneous Tissue) Exposed: Yes Tendon Exposed: No Muscle Exposed: No Joint Exposed: No Bone Exposed: No Treatment Notes Wound #7 (Right, Lateral Lower Leg) 1. Cleanse With Wound Cleanser Soap and water 2. Periwound Care Moisturizing lotion TCA Cream 3. Primary Dressing Applied Calcium Alginate Ag 4. Secondary Dressing Dry Gauze 6. Support Layer Applied Kerlix/Coban Notes netting. Electronic Signature(s) Signed:  06/01/2020 4:37:34 PM By: Baruch Gouty RN, BSN Entered By: Baruch Gouty on 06/01/2020 09:44:47 -------------------------------------------------------------------------------- Wound Assessment Details Patient Name: Date of Service: [name], [name] 06/01/2020 9:15 A M Medical Record Number: [id number] Patient Account Number: [id number] Date of Birth/Sex: Treating RN: 1923/08/14 (86 y.o. Elam Dutch Primary Care Laural Eiland: [name], Man Other Clinician: Referring Linzee Depaul: Treating Aryanne Gilleland/Extender: Lesly Rubenstein, Man Weeks in Treatment: 9 Wound Status Wound Number: 8 Primary Etiology: Skin Tear Wound Location: Left, Proximal, Anterior Lower  Leg Wound Status: Open Wounding Event: Gradually Appeared Comorbid History: Glaucoma, Hypertension Date Acquired: 05/17/2020 Weeks Of Treatment: 2 Clustered Wound: No Photos Photo Uploaded By: Mikeal Hawthorne on 06/01/2020 11:47:50 Wound Measurements Length: (cm) 0.8 Width: (cm) 0.5 Depth: (cm) 0.1 Area: (cm) 0.314 Volume: (cm) 0.031 Wound Description Classification: Full Thickness Without Exposed Support Struct Wound Margin: Distinct, outline attached Exudate Amount: Medium Exudate Type: Serosanguineous Exudate Color: red, brown Foul Odor After Cleansing: Slough/Fibrino % Reduction in Area: 54.6% % Reduction in Volume: 55.1% Epithelialization: Small (1-33%) Tunneling: No Undermining: No ures No No Wound Bed Granulation Amount: Large (67-100%) Exposed Structure Granulation Quality: Red Fascia Exposed: No Necrotic Amount: None Present (0%) Fat Layer (Subcutaneous Tissue) Exposed: Yes Tendon Exposed: No Muscle Exposed: No Joint Exposed: No Bone Exposed: No Treatment Notes Wound #8 (Left, Proximal, Anterior Lower Leg) 1. Cleanse With Wound Cleanser Soap and water 2. Periwound Care Moisturizing lotion TCA Cream 3. Primary Dressing Applied Calcium Alginate Ag 4. Secondary Dressing Dry Gauze 6.  Support Layer Applied Kerlix/Coban Notes netting. Electronic Signature(s) Signed: 06/01/2020 4:37:34 PM By: Baruch Gouty RN, BSN Entered By: Baruch Gouty on 06/01/2020 09:45:27 -------------------------------------------------------------------------------- Vitals Details Patient Name: Date of Service: [name], [name] 06/01/2020 9:15 A M Medical Record Number: [id number] Patient Account Number: [id number] Date of Birth/Sex: Treating RN: April 18, 1923 (86 y.o. Elam Dutch Primary Care Roshni Burbano: [name], Man Other Clinician: Referring Kristopher Attwood: Treating Amylia Collazos/Extender: Lesly Rubenstein, Man Weeks in Treatment: 9 Vital Signs Time Taken: 09:28 Temperature (F): 98.4 Height (in): 64 Pulse (bpm): 89 Weight (lbs): 90 Respiratory Rate (breaths/min): 18 Body Mass Index (BMI): 15.4 Blood Pressure (mmHg): 122/79 Reference Range: 80 - 120 mg / dl Electronic Signature(s) Signed: 06/01/2020 4:37:34 PM By: Baruch Gouty RN, BSN Entered By: Baruch Gouty on 06/01/2020 09:30:22

## 2020-06-01 NOTE — Progress Notes (Signed)
AMMARA, RAJ ([id number]) Visit Report for 06/01/2020 HPI Details Patient Name: Date of Service: [name], [name] IS A. 06/01/2020 9:15 A M Medical Record Number: [id number] Patient Account Number: [id number] Date of Birth/Sex: Treating RN: [birth year] (86 y.o. Orvan Falconer Primary Care Provider: [name], Man Other Clinician: Referring Provider: Treating Provider/Extender: Lesly Rubenstein, Man Weeks in Treatment: 9 History of Present Illness HPI Description: 86 year old female who was admitted in late May and discharged early June for suspected infectious diarrhea and after undergoing inpatient management with hydration, IV antibiotics, she was released to skilled nursing facility for recovery, during this time she developed a sacral wound that was classified as stage III for which they have been using Santyl and gauze packing with saline with daily changes. She also developed left leg skin tears and then shallow wounds that were addressed with silver alginate dressings. Patient originally is from assisted living and owing to her acute illness in June was transition to skilled nursing and the intention is for her to go back to assisted living once her recovery is complete. Patient denies any significant pain or discomfort in the sacrum or the leg areas. Intake and appetite have been good, denies any diarrhea or constipation. She denies any fevers or chills. Patient has atrial fibrillation with irregular heart rhythm with frequent episodes of tachycardia in the 100-120 range, she also runs borderline low blood pressures. Other medical problems includes chronic anemia latest hemoglobin 8.3 she is on iron supplements, mild anasarca with cirrhosis identified per CT scan of the abdomen, infrarenal AAA with partial clot 3.5 cm size. ABI in the clinic was difficult to obtain owing to edema in the leg, marked at 0.62, note that CT scan performed in the hospital showed significant  atherosclerotic disease in the major arteries 8/16; this is a patient who is at the skilled unit of [facility] although she was in the independent up until May. She was hospitalized and developed a pressure sore on her sacrum. Apparently she also had some abrasions to her left lower leg which was traumatic although I know none of the details. She was noted in this clinic to probably have PAD although this is not been aggressively investigated. We did ask for a x-ray of the lower sacrum according to the patient who seems cognitively intact that has not been done 8/30; 2-week follow-up. [facility] [place]. Pressure sore on her sacrum not much change today although the surface looks somewhat better with the Santyl. We are not doing well with the left leg, part of this is no doubt inadequate compression wrapping a friend's home which is sometimes just over the wound area. She has 2 new areas on the lateral part of the left leg the original wound on the medial part. We finally did get the x-ray of the lower sacrum which was done at the facility that was negative 9/13; 2-week follow-up. Apparently a friend's home cannot handle a wound VAC, which are case manager tried to call them. We are using silver collagen to this area. She has an area also on the left leg but a new area today on the right. 9/27 2-week follow-up. The patient's area on the lower sacrum/coccyx actually looks some better. Healthy looking tissue there is no exposed bone She has areas on the left leg with superficial loss of epithelium. Severely dry scaly skin likely chronic venous insufficiency On the right most of what she has looks  like possible skin cancers. 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 She was transitioned back to Eliquis. She has chronic atrial fibrillation. She also  apparently had a Mohs surgery procedure on the right leg I believe. She has had some healing of the open wounds on both legs she has 1 on the right lateral leg which may be actually a Mohs surgery site. She has an area on the upper left lateral just below the tibial tuberosity. 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 This is about the same I think in terms of dimensions Electronic Signature(s) Signed: 06/01/2020 5:41:11 PM By: Linton Ham MD Entered By: Linton Ham on 06/01/2020 10:35:59 -------------------------------------------------------------------------------- Physical Exam Details Patient Name: Date of Service: [name], [name] 06/01/2020 9:15 A M Medical Record Number: [id number] Patient Account Number: [id number] Date of Birth/Sex: Treating RN: 07-Jul-1923 (86 y.o. Orvan Falconer Primary Care Provider: [name], Man Other Clinician: Referring Provider: Treating Provider/Extender: Lesly Rubenstein, Man Weeks in Treatment: 9 Constitutional Sitting or standing Blood Pressure is within target range for patient.. Pulse regular and within target range for patient.[name] Respirations regular, non-labored and within target range.. Temperature is normal and within the target range for the patient.[name] Appears in no distress. 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 About 1.2 cm of undermining from 10-2 o'clock but no palpable bone. No evidence of surrounding infection She has 2 remaining superficial areas 1 on the left anterior lower leg and on the right lateral area there may have been a Mohs procedure here I am just not sure since the last time she was here Electronic Signature(s) Signed: 06/01/2020 5:41:11 PM By: Linton Ham MD Entered By: Linton Ham on 06/01/2020 10:37:22 -------------------------------------------------------------------------------- Physician Orders Details Patient Name: Date of Service: HA  NSO N, [name] 06/01/2020 9:15 A M Medical Record Number: [id number] Patient Account Number: [id number] Date of Birth/Sex: Treating RN: Feb 01, 1923 (86 y.o. Orvan Falconer Primary Care Provider: [name], Man Other Clinician: Referring Provider: Treating Provider/Extender: Lesly Rubenstein, Man Weeks in Treatment: 9 Verbal / Phone Orders: No Diagnosis Coding ICD-10 Coding Code Description L97.221 Non-pressure chronic ulcer of left calf limited to breakdown of skin L89.154 Pressure ulcer of sacral region, stage 4 I48.11 Longstanding persistent atrial fibrillation I87.312 Chronic venous hypertension (idiopathic) with ulcer of left lower extremity Follow-up Appointments Return appointment in 3 weeks. Dressing Change Frequency Change Dressing every other day. - all wounds Skin Barriers/Peri-Wound Care Moisturizing lotion - both legs with each dressing change TCA Cream or Ointment - mixed with lotion to both legs Wound Cleansing Clean wound with Normal Saline. - or wound cleanser Primary Wound Dressing Wound #4 Sacrum Silver Collagen - apply saline moistened gauze over collagen Wound #7 Right,Lateral Lower Leg Calcium Alginate with Silver Wound #8 Left,Proximal,Anterior Lower Leg Calcium Alginate with Silver Secondary Dressing Wound #4 Sacrum Foam Border Wound #7 Right,Lateral Lower Leg Dry Gauze Wound #8 Left,Proximal,Anterior Lower Leg Dry Gauze Edema Control Kerlix and Coban - Bilateral - ***WRAP FROM BASE OF TOES TO JUST BELOW BEND OF KNEE*** Avoid standing for long periods of time Elevate legs to the level of the heart or above for 30 minutes daily and/or when sitting, a frequency of: - throughout the day Off-Loading Low air-loss mattress (Group 2) Roho cushion for wheelchair Turn and reposition every 2 hours Electronic Signature(s)  Signed: 06/01/2020 5:41:11 PM By: Linton Ham MD Signed: 06/01/2020 5:49:00 PM By: Carlene Coria RN Entered By: Carlene Coria on  06/01/2020 10:16:49 -------------------------------------------------------------------------------- Problem List Details Patient Name: Date of Service: [name], [name] 06/01/2020 9:15 A M Medical Record Number: [id number] Patient Account Number: [id number] Date of Birth/Sex: Treating RN: [birth year] (86 y.o. Orvan Falconer Primary Care Provider: [name], Man Other Clinician: Referring Provider: Treating Provider/Extender: Lesly Rubenstein, Man Weeks in Treatment: 9 Active Problems ICD-10 Encounter Code Description Active Date MDM Diagnosis L97.221 Non-pressure chronic ulcer of left calf limited to breakdown of skin 03/29/2020 No Yes L89.154 Pressure ulcer of sacral region, stage 4 04/05/2020 No Yes I48.11 Longstanding persistent atrial fibrillation 03/29/2020 No Yes I87.312 Chronic venous hypertension (idiopathic) with ulcer of left lower extremity 04/19/2020 No Yes Inactive Problems Resolved Problems Electronic Signature(s) Signed: 06/01/2020 5:41:11 PM By: Linton Ham MD Entered By: Linton Ham on 06/01/2020 10:33:24 -------------------------------------------------------------------------------- Progress Note Details Patient Name: Date of Service: [name], [name] 06/01/2020 9:15 A M Medical Record Number: [id number] Patient Account Number: [id number] Date of Birth/Sex: Treating RN: Jul 07, 1923 (86 y.o. Orvan Falconer Primary Care Provider: [name], Man Other Clinician: Referring Provider: Treating Provider/Extender: Lesly Rubenstein, Man Weeks in Treatment: 9 Subjective History of Present Illness (HPI) 86 year old female who was admitted in late May and discharged early June for suspected infectious diarrhea and after undergoing inpatient management with hydration, IV antibiotics, she was released to skilled nursing facility for recovery, during this time she developed a sacral wound that was classified as stage III for which they have been using Santyl and  gauze packing with saline with daily changes. She also developed left leg skin tears and then shallow wounds that were addressed with silver alginate dressings. Patient originally is from assisted living and owing to her acute illness in June was transition to skilled nursing and the intention is for her to go back to assisted living once her recovery is complete. Patient denies any significant pain or discomfort in the sacrum or the leg areas. Intake and appetite have been good, denies any diarrhea or constipation. She denies any fevers or chills. Patient has atrial fibrillation with irregular heart rhythm with frequent episodes of tachycardia in the 100-120 range, she also runs borderline low blood pressures. Other medical problems includes chronic anemia latest hemoglobin 8.3 she is on iron supplements, mild anasarca with cirrhosis identified per CT scan of the abdomen, infrarenal AAA with partial clot 3.5 cm size. ABI in the clinic was difficult to obtain owing to edema in the leg, marked at 0.62, note that CT scan performed in the hospital showed significant atherosclerotic disease in the major arteries 8/16; this is a patient who is at the skilled unit of [place] although she was in the independent up until May. She was hospitalized and developed a pressure sore on her sacrum. Apparently she also had some abrasions to her left lower leg which was traumatic although I know none of the details. She was noted in this clinic to probably have PAD although this is not been aggressively investigated. We did ask for a x-ray of the lower sacrum according to the patient who seems cognitively intact that has not been done 8/30; 2-week follow-up. [facility] is [facility] [place]. Pressure sore on her sacrum not much change today although the surface looks somewhat better with the Santyl. We are not doing well with the left leg, part  of this is no doubt inadequate compression  wrapping a friend's home which is sometimes just over the wound area. She has 2 new areas on the lateral part of the left leg the original wound on the medial part. We finally did get the x-ray of the lower sacrum which was done at the facility that was negative 9/13; 2-week follow-up. Apparently a friend's home cannot handle a wound VAC, which are case manager tried to call them. We are using silver collagen to this area. She has an area also on the left leg but a new area today on the right. 9/27 2-week follow-up. The patient's area on the lower sacrum/coccyx actually looks some better. Healthy looking tissue there is no exposed bone ooShe has areas on the left leg with superficial loss of epithelium. Severely dry scaly skin likely chronic venous insufficiency ooOn the right most of what she has looks like possible skin cancers. 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 She was transitioned back to Eliquis. She has chronic atrial fibrillation. She also apparently had a Mohs surgery procedure on the right leg I believe. She has had some healing of the open wounds on both legs she has 1 on the right lateral leg which may be actually a Mohs surgery site. She has an area on the upper left lateral just below the tibial tuberosity. 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 This is about the same I think in terms of dimensions Objective Constitutional Sitting or standing Blood Pressure is within target range for patient.. Pulse regular and within target range for patient.[name] Respirations regular, non-labored and within target range.. Temperature is normal and within the target range for the patient.[name] Appears in no distress. Vitals Time Taken: 9:28 AM, Height: 64 in, Weight: 90 lbs, BMI: 15.4, Temperature: 98.4 F, Pulse: 89 bpm, Respiratory Rate: 18 breaths/min, Blood Pressure: 122/79  mmHg. 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 About 1.2 cm of undermining from 10-2 o'clock but no palpable bone. No evidence of surrounding infection ooShe has 2 remaining superficial areas 1 on the left anterior lower leg and on the right lateral area there may have been a Mohs procedure here I am just not sure since the last time she was here Integumentary (Hair, Skin) Wound #3 status is Healed - Epithelialized. Original cause of wound was Gradually Appeared. The wound is located on the Left,Posterior Lower Leg. The wound measures 0cm length x 0cm width x 0cm depth; 0cm^2 area and 0cm^3 volume. Wound #4 status is Open. Original cause of wound was Gradually Appeared. The wound is located on the Sacrum. The wound measures 3.9cm length x 2.1cm width x 1.2cm depth; 6.432cm^2 area and 7.719cm^3 volume. There is Fat Layer (Subcutaneous Tissue) exposed. There is no tunneling noted, however, there is undermining starting at 10:00 and ending at 2:00 with a maximum distance of 1.2cm. There is a medium amount of serosanguineous drainage noted. The wound margin is well defined and not attached to the wound base. There is medium (34-66%) red, pink granulation within the wound bed. There is a medium (34- 66%) amount of necrotic tissue within the wound bed including Adherent Slough. Wound #5 status is Healed - Epithelialized. Original cause of wound was Gradually Appeared. The wound is located on the Left,Anterior Lower Leg. The wound measures 0cm length x 0cm  width x 0cm depth; 0cm^2 area and 0cm^3 volume. Wound #7 status is Open. Original cause of wound was Shear/Friction. The wound is located on the Right,Lateral Lower Leg. The wound measures 1.5cm length x 1.1cm width x 0.1cm depth; 1.296cm^2 area and 0.13cm^3 volume. There is Fat Layer (Subcutaneous Tissue) exposed. There is no tunneling or undermining noted. There is a medium amount of  serosanguineous drainage noted. The wound margin is distinct with the outline attached to the wound base. There is large (67-100%) red, pink granulation within the wound bed. There is no necrotic tissue within the wound bed. Wound #8 status is Open. Original cause of wound was Gradually Appeared. The wound is located on the Left,Proximal,Anterior Lower Leg. The wound measures 0.8cm length x 0.5cm width x 0.1cm depth; 0.314cm^2 area and 0.031cm^3 volume. There is Fat Layer (Subcutaneous Tissue) exposed. There is no tunneling or undermining noted. There is a medium amount of serosanguineous drainage noted. The wound margin is distinct with the outline attached to the wound base. There is large (67-100%) red granulation within the wound bed. There is no necrotic tissue within the wound bed. 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 Dressing Change Frequency: Change Dressing every other day. - all wounds Skin Barriers/Peri-Wound Care: Moisturizing lotion - both legs with each dressing change TCA Cream or Ointment - mixed with lotion to both legs Wound Cleansing: Clean wound with Normal Saline. - or wound cleanser Primary Wound Dressing: Wound #4 Sacrum: Silver Collagen - apply saline moistened gauze over collagen Wound #7 Right,Lateral Lower Leg: Calcium Alginate with Silver Wound #8 Left,Proximal,Anterior Lower Leg: Calcium Alginate with Silver Secondary Dressing: Wound #4 Sacrum: Foam Border Wound #7 Right,Lateral Lower Leg: Dry Gauze Wound #8 Left,Proximal,Anterior Lower Leg: Dry Gauze Edema Control: Kerlix and Coban - Bilateral - ***WRAP FROM BASE OF TOES TO JUST BELOW BEND OF KNEE*** Avoid standing  for long periods of time Elevate legs to the level of the heart or above for 30 minutes daily and/or when sitting, a frequency of: - throughout the day Off-Loading: Low air-loss mattress (Group 2) Roho cushion for wheelchair Turn and reposition every 2 hours 1. We continue with silver alginate to the areas on both lower legs that are still open. We are using a mixture of triamcinolone and Cetaphil cream to the dry flaking skin 2. With regards to her lower sacral wound was still using silver collagen with backing wet to dry gauze. 3. I do not think we could put a wound VAC on her in the sacrum 4. She is back on Eliquis very fragile lady. Hopefully she will not fall Electronic Signature(s) Signed: 06/01/2020 5:41:11 PM By: Linton Ham MD Entered By: Linton Ham on 06/01/2020 10:39:42 -------------------------------------------------------------------------------- SuperBill Details Patient Name: Date of Service: [name], [name] 06/01/2020 Medical Record Number: [id number] Patient Account Number: [id number] Date of Birth/Sex: Treating RN: 08/28/1922 (86 y.o. Orvan Falconer Primary Care Provider: [name], Man Other Clinician: Referring Provider: Treating Provider/Extender: Lesly Rubenstein, Man Weeks in Treatment: 9 Diagnosis Coding ICD-10 Codes Code Description [phone] Non-pressure chronic ulcer of left calf limited to breakdown of skin L89.154 Pressure ulcer of sacral region, stage 4 I48.11 Longstanding persistent atrial fibrillation I87.312 Chronic venous hypertension (idiopathic) with ulcer of left lower extremity Facility Procedures CPT4  Code: 82800349 Description: 17915 - WOUND CARE VISIT-LEV 5 EST PT Modifier: Quantity: 1 Physician Procedures : CPT4 Code Description Modifier [id number] - WC PHYS LEVEL 3 - EST PT ICD-10 Diagnosis Description L89.154 Pressure ulcer of sacral region, stage 4 L97.221 Non-pressure chronic ulcer of left calf limited to breakdown of  skin Quantity: 1 Electronic Signature(s) Signed: 06/01/2020 5:41:11 PM By: Linton Ham MD Entered By: Linton Ham on 06/01/2020 10:40:06

## 2020-06-06 NOTE — Progress Notes (Signed)
Cardiology Office Note:    Date:  06/08/2020   ID:  [name], DOB Jan 16, 1923, MRN [id number]  PCP:  [name], [name], NP  Cardiologist:  No primary care provider on file.  Electrophysiologist:  None   Referring MD: [name], [name], NP   Chief Complaint  Patient presents with   Atrial Fibrillation    History of Present Illness:    Terri Acosta is a 86 y.o. female with a hx of paroxysmal atrial fibrillation, hypertension, asthma who presents as a hospital follow-up.  She was admitted to [facility] on 01/18/2020 with diarrhea, weakness, and atrial fibrillation with RVR.  Cardiology was consulted.  She reported that she was diagnosed with atrial fibrillation 6 or 7 years prior.  Had not seen a cardiologist, has been managed by PCP.  She had not been started on anticoagulation.  Given CHA2DS2-VASc score 4 (age x2, female, hypertension), and no history of bleeding or falls, discussed risks and benefits of anticoagulation with patient and she was agreeable to starting anticoagulation.  She was started on Eliquis 2.5 mg twice daily given her age/weight.  Echocardiogram on 01/19/2020 showed normal biventricular function, moderate biatrial dilatation, moderate MR/TR, RVSP 46 mmHg.  She was discharged on metoprolol 100 mg twice daily and Cardizem 120 mg daily for rate control.  At follow-up visit on 02/25/2020, risks and benefits of anticoagulation were discussed in patient felt that given her fall risk she would prefer to be off anticoagulation.  She was admitted to [facility] from 10/4 through 05/26/2020 with acute CVA, with MRI showing acute ischemic left MCA territory infarct.  She was restarted on Eliquis.  Echo on 05/25/2020 showed LVEF 60 to 73%, grade 2 diastolic dysfunction, normal RV function, severe left atrial dilatation, mild right atrial dilatation, mild MR.  Zio patch x3 days on 03/18/2020 showed high percent AF burden, average rate 97 bpm.  Since her hospitalization, she reports she has been doing okay.   Denies any deficits from her stroke.  Taking Eliquis, denies any falls or bleeding issues. Denies any palpitations, chest pain, or dyspnea.   Wt Readings from Last 3 Encounters:  05/27/20 106 lb 2.2 oz (48.1 kg)  05/24/20 106 lb 14.8 oz (48.5 kg)  05/24/20 104 lb 1.6 oz (47.2 kg)     Past Medical History:  Diagnosis Date   Abnormal uterine bleeding    Actinic keratosis 02/13/2012   Acute bronchospasm 10/31/2011   Cancer ([place]) 04/2008   Skin cancer of low back [name], MD   Cataract    Disorder of bone and cartilage, unspecified 02/18/2007   Dizziness and giddiness 08/30/2010   Dysmenorrhea    Edema 12/19/2011   Endometriosis    Intrinsic asthma, unspecified 12/01/1936   Osteoarthrosis, unspecified whether generalized or localized, unspecified site 08/20/2006   Osteoporosis    Other abnormal blood chemistry 03/14/2011   Other and unspecified hyperlipidemia 10/18/2010   Palpitations 02/13/2012   Postmenopausal bleeding 08/30/2010   Shortness of breath 11/28/2011   Supraventricular premature beats 05/23/2011   Unspecified essential hypertension 08/20/2006    Past Surgical History:  Procedure Laterality Date   CARPAL TUNNEL RELEASE  2004   S. Norris MD   CATARACT EXTRACTION W/ INTRAOCULAR LENS IMPLANT Right 2010   CATARACT EXTRACTION W/ INTRAOCULAR LENS IMPLANT Left 10/2012   DILATION AND CURETTAGE OF UTERUS     EYE SURGERY Right 07/2009   cataract extraction/IOLI [name], MD   KNEE ARTHROSCOPY Right 2008   Torn meniscus, Dr. Veverly Fells  Current Medications: Current Meds  Medication Sig   acetaminophen (TYLENOL) 325 MG tablet Take 2 tablets (650 mg total) by mouth every 4 (four) hours as needed for mild pain (or temp > 37.5 C (99.5 F)).   Amino Acids-Protein Hydrolys (FEEDING SUPPLEMENT, PRO-STAT SUGAR FREE 64,) LIQD Take 30 mLs by mouth 2 (two) times daily with a meal.    apixaban (ELIQUIS) 2.5 MG TABS tablet Take 1 tablet (2.5 mg total) by  mouth 2 (two) times daily.   ARGININE PO Take by mouth. Arginaid Extra (whey protein-arginine-c-e-zinc) 6-4.5-250 gram-gram-mg liquid. Twice A Day Between Meals 222mL, oral, Twice A Day Between Meals   atorvastatin (LIPITOR) 20 MG tablet Take 1 tablet (20 mg total) by mouth daily.   Dextran 70-Hypromellose (ARTIFICIAL TEARS) 0.1-0.3 % SOLN Apply 1 drop to eye in the morning, at noon, in the evening, and at bedtime. Both eyes   diltiazem (CARDIZEM CD) 120 MG 24 hr capsule Take 1 capsule (120 mg total) by mouth daily.   ferrous sulfate 325 (65 FE) MG tablet Take 325 mg by mouth. Once a day on Sunday, Tuesday, Thursday and Saturday   hydrocerin (EUCERIN) CREA Apply 1 application topically every other day.   loperamide (IMODIUM) 2 MG capsule Take 1 capsule (2 mg total) by mouth every 6 (six) hours as needed for diarrhea or loose stools.   magnesium oxide (MAG-OX) 400 MG tablet Take 400 mg by mouth daily.   metoprolol tartrate (LOPRESSOR) 50 MG tablet Take 75 mg by mouth 2 (two) times daily.   mirtazapine (REMERON) 7.5 MG tablet Take 7.5 mg by mouth every other day.    multivitamin-lutein (OCUVITE-LUTEIN) CAPS capsule Take 1 capsule by mouth in the morning and at bedtime.    Nutritional Supplements (ARGINAID PO) Take 237 mLs by mouth 2 (two) times daily.   potassium chloride (KLOR-CON) 10 MEQ tablet Take 30 mEq by mouth daily.    saccharomyces boulardii (FLORASTOR) 250 MG capsule Take 250 mg by mouth 2 (two) times daily.   torsemide (DEMADEX) 20 MG tablet Take 20 mg by mouth every other day.    triamcinolone cream (KENALOG) 0.1 % Apply 1 application topically every other day. Once a day. Apply to legs with esch dressing change.   vitamin B-12 (CYANOCOBALAMIN) 1000 MCG tablet Take 1,000 mcg by mouth daily.   zinc oxide 20 % ointment Apply 1 application topically as needed for irritation.     Allergies:   Lomotil [diphenoxylate], Sulfa antibiotics, and Amlodipine   Social History    Socioeconomic History   Marital status: Widowed    Spouse name: Not on file   Number of children: Not on file   Years of education: Not on file   Highest education level: Not on file  Occupational History   Not on file  Tobacco Use   Smoking status: Former Smoker    Quit date: 02/11/1978    Years since quitting: 42.3   Smokeless tobacco: Never Used  Vaping Use   Vaping Use: Never used  Substance and Sexual Activity   Alcohol use: Yes   Drug use: No   Sexual activity: Not Currently  Other Topics Concern   Not on file  Social History Narrative   Lives alone in a apartment at [facility] in the independent living area since 2007   The patient is not exercising regularly, walking with walker   No specific diet.   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   She has a  living will, POA   Former smoker, stopped 1979   Alcohol - one glass of wine at night   Social Determinants of Health   Financial Resource Strain:    Difficulty of Paying Living Expenses: Not on file  Food Insecurity:    Worried About Charity fundraiser in the Last Year: Not on file   [employer] of Food in the Last Year: Not on file  Transportation Needs:    Lack of Transportation (Medical): Not on file   Lack of Transportation (Non-Medical): Not on file  Physical Activity:    Days of Exercise per Week: Not on file   Minutes of Exercise per Session: Not on file  Stress:    Feeling of Stress : Not on file  Social Connections:    Frequency of Communication with Friends and Family: Not on file   Frequency of Social Gatherings with Friends and Family: Not on file   Attends Religious Services: Not on file   Active Member of Clubs or Organizations: Not on file   Attends Archivist Meetings: Not on file   Marital Status: Not on file     Family History: The patient's family history includes COPD in her brother; Diabetes in her brother.  ROS:   Please see the history  of present illness.     All other systems reviewed and are negative.  EKGs/Labs/Other Studies Reviewed:    The following studies were reviewed today:   EKG:  EKG is ordered today.  The ekg ordered today demonstrates atrial flutter with variable conduction, low voltage, poor R wave progression, rate 91  Recent Labs: 05/24/2020: ALT 17 05/25/2020: TSH 2.095 05/26/2020: BUN 20; Creatinine, Ser 0.60; Hemoglobin 10.6; Magnesium 2.0; Platelets 279; Potassium 3.2; Sodium 142  Recent Lipid Panel    Component Value Date/Time   CHOL 133 05/25/2020 0654   TRIG 73 05/25/2020 0654   HDL 39 (L) 05/25/2020 0654   CHOLHDL 3.4 05/25/2020 0654   VLDL 15 05/25/2020 0654   LDLCALC 79 05/25/2020 0654   LDLCALC 110 (H) 07/14/2019 0820    Physical Exam:    VS:  BP 112/74    Pulse (!) 104    SpO2 96%     Wt Readings from Last 3 Encounters:  05/27/20 106 lb 2.2 oz (48.1 kg)  05/24/20 106 lb 14.8 oz (48.5 kg)  05/24/20 104 lb 1.6 oz (47.2 kg)     GEN: frail, in no acute distress HEENT: Normal NECK: No JVD; No carotid bruits CARDIAC: irregular, no murmurs, rubs, gallops RESPIRATORY:  Clear to auscultation without rales, wheezing or rhonchi  ABDOMEN: Soft, non-tender, non-distended MUSCULOSKELETAL:  BLE wrapped SKIN: Warm and dry NEUROLOGIC:  Alert and oriented x 3 PSYCHIATRIC:  Normal affect   ASSESSMENT:    1. Atrial fibrillation, unspecified type [facility])    PLAN:    Atrial fibrillation/flutter: CHA2DS2-VASc score 6 (age x2, female, hypertension, CVA). Echocardiogram on 01/19/2020 showed normal biventricular function, moderate biatrial dilatation, moderate MR/TR, RVSP 46 mmHg.  At follow-up visit on 02/25/2020, risks and benefits of anticoagulation were discussed in patient felt that given her fall risk she would prefer to be off anticoagulation.  She had no prior history of stroke.  She was admitted to [facility] from 10/4 through 05/26/2020 with acute CVA, with MRI showing acute ischemic left MCA  territory infarct.  She was restarted on Eliquis.  Echo on 05/25/2020 showed LVEF 60 to 40%, grade 2 diastolic dysfunction, normal RV function, severe left atrial  dilatation, mild right atrial dilatation, mild MR.  Zio patch x3 days on 03/18/2020 showed high percent AF burden, average rate 97 bpm. -Continue metoprolol 75 mg twice daily and Cardizem 120 mg daily for rate control.   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  Mild on echo in October 2021  Hypertension: On Lopressor and Cardizem as above.  Appears controlled  LE edema: On torsemide 20 mg every other day at her facility.    CVA: admitted to [facility] from 10/4 through 05/26/2020 with acute CVA, with MRI showing acute ischemic left MCA territory infarct.  Continue statin and Eliquis.  RTC in 6 months  Medication Adjustments/Labs and Tests Ordered: Current medicines are reviewed at length with the patient today.  Concerns regarding medicines are outlined above.  Orders Placed This Encounter  Procedures   EKG 12-Lead   No orders of the defined types were placed in this encounter.   Patient Instructions  Medication Instructions:  Your physician recommends that you continue on your current medications as directed. Please refer to the Current Medication list given to you today.  *If you need a refill on your cardiac medications before your next appointment, please call your pharmacy*  Follow-Up: At [facility], you and your health needs are our priority.  As part of our continuing mission to provide you with exceptional heart care, we have created designated Provider Care Teams.  These Care Teams include your primary Cardiologist (physician) and Advanced Practice Providers (APPs -  Physician Assistants and Nurse Practitioners) who all work together to provide you with the care you need, when you need it.  We recommend signing up  for the patient portal called "MyChart".  Sign up information is provided on this After Visit Summary.  MyChart is used to connect with patients for Virtual Visits (Telemedicine).  Patients are able to view lab/test results, encounter notes, upcoming appointments, etc.  Non-urgent messages can be sent to your provider as well.   To learn more about what you can do with MyChart, go to [email or web address].    Your next appointment:   6 month(s)  The format for your next appointment:   In Person  Provider:   Oswaldo Milian, MD      Signed, Donato Heinz, MD  06/08/2020 12:23 PM    [place]

## 2020-06-08 ENCOUNTER — Ambulatory Visit (INDEPENDENT_AMBULATORY_CARE_PROVIDER_SITE_OTHER): Payer: Medicare Other | Admitting: Cardiology

## 2020-06-08 ENCOUNTER — Encounter: Payer: Self-pay | Admitting: Cardiology

## 2020-06-08 ENCOUNTER — Other Ambulatory Visit: Payer: Self-pay

## 2020-06-08 VITALS — BP 112/74 | HR 104

## 2020-06-08 DIAGNOSIS — I340 Nonrheumatic mitral (valve) insufficiency: Secondary | ICD-10-CM

## 2020-06-08 DIAGNOSIS — I4891 Unspecified atrial fibrillation: Secondary | ICD-10-CM

## 2020-06-08 DIAGNOSIS — R600 Localized edema: Secondary | ICD-10-CM | POA: Diagnosis not present

## 2020-06-08 DIAGNOSIS — I639 Cerebral infarction, unspecified: Secondary | ICD-10-CM

## 2020-06-08 DIAGNOSIS — I10 Essential (primary) hypertension: Secondary | ICD-10-CM | POA: Diagnosis not present

## 2020-06-08 LAB — HEPATIC FUNCTION PANEL
ALT: 9 (ref 7–35)
AST: 13 (ref 13–35)
Alkaline Phosphatase: 57 (ref 25–125)
Bilirubin, Total: 0.5

## 2020-06-08 LAB — BASIC METABOLIC PANEL
BUN: 28 — AB (ref 4–21)
CO2: 32 — AB (ref 13–22)
Chloride: 100 (ref 99–108)
Creatinine: 0.6 (ref 0.5–1.1)
Glucose: 85
Potassium: 3.8 (ref 3.4–5.3)
Sodium: 142 (ref 137–147)

## 2020-06-08 LAB — CBC AND DIFFERENTIAL
HCT: 36 (ref 36–46)
Hemoglobin: 11 — AB (ref 12.0–16.0)
Platelets: 306 (ref 150–399)
WBC: 8

## 2020-06-08 LAB — COMPREHENSIVE METABOLIC PANEL
Albumin: 3 — AB (ref 3.5–5.0)
Calcium: 8.6 — AB (ref 8.7–10.7)
GFR calc Af Amer: 92
GFR calc non Af Amer: 79
Globulin: 3

## 2020-06-08 LAB — CBC: RBC: 4.26 (ref 3.87–5.11)

## 2020-06-08 NOTE — Patient Instructions (Signed)

## 2020-06-13 ENCOUNTER — Other Ambulatory Visit: Payer: Self-pay | Admitting: Nurse Practitioner

## 2020-06-22 ENCOUNTER — Encounter (HOSPITAL_BASED_OUTPATIENT_CLINIC_OR_DEPARTMENT_OTHER): Payer: Medicare Other | Attending: Internal Medicine | Admitting: Internal Medicine

## 2020-06-22 ENCOUNTER — Encounter: Payer: Self-pay | Admitting: Nurse Practitioner

## 2020-06-22 ENCOUNTER — Non-Acute Institutional Stay (SKILLED_NURSING_FACILITY): Payer: Medicare Other | Admitting: Nurse Practitioner

## 2020-06-22 ENCOUNTER — Other Ambulatory Visit: Payer: Self-pay

## 2020-06-22 DIAGNOSIS — I87312 Chronic venous hypertension (idiopathic) with ulcer of left lower extremity: Secondary | ICD-10-CM | POA: Insufficient documentation

## 2020-06-22 DIAGNOSIS — R634 Abnormal weight loss: Secondary | ICD-10-CM

## 2020-06-22 DIAGNOSIS — L89154 Pressure ulcer of sacral region, stage 4: Secondary | ICD-10-CM | POA: Insufficient documentation

## 2020-06-22 DIAGNOSIS — I4811 Longstanding persistent atrial fibrillation: Secondary | ICD-10-CM | POA: Diagnosis not present

## 2020-06-22 DIAGNOSIS — F5105 Insomnia due to other mental disorder: Secondary | ICD-10-CM

## 2020-06-22 DIAGNOSIS — L97211 Non-pressure chronic ulcer of right calf limited to breakdown of skin: Secondary | ICD-10-CM | POA: Insufficient documentation

## 2020-06-22 DIAGNOSIS — L89899 Pressure ulcer of other site, unspecified stage: Secondary | ICD-10-CM | POA: Insufficient documentation

## 2020-06-22 DIAGNOSIS — K7460 Unspecified cirrhosis of liver: Secondary | ICD-10-CM | POA: Diagnosis not present

## 2020-06-22 DIAGNOSIS — I714 Abdominal aortic aneurysm, without rupture: Secondary | ICD-10-CM | POA: Diagnosis not present

## 2020-06-22 DIAGNOSIS — I4891 Unspecified atrial fibrillation: Secondary | ICD-10-CM | POA: Diagnosis not present

## 2020-06-22 DIAGNOSIS — R609 Edema, unspecified: Secondary | ICD-10-CM

## 2020-06-22 DIAGNOSIS — E876 Hypokalemia: Secondary | ICD-10-CM

## 2020-06-22 DIAGNOSIS — D649 Anemia, unspecified: Secondary | ICD-10-CM

## 2020-06-22 DIAGNOSIS — I639 Cerebral infarction, unspecified: Secondary | ICD-10-CM | POA: Diagnosis not present

## 2020-06-22 DIAGNOSIS — F418 Other specified anxiety disorders: Secondary | ICD-10-CM

## 2020-06-22 NOTE — Assessment & Plan Note (Signed)
05/26/20 K 3.2, will update CMP/eGFR

## 2020-06-22 NOTE — Assessment & Plan Note (Signed)
Sacrococcygeal pressure ulcer, f/u wound care center, covered in dressing during my visit today.

## 2020-06-22 NOTE — Assessment & Plan Note (Signed)
Anemia, takes Fe, Vit B12. Vit B12 311, Hgb 10.6 05/26/20

## 2020-06-22 NOTE — Progress Notes (Signed)
Location:    [place] Room Number: 9 Place of Service:  SNF (31) Provider:  Marlana Latus NP  Syrah Daughtrey X, NP  Patient Care Team: Hallie Ishida X, NP as PCP - General (Internal Medicine) [place] Netta Cedars, MD as Consulting Physician (Orthopedic Surgery) Luberta Mutter, MD as Consulting Physician (Ophthalmology) Sydnee Levans, MD as Consulting Physician (Dermatology) Adrian Prows, MD as Consulting Physician (Cardiology)  Extended Emergency Contact Information Primary Emergency Contact: [facility] Address: [address]          [address] [place] [address] [name] of [place] Phone: [phone] Mobile Phone: [phone] Relation: Daughter Secondary Emergency Contact: Alois, Min[place] Mobile Phone: [phone] Relation: Son  Code Status:  DNR Goals of care: Advanced Directive information Advanced Directives 05/27/2020  Does Patient Have a Medical Advance Directive? Yes  Type of Advance Directive Out of facility DNR (pink MOST or yellow form);Living will;Healthcare Power of Attorney  Does patient want to make changes to medical advance directive? No - Patient declined  Copy of [place] in Chart? Yes - validated most recent copy scanned in chart (See row information)  Would patient like information on creating a medical advance directive? -  Pre-existing out of facility DNR order (yellow form or pink MOST form) Yellow form placed in chart (order not valid for inpatient use)     Chief Complaint  Patient presents with   Medical Management of Chronic Issues   Health Maintenance    Dexa scan, PPSV23, TDAP    HPI:  Pt is a 86 y.o. female seen today for medical management of chronic diseases.    Anemia, takes Fe, Vit B12. Vit B12 311, Hgb 10.6 05/26/20 Afib, takes Metoprolol, Diltiazem, on Eliquis since Ischemic left MCA stroke MRI, in hospital 05/24/20-05/26/20 BLE  edema, persists, takes Torsemide qod Mood/weight, takes Mirtazapine.  Sacrococcygeal pressure ulcer, f/u wound care center, covered in dressing during my visit today.    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 Norris MD   CATARACT EXTRACTION W/ INTRAOCULAR LENS IMPLANT Right 2010   CATARACT EXTRACTION W/ INTRAOCULAR LENS IMPLANT Left 10/2012   DILATION AND CURETTAGE OF UTERUS     EYE SURGERY Right 07/2009   cataract extraction/IOLI [name], MD   KNEE ARTHROSCOPY Right 2008   Torn meniscus, Dr. Veverly Fells    Allergies  Allergen Reactions   Lomotil [Diphenoxylate] Nausea And Vomiting   Sulfa Antibiotics Other (See Comments)    Pt unknown    Amlodipine Swelling    Allergies  as of 06/22/2020      Reactions   Lomotil [diphenoxylate] Nausea And Vomiting   Sulfa Antibiotics Other (See Comments)   Pt unknown    Amlodipine Swelling      Medication List       Accurate as of June 22, 2020 11:59 PM. If you have any questions, ask your  nurse or doctor.        acetaminophen 325 MG tablet Commonly known as: TYLENOL Take 2 tablets (650 mg total) by mouth every 4 (four) hours as needed for mild pain (or temp > 37.5 C (99.5 F)).   apixaban 2.5 MG Tabs tablet Commonly known as: ELIQUIS Take 1 tablet (2.5 mg total) by mouth 2 (two) times daily.   ARGININE PO Take by mouth. Arginaid Extra (whey protein-arginine-c-e-zinc) 6-4.5-250 gram-gram-mg liquid. Twice A Day Between Meals 238m, oral, Twice A Day Between Meals   Artificial Tears 0.1-0.3 % Soln Generic drug: Dextran 70-Hypromellose Apply 1 drop to eye in the morning, at noon, in the evening, and at bedtime. Both eyes   atorvastatin 20 MG tablet Commonly known as: LIPITOR Take 1 tablet (20 mg total) by mouth daily.   diltiazem 120 MG 24 hr capsule Commonly known as: CARDIZEM CD Take 1 capsule (120 mg total) by mouth daily.   feeding supplement (PRO-STAT SUGAR FREE 64) Liqd Take 30 mLs by mouth 2 (two) times daily with a meal.   ferrous sulfate 325 (65 FE) MG tablet Take 325 mg by mouth. Once a day on Sunday, Tuesday, Thursday and Saturday   hydrocerin Crea Apply 1 application topically every other day.   loperamide 2 MG capsule Commonly known as: IMODIUM Take 1 capsule (2 mg total) by mouth every 6 (six) hours as needed for diarrhea or loose stools.   magnesium oxide 400 MG tablet Commonly known as: MAG-OX Take 400 mg by mouth daily.   metoprolol tartrate 50 MG tablet Commonly known as: LOPRESSOR Take 75 mg by mouth 2 (two) times daily.   mirtazapine 7.5 MG tablet Commonly known as: REMERON Take 7.5 mg by mouth every other day.   multivitamin-lutein Caps capsule Take 1 capsule by mouth in the morning and at bedtime.   potassium chloride 10 MEQ tablet Commonly known as: KLOR-CON Take 30 mEq by mouth daily.   saccharomyces boulardii 250 MG capsule Commonly known as: FLORASTOR Take 250 mg by mouth 2 (two) times daily.   torsemide 20 MG  tablet Commonly known as: DEMADEX Take 20 mg by mouth every other day.   triamcinolone cream 0.1 % Commonly known as: KENALOG Apply 1 application topically every other day. Once a day. Apply to legs with esch dressing change.   vitamin B-12 1000 MCG tablet Commonly known as: CYANOCOBALAMIN Take 1,000 mcg by mouth daily.   zinc oxide 20 % ointment Apply 1 application topically as needed for irritation.       Review of Systems  Constitutional: Positive for unexpected weight change. Negative for activity change and fever.       #5Ibs weight down  HENT: Positive for hearing loss. Negative for congestion and trouble swallowing.   Eyes: Negative for visual disturbance.  Respiratory: Negative for cough, shortness of breath and wheezing.        Chronic DOE  Cardiovascular: Positive for leg swelling. Negative for chest pain and palpitations.  Gastrointestinal: Negative for abdominal pain and constipation.  Genitourinary: Negative for dysuria and urgency.  Musculoskeletal: Positive for gait problem.  Skin: Positive for wound. Negative  for color change.  Neurological: Negative for facial asymmetry, speech difficulty, weakness and headaches.  Psychiatric/Behavioral: Negative for confusion and sleep disturbance. The patient is not nervous/anxious.     Immunization History  Administered Date(s) Administered   Fluad Quad(high Dose 65+) 05/26/2020   Influenza Whole 08/21/2010, 05/21/2012   Influenza, High Dose Seasonal PF 05/30/2017, 06/04/2019   Influenza,inj,Quad PF,6+ Mos 05/23/2018   Influenza-Unspecified 06/04/2014, 05/20/2015, 06/08/2016   Moderna SARS-COVID-2 Vaccination 08/25/2019, 09/22/2019   Pneumococcal Conjugate-13 08/22/1999   Td 08/22/1999   Zoster 08/21/2005   Pertinent  Health Maintenance Due  Topic Date Due   DEXA SCAN  Never done   PNA vac Low Risk Adult (2 of 2 - PPSV23) 08/21/2000   INFLUENZA VACCINE  Completed   Fall Risk  07/23/2019 01/15/2019  07/17/2018 06/04/2018 09/12/2017  Falls in the past year? 0 0 0 No No  Number falls in past yr: 0 0 0 - -  Injury with Fall? 0 0 0 - -   Functional Status Survey:    Vitals:   06/22/20 0909  BP: 102/78  Pulse: 99  Resp: 17  Temp: (!) 96.6 F (35.9 C)  SpO2: 97%  Weight: 101 lb 4.8 oz (45.9 kg)  Height: _0  (1.575 m)   Body mass index is 18.53 kg/m. Physical Exam Vitals and nursing note reviewed.  Constitutional:      Appearance: Normal appearance.  HENT:     Head: Normocephalic and atraumatic.     Mouth/Throat:     Mouth: Mucous membranes are moist.  Eyes:     Extraocular Movements: Extraocular movements intact.     Conjunctiva/sclera: Conjunctivae normal.     Pupils: Pupils are equal, round, and reactive to light.     Comments: Crusted eyelashes, left eye low vision  Cardiovascular:     Rate and Rhythm: Tachycardia present. Rhythm irregular.     Heart sounds: No murmur heard.      Comments: Weak DP pulses.  Pulmonary:     Effort: Pulmonary effort is normal.     Breath sounds: No rales.  Abdominal:     General: Bowel sounds are normal.     Palpations: Abdomen is soft.     Tenderness: There is no abdominal tenderness.  Musculoskeletal:     Cervical back: Normal range of motion and neck supple.     Right lower leg: Edema present.     Left lower leg: Edema present.     Comments: 1+ edema BLE, LLE>RLE  Skin:    General: Skin is warm and dry.     Comments: sacral pressure wound(stage III) per reported, the area is covered in dressing during my examination today. Also BLE are wrapped in dressing.    Neurological:     General: No focal deficit present.     Mental Status: She is alert and oriented to person, place, and time. Mental status is at baseline.     Cranial Nerves: Cranial nerve deficit present.     Motor: Weakness present.     Coordination: Coordination abnormal.     Gait: Gait abnormal.     Comments: Mouth is pulled to the left, slurred speech.    Psychiatric:        Mood and Affect: Mood normal.        Behavior: Behavior normal.        Thought Content: Thought content normal.        Judgment: Judgment normal.     Labs reviewed: Recent Labs  01/26/20 0446 01/26/20 0446 01/29/20 0000 03/08/20 0000 03/08/20 0000 04/01/20 0000 05/24/20 1741 05/24/20 1756 05/26/20 0322 05/26/20 0828  NA 130*   < >   < > 137   < > 140 143 142 142  --   K 3.6   < >   < > 4.0   < > 4.3 3.6 3.5 3.2*  --   CL 101   < >   < > 97*   < > 99 102 102 102  --   CO2 22  --    < > 37*   < > 34* 29  --  31  --   GLUCOSE 109*   < >  --   --   --   --  117* 113* 102*  --   BUN 15   < >   < > 24*   < > 25* 26* 29* 20  --   CREATININE 0.66   < >   < > 0.6   < > 0.8 0.68 0.60 0.60  --   CALCIUM 7.7*  --    < > 8.5*   < > 8.5* 8.8*  --  8.9  --   MG 1.8  --   --  1.6  --   --   --   --   --  2.0   < > = values in this interval not displayed.   Recent Labs    01/15/20 0840 01/15/20 0840 01/18/20 1302 02/12/20 0000 03/08/20 0000 04/01/20 0000 05/24/20 1741  AST 12   < > 14*   < > 12* 12* 20  ALT 7   < > 10   < > _0 ALKPHOS  --   --  47   < > 47 54 63  BILITOT 0.7  --  1.0  --   --   --  0.7  PROT 5.9*  --  5.8*  --   --   --  6.3*  ALBUMIN  --   --  3.0*   < > 2.7* 2.7* 2.9*   < > = values in this interval not displayed.   Recent Labs    01/26/20 0446 01/29/20 0000 04/01/20 0000 04/01/20 0000 05/24/20 1741 05/24/20 1756 05/26/20 0823  WBC 10.5   < > 9.3  --  11.7*  --  10.5  NEUTROABS 8.6*   < > 7,719  --  9.5*  --  8.6*  HGB 10.0*   < > 8.4*   < > 10.3* 11.2* 10.6*  HCT 31.6*   < > 27*   < > 35.1* 33.0* 35.1*  MCV 86.8  --   --   --  88.4  --  88.4  PLT 255   < > 296  --  265  --  279   < > = values in this interval not displayed.   Lab Results  Component Value Date   TSH 2.095 05/25/2020   Lab Results  Component Value Date   HGBA1C 5.4 05/25/2020   Lab Results  Component Value Date   CHOL 133 05/25/2020   HDL 39  (L) 05/25/2020   LDLCALC 79 05/25/2020   TRIG 73 05/25/2020   CHOLHDL 3.4 05/25/2020    Significant Diagnostic Results in last 30 days:  CT ANGIO HEAD W OR WO CONTRAST  Result Date: 05/24/2020 CLINICAL DATA:  Left facial droop and slurred speech. Negative acute head CT. EXAM: CT ANGIOGRAPHY HEAD  AND NECK TECHNIQUE: Multidetector CT imaging of the head and neck was performed using the standard protocol during bolus administration of intravenous contrast. Multiplanar CT image reconstructions and MIPs were obtained to evaluate the vascular anatomy. Carotid stenosis measurements (when applicable) are obtained utilizing NASCET criteria, using the distal internal carotid diameter as the denominator. CONTRAST:  17m OMNIPAQUE IOHEXOL 350 MG/ML SOLN COMPARISON:  Head CT same day FINDINGS: CTA NECK FINDINGS Aortic arch: Aortic atherosclerotic calcification. Normal branching pattern without origin stenosis. Right carotid system: Common carotid artery widely patent to the bifurcation region. Calcified plaque at the carotid bifurcation and ICA bulb but no stenosis. Cervical ICA is tortuous but widely patent. Left carotid system: Common carotid artery widely patent to the bifurcation region. Calcified plaque at the carotid bifurcation and ICA bulb. Minimal diameter of the distal ICA bulb measures 3.2 mm. Compared to a more distal cervical ICA diameter of 5 mm, this indicates a 35% stenosis. Vertebral arteries: Both vertebral artery origins are patent. Some calcified plaque in the proximal right vertebral artery but without stenosis greater than 30%. Beyond that, both vertebral arteries widely patent through the cervical region to the foramen magnum. Skeleton: Chronic cervical spondylosis.  No acute finding. Other neck: No soft tissue mass or lymphadenopathy. Upper chest: Negative Review of the MIP images confirms the above findings CTA HEAD FINDINGS Anterior circulation: Both internal carotid arteries widely patent  through the skull base and siphon regions. Ordinary siphon atherosclerotic calcification without stenosis. The anterior and middle cerebral vessels are patent. No large or medium vessel occlusion. No correctable proximal stenosis. Mild atherosclerotic irregularity of the more distal branch vessels. Posterior circulation: Both vertebral arteries widely patent to the basilar. No basilar stenosis. Posterior circulation branch vessels are patent. There is atherosclerotic narrowing and irregularity of the more distal PCA branches. Venous sinuses: Patent and normal. Anatomic variants: None significant. Review of the MIP images confirms the above findings IMPRESSION: 1. No large or medium vessel occlusion. 2. Aortic atherosclerosis. 3. Atherosclerotic disease at both carotid bifurcations. 35% stenosis of the distal ICA bulb on the left. No measurable stenosis on the right. 4. Intracranial atherosclerotic narrowing and irregularity of the more distal branch vessels. 5. These results were communicated to Dr. XErlinda HongAt 5:40 pmon 10/4/2021by text page via the [facility] LLCmessaging system. Aortic Atherosclerosis (ICD10-I70.0). Electronically Signed   By: MNelson ChimesM.D.   On: 05/24/2020 17:40   CT ANGIO NECK W OR WO CONTRAST  Result Date: 05/24/2020 CLINICAL DATA:  Left facial droop and slurred speech. Negative acute head CT. EXAM: CT ANGIOGRAPHY HEAD AND NECK TECHNIQUE: Multidetector CT imaging of the head and neck was performed using the standard protocol during bolus administration of intravenous contrast. Multiplanar CT image reconstructions and MIPs were obtained to evaluate the vascular anatomy. Carotid stenosis measurements (when applicable) are obtained utilizing NASCET criteria, using the distal internal carotid diameter as the denominator. CONTRAST:  524mOMNIPAQUE IOHEXOL 350 MG/ML SOLN COMPARISON:  Head CT same day FINDINGS: CTA NECK FINDINGS Aortic arch: Aortic atherosclerotic calcification. Normal branching pattern  without origin stenosis. Right carotid system: Common carotid artery widely patent to the bifurcation region. Calcified plaque at the carotid bifurcation and ICA bulb but no stenosis. Cervical ICA is tortuous but widely patent. Left carotid system: Common carotid artery widely patent to the bifurcation region. Calcified plaque at the carotid bifurcation and ICA bulb. Minimal diameter of the distal ICA bulb measures 3.2 mm. Compared to a more distal cervical ICA diameter of 5 mm, this  indicates a 35% stenosis. Vertebral arteries: Both vertebral artery origins are patent. Some calcified plaque in the proximal right vertebral artery but without stenosis greater than 30%. Beyond that, both vertebral arteries widely patent through the cervical region to the foramen magnum. Skeleton: Chronic cervical spondylosis.  No acute finding. Other neck: No soft tissue mass or lymphadenopathy. Upper chest: Negative Review of the MIP images confirms the above findings CTA HEAD FINDINGS Anterior circulation: Both internal carotid arteries widely patent through the skull base and siphon regions. Ordinary siphon atherosclerotic calcification without stenosis. The anterior and middle cerebral vessels are patent. No large or medium vessel occlusion. No correctable proximal stenosis. Mild atherosclerotic irregularity of the more distal branch vessels. Posterior circulation: Both vertebral arteries widely patent to the basilar. No basilar stenosis. Posterior circulation branch vessels are patent. There is atherosclerotic narrowing and irregularity of the more distal PCA branches. Venous sinuses: Patent and normal. Anatomic variants: None significant. Review of the MIP images confirms the above findings IMPRESSION: 1. No large or medium vessel occlusion. 2. Aortic atherosclerosis. 3. Atherosclerotic disease at both carotid bifurcations. 35% stenosis of the distal ICA bulb on the left. No measurable stenosis on the right. 4. Intracranial  atherosclerotic narrowing and irregularity of the more distal branch vessels. 5. These results were communicated to Dr. Erlinda Hong At 5:40 pmon 10/4/2021by text page via the [facility] messaging system. Aortic Atherosclerosis (ICD10-I70.0). Electronically Signed   By: Nelson Chimes M.D.   On: 05/24/2020 17:40   MR BRAIN WO CONTRAST  Result Date: 05/25/2020 CLINICAL DATA:  Follow-up examination for acute stroke. EXAM: MRI HEAD WITHOUT CONTRAST TECHNIQUE: Multiplanar, multiecho pulse sequences of the brain and surrounding structures were obtained without intravenous contrast. COMPARISON:  Prior CTs from 05/24/2020. FINDINGS: Brain: Diffuse prominence of the CSF containing spaces compatible with generalized cerebral atrophy. Patchy and confluent T2/FLAIR hyperintensity within the periventricular deep white matter both cerebral hemispheres most compatible chronic microvascular ischemic disease, moderate in nature. Small remote lacunar infarct noted at the left thalamus. Additional small remote left cerebellar infarct noted as well. Patchy restricted diffusion seen involving the cortical and subcortical aspect of the posterior left frontoparietal region, consistent with acute left MCA territory infarct. No associated hemorrhage or mass effect. No other evidence for acute or subacute ischemia. Gray-white matter differentiation otherwise maintained. Few additional punctate chronic micro hemorrhages noted at the left temporal occipital region and left cerebellum. No mass lesion, midline shift or mass effect. No hydrocephalus or extra-axial fluid collection. Pituitary gland and suprasellar region within normal limits. Midline structures intact. Multiple prominent arachnoid pits noted at the occipital calvarium. Vascular: Major intracranial vascular flow voids are maintained. Skull and upper cervical spine: Craniocervical junction within normal limits. Bone marrow signal intensity normal. No scalp soft tissue abnormality.  Sinuses/Orbits: Patient status post bilateral ocular lens replacement. Paranasal sinuses are clear. No mastoid effusion. Other: None. IMPRESSION: 1. Patchy small volume acute ischemic left MCA territory infarct involving the posterior left frontoparietal region. No associated hemorrhage or mass effect. 2. Underlying age-related cerebral atrophy with moderate chronic small vessel ischemic disease, with small remote infarcts involving the left thalamus and left cerebellum. Electronically Signed   By: Jeannine Boga M.D.   On: 05/25/2020 01:34   DG Chest Portable 1 View  Result Date: 05/24/2020 CLINICAL DATA:  Weakness. EXAM: PORTABLE CHEST 1 VIEW COMPARISON:  Jan 19, 2020 FINDINGS: There is cardiomegaly. There is a moderate-sized left-sided pleural effusion. There is a small right-sided pleural effusion. There is no pneumothorax. There is  atelectasis at the lung bases. There are atherosclerotic changes of the thoracic aorta. There is no acute osseous abnormality. IMPRESSION: 1. Cardiomegaly with bilateral pleural effusions, moderate on the left and small on the right. 2. Bibasilar atelectasis. Electronically Signed   By: Constance Holster M.D.   On: 05/24/2020 19:09   ECHOCARDIOGRAM COMPLETE  Result Date: 05/25/2020    ECHOCARDIOGRAM REPORT   Patient Name:   HANI PATNODE Date of Exam: 05/25/2020 Medical Rec #:  [id number]     Height:       62.0 in Accession #:    [id number]    Weight:       106.9 lb Date of Birth:  1922-09-18     BSA:          1.465 m Patient Age:    89 years      BP:           118/73 mmHg Patient Gender: F             HR:           113 bpm. Exam Location:  Inpatient Procedure: 2D Echo, Cardiac Doppler and Color Doppler Indications:    TIA 435.9 / G45.9  History:        Patient has prior history of Echocardiogram examinations, most                 recent 01/22/2020. Signs/Symptoms:Shortness of Breath; Risk                 Factors:Hypertension and Dyslipidemia.  Sonographer:    Bernadene Person RDCS Referring Phys: [place]  1. Left ventricular ejection fraction, by estimation, is 60 to 65%. The left ventricle has normal function. The left ventricle has no regional wall motion abnormalities. Left ventricular diastolic parameters are consistent with Grade II diastolic dysfunction (pseudonormalization). Elevated left ventricular end-diastolic pressure.  2. Right ventricular systolic function is normal. The right ventricular size is normal. There is mildly elevated pulmonary artery systolic pressure.  3. Left atrial size was severely dilated.  4. Right atrial size was mildly dilated.  5. Moderate pleural effusion in the left lateral region.  6. The mitral valve is normal in structure. Mild mitral valve regurgitation. No evidence of mitral stenosis.  7. The aortic valve is normal in structure. Aortic valve regurgitation is trivial. No aortic stenosis is present.  8. The inferior vena cava is normal in size with greater than 50% respiratory variability, suggesting right atrial pressure of 3 mmHg. FINDINGS  Left Ventricle: Left ventricular ejection fraction, by estimation, is 60 to 65%. The left ventricle has normal function. The left ventricle has no regional wall motion abnormalities. The left ventricular internal cavity size was normal in size. There is  no left ventricular hypertrophy. Left ventricular diastolic parameters are consistent with Grade II diastolic dysfunction (pseudonormalization). Elevated left ventricular end-diastolic pressure. Right Ventricle: The right ventricular size is normal. No increase in right ventricular wall thickness. Right ventricular systolic function is normal. There is mildly elevated pulmonary artery systolic pressure. The tricuspid regurgitant velocity is 3.19  m/s, and with an assumed right atrial pressure of 3 mmHg, the estimated right ventricular systolic pressure is 68.1 mmHg. Left Atrium: Left atrial size was severely dilated. Right  Atrium: Right atrial size was mildly dilated. Pericardium: There is no evidence of pericardial effusion. Mitral Valve: The mitral valve is normal in structure. Mild mitral annular calcification. Mild mitral valve regurgitation. No evidence of mitral valve  stenosis. Tricuspid Valve: The tricuspid valve is normal in structure. Tricuspid valve regurgitation is mild . No evidence of tricuspid stenosis. Aortic Valve: The aortic valve is normal in structure. Aortic valve regurgitation is trivial. Aortic regurgitation PHT measures 288 msec. No aortic stenosis is present. Pulmonic Valve: The pulmonic valve was normal in structure. Pulmonic valve regurgitation is not visualized. No evidence of pulmonic stenosis. Aorta: The aortic root is normal in size and structure. Venous: The inferior vena cava is normal in size with greater than 50% respiratory variability, suggesting right atrial pressure of 3 mmHg. IAS/Shunts: No atrial level shunt detected by color flow Doppler. Additional Comments: There is a moderate pleural effusion in the left lateral region.  LEFT VENTRICLE PLAX 2D LVIDd:         3.30 cm     Diastology LVIDs:         2.62 cm     LV e' medial:    6.05 cm/s LV PW:         0.97 cm     LV E/e' medial:  19.7 LV IVS:        0.90 cm     LV e' lateral:   7.36 cm/s LVOT diam:     1.80 cm     LV E/e' lateral: 16.2 LV SV:         40 LV SV Index:   27 LVOT Area:     2.54 cm  LV Volumes (MOD) LV vol d, MOD A2C: 28.7 ml LV vol d, MOD A4C: 32.7 ml LV vol s, MOD A2C: 12.7 ml LV vol s, MOD A4C: 15.8 ml LV SV MOD A2C:     16.0 ml LV SV MOD A4C:     32.7 ml LV SV MOD BP:      16.5 ml RIGHT VENTRICLE RV S prime:     6.84 cm/s TAPSE (M-mode): 1.4 cm LEFT ATRIUM             Index       RIGHT ATRIUM           Index LA diam:        2.50 cm 1.71 cm/m  RA Area:     19.90 cm LA Vol (A2C):   69.5 ml 47.44 ml/m RA Volume:   46.60 ml  31.81 ml/m LA Vol (A4C):   47.8 ml 32.63 ml/m LA Biplane Vol: 59.6 ml 40.68 ml/m  AORTIC VALVE LVOT  Vmax:   90.10 cm/s LVOT Vmean:  56.300 cm/s LVOT VTI:    0.157 m AI PHT:      288 msec  AORTA Ao Root diam: 3.40 cm Ao Asc diam:  3.40 cm MITRAL VALVE                 TRICUSPID VALVE MV Area (PHT): 9.60 cm      TR Peak grad:   40.7 mmHg MV Decel Time: 79 msec       TR Vmax:        319.00 cm/s MR Peak grad:    124.1 mmHg MR Mean grad:    79.0 mmHg   SHUNTS MR Vmax:         557.00 cm/s Systemic VTI:  0.16 m MR Vmean:        423.0 cm/s  Systemic Diam: 1.80 cm MR PISA:         0.39 cm MR PISA Eff ROA: 3 mm MR PISA Radius:  0.25 cm MV E velocity: 119.00  cm/s MV A velocity: 79.90 cm/s MV E/A ratio:  1.49 Skeet Latch MD Electronically signed by Skeet Latch MD Signature Date/Time: 05/25/2020/12:14:19 PM    Final    CT HEAD CODE STROKE WO CONTRAST  Result Date: 05/24/2020 CLINICAL DATA:  Code stroke.  Acute neurological deficit. EXAM: CT HEAD WITHOUT CONTRAST TECHNIQUE: Contiguous axial images were obtained from the base of the skull through the vertex without intravenous contrast. COMPARISON:  None. FINDINGS: Brain: Age related atrophy. Chronic small-vessel ischemic changes throughout the cerebral hemispheric white matter. No sign of acute infarction, mass lesion, hemorrhage, hydrocephalus or extra-axial collection. Vascular: There is atherosclerotic calcification of the major vessels at the base of the brain. Skull: Negative Sinuses/Orbits: Clear/normal Other: None ASPECTS ([place] Stroke Program Early CT Score) - Ganglionic level infarction (caudate, lentiform nuclei, internal capsule, insula, M1-M3 cortex): 7 - Supraganglionic infarction (M4-M6 cortex): 3 Total score (0-10 with 10 being normal): 10 IMPRESSION: 1. No acute finding by CT. Atrophy and chronic small-vessel ischemic changes. 2. ASPECTS is 10. 3. These results were communicated to Dr. Erlinda Hong At 5:23 pmon 10/4/2021by text page via the [facility] messaging system. Electronically Signed   By: Nelson Chimes M.D.   On: 05/24/2020 17:24     Assessment/Plan Atrial fibrillation with rapid ventricular response (HCC) Afib, takes Metoprolol, Diltiazem, on Eliquis since Ischemic left MCA stroke MRI, in hospital 05/24/20-05/26/20   Stroke [facility]) Ischemic left MCA stroke MRI, in hospital 05/24/20-05/26/20, restarted Eliquis  Edema BLE edema, persists, takes Torsemide qod   Insomnia secondary to depression with anxiety Mood/weight, takes Mirtazapine.   Pressure injury of skin Sacrococcygeal pressure ulcer, f/u wound care center, covered in dressing during my visit today.     Anemia Anemia, takes Fe, Vit B12. Vit B12 311, Hgb 10.6 05/26/20   Weight loss About #5Ibs in the past month, taking Mirtazapine, TSH 2.095 05/25/20, will update CBC/diff, CMP/eGFR  Hypokalemia 05/26/20 K 3.2, will update CMP/eGFR    Family/ staff Communication: plan of care reviewed with the patient and charge nurse.   Labs/tests ordered: CBC/diff, CMP/eGFR  Time spend 35 minutes.

## 2020-06-22 NOTE — Assessment & Plan Note (Signed)
About #5Ibs in the past month, taking Mirtazapine, TSH 2.095 05/25/20, will update CBC/diff, CMP/eGFR

## 2020-06-22 NOTE — Assessment & Plan Note (Signed)
BLE edema, persists, takes Torsemide qod

## 2020-06-22 NOTE — Assessment & Plan Note (Signed)
Ischemic left MCA stroke MRI, in hospital 05/24/20-05/26/20, restarted Eliquis

## 2020-06-22 NOTE — Assessment & Plan Note (Signed)
Afib, takes Metoprolol, Diltiazem, on Eliquis since Ischemic left MCA stroke MRI, in hospital 05/24/20-05/26/20

## 2020-06-22 NOTE — Assessment & Plan Note (Addendum)
Mood/weight, takes Mirtazapine 

## 2020-06-23 ENCOUNTER — Encounter: Payer: Self-pay | Admitting: Nurse Practitioner

## 2020-06-23 NOTE — Progress Notes (Signed)
MICHAELINE, Terri Acosta ([id number]) Visit Report for 06/22/2020 Debridement Details Patient Name: Date of Service: [name], [name] IS A. 06/22/2020 9:15 A M Medical Record Number: [id number] Patient Account Number: [id number] Date of Birth/Sex: Treating RN: 03-06-1923 (86 y.o. Orvan Falconer Primary Care Provider: [name], Man Other Clinician: Referring Provider: Treating Provider/Extender: Lesly Rubenstein, Man Weeks in Treatment: 12 Debridement Performed for Assessment: Wound #7 Right,Lateral Lower Leg Performed By: Physician Ricard Dillon., MD Debridement Type: Debridement Level of Consciousness (Pre-procedure): Awake and Alert Pre-procedure Verification/Time Out Yes - 10:29 Taken: Start Time: 10:29 Pain Control: Lidocaine 5% topical ointment T Area Debrided (L x W): otal 1.1 (cm) x 1.2 (cm) = 1.32 (cm) Tissue and other material debrided: Viable, Non-Viable, Slough, Subcutaneous, Skin: Dermis , Skin: Epidermis, Slough Level: Skin/Subcutaneous Tissue Debridement Description: Excisional Instrument: Curette Bleeding: Minimum Hemostasis Achieved: Pressure End Time: 10:33 Procedural Pain: 0 Post Procedural Pain: 0 Response to Treatment: Procedure was tolerated well Level of Consciousness (Post- Awake and Alert procedure): Post Debridement Measurements of Total Wound Length: (cm) 1.1 Width: (cm) 1.2 Depth: (cm) 0.1 Volume: (cm) 0.104 Character of Wound/Ulcer Post Debridement: Improved Post Procedure Diagnosis Same as Pre-procedure Electronic Signature(s) Signed: 06/22/2020 4:57:00 PM By: Linton Ham MD Signed: 06/23/2020 9:08:43 AM By: Carlene Coria RN Entered By: Linton Ham on 06/22/2020 10:53:15 -------------------------------------------------------------------------------- Debridement Details Patient Name: Date of Service: [name], [name] 06/22/2020 9:15 A M Medical Record Number: [id number] Patient Account Number: [id number] Date of Birth/Sex: Treating  RN: November 15, 1922 (86 y.o. Orvan Falconer Primary Care Provider: [name], Man Other Clinician: Referring Provider: Treating Provider/Extender: Lesly Rubenstein, Man Weeks in Treatment: 12 Debridement Performed for Assessment: Wound #9 Left,Lateral Foot Performed By: Physician Ricard Dillon., MD Debridement Type: Debridement Level of Consciousness (Pre-procedure): Awake and Alert Pre-procedure Verification/Time Out Yes - 10:29 Taken: Start Time: 10:29 Pain Control: Lidocaine 5% topical ointment T Area Debrided (L x W): otal 1.7 (cm) x 1.9 (cm) = 3.23 (cm) Tissue and other material debrided: Viable, Non-Viable, Slough, Subcutaneous, Skin: Dermis , Skin: Epidermis, Slough Level: Skin/Subcutaneous Tissue Debridement Description: Excisional Instrument: Curette Bleeding: Minimum Hemostasis Achieved: Pressure End Time: 10:33 Procedural Pain: 0 Post Procedural Pain: 0 Response to Treatment: Procedure was tolerated well Level of Consciousness (Post- Awake and Alert procedure): Post Debridement Measurements of Total Wound Length: (cm) 1.7 Stage: Category/Stage III Width: (cm) 1.9 Depth: (cm) 0.1 Volume: (cm) 0.254 Character of Wound/Ulcer Post Debridement: Improved Post Procedure Diagnosis Same as Pre-procedure Electronic Signature(s) Signed: 06/22/2020 4:57:00 PM By: Linton Ham MD Signed: 06/23/2020 9:08:43 AM By: Carlene Coria RN Entered By: Linton Ham on 06/22/2020 10:53:23 -------------------------------------------------------------------------------- HPI Details Patient Name: Date of Service: [name], [name] 06/22/2020 9:15 A M Medical Record Number: [id number] Patient Account Number: [id number] Date of Birth/Sex: Treating RN: Feb 28, 1923 (86 y.o. Orvan Falconer Primary Care Provider: [name], Man Other Clinician: Referring Provider: Treating Provider/Extender: Lesly Rubenstein, Man Weeks in Treatment: 12 History of Present Illness HPI Description:  86 year old female who was admitted in late May and discharged early June for suspected infectious diarrhea and after undergoing inpatient management with hydration, IV antibiotics, she was released to skilled nursing facility for recovery, during this time she developed a sacral wound that was classified as stage III for which they have been using Santyl and gauze packing with saline with daily changes. She also developed left leg skin tears and then shallow wounds that were addressed with silver alginate dressings. Patient originally is from  assisted living and owing to her acute illness in June was transition to skilled nursing and the intention is for her to go back to assisted living once her recovery is complete. Patient denies any significant pain or discomfort in the sacrum or the leg areas. Intake and appetite have been good, denies any diarrhea or constipation. She denies any fevers or chills. Patient has atrial fibrillation with irregular heart rhythm with frequent episodes of tachycardia in the 100-120 range, she also runs borderline low blood pressures. Other medical problems includes chronic anemia latest hemoglobin 8.3 she is on iron supplements, mild anasarca with cirrhosis identified per CT scan of the abdomen, infrarenal AAA with partial clot 3.5 cm size. ABI in the clinic was difficult to obtain owing to edema in the leg, marked at 0.62, note that CT scan performed in the hospital showed significant atherosclerotic disease in the major arteries 8/16; this is a patient who is at the skilled unit of [facility] although she was in the independent up until May. She was hospitalized and developed a pressure sore on her sacrum. Apparently she also had some abrasions to her left lower leg which was traumatic although I know none of the details. She was noted in this clinic to probably have PAD although this is not been aggressively investigated. We did ask for a x-ray of the lower  sacrum according to the patient who seems cognitively intact that has not been done 8/30; 2-week follow-up. She is [facility] [place]. Pressure sore on her sacrum not much change today although the surface looks somewhat better with the Santyl. We are not doing well with the left leg, part of this is no doubt inadequate compression wrapping a friend's home which is sometimes just over the wound area. She has 2 new areas on the lateral part of the left leg the original wound on the medial part. We finally did get the x-ray of the lower sacrum which was done at the facility that was negative 9/13; 2-week follow-up. Apparently a friend's home cannot handle a wound VAC, which are case manager tried to call them. We are using silver collagen to this area. She has an area also on the left leg but a new area today on the right. 9/27 2-week follow-up. The patient's area on the lower sacrum/coccyx actually looks some better. Healthy looking tissue there is no exposed bone She has areas on the left leg with superficial loss of epithelium. Severely dry scaly skin likely chronic venous insufficiency On the right most of what she has looks like possible skin cancers. 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 She was transitioned back to Eliquis. She has chronic atrial fibrillation. She also apparently had a Mohs surgery procedure on the right leg I believe. She has had some healing of the open wounds on both legs she has 1 on the right lateral leg which may be actually a Mohs surgery site. She has an area on the upper left lateral just below the tibial tuberosity. 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 This is about the same I think in terms of dimensions 11/2 patient on the nursing home part of [place] [facility] [place]. Stage IV sacral wound is actually smaller cleaner does  not probe to bone using silver collagen backing wet-to-dry She has a new presumably pressure area on  the left lateral foot. This required debridement The area on the right lateral lower leg clean base measuring smaller. Raised edges of skin give me the sense of possible underlying skin cancer. I will need to monitor this closely going forward Electronic Signature(s) Signed: 06/22/2020 4:57:00 PM By: Linton Ham MD Entered By: Linton Ham on 06/22/2020 10:51:18 -------------------------------------------------------------------------------- Physical Exam Details Patient Name: Date of Service: [name], [name] 06/22/2020 9:15 A M Medical Record Number: [id number] Patient Account Number: [id number] Date of Birth/Sex: Treating RN: 1922-11-09 (86 y.o. Orvan Falconer Primary Care Provider: [name], Man Other Clinician: Referring Provider: Treating Provider/Extender: Lesly Rubenstein, Man Weeks in Treatment: 12 Constitutional Sitting or standing Blood Pressure is within target range for patient.. Pulse regular and within target range for patient.[name] Respirations regular, non-labored and within target range.. Temperature is normal and within the target range for the patient.[name] Appears in no distress. Notes Wound exam Stage IV pressure ulcer clean surface no palpable bone no surrounding infection I think the undermining is come in somewhat. The area on the left anterior lower leg is healed however she has a new pressure area on the left lateral foot this required debridement with a #5 curette to remove necrotic surface debris The area on the right lateral calf may have been a Mohs procedure I am not sure Electronic Signature(s) Signed: 06/22/2020 4:57:00 PM By: Linton Ham MD Entered By: Linton Ham on 06/22/2020 10:54:46 -------------------------------------------------------------------------------- Physician Orders Details Patient Name: Date of Service: [name], [name]  06/22/2020 9:15 A M Medical Record Number: [id number] Patient Account Number: [id number] Date of Birth/Sex: Treating RN: 1922/11/09 (86 y.o. Orvan Falconer Primary Care Provider: [name], Man Other Clinician: Referring Provider: Treating Provider/Extender: Lesly Rubenstein, Man Weeks in Treatment: 68 Verbal / Phone Orders: No Diagnosis Coding ICD-10 Coding Code Description L97.221 Non-pressure chronic ulcer of left calf limited to breakdown of skin L89.154 Pressure ulcer of sacral region, stage 4 I48.11 Longstanding persistent atrial fibrillation I87.312 Chronic venous hypertension (idiopathic) with ulcer of left lower extremity Follow-up Appointments Return appointment in 3 weeks. Dressing Change Frequency Change Dressing every other day. - all wounds Skin Barriers/Peri-Wound Care Moisturizing lotion - both legs with each dressing change TCA Cream or Ointment - mixed with lotion to both legs Wound Cleansing Clean wound with Normal Saline. - or wound cleanser Primary Wound Dressing Wound #4 Sacrum Silver Collagen - apply saline moistened gauze over collagen Wound #7 Right,Lateral Lower Leg Calcium Alginate with Silver Wound #8 Left,Proximal,Anterior Lower Leg Calcium Alginate with Silver Secondary Dressing Wound #4 Sacrum Foam Border Wound #7 Right,Lateral Lower Leg Dry Gauze Wound #8 Left,Proximal,Anterior Lower Leg Dry Gauze Edema Control Kerlix and Coban - Bilateral - ***WRAP FROM BASE OF TOES TO JUST BELOW BEND OF KNEE*** Avoid standing for long periods of time Elevate legs to the level of the heart or above for 30 minutes daily and/or when sitting, a frequency of: - throughout the day Off-Loading Low air-loss mattress (Group 2) Roho cushion for wheelchair Turn and reposition every 2 hours Electronic Signature(s) Signed: 06/22/2020 4:57:00 PM By: Linton Ham MD Signed: 06/23/2020 9:08:43 AM By: Carlene Coria RN Entered By: Carlene Coria on 06/22/2020  10:00:26 -------------------------------------------------------------------------------- Problem List Details Patient Name: Date of Service: [name], [name] 06/22/2020 9:15 A M Medical Record Number: [id number] Patient Account Number: [id number] Date of Birth/Sex: Treating RN: 13-Nov-1922 (86 y.o. Orvan Falconer Primary Care Provider: [name], Man Other Clinician: Referring Provider: Treating Provider/Extender:  Linton Ham [name], Man Weeks in Treatment: 12 Active Problems ICD-10 Encounter Code Description Active Date MDM Diagnosis L89.154 Pressure ulcer of sacral region, stage 4 04/05/2020 No Yes I48.11 Longstanding persistent atrial fibrillation 03/29/2020 No Yes I87.312 Chronic venous hypertension (idiopathic) with ulcer of left lower extremity 04/19/2020 No Yes L97.522 Non-pressure chronic ulcer of other part of left foot with fat layer exposed 06/22/2020 No Yes L97.211 Non-pressure chronic ulcer of right calf limited to breakdown of skin 06/22/2020 No Yes Inactive Problems ICD-10 Code Description Active Date Inactive Date L97.221 Non-pressure chronic ulcer of left calf limited to breakdown of skin 03/29/2020 03/29/2020 Resolved Problems Electronic Signature(s) Signed: 06/22/2020 4:57:00 PM By: Linton Ham MD Entered By: Linton Ham on 06/22/2020 10:52:54 -------------------------------------------------------------------------------- Progress Note Details Patient Name: Date of Service: [name], [name] 06/22/2020 9:15 A M Medical Record Number: [id number] Patient Account Number: [id number] Date of Birth/Sex: Treating RN: 06/08/1923 (86 y.o. Orvan Falconer Primary Care Provider: [name], Man Other Clinician: Referring Provider: Treating Provider/Extender: Lesly Rubenstein, Man Weeks in Treatment: 12 Subjective History of Present Illness (HPI) 86 year old female who was admitted in late May and discharged early June for suspected infectious diarrhea and after  undergoing inpatient management with hydration, IV antibiotics, she was released to skilled nursing facility for recovery, during this time she developed a sacral wound that was classified as stage III for which they have been using Santyl and gauze packing with saline with daily changes. She also developed left leg skin tears and then shallow wounds that were addressed with silver alginate dressings. Patient originally is from assisted living and owing to her acute illness in June was transition to skilled nursing and the intention is for her to go back to assisted living once her recovery is complete. Patient denies any significant pain or discomfort in the sacrum or the leg areas. Intake and appetite have been good, denies any diarrhea or constipation. She denies any fevers or chills. Patient has atrial fibrillation with irregular heart rhythm with frequent episodes of tachycardia in the 100-120 range, she also runs borderline low blood pressures. Other medical problems includes chronic anemia latest hemoglobin 8.3 she is on iron supplements, mild anasarca with cirrhosis identified per CT scan of the abdomen, infrarenal AAA with partial clot 3.5 cm size. ABI in the clinic was difficult to obtain owing to edema in the leg, marked at 0.62, note that CT scan performed in the hospital showed significant atherosclerotic disease in the major arteries 8/16; this is a patient who is at the skilled unit of [place] although she was in the independent up until May. She was hospitalized and developed a pressure sore on her sacrum. Apparently she also had some abrasions to her left lower leg which was traumatic although I know none of the details. She was noted in this clinic to probably have PAD although this is not been aggressively investigated. We did ask for a x-ray of the lower sacrum according to the patient who seems cognitively intact that has not been done 8/30; 2-week follow-up. She is  still at [facility] [place]. Pressure sore on her sacrum not much change today although the surface looks somewhat better with the Santyl. We are not doing well with the left leg, part of this is no doubt inadequate compression wrapping a friend's home which is sometimes just over the wound area. She has 2 new areas on the lateral part of the left leg the original  wound on the medial part. We finally did get the x-ray of the lower sacrum which was done at the facility that was negative 9/13; 2-week follow-up. Apparently a friend's home cannot handle a wound VAC, which are case manager tried to call them. We are using silver collagen to this area. She has an area also on the left leg but a new area today on the right. 9/27 2-week follow-up. The patient's area on the lower sacrum/coccyx actually looks some better. Healthy looking tissue there is no exposed bone ooShe has areas on the left leg with superficial loss of epithelium. Severely dry scaly skin likely chronic venous insufficiency ooOn the right most of what she has looks like possible skin cancers. 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 She was transitioned back to Eliquis. She has chronic atrial fibrillation. She also apparently had a Mohs surgery procedure on the right leg I believe. She has had some healing of the open wounds on both legs she has 1 on the right lateral leg which may be actually a Mohs surgery site. She has an area on the upper left lateral just below the tibial tuberosity. 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 This is about the same I think in terms of dimensions 11/2 patient on the nursing home part of [place] [facility] [place]. Stage IV sacral wound is actually smaller cleaner does not probe to bone using silver collagen backing wet-to-dry ooShe has a new presumably pressure area on the  left lateral foot. This required debridement ooThe area on the right lateral lower leg clean base measuring smaller. Raised edges of skin give me the sense of possible underlying skin cancer. I will need to monitor this closely going forward Objective Constitutional Sitting or standing Blood Pressure is within target range for patient.. Pulse regular and within target range for patient.[name] Respirations regular, non-labored and within target range.. Temperature is normal and within the target range for the patient.[name] Appears in no distress. Vitals Time Taken: 9:41 AM, Height: 64 in, Source: Stated, Weight: 90 lbs, Source: Stated, BMI: 15.4, Temperature: 98.6 F, Pulse: 111 bpm, Respiratory Rate: 18 breaths/min, Blood Pressure: 123/65 mmHg. General Notes: Wound exam ooStage IV pressure ulcer clean surface no palpable bone no surrounding infection I think the undermining is come in somewhat. ooThe area on the left anterior lower leg is healed however she has a new pressure area on the left lateral foot this required debridement with a #5 curette to remove necrotic surface debris ooThe area on the right lateral calf may have been a Mohs procedure I am not sure Integumentary (Hair, Skin) Wound #4 status is Open. Original cause of wound was Gradually Appeared. The wound is located on the Sacrum. The wound measures 2.9cm length x 1.9cm width x 1cm depth; 4.328cm^2 area and 4.328cm^3 volume. There is Fat Layer (Subcutaneous Tissue) exposed. There is no tunneling noted, however, there is undermining starting at 10:00 and ending at 2:00 with a maximum distance of 1cm. There is a medium amount of serosanguineous drainage noted. The wound margin is well defined and not attached to the wound base. There is large (67-100%) red, pink granulation within the wound bed. There is a small (1-33%) amount of necrotic tissue within the wound bed including Adherent Slough. Wound #7 status is Open. Original cause of  wound was Shear/Friction. The wound is located on the Right,Lateral Lower  Leg. The wound measures 1.1cm length x 1.2cm width x 0.1cm depth; 1.037cm^2 area and 0.104cm^3 volume. There is Fat Layer (Subcutaneous Tissue) exposed. There is no tunneling or undermining noted. There is a medium amount of serosanguineous drainage noted. The wound margin is distinct with the outline attached to the wound base. There is large (67-100%) red, pink granulation within the wound bed. There is no necrotic tissue within the wound bed. Wound #8 status is Healed - Epithelialized. Original cause of wound was Gradually Appeared. The wound is located on the Left,Proximal,Anterior Lower Leg. The wound measures 0cm length x 0cm width x 0cm depth; 0cm^2 area and 0cm^3 volume. There is no tunneling or undermining noted. There is a none present amount of drainage noted. The wound margin is distinct with the outline attached to the wound base. There is no granulation within the wound bed. There is no necrotic tissue within the wound bed. Wound #9 status is Open. Original cause of wound was Pressure Injury. The wound is located on the Left,Lateral Foot. The wound measures 1.7cm length x 1.9cm width x 0.1cm depth; 2.537cm^2 area and 0.254cm^3 volume. There is Fat Layer (Subcutaneous Tissue) exposed. There is no tunneling or undermining noted. There is a medium amount of serosanguineous drainage noted. The wound margin is flat and intact. There is small (1-33%) pink granulation within the wound bed. There is a large (67-100%) amount of necrotic tissue within the wound bed including Adherent Slough. Assessment Active Problems ICD-10 Pressure ulcer of sacral region, stage 4 Longstanding persistent atrial fibrillation Chronic venous hypertension (idiopathic) with ulcer of left lower extremity Non-pressure chronic ulcer of other part of left foot with fat layer exposed Non-pressure chronic ulcer of right calf limited to breakdown  of skin Procedures Wound #7 Pre-procedure diagnosis of Wound #7 is a Skin T located on the Right,Lateral Lower Leg . There was a Excisional Skin/Subcutaneous Tissue Debridement ear with a total area of 1.32 sq cm performed by Ricard Dillon., MD. With the following instrument(s): Curette to remove Viable and Non-Viable tissue/material. Material removed includes Subcutaneous Tissue, Slough, Skin: Dermis, and Skin: Epidermis after achieving pain control using Lidocaine 5% topical ointment. No specimens were taken. A time out was conducted at 10:29, prior to the start of the procedure. A Minimum amount of bleeding was controlled with Pressure. The procedure was tolerated well with a pain level of 0 throughout and a pain level of 0 following the procedure. Post Debridement Measurements: 1.1cm length x 1.2cm width x 0.1cm depth; 0.104cm^3 volume. Character of Wound/Ulcer Post Debridement is improved. Post procedure Diagnosis Wound #7: Same as Pre-Procedure Wound #9 Pre-procedure diagnosis of Wound #9 is a Pressure Ulcer located on the Left,Lateral Foot . There was a Excisional Skin/Subcutaneous Tissue Debridement with a total area of 3.23 sq cm performed by Ricard Dillon., MD. With the following instrument(s): Curette to remove Viable and Non-Viable tissue/material. Material removed includes Subcutaneous Tissue, Slough, Skin: Dermis, and Skin: Epidermis after achieving pain control using Lidocaine 5% topical ointment. No specimens were taken. A time out was conducted at 10:29, prior to the start of the procedure. A Minimum amount of bleeding was controlled with Pressure. The procedure was tolerated well with a pain level of 0 throughout and a pain level of 0 following the procedure. Post Debridement Measurements: 1.7cm length x 1.9cm width x 0.1cm depth; 0.254cm^3 volume. Post debridement Stage noted as Category/Stage III. Character of Wound/Ulcer Post Debridement is improved. Post procedure  Diagnosis Wound #9: Same as  Pre-Procedure Plan Follow-up Appointments: Return appointment in 3 weeks. Dressing Change Frequency: Change Dressing every other day. - all wounds Skin Barriers/Peri-Wound Care: Moisturizing lotion - both legs with each dressing change TCA Cream or Ointment - mixed with lotion to both legs Wound Cleansing: Clean wound with Normal Saline. - or wound cleanser Primary Wound Dressing: Wound #4 Sacrum: Silver Collagen - apply saline moistened gauze over collagen Wound #7 Right,Lateral Lower Leg: Calcium Alginate with Silver Wound #8 Left,Proximal,Anterior Lower Leg: Calcium Alginate with Silver Secondary Dressing: Wound #4 Sacrum: Foam Border Wound #7 Right,Lateral Lower Leg: Dry Gauze Wound #8 Left,Proximal,Anterior Lower Leg: Dry Gauze Edema Control: Kerlix and Coban - Bilateral - ***WRAP FROM BASE OF TOES TO JUST BELOW BEND OF KNEE*** Avoid standing for long periods of time Elevate legs to the level of the heart or above for 30 minutes daily and/or when sitting, a frequency of: - throughout the day Off-Loading: Low air-loss mattress (Group 2) Roho cushion for wheelchair Turn and reposition every 2 hours 1. The sacral wound actually looks some better. Initially a stage IV wound we are using silver collagen backing wet-to-dry . Somewhat unfortunately I do not think a wound VAC could be put in this area 2. The area on the right lateral calf may have been a skin cancer surgery/Mohs issue I am just not sure it has rolled edges I will need to see if I can research this a little more. I was almost thinking it needed to be repeat biopsy 3. New pressure area on the left lateral foot debrided today Electronic Signature(s) Signed: 06/22/2020 4:57:00 PM By: Linton Ham MD Entered By: Linton Ham on 06/22/2020 10:59:11 -------------------------------------------------------------------------------- SuperBill Details Patient Name: Date of Service: HA NSO  N, [name] 06/22/2020 Medical Record Number: [id number] Patient Account Number: [id number] Date of Birth/Sex: Treating RN: [birth year] (86 y.o. 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

## 2020-06-23 NOTE — Progress Notes (Signed)
BEAUTIFUL, PENSYL ([id number]) Visit Report for 06/22/2020 Arrival Information Details Patient Name: Date of Service: [name], [name] IS A. 06/22/2020 9:15 A M Medical Record Number: [id number] Patient Account Number: [id number] Date of Birth/Sex: Treating RN: December 07, 1922 (86 y.o. Terri Acosta, Vaughan Basta Primary Care Coumba Kellison: [name], Man Other Clinician: Referring Marck Mcclenny: Treating Harlan Ervine/Extender: Lesly Rubenstein, Man Weeks in Treatment: 12 Visit Information History Since Last Visit Added or deleted any medications: No Patient Arrived: Wheel Chair Any new allergies or adverse reactions: No Arrival Time: 09:35 Had a fall or experienced change in No Accompanied By: self activities of daily living that may affect Transfer Assistance: None risk of falls: Patient Identification Verified: Yes Signs or symptoms of abuse/neglect since last visito No Secondary Verification Process Completed: Yes Hospitalized since last visit: No Patient Requires Transmission-Based Precautions: No Implantable device outside of the clinic excluding No Patient Has Alerts: No cellular tissue based products placed in the center since last visit: Has Dressing in Place as Prescribed: Yes Pain Present Now: No Electronic Signature(s) Signed: 06/23/2020 5:51:45 PM By: Baruch Gouty RN, BSN Entered By: Baruch Gouty on 06/22/2020 09:40:55 -------------------------------------------------------------------------------- Encounter Discharge Information Details Patient Name: Date of Service: [name], [name] 06/22/2020 9:15 A M Medical Record Number: [id number] Patient Account Number: [id number] Date of Birth/Sex: Treating RN: [birth year] (86 y.o. Terri Acosta Primary Care Willette Mudry: [name], Man Other Clinician: Referring Devlin Brink: Treating Zita Ozimek/Extender: Lesly Rubenstein, Man Weeks in Treatment: 12 Encounter Discharge Information Items Post Procedure Vitals Discharge Condition: Stable Temperature (F):  98.6 Ambulatory Status: Wheelchair Pulse (bpm): 111 Discharge Destination: Home Respiratory Rate (breaths/min): 18 Transportation: Private Auto Blood Pressure (mmHg): 123/65 Accompanied By: self Schedule Follow-up Appointment: Yes Clinical Summary of Care: Electronic Signature(s) Signed: 06/22/2020 4:26:11 PM By: Deon Pilling Entered By: Deon Pilling on 06/22/2020 11:07:17 -------------------------------------------------------------------------------- Lower Extremity Assessment Details Patient Name: Date of Service: [name], [name] 06/22/2020 9:15 A M Medical Record Number: [id number] Patient Account Number: [id number] Date of Birth/Sex: Treating RN: November 13, 1922 (86 y.o. Terri Acosta Primary Care Najwa Spillane: [name], Man Other Clinician: Referring Tamia Dial: Treating Cynthie Garmon/Extender: Lesly Rubenstein, Man Weeks in Treatment: 12 Edema Assessment Assessed: [Left: No] [Right: No] Edema: [Left: No] [Right: No] Calf Left: Right: Point of Measurement: 38 cm From Medial Instep 28.9 cm 26 cm Ankle Left: Right: Point of Measurement: 11 cm From Medial Instep 20.4 cm 19.2 cm Vascular Assessment Pulses: Dorsalis Pedis Palpable: [Left:No] [Right:No] Electronic Signature(s) Signed: 06/23/2020 5:51:45 PM By: Baruch Gouty RN, BSN Entered By: Baruch Gouty on 06/22/2020 09:57:18 -------------------------------------------------------------------------------- Multi Wound Chart Details Patient Name: Date of Service: [name], [name] 06/22/2020 9:15 A M Medical Record Number: [id number] Patient Account Number: [id number] Date of Birth/Sex: Treating RN: 11-11-1922 (86 y.o. Terri Acosta Primary Care Aaliyah Cancro: [name], Man Other Clinician: Referring Bethan Adamek: Treating Riaz Onorato/Extender: Lesly Rubenstein, Man Weeks in Treatment: 12 Vital Signs Height(in): 64 Pulse(bpm): 111 Weight(lbs): 90 Blood Pressure(mmHg): 123/65 Body Mass Index(BMI): 15 Temperature(F):  98.6 Respiratory Rate(breaths/min): 18 Photos: [4:No Photos Sacrum] [7:No Photos Right, Lateral Lower Leg] [8:No Photos Left, Proximal, Anterior Lower Leg] Wound Location: [4:Gradually Appeared] [7:Shear/Friction] [8:Gradually Appeared] Wounding Event: [4:Pressure Ulcer] [7:Skin Tear] [8:Skin Tear] Primary Etiology: [4:Glaucoma, Hypertension] [7:Glaucoma, Hypertension] [8:Glaucoma, Hypertension] Comorbid History: [4:03/29/2020] [7:05/03/2020] [8:05/17/2020] Date Acquired: [4:12] [7:7] [8:5] Weeks of Treatment: [4:Open] [7:Open] [8:Healed - Epithelialized] Wound Status: [4:2.9x1.9x1] [7:1.1x1.2x0.1] [8:0x0x0] Measurements L x W x D (cm) [4:4.328] [7:1.037] [8:0] A (cm) : rea [4:4.328] [7:0.104] [8:0] Volume (cm) : [  4:26.50%] [7:-10.10%] [8:100.00%] % Reduction in A rea: [4:59.20%] [7:44.70%] [8:100.00%] % Reduction in Volume: [4:10] Starting Position 1 (o'clock): [4:2] Ending Position 1 (o'clock): [4:1] Maximum Distance 1 (cm): [4:Yes] [7:No] [8:No] Undermining: [4:Category/Stage IV] [7:Full Thickness Without Exposed] [8:Full Thickness Without Exposed] Classification: [4:Medium] [7:Support Structures Medium] [8:Support Structures None Present] Exudate A mount: [4:Serosanguineous] [7:Serosanguineous] [8:N/A] Exudate Type: [4:red, brown] [7:red, brown] [8:N/A] Exudate Color: [4:Well defined, not attached] [7:Distinct, outline attached] [8:Distinct, outline attached] Wound Margin: [4:Large (67-100%)] [7:Large (67-100%)] [8:None Present (0%)] Granulation A mount: [4:Red, Pink] [7:Red, Pink] [8:N/A] Granulation Quality: [4:Small (1-33%)] [7:None Present (0%)] [8:None Present (0%)] Necrotic A mount: [4:Fat Layer (Subcutaneous Tissue): Yes Fat Layer (Subcutaneous Tissue): Yes Fascia: No] Exposed Structures: [4:Fascia: No Tendon: No Muscle: No Joint: No Bone: No None] [7:Fascia: No Tendon: No Muscle: No Joint: No Bone: No Small (1-33%)] [8:Fat Layer (Subcutaneous Tissue): No Tendon: No Muscle: No  Joint: No Bone: No Large (67-100%)] Epithelialization: [4:N/A] [7:Debridement - Excisional] [8:N/A] Debridement: Pre-procedure Verification/Time Out N/A [7:10:29] [8:N/A] Taken: [4:N/A] [7:Lidocaine 5% topical ointment] [8:N/A] Pain Control: [4:N/A] [7:Subcutaneous, Slough] [8:N/A] Tissue Debrided: [4:N/A] [7:Skin/Subcutaneous Tissue] [8:N/A] Level: [4:N/A] [7:1.32] [8:N/A] Debridement A (sq cm): [4:rea N/A] [7:Curette] [8:N/A] Instrument: [4:N/A] [7:Minimum] [8:N/A] Bleeding: [4:N/A] [7:Pressure] [8:N/A] Hemostasis A chieved: [4:N/A] [7:0] [8:N/A] Procedural Pain: [4:N/A] [7:0] [8:N/A] Post Procedural Pain: [4:N/A] [7:Procedure was tolerated well] [8:N/A] Debridement Treatment Response: [4:N/A] [7:1.1x1.2x0.1] [8:N/A] Post Debridement Measurements L x W x D (cm) [4:N/A] [7:0.104] [8:N/A] Post Debridement Volume: (cm) [4:N/A] [7:N/A] [8:N/A] Post Debridement Stage: [4:N/A] [7:Debridement] [8:N/A] Wound Number: 9 N/A N/A Photos: No Photos N/A N/A Left, Lateral Foot N/A N/A Wound Location: Pressure Injury N/A N/A Wounding Event: Pressure Ulcer N/A N/A Primary Etiology: Glaucoma, Hypertension N/A N/A Comorbid History: 06/14/2020 N/A N/A Date Acquired: 0 N/A N/A Weeks of Treatment: Open N/A N/A Wound Status: 1.7x1.9x0.1 N/A N/A Measurements L x W x D (cm) 2.537 N/A N/A A (cm) : rea 0.254 N/A N/A Volume (cm) : N/A N/A N/A % Reduction in A rea: N/A N/A N/A % Reduction in Volume: No N/A N/A Undermining: Category/Stage III N/A N/A Classification: Medium N/A N/A Exudate A mount: Serosanguineous N/A N/A Exudate Type: red, brown N/A N/A Exudate Color: Flat and Intact N/A N/A Wound Margin: Small (1-33%) N/A N/A Granulation A mount: Pink N/A N/A Granulation Quality: Large (67-100%) N/A N/A Necrotic A mount: Fat Layer (Subcutaneous Tissue): Yes N/A N/A Exposed Structures: Fascia: No Tendon: No Muscle: No Joint: No Bone: No Small (1-33%) N/A  N/A Epithelialization: Debridement - Excisional N/A N/A Debridement: Pre-procedure Verification/Time Out 10:29 N/A N/A Taken: Lidocaine 5% topical ointment N/A N/A Pain Control: Subcutaneous, Slough N/A N/A Tissue Debrided: Skin/Subcutaneous Tissue N/A N/A Level: 3.23 N/A N/A Debridement A (sq cm): rea Curette N/A N/A Instrument: Minimum N/A N/A Bleeding: Pressure N/A N/A Hemostasis A chieved: 0 N/A N/A Procedural Pain: 0 N/A N/A Post Procedural Pain: Procedure was tolerated well N/A N/A Debridement Treatment Response: 1.7x1.9x0.1 N/A N/A Post Debridement Measurements L x W x D (cm) 0.254 N/A N/A Post Debridement Volume: (cm) Category/Stage III N/A N/A Post Debridement Stage: Debridement N/A N/A Procedures Performed: Treatment Notes Electronic Signature(s) Signed: 06/22/2020 4:57:00 PM By: Linton Ham MD Signed: 06/23/2020 9:08:43 AM By: Carlene Coria RN Entered By: Linton Ham on 06/22/2020 10:53:01 -------------------------------------------------------------------------------- Multi-Disciplinary Care Plan Details Patient Name: Date of Service: [name], [name] 06/22/2020 9:15 A M Medical Record Number: [id number] Patient Account Number: [id number] Date of Birth/Sex: Treating RN: 1923/04/29 (86 y.o. F) [name],  [facility] Primary Care Naythan Douthit: [name], Man Other Clinician: Referring Amar Keenum: Treating Brinly Maietta/Extender: Lesly Rubenstein, Man Weeks in Treatment: 12 Active Inactive Wound/Skin Impairment Nursing Diagnoses: Impaired tissue integrity Knowledge deficit related to ulceration/compromised skin integrity Goals: Patient/caregiver will verbalize understanding of skin care regimen Date Initiated: 03/29/2020 Target Resolution Date: 07/05/2020 Goal Status: Active Interventions: Assess patient/caregiver ability to obtain necessary supplies Assess patient/caregiver ability to perform ulcer/skin care regimen upon admission and as needed Assess  ulceration(s) every visit Provide education on ulcer and skin care Notes: Electronic Signature(s) Signed: 06/23/2020 9:08:43 AM By: Carlene Coria RN Entered By: Carlene Coria on 06/22/2020 10:00:49 -------------------------------------------------------------------------------- Pain Assessment Details Patient Name: Date of Service: [name], [name] 06/22/2020 9:15 A M Medical Record Number: [id number] Patient Account Number: [id number] Date of Birth/Sex: Treating RN: 1923-02-25 (86 y.o. Terri Acosta Primary Care Guerin Lashomb: [name], Man Other Clinician: Referring Jeffey Janssen: Treating Sheffield Hawker/Extender: Lesly Rubenstein, Man Weeks in Treatment: 12 Active Problems Location of Pain Severity and Description of Pain Patient Has Paino No Site Locations Rate the pain. Current Pain Level: 0 Pain Management and Medication Current Pain Management: Electronic Signature(s) Signed: 06/23/2020 5:51:45 PM By: Baruch Gouty RN, BSN Entered By: Baruch Gouty on 06/22/2020 09:41:49 -------------------------------------------------------------------------------- Patient/Caregiver Education Details Patient Name: Date of Service: HA NSO Delane Ginger, [name] 11/2/2021andnbsp9:15 A M Medical Record Number: [id number] Patient Account Number: [id number] Date of Birth/Gender: Treating RN: 1923-08-12 (86 y.o. Terri Acosta Primary Care Physician: [name], Man Other Clinician: Referring Physician: Treating Physician/Extender: Lesly Rubenstein, Man Weeks in Treatment: 12 Education Assessment Education Provided To: Patient Education Topics Provided Wound/Skin Impairment: Methods: Explain/Verbal Responses: State content correctly Electronic Signature(s) Signed: 06/23/2020 9:08:43 AM By: Carlene Coria RN Entered By: Carlene Coria on 06/22/2020 10:01:15 -------------------------------------------------------------------------------- Wound Assessment Details Patient Name: Date of Service: [name],  [name] 06/22/2020 9:15 A M Medical Record Number: [id number] Patient Account Number: [id number] Date of Birth/Sex: Treating RN: 08/12/1923 (86 y.o. Terri Acosta Primary Care Macguire Holsinger: [name], Man Other Clinician: Referring Lalita Ebel: Treating Aydien Majette/Extender: Lesly Rubenstein, Man Weeks in Treatment: 12 Wound Status Wound Number: 4 Primary Etiology: Pressure Ulcer Wound Location: Sacrum Wound Status: Open Wounding Event: Gradually Appeared Comorbid History: Glaucoma, Hypertension Date Acquired: 03/29/2020 Weeks Of Treatment: 12 Clustered Wound: No Wound Measurements Length: (cm) 2.9 Width: (cm) 1.9 Depth: (cm) 1 Area: (cm) 4.328 Volume: (cm) 4.328 % Reduction in Area: 26.5% % Reduction in Volume: 59.2% Epithelialization: None Tunneling: No Undermining: Yes Starting Position (o'clock): 10 Ending Position (o'clock): 2 Maximum Distance: (cm) 1 Wound Description Classification: Category/Stage IV Wound Margin: Well defined, not attached Exudate Amount: Medium Exudate Type: Serosanguineous Exudate Color: red, brown Foul Odor After Cleansing: No Slough/Fibrino No Wound Bed Granulation Amount: Large (67-100%) Exposed Structure Granulation Quality: Red, Pink Fascia Exposed: No Necrotic Amount: Small (1-33%) Fat Layer (Subcutaneous Tissue) Exposed: Yes Necrotic Quality: Adherent Slough Tendon Exposed: No Muscle Exposed: No Joint Exposed: No Bone Exposed: No Treatment Notes Wound #4 (Sacrum) 1. Cleanse With Wound Cleanser 2. Periwound Care Skin Prep 3. Primary Dressing Applied Collegen AG Hydrogel or K-Y Jelly Other primary dressing (specifiy in notes) 4. Secondary Dressing Foam Border Dressing 5. Secured With Self Adhesive Bandage Notes saline moistened gauze backing collagen. Electronic Signature(s) Signed: 06/23/2020 5:51:45 PM By: Baruch Gouty RN, BSN Entered By: Baruch Gouty on 06/22/2020  10:10:14 -------------------------------------------------------------------------------- Wound Assessment Details Patient Name: Date of Service: [name], [name] 06/22/2020 9:15 A M Medical Record Number: [id number] Patient Account Number:  [id number] Date of Birth/Sex: Treating RN: [birth year] (86 y.o. Terri Acosta Primary Care Wilmarie Sparlin: [name], Man Other Clinician: Referring Annalisse Minkoff: Treating Elira Colasanti/Extender: Lesly Rubenstein, Man Weeks in Treatment: 12 Wound Status Wound Number: 7 Primary Etiology: Skin Tear Wound Location: Right, Lateral Lower Leg Wound Status: Open Wounding Event: Shear/Friction Comorbid History: Glaucoma, Hypertension Date Acquired: 05/03/2020 Weeks Of Treatment: 7 Clustered Wound: No Wound Measurements Length: (cm) 1.1 Width: (cm) 1.2 Depth: (cm) 0.1 Area: (cm) 1.037 Volume: (cm) 0.104 % Reduction in Area: -10.1% % Reduction in Volume: 44.7% Epithelialization: Small (1-33%) Tunneling: No Undermining: No Wound Description Classification: Full Thickness Without Exposed Support Structures Wound Margin: Distinct, outline attached Exudate Amount: Medium Exudate Type: Serosanguineous Exudate Color: red, brown Foul Odor After Cleansing: No Slough/Fibrino No Wound Bed Granulation Amount: Large (67-100%) Exposed Structure Granulation Quality: Red, Pink Fascia Exposed: No Necrotic Amount: None Present (0%) Fat Layer (Subcutaneous Tissue) Exposed: Yes Tendon Exposed: No Muscle Exposed: No Joint Exposed: No Bone Exposed: No Treatment Notes Wound #7 (Right, Lateral Lower Leg) 1. Cleanse With Wound Cleanser Soap and water 2. Periwound Care Moisturizing lotion 3. Primary Dressing Applied Calcium Alginate Ag 4. Secondary Dressing Dry Gauze 6. Support Layer Applied Kerlix/Coban Notes netting. Electronic Signature(s) Signed: 06/23/2020 5:51:45 PM By: Baruch Gouty RN, BSN Entered By: Baruch Gouty on 06/22/2020  10:10:35 -------------------------------------------------------------------------------- Wound Assessment Details Patient Name: Date of Service: [name], [name] 06/22/2020 9:15 A M Medical Record Number: [id number] Patient Account Number: [id number] Date of Birth/Sex: Treating RN: 11-Oct-1922 (86 y.o. Terri Acosta Primary Care Jani Moronta: [name], Man Other Clinician: Referring Andi Mahaffy: Treating Leighla Chestnutt/Extender: Lesly Rubenstein, Man Weeks in Treatment: 12 Wound Status Wound Number: 8 Primary Etiology: Skin Tear Wound Location: Left, Proximal, Anterior Lower Leg Wound Status: Healed - Epithelialized Wounding Event: Gradually Appeared Comorbid History: Glaucoma, Hypertension Date Acquired: 05/17/2020 Weeks Of Treatment: 5 Clustered Wound: No Wound Measurements Length: (cm) Width: (cm) Depth: (cm) Area: (cm) Volume: (cm) 0 % Reduction in Area: 100% 0 % Reduction in Volume: 100% 0 Epithelialization: Large (67-100%) 0 Tunneling: No 0 Undermining: No Wound Description Classification: Full Thickness Without Exposed Support Structures Wound Margin: Distinct, outline attached Exudate Amount: None Present Foul Odor After Cleansing: No Slough/Fibrino No Wound Bed Granulation Amount: None Present (0%) Exposed Structure Necrotic Amount: None Present (0%) Fascia Exposed: No Fat Layer (Subcutaneous Tissue) Exposed: No Tendon Exposed: No Muscle Exposed: No Joint Exposed: No Bone Exposed: No Electronic Signature(s) Signed: 06/23/2020 5:51:45 PM By: Baruch Gouty RN, BSN Entered By: Baruch Gouty on 06/22/2020 10:10:58 -------------------------------------------------------------------------------- Wound Assessment Details Patient Name: Date of Service: [name], [name] 06/22/2020 9:15 A M Medical Record Number: [id number] Patient Account Number: [id number] Date of Birth/Sex: Treating RN: 19-Apr-1923 (86 y.o. Terri Acosta Primary Care Brayn Eckstein: [name],  Man Other Clinician: Referring Jedadiah Abdallah: Treating Kristel Durkee/Extender: Lesly Rubenstein, Man Weeks in Treatment: 12 Wound Status Wound Number: 9 Primary Etiology: Pressure Ulcer Wound Location: Left, Lateral Foot Wound Status: Open Wounding Event: Pressure Injury Comorbid History: Glaucoma, Hypertension Date Acquired: 06/14/2020 Weeks Of Treatment: 0 Clustered Wound: No Wound Measurements Length: (cm) 1.7 Width: (cm) 1.9 Depth: (cm) 0.1 Area: (cm) 2.537 Volume: (cm) 0.254 % Reduction in Area: % Reduction in Volume: Epithelialization: Small (1-33%) Tunneling: No Undermining: No Wound Description Classification: Category/Stage III Wound Margin: Flat and Intact Exudate Amount: Medium Exudate Type: Serosanguineous Exudate Color: red, brown Foul Odor After Cleansing: No Slough/Fibrino Yes Wound Bed Granulation Amount: Small (1-33%) Exposed Structure Granulation Quality: Pink Fascia  Exposed: No Necrotic Amount: Large (67-100%) Fat Layer (Subcutaneous Tissue) Exposed: Yes Necrotic Quality: Adherent Slough Tendon Exposed: No Muscle Exposed: No Joint Exposed: No Bone Exposed: No Electronic Signature(s) Signed: 06/23/2020 5:51:45 PM By: Baruch Gouty RN, BSN Entered By: Baruch Gouty on 06/22/2020 10:09:11 -------------------------------------------------------------------------------- [place] Details Patient Name: Date of Service: [name], [name] 06/22/2020 9:15 A M Medical Record Number: [id number] Patient Account Number: [id number] Date of Birth/Sex: Treating RN: April 17, 1923 (86 y.o. Terri Acosta Primary Care Shawndell Schillaci: [name], Man Other Clinician: Referring Emonii Wienke: Treating Bram Hottel/Extender: Lesly Rubenstein, Man Weeks in Treatment: 12 Vital Signs Time Taken: 09:41 Temperature (F): 98.6 Height (in): 64 Pulse (bpm): 111 Source: Stated Respiratory Rate (breaths/min): 18 Weight (lbs): 90 Blood Pressure (mmHg): 123/65 Source:  Stated Reference Range: 80 - 120 mg / dl Body Mass Index (BMI): 15.4 Electronic Signature(s) Signed: 06/23/2020 5:51:45 PM By: Baruch Gouty RN, BSN Entered By: Baruch Gouty on 06/22/2020 09:41:35

## 2020-06-25 LAB — HEPATIC FUNCTION PANEL
ALT: 7 (ref 7–35)
AST: 13 (ref 13–35)
Alkaline Phosphatase: 51 (ref 25–125)
Bilirubin, Total: 0.4

## 2020-06-25 LAB — CBC AND DIFFERENTIAL
HCT: 32 — AB (ref 36–46)
Hemoglobin: 9.8 — AB (ref 12.0–16.0)
Neutrophils Absolute: 4749
Platelets: 301 (ref 150–399)
WBC: 6.4

## 2020-06-25 LAB — BASIC METABOLIC PANEL
BUN: 30 — AB (ref 4–21)
CO2: 31 — AB (ref 13–22)
Chloride: 100 (ref 99–108)
Creatinine: 0.6 (ref 0.5–1.1)
Glucose: 89
Potassium: 3.6 (ref 3.4–5.3)
Sodium: 141 (ref 137–147)

## 2020-06-25 LAB — COMPREHENSIVE METABOLIC PANEL
Albumin: 2.9 — AB (ref 3.5–5.0)
Calcium: 8.7 (ref 8.7–10.7)
GFR calc Af Amer: 90
GFR calc non Af Amer: 78
Globulin: 2.7

## 2020-06-25 LAB — CBC: RBC: 3.84 — AB (ref 3.87–5.11)

## 2020-06-28 ENCOUNTER — Ambulatory Visit (INDEPENDENT_AMBULATORY_CARE_PROVIDER_SITE_OTHER): Payer: Medicare Other | Admitting: Adult Health

## 2020-06-28 ENCOUNTER — Encounter: Payer: Self-pay | Admitting: Adult Health

## 2020-06-28 VITALS — BP 111/77 | HR 75 | Ht 62.0 in | Wt 99.0 lb

## 2020-06-28 DIAGNOSIS — E782 Mixed hyperlipidemia: Secondary | ICD-10-CM

## 2020-06-28 DIAGNOSIS — I63412 Cerebral infarction due to embolism of left middle cerebral artery: Secondary | ICD-10-CM

## 2020-06-28 DIAGNOSIS — I10 Essential (primary) hypertension: Secondary | ICD-10-CM

## 2020-06-28 DIAGNOSIS — I4811 Longstanding persistent atrial fibrillation: Secondary | ICD-10-CM

## 2020-06-28 NOTE — Patient Instructions (Addendum)
Continue Eliquis (apixaban) daily  and atorvastatin for secondary stroke prevention  Continue to follow with cardiology for atrial fibrillation and Eliquis management  Continue to follow up with PCP regarding cholesterol and blood pressure management  Maintain strict control of hypertension with blood pressure goal below 130/90 and cholesterol with LDL cholesterol (bad cholesterol) goal below 70 mg/dL.       Followup in the future with me in 4 months or call earlier if needed       Thank you for coming to see [place] at [facility]. I hope we have been able to provide you high quality care today.  You may receive a patient satisfaction survey over the next few weeks. We would appreciate your feedback and comments so that we may continue to improve ourselves and the health of our patients.     Stroke Prevention Some medical conditions and behaviors are associated with a higher chance of having a stroke. You can help prevent a stroke by making nutrition, lifestyle, and other changes, including managing any medical conditions you may have. What nutrition changes can be made?   Eat healthy foods. You can do this by: ? Choosing foods high in fiber, such as fresh fruits and vegetables and whole grains. ? Eating at least 5 or more servings of fruits and vegetables a day. Try to fill half of your plate at each meal with fruits and vegetables. ? Choosing lean protein foods, such as lean cuts of meat, poultry without skin, fish, tofu, beans, and nuts. ? Eating low-fat dairy products. ? Avoiding foods that are high in salt (sodium). This can help lower blood pressure. ? Avoiding foods that have saturated fat, trans fat, and cholesterol. This can help prevent high cholesterol. ? Avoiding processed and premade foods.  Follow your health care provider's specific guidelines for losing weight, controlling high blood pressure (hypertension), lowering high cholesterol, and managing  diabetes. These may include: ? Reducing your daily calorie intake. ? Limiting your daily sodium intake to 1,500 milligrams (mg). ? Using only healthy fats for cooking, such as olive oil, canola oil, or sunflower oil. ? Counting your daily carbohydrate intake. What lifestyle changes can be made?  Maintain a healthy weight. Talk to your health care provider about your ideal weight.  Get at least 30 minutes of moderate physical activity at least 5 days a week. Moderate activity includes brisk walking, biking, and swimming.  Do not use any products that contain nicotine or tobacco, such as cigarettes and e-cigarettes. If you need help quitting, ask your health care provider. It may also be helpful to avoid exposure to secondhand smoke.  Limit alcohol intake to no more than 1 drink a day for nonpregnant women and 2 drinks a day for men. One drink equals 12 oz of beer, 5 oz of wine, or 1 oz of hard liquor.  Stop any illegal drug use.  Avoid taking birth control pills. Talk to your health care provider about the risks of taking birth control pills if: ? You are over 54 years old. ? You smoke. ? You get migraines. ? You have ever had a blood clot. What other changes can be made?  Manage your cholesterol levels. ? Eating a healthy diet is important for preventing high cholesterol. If cholesterol cannot be managed through diet alone, you may also need to take medicines. ? Take any prescribed medicines to control your cholesterol as told by your health care provider.  Manage your diabetes. ? Eating a  healthy diet and exercising regularly are important parts of managing your blood sugar. If your blood sugar cannot be managed through diet and exercise, you may need to take medicines. ? Take any prescribed medicines to control your diabetes as told by your health care provider.  Control your hypertension. ? To reduce your risk of stroke, try to keep your blood pressure below 130/80. ? Eating a  healthy diet and exercising regularly are an important part of controlling your blood pressure. If your blood pressure cannot be managed through diet and exercise, you may need to take medicines. ? Take any prescribed medicines to control hypertension as told by your health care provider. ? Ask your health care provider if you should monitor your blood pressure at home. ? Have your blood pressure checked every year, even if your blood pressure is normal. Blood pressure increases with age and some medical conditions.  Get evaluated for sleep disorders (sleep apnea). Talk to your health care provider about getting a sleep evaluation if you snore a lot or have excessive sleepiness.  Take over-the-counter and prescription medicines only as told by your health care provider. Aspirin or blood thinners (antiplatelets or anticoagulants) may be recommended to reduce your risk of forming blood clots that can lead to stroke.  Make sure that any other medical conditions you have, such as atrial fibrillation or atherosclerosis, are managed. What are the warning signs of a stroke? The warning signs of a stroke can be easily remembered as BEFAST.  B is for balance. Signs include: ? Dizziness. ? Loss of balance or coordination. ? Sudden trouble walking.  E is for eyes. Signs include: ? A sudden change in vision. ? Trouble seeing.  F is for face. Signs include: ? Sudden weakness or numbness of the face. ? The face or eyelid drooping to one side.  A is for arms. Signs include: ? Sudden weakness or numbness of the arm, usually on one side of the body.  S is for speech. Signs include: ? Trouble speaking (aphasia). ? Trouble understanding.  T is for time. ? These symptoms may represent a serious problem that is an emergency. Do not wait to see if the symptoms will go away. Get medical help right away. Call your local emergency services (911 in the [place] Do not drive yourself to the hospital.  Other  signs of stroke may include: ? A sudden, severe headache with no known cause. ? Nausea or vomiting. ? Seizure. Where to find more information For more information, visit:  American Stroke Association: www.strokeassociation.org  National Stroke Association: www.stroke.org Summary  You can prevent a stroke by eating healthy, exercising, not smoking, limiting alcohol intake, and managing any medical conditions you may have.  Do not use any products that contain nicotine or tobacco, such as cigarettes and e-cigarettes. If you need help quitting, ask your health care provider. It may also be helpful to avoid exposure to secondhand smoke.  Remember BEFAST for warning signs of stroke. Get help right away if you or a loved one has any of these signs. This information is not intended to replace advice given to you by your health care provider. Make sure you discuss any questions you have with your health care provider. Document Revised: 07/20/2017 Document Reviewed: 09/12/2016 Elsevier Patient Education  2020 [employer].

## 2020-06-28 NOTE — Progress Notes (Signed)
[facility] [address] [place]. [place] [address] [phone]       [place] Terri Acosta Date of Birth:  [birth year] Medical Record Number:  [id number]   Reason for Referral:  hospital stroke follow up    SUBJECTIVE:   CHIEF COMPLAINT:  Chief Complaint  Patient presents with  . Hospitalization Follow-up    rm 9, alone pt states she is feeling well     HPI:   Terri Acosta is a 86 y.o. female with history of Afib RVR off eliquis, HTN, asthma, living in ALF admitted for right facial droop and speech difficulty on 05/24/2020.  Personally reviewed hospitalization pertinent progress notes, lab work and imaging with summary provided.  Evaluated by Dr. Erlinda Hong with stroke work-up revealing left MCA frontal patchy infarcts, embolic secondary to A. fib not on AC. Per Dr. Phoebe Sharps note, pharmacy reported patient stopped Eliquis due to fear of falls however patient denied stopping any medications by herself.  She agreed to resume Eliquis and Eliquis restarted for secondary stroke prevention and chronic A. Fib. Hx of HTN stable on Cardizem and metoprolol with long-term BP goal normotensive range.  LDL 79 and initiated atorvastatin 20 mg daily.  No history or evidence of DM with A1c 5.4.  Other stroke risk factors include advanced age but no prior stroke history.  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  CT head and neck no LVO, intracranial atherosclerosis  MRI left MCA frontoparietal infarct  2D Echo EF 60 to 65%.  LA severely dilated  LDL 79  HgbA1c 5.4  SCDs for VTE prophylaxis  No antithrombotic prior to admission, now on aspirin 325 mg daily.  Patient agreeable with resume Eliquis.  Recommend to resume Eliquis tomorrow evening.  Patient counseled to be compliant with her antithrombotic medications  Ongoing aggressive stroke risk factor management  Therapy recommendations:  Home health PT OT  Disposition:  Return to ALF with [facility] therapies   Today, 06/28/2020, Ms. [name] is being seen for hospital follow-up unaccompanied.  She reports [facility].  She has been doing well since discharge and denies any residual deficits.  Denies new or reoccurring stroke/TIA symptoms.  She has continued to work with PT due to limited ambulation with right knee "busted" and left hip "out of socket".  She does ambulate short distances with PT otherwise transfers via wheelchair.  Remains on Eliquis 2.5 mg twice daily without bleeding or bruising.  Remains on atorvastatin 20 mg daily.  Blood pressure today 111/77.  No concerns at this time.    ROS:   14 system review of systems performed and negative with exception of joint pain  PMH:  Past Medical History:  Diagnosis Date  . Abnormal uterine bleeding   . Actinic keratosis 02/13/2012  . Acute bronchospasm 10/31/2011  . Cancer ([facility]) 04/2008   Skin cancer of low back [name], MD  . Cataract   . Disorder of bone and cartilage, unspecified 02/18/2007  . Dizziness and giddiness 08/30/2010  . Dysmenorrhea   . Edema 12/19/2011  . Endometriosis   . 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 Osteoarthrosis, unspecified whether generalized or localized, unspecified site 08/20/2006  . Osteoporosis   . Other abnormal blood chemistry 03/14/2011  . Other and unspecified hyperlipidemia 10/18/2010  . Palpitations 02/13/2012  . Postmenopausal bleeding 08/30/2010  . Shortness of  breath 11/28/2011  . Supraventricular premature beats 05/23/2011  . Unspecified essential hypertension 08/20/2006    PSH:  Past Surgical History:  Procedure Laterality Date  . CARPAL TUNNEL RELEASE  2004   S. Norris MD  . CATARACT EXTRACTION W/ INTRAOCULAR LENS IMPLANT Right 2010  . CATARACT EXTRACTION W/ INTRAOCULAR LENS IMPLANT Left 10/2012  . DILATION AND CURETTAGE OF UTERUS    . EYE  SURGERY Right 07/2009   cataract extraction/IOLI [name], MD  . KNEE ARTHROSCOPY Right 2008   Torn meniscus, Dr. Veverly Fells    Social History:  Social History   Socioeconomic History  . Marital status: Widowed    Spouse name: Not on file  . Number of children: Not on file  . Years of education: Not on file  . Highest education level: Not on file  Occupational History  . Not on file  Tobacco Use  . Smoking status: Former Smoker    Quit date: 02/11/1978    Years since quitting: 42.4  . Smokeless tobacco: Never Used  Vaping Use  . Vaping Use: Never used  Substance and Sexual Activity  . Alcohol use: Yes  . Drug use: No  . Sexual activity: Not Currently  Other Topics Concern  . Not on file  Social History Narrative   Lives alone in a apartment at [facility] in the independent living area since 2007   The patient is not exercising regularly, walking with walker   No specific diet.   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   She has a living will, POA   Former smoker, stopped 1979   Alcohol - one glass of wine at night   Social Determinants of Health   Financial Resource Strain:   . Difficulty of Paying Living Expenses: Not on file  Food Insecurity:   . Worried About Charity fundraiser in the Last Year: Not on file  . Ran Out of Food in the Last Year: Not on file  Transportation Needs:   . Lack of Transportation (Medical): Not on file  . Lack of Transportation (Non-Medical): Not on file  Physical Activity:   . Days of Exercise per Week: Not on file  . Minutes of Exercise per Session: Not on file  Stress:   . Feeling of Stress : Not on file  Social Connections:   . Frequency of Communication with Friends and Family: Not on file  . Frequency of Social Gatherings with Friends and Family: Not on file  . Attends Religious Services: Not on file  . Active Member of Clubs or Organizations: Not on file  . Attends Archivist Meetings: Not on file  . Marital  Status: Not on file  Intimate Partner Violence:   . Fear of Current or Ex-Partner: Not on file  . Emotionally Abused: Not on file  . Physically Abused: Not on file  . Sexually Abused: Not on file    Family History:  Family History  Problem Relation Age of Onset  . Diabetes Brother   . COPD Brother     Medications:   Current Outpatient Medications on File Prior to Visit  Medication Sig Dispense Refill  . acetaminophen (TYLENOL) 325 MG tablet Take 2 tablets (650 mg total) by mouth every 4 (four) hours as needed for mild pain (or temp > 37.5 C (99.5 F)).    . Amino Acids-Protein Hydrolys (FEEDING SUPPLEMENT, PRO-STAT SUGAR FREE 64,) LIQD Take 30 mLs by mouth 2 (two) times daily with a  meal.     . apixaban (ELIQUIS) 2.5 MG TABS tablet Take 1 tablet (2.5 mg total) by mouth 2 (two) times daily. 60 tablet 0  . atorvastatin (LIPITOR) 20 MG tablet Take 1 tablet (20 mg total) by mouth daily. 30 tablet 0  . Dextran 70-Hypromellose (ARTIFICIAL TEARS) 0.1-0.3 % SOLN Apply 1 drop to eye in the morning, at noon, in the evening, and at bedtime. Both eyes    . diltiazem (CARDIZEM CD) 120 MG 24 hr capsule Take 1 capsule (120 mg total) by mouth daily. 30 capsule 0  . ferrous sulfate 325 (65 FE) MG tablet Take 325 mg by mouth. Once a day on Sunday, Tuesday, Thursday and Saturday    . hydrocerin (EUCERIN) CREA Apply 1 application topically every other day. 113 g 0  . loperamide (IMODIUM) 2 MG capsule Take 1 capsule (2 mg total) by mouth every 6 (six) hours as needed for diarrhea or loose stools. 10 capsule 0  . magnesium oxide (MAG-OX) 400 MG tablet Take 400 mg by mouth daily.    . metoprolol tartrate (LOPRESSOR) 50 MG tablet Take 75 mg by mouth 2 (two) times daily.    . mirtazapine (REMERON) 7.5 MG tablet Take 7.5 mg by mouth every other day.     . multivitamin-lutein (OCUVITE-LUTEIN) CAPS capsule Take 1 capsule by mouth in the morning and at bedtime.     . potassium chloride (KLOR-CON) 10 MEQ tablet Take  30 mEq by mouth daily.     [name] torsemide (DEMADEX) 20 MG tablet Take 20 mg by mouth every other day.     . triamcinolone cream (KENALOG) 0.1 % Apply 1 application topically every other day. Once a day. Apply to legs with esch dressing change.    . vitamin B-12 (CYANOCOBALAMIN) 1000 MCG tablet Take 1,000 mcg by mouth daily.    [name] zinc oxide 20 % ointment Apply 1 application topically as needed for irritation.    . ARGININE PO Take by mouth. Arginaid Extra (whey protein-arginine-c-e-zinc) 6-4.5-250 gram-gram-mg liquid. Twice A Day Between Meals 281mL, oral, Twice A Day Between Meals    . saccharomyces boulardii (FLORASTOR) 250 MG capsule Take 250 mg by mouth 2 (two) times daily.     No current facility-administered medications on file prior to visit.    Allergies:   Allergies  Allergen Reactions  . Lomotil [Diphenoxylate] Nausea And Vomiting  . Sulfa Antibiotics Other (See Comments)    Pt unknown   . Amlodipine Swelling      OBJECTIVE:  Physical Exam  Vitals:   06/28/20 1304  BP: 111/77  Pulse: 75  Weight: 99 lb (44.9 kg)  Height: 5\' 2"  (1.575 m)   Body mass index is 18.11 kg/m. No exam data present  Depression screen [facility] 2/9 06/28/2020  Decreased Interest 0  Down, Depressed, Hopeless 0  PHQ - 2 Score 0  Altered sleeping -  Tired, decreased energy -  Change in appetite -  Feeling bad or failure about yourself  -  Trouble concentrating -  Moving slowly or fidgety/restless -  Suicidal thoughts -  PHQ-9 Score -     General: Frail very pleasant elderly Caucasian female, seated, in no evident distress Head: head normocephalic and atraumatic.   Neck: supple with no carotid or supraclavicular bruits Cardiovascular: irregular rate and rhythm, no murmurs Musculoskeletal: no deformity Skin:  no rash/petichiae Vascular:  Normal pulses all extremities   Neurologic Exam Mental Status: Awake and fully alert.   Fluent speech and language.  Oriented to place and time. Recent and  remote memory intact. Attention span, concentration and fund of knowledge appropriate. Mood and affect appropriate.  Cranial Nerves: Fundoscopic exam reveals sharp disc margins. Pupils equal, briskly reactive to light. Extraocular movements full without nystagmus. Visual fields full to confrontation.  Mild HOH bilaterally. Facial sensation intact. Face, tongue, palate moves normally and symmetrically.  Motor: Normal bulk and tone. Normal strength BUE with BLE weakness with difficulty fully assessing due to right knee and left hip pain - baseline per patient Sensory.: intact to touch , pinprick , position and vibratory sensation.  Coordination: Rapid alternating movements normal in all extremities. Finger-to-nose performed accurately bilaterally and heel-to-shin unable to complete Gait and Station: Deferred Reflexes: 1+ and symmetric. Toes downgoing.     NIHSS  0 Modified Rankin  3      ASSESSMENT: Terri Acosta is a 86 y.o. year old female presented with right facial droop and speech difficulty on 05/24/2020 with stroke work-up revealing left MCA frontal patchy infarcts, embolic secondary to known A. fib not on AC. Vascular risk factors include HTN, HLD and A. fib.      PLAN:  1. L MCA stroke :   a. Recovered well without residual deficits.  b. Continue Eliquis (apixaban) daily  and atorvastatin 20 mg daily for secondary stroke prevention.  c. Discussed secondary stroke prevention measures and importance of close PCP/facility follow up for aggressive stroke risk factor management  2. Chronic A. Fib:  a. CHA2DS2-VASc score 6 indicating need for anticoagulation for secondary stroke prevention.   b. Continue on Eliquis 2.5 mg twice daily per cardiology 3. HTN: BP goal <130/90.  Stable on metoprolol and Cardizem per PCP/cardiology/facility 4. HLD: LDL goal <70. Recent LDL 79.  Continue atorvastatin 20 mg daily management prescribed by PCP/facility    Follow up in 4 months or call  earlier if needed   CC:  [facility] provider: Dr. Leonie Man  [name], [name], NP    I spent 40 minutes of face-to-face and non-face-to-face time with patient.  This included previsit chart review including recent hospitalization pertinent progress notes, lab work and imaging, lab review, study review, order entry, electronic health record documentation, patient education regarding recent stroke and etiology, residual deficits, importance of managing stroke risk factors and answered all questions to patient satisfaction   Frann Rider, AGNP-BC  [facility] [address] [place], [place] [address]  Phone [phone] Fax [phone] Note: This document was prepared with digital dictation and possible smart phrase technology. Any transcriptional errors that result from this process are unintentional.

## 2020-06-29 NOTE — Progress Notes (Signed)
I agree with the above plan 

## 2020-07-12 ENCOUNTER — Encounter (HOSPITAL_BASED_OUTPATIENT_CLINIC_OR_DEPARTMENT_OTHER): Payer: Medicare Other | Admitting: Physician Assistant

## 2020-07-23 ENCOUNTER — Encounter: Payer: Self-pay | Admitting: Nurse Practitioner

## 2020-07-23 ENCOUNTER — Non-Acute Institutional Stay (SKILLED_NURSING_FACILITY): Payer: Medicare Other | Admitting: Nurse Practitioner

## 2020-07-23 DIAGNOSIS — I4891 Unspecified atrial fibrillation: Secondary | ICD-10-CM

## 2020-07-23 DIAGNOSIS — L89153 Pressure ulcer of sacral region, stage 3: Secondary | ICD-10-CM

## 2020-07-23 DIAGNOSIS — D649 Anemia, unspecified: Secondary | ICD-10-CM

## 2020-07-23 DIAGNOSIS — I872 Venous insufficiency (chronic) (peripheral): Secondary | ICD-10-CM | POA: Diagnosis not present

## 2020-07-23 DIAGNOSIS — R634 Abnormal weight loss: Secondary | ICD-10-CM | POA: Diagnosis not present

## 2020-07-23 DIAGNOSIS — I63412 Cerebral infarction due to embolism of left middle cerebral artery: Secondary | ICD-10-CM

## 2020-07-23 NOTE — Assessment & Plan Note (Signed)
Anemia, takes Fe, Vit B12. Vit B12 311, Hgb 9.8 06/25/20

## 2020-07-23 NOTE — Assessment & Plan Note (Signed)
IIschemic left MCA stroke MRI, in hospital 05/24/20-05/26/20, restarted Eliquis. Saw neurology 06/28/20

## 2020-07-23 NOTE — Progress Notes (Signed)
Location:  [facility] Room Number: 9-A Place of Service:  SNF (31) Provider:  Halston Fairclough X, NP  Patient Care Team: Iyesha Such X, NP as PCP - General (Internal Medicine) [place], [facility] Netta Cedars, MD as Consulting Physician (Orthopedic Surgery) Luberta Mutter, MD as Consulting Physician (Ophthalmology) Sydnee Levans, MD as Consulting Physician (Dermatology) Adrian Prows, MD as Consulting Physician (Cardiology)  Extended Emergency Contact Information Primary Emergency Contact: [facility] Address: [address]          [address] [place] [address] [name] of [place] Phone: [phone] Mobile Phone: [phone] Relation: Daughter Secondary Emergency Contact: Almeda, Ezra Mobile Phone: [phone] Relation: Son  Code Status:  DNR Goals of care: Advanced Directive information Advanced Directives 07/23/2020  Does Patient Have a Medical Advance Directive? Yes  Type of Advance Directive Living will;Healthcare Power of [place];Out of facility DNR (pink MOST or yellow form)  Does patient want to make changes to medical advance directive? No - Patient declined  Copy of [place] in Chart? Yes - validated most recent copy scanned in chart (See row information)  Would patient like information on creating a medical advance directive? -  Pre-existing out of facility DNR order (yellow form or pink MOST form) Yellow form placed in chart (order not valid for inpatient use)     Chief Complaint  Patient presents with  . Medical Management of Chronic Issues    Routine [facility] visit    HPI:  Pt is a 86 y.o. female seen today for medical management of chronic diseases.      Anemia, takes Fe, Vit B12. Vit B12 311, Hgb 9.8 06/25/20 Afib, takes Metoprolol, Diltiazem, on Eliquis since Ischemic left MCA stroke MRI, in hospital 05/24/20-05/26/20 BLE edema, persists, takes  Torsemide qod. Bun/creat 30/0.6 06/25/20 Mood/weight, takes Mirtazapine, weight loss #3-4Ibs in the past month, TSH 2.095 05/25/20            Sacrococcygeal pressure ulcer, f/u wound care center, covered in dressing during my visit today.               Ischemic left MCA stroke MRI, in hospital 05/24/20-05/26/20, restarted Eliquis. Saw neurology 06/28/20    Past Medical History:  Diagnosis Date  . Abnormal uterine bleeding   . Actinic keratosis 02/13/2012  . Acute bronchospasm 10/31/2011  . Cancer ([place]) 04/2008   Skin cancer of low back [name], MD  . Cataract   . Disorder of bone and cartilage, unspecified 02/18/2007  . Dizziness and giddiness 08/30/2010  . Dysmenorrhea   . Edema 12/19/2011  . Endometriosis   . 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 Osteoarthrosis, unspecified whether generalized or localized, unspecified site 08/20/2006  . Osteoporosis   . Other abnormal blood chemistry 03/14/2011  . Other and unspecified hyperlipidemia 10/18/2010  . Palpitations 02/13/2012  . Postmenopausal bleeding 08/30/2010  . Shortness of breath 11/28/2011  . Supraventricular premature beats 05/23/2011  . Unspecified essential hypertension 08/20/2006   Past Surgical History:  Procedure Laterality Date  . CARPAL TUNNEL RELEASE  2004   S. Norris MD  . CATARACT EXTRACTION W/ INTRAOCULAR LENS IMPLANT Right 2010  . CATARACT EXTRACTION W/ INTRAOCULAR LENS IMPLANT Left 10/2012  . DILATION AND CURETTAGE OF UTERUS    . EYE SURGERY Right 07/2009   cataract extraction/IOLI [name], MD  . KNEE ARTHROSCOPY Right 2008   Torn meniscus, Dr. Veverly Fells  Allergies  Allergen Reactions  . Lomotil [Diphenoxylate] Nausea And Vomiting  . Sulfa Antibiotics Other (See Comments)    Pt unknown   . Amlodipine Swelling    Outpatient Encounter Medications as of 07/23/2020  Medication Sig  . acetaminophen (TYLENOL) 325 MG tablet Take 2 tablets (650 mg total) by mouth every 4 (four) hours as  needed for mild pain (or temp > 37.5 C (99.5 F)).  . Amino Acids-Protein Hydrolys (FEEDING SUPPLEMENT, PRO-STAT SUGAR FREE 64,) LIQD Take 30 mLs by mouth 2 (two) times daily with a meal.   . apixaban (ELIQUIS) 2.5 MG TABS tablet Take 1 tablet (2.5 mg total) by mouth 2 (two) times daily.  [name] atorvastatin (LIPITOR) 20 MG tablet Take 1 tablet (20 mg total) by mouth daily.  [name] Dextran 70-Hypromellose (ARTIFICIAL TEARS) 0.1-0.3 % SOLN Apply 1 drop to eye in the morning, at noon, in the evening, and at bedtime. Both eyes  . diltiazem (CARDIZEM CD) 120 MG 24 hr capsule Take 1 capsule (120 mg total) by mouth daily.  . ferrous sulfate 325 (65 FE) MG tablet Take 325 mg by mouth. Once a day on Sunday, Tuesday, Thursday and Saturday  . loperamide (IMODIUM) 2 MG capsule Take 1 capsule (2 mg total) by mouth every 6 (six) hours as needed for diarrhea or loose stools.  . magnesium oxide (MAG-OX) 400 MG tablet Take 400 mg by mouth daily.  . metoprolol tartrate (LOPRESSOR) 50 MG tablet Take 75 mg by mouth 2 (two) times daily.  . mirtazapine (REMERON) 7.5 MG tablet Take 7.5 mg by mouth every other day.   . multivitamin-lutein (OCUVITE-LUTEIN) CAPS capsule Take 1 capsule by mouth in the morning and at bedtime.   . Nutritional Supplements (ARGINAID EXTRA PO) Take 237 mLs by mouth daily.  . OXYGEN Inhale 2 L into the lungs as needed (Maintain O2 sat greater than or equal to 90%).  . potassium chloride (KLOR-CON) 10 MEQ tablet Take 30 mEq by mouth daily.   [name] saccharomyces boulardii (FLORASTOR) 250 MG capsule Take 250 mg by mouth 2 (two) times daily.  [name] torsemide (DEMADEX) 20 MG tablet Take 20 mg by mouth every other day.   . triamcinolone cream (KENALOG) 0.1 % Apply 1 application topically every other day. Once a day. Apply to legs with esch dressing change.  . vitamin B-12 (CYANOCOBALAMIN) 1000 MCG tablet Take 1,000 mcg by mouth daily.  [name] zinc oxide 20 % ointment Apply 1 application topically as needed for irritation.   . [DISCONTINUED] ARGININE PO Take by mouth. Arginaid Extra (whey protein-arginine-c-e-zinc) 6-4.5-250 gram-gram-mg liquid. Twice A Day Between Meals 230mL, oral, Twice A Day Between Meals  . [DISCONTINUED] hydrocerin (EUCERIN) CREA Apply 1 application topically every other day.   No facility-administered encounter medications on file as of 07/23/2020.    Review of Systems  Constitutional: Positive for unexpected weight change. Negative for activity change and fever.       #3-4Ibs weight loss in the past month.   HENT: Positive for hearing loss. Negative for congestion and trouble swallowing.   Eyes: Negative for visual disturbance.  Respiratory: Negative for cough, shortness of breath and wheezing.        Chronic DOE  Cardiovascular: Positive for leg swelling. Negative for chest pain and palpitations.  Gastrointestinal: Negative for abdominal pain and constipation.  Genitourinary: Negative for dysuria and urgency.  Musculoskeletal: Positive for gait problem.  Skin: Positive for wound. Negative for color change.  Neurological: Negative for speech difficulty, weakness, light-headedness  and headaches.  Psychiatric/Behavioral: Negative for confusion and sleep disturbance. The patient is not nervous/anxious.     Immunization History  Administered Date(s) Administered  . Fluad Quad(high Dose 65+) 05/26/2020  . Influenza Whole 08/21/2010, 05/21/2012  . Influenza, High Dose Seasonal PF 05/30/2017, 06/04/2019  . Influenza,inj,Quad PF,6+ Mos 05/23/2018  . Influenza-Unspecified 06/04/2014, 05/20/2015, 06/08/2016  . Moderna SARS-COVID-2 Vaccination 08/25/2019, 09/22/2019  . Pneumococcal Conjugate-13 08/22/1999  . Td 08/22/1999  . Zoster 08/21/2005   Pertinent  Health Maintenance Due  Topic Date Due  . DEXA SCAN  Never done  . PNA vac Low Risk Adult (2 of 2 - PPSV23) 08/21/2000  . INFLUENZA VACCINE  Completed   Fall Risk  07/23/2019 01/15/2019 07/17/2018 06/04/2018 09/12/2017  Falls in the  past year? 0 0 0 No No  Number falls in past yr: 0 0 0 - -  Injury with Fall? 0 0 0 - -   Functional Status Survey:    Vitals:   07/23/20 1113  BP: (!) 108/91  Pulse: (!) 111  Resp: 18  Temp: 97.9 F (36.6 C)  TempSrc: Oral  SpO2: 100%  Weight: 97 lb 1.6 oz (44 kg)  Height: 5\' 2"  (1.575 m)   Body mass index is 17.76 kg/m. Physical Exam Vitals and nursing note reviewed.  Constitutional:      Appearance: Normal appearance.  HENT:     Head: Normocephalic and atraumatic.     Nose: Nose normal.     Mouth/Throat:     Mouth: Mucous membranes are moist.  Eyes:     Extraocular Movements: Extraocular movements intact.     Conjunctiva/sclera: Conjunctivae normal.     Pupils: Pupils are equal, round, and reactive to light.     Comments: Crusted eyelashes, left eye low vision  Cardiovascular:     Rate and Rhythm: Tachycardia present. Rhythm irregular.     Heart sounds: No murmur heard.      Comments: Weak DP pulses.  Pulmonary:     Effort: Pulmonary effort is normal.     Breath sounds: No rales.  Abdominal:     General: Bowel sounds are normal.     Palpations: Abdomen is soft.     Tenderness: There is no abdominal tenderness.  Musculoskeletal:     Cervical back: Normal range of motion and neck supple.     Right lower leg: Edema present.     Left lower leg: Edema present.     Comments: Trace edema BLE, LLE>RLE  Skin:    General: Skin is warm and dry.     Comments: sacral pressure wound(stage III) per reported, the area is covered in dressing during my examination today. Also BLE are wrapped in dressing.    Neurological:     General: No focal deficit present.     Mental Status: She is alert and oriented to person, place, and time. Mental status is at baseline.     Gait: Gait abnormal.  Psychiatric:        Mood and Affect: Mood normal.        Behavior: Behavior normal.        Thought Content: Thought content normal.        Judgment: Judgment normal.     Labs  reviewed: Recent Labs    01/26/20 0446 01/29/20 0000 03/08/20 0000 04/01/20 0000 05/24/20 1741 05/24/20 1741 05/24/20 1756 05/24/20 1756 05/26/20 0322 05/26/20 0828 06/08/20 0000 06/25/20 0000  NA 130*   < > 137   < >  143   < > 142   < > 142  --  142 141  K 3.6   < > 4.0   < > 3.6   < > 3.5   < > 3.2*  --  3.8 3.6  CL 101   < > 97*   < > 102   < > 102   < > 102  --  100 100  CO2 22   < > 37*   < > 29  --   --    < > 31  --  32* 31*  GLUCOSE 109*  --   --   --  117*  --  113*  --  102*  --   --   --   BUN 15   < > 24*   < > 26*   < > 29*   < > 20  --  28* 30*  CREATININE 0.66   < > 0.6   < > 0.68   < > 0.60   < > 0.60  --  0.6 0.6  CALCIUM 7.7*   < > 8.5*   < > 8.8*  --   --    < > 8.9  --  8.6* 8.7  MG 1.8  --  1.6  --   --   --   --   --   --  2.0  --   --    < > = values in this interval not displayed.   Recent Labs    01/15/20 0840 01/15/20 0840 01/18/20 1302 02/12/20 0000 05/24/20 1741 06/08/20 0000 06/25/20 0000  AST 12   < > 14*   < > 20 13 13   ALT 7   < > 10   < > 17 9 7   ALKPHOS  --   --  47   < > 63 57 51  BILITOT 0.7  --  1.0  --  0.7  --   --   PROT 5.9*  --  5.8*  --  6.3*  --   --   ALBUMIN  --   --  3.0*   < > 2.9* 3.0* 2.9*   < > = values in this interval not displayed.   Recent Labs    01/26/20 0446 01/29/20 0000 05/24/20 1741 05/24/20 1756 05/26/20 0823 06/08/20 0000 06/25/20 0000  WBC 10.5   < > 11.7*   < > 10.5 8.0 6.4  NEUTROABS 8.6*   < > 9.5*  --  8.6*  --  4,749.00  HGB 10.0*   < > 10.3*   < > 10.6* 11.0* 9.8*  HCT 31.6*   < > 35.1*   < > 35.1* 36 32*  MCV 86.8  --  88.4  --  88.4  --   --   PLT 255   < > 265   < > 279 306 301   < > = values in this interval not displayed.   Lab Results  Component Value Date   TSH 2.095 05/25/2020   Lab Results  Component Value Date   HGBA1C 5.4 05/25/2020   Lab Results  Component Value Date   CHOL 133 05/25/2020   HDL 39 (L) 05/25/2020   LDLCALC 79 05/25/2020   TRIG 73 05/25/2020    CHOLHDL 3.4 05/25/2020    Significant Diagnostic Results in last 30 days:  No results found.  Assessment/Plan Anemia Anemia, takes Fe, Vit B12. 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 Bun/creat 30/0.6 06/25/20  Weight loss Mood/weight, takes Mirtazapine, weight loss #3-4Ibs in the past month, TSH 2.095 05/25/20   Pressure ulcer Sacrococcygeal pressure ulcer, f/u wound care center, covered in dressing during my visit today.     Stroke [facility]) IIschemic left MCA stroke MRI, in hospital 05/24/20-05/26/20, restarted Eliquis. Saw neurology 06/28/20      Family/ staff Communication: plan of care reviewed with the patient and charge nurse.   Labs/tests ordered:  None  Time spend 35 minutes.

## 2020-07-23 NOTE — Assessment & Plan Note (Signed)
Afib, takes Metoprolol, Diltiazem, on Eliquis since Ischemic left MCA stroke MRI, in hospital 05/24/20-05/26/20

## 2020-07-23 NOTE — Assessment & Plan Note (Signed)
Mood/weight, takes Mirtazapine, weight loss #3-4Ibs in the past month, TSH 2.095 05/25/20

## 2020-07-23 NOTE — Assessment & Plan Note (Signed)
BLE edema, persists, takes Torsemide qod. Bun/creat 30/0.6 06/25/20

## 2020-07-23 NOTE — Assessment & Plan Note (Signed)
Sacrococcygeal pressure ulcer, f/u wound care center, covered in dressing during my visit today.

## 2020-07-27 ENCOUNTER — Other Ambulatory Visit: Payer: Self-pay

## 2020-07-27 ENCOUNTER — Encounter (HOSPITAL_BASED_OUTPATIENT_CLINIC_OR_DEPARTMENT_OTHER): Payer: Medicare Other | Attending: Internal Medicine | Admitting: Internal Medicine

## 2020-07-27 DIAGNOSIS — L97211 Non-pressure chronic ulcer of right calf limited to breakdown of skin: Secondary | ICD-10-CM | POA: Diagnosis not present

## 2020-07-27 DIAGNOSIS — L89154 Pressure ulcer of sacral region, stage 4: Secondary | ICD-10-CM | POA: Diagnosis present

## 2020-07-27 DIAGNOSIS — I4811 Longstanding persistent atrial fibrillation: Secondary | ICD-10-CM | POA: Diagnosis not present

## 2020-07-27 DIAGNOSIS — L97522 Non-pressure chronic ulcer of other part of left foot with fat layer exposed: Secondary | ICD-10-CM | POA: Diagnosis not present

## 2020-07-27 DIAGNOSIS — Z7901 Long term (current) use of anticoagulants: Secondary | ICD-10-CM | POA: Insufficient documentation

## 2020-07-27 DIAGNOSIS — I87312 Chronic venous hypertension (idiopathic) with ulcer of left lower extremity: Secondary | ICD-10-CM | POA: Diagnosis not present

## 2020-07-27 NOTE — Progress Notes (Signed)
Terri Acosta, Terri Acosta ([id number]) Visit Report for 07/27/2020 Arrival Information Details Patient Name: Date of Service: HA NSO Delane Ginger, [name] IS A. 07/27/2020 12:45 PM Medical Record Number: [id number] Patient Account Number: [id number] Date of Birth/Sex: Treating RN: [birth year] (86 y.o. Martyn Malay, Vaughan Basta Primary Care Cobain Morici: [name], Man Other Clinician: Referring Whit Bruni: Treating Jonn Chaikin/Extender: Lesly Rubenstein, Man Weeks in Treatment: 109 Visit Information History Since Last Visit Added or deleted any medications: No Patient Arrived: Wheel Chair Any new allergies or adverse reactions: No Arrival Time: 12:59 Had a fall or experienced change in No Accompanied By: self activities of daily living that may affect Transfer Assistance: None risk of falls: Patient Identification Verified: Yes Signs or symptoms of abuse/neglect since last visito No Secondary Verification Process Completed: Yes Hospitalized since last visit: No Patient Requires Transmission-Based Precautions: No Implantable device outside of the clinic excluding No Patient Has Alerts: No cellular tissue based products placed in the center since last visit: Has Dressing in Place as Prescribed: Yes Has Compression in Place as Prescribed: Yes Pain Present Now: No Electronic Signature(s) Signed: 07/27/2020 5:33:51 PM By: Baruch Gouty RN, BSN Entered By: Baruch Gouty on 07/27/2020 13:00:01 -------------------------------------------------------------------------------- Compression Therapy Details Patient Name: Date of Service: HA NSO Delane Ginger, [name] 07/27/2020 12:45 PM Medical Record Number: [id number] Patient Account Number: [id number] Date of Birth/Sex: Treating RN: 1922-12-21 (86 y.o. Orvan Falconer Primary Care Azahel Belcastro: [name], Man Other Clinician: Referring Jaina Morin: Treating Deionna Marcantonio/Extender: Lesly Rubenstein, Man Weeks in Treatment: 17 Compression Therapy Performed for Wound Assessment: Wound #10  Right,Proximal,Lateral Lower Leg Performed By: Clinician Carlene Coria, RN Compression Type: Double Layer Post Procedure Diagnosis Same as Pre-procedure Electronic Signature(s) Signed: 07/27/2020 5:28:36 PM By: Carlene Coria RN Entered By: Carlene Coria on 07/27/2020 14:00:16 -------------------------------------------------------------------------------- Compression Therapy Details Patient Name: Date of Service: [name], [name] 07/27/2020 12:45 PM Medical Record Number: [id number] Patient Account Number: [id number] Date of Birth/Sex: Treating RN: December 12, 1922 (86 y.o. Orvan Falconer Primary Care Rayel Santizo: [name], Man Other Clinician: Referring Jaxyn Rout: Treating Kabella Cassidy/Extender: Lesly Rubenstein, Man Weeks in Treatment: 17 Compression Therapy Performed for Wound Assessment: Wound #7 Right,Lateral Lower Leg Performed By: Clinician Carlene Coria, RN Compression Type: Double Layer Post Procedure Diagnosis Same as Pre-procedure Electronic Signature(s) Signed: 07/27/2020 5:28:36 PM By: Carlene Coria RN Entered By: Carlene Coria on 07/27/2020 14:00:16 -------------------------------------------------------------------------------- Compression Therapy Details Patient Name: Date of Service: [name], [name] 07/27/2020 12:45 PM Medical Record Number: [id number] Patient Account Number: [id number] Date of Birth/Sex: Treating RN: 21-Feb-1923 (86 y.o. Orvan Falconer Primary Care Josejulian Tarango: [name], Man Other Clinician: Referring Kosha Jaquith: Treating Brigido Mera/Extender: Lesly Rubenstein, Man Weeks in Treatment: 17 Compression Therapy Performed for Wound Assessment: Wound #9 Left,Lateral Foot Performed By: Clinician Carlene Coria, RN Compression Type: Double Layer Post Procedure Diagnosis Same as Pre-procedure Electronic Signature(s) Signed: 07/27/2020 5:28:36 PM By: Carlene Coria RN Entered By: Carlene Coria on 07/27/2020  14:00:17 -------------------------------------------------------------------------------- Lower Extremity Assessment Details Patient Name: Date of Service: HA NSO Sunday Shams IS A. 07/27/2020 12:45 PM Medical Record Number: [id number] Patient Account Number: [id number] Date of Birth/Sex: Treating RN: 04/07/1923 (86 y.o. Elam Dutch Primary Care Camilah Spillman: [name], Man Other Clinician: Referring Paytience Bures: Treating Tehila Sokolow/Extender: Lesly Rubenstein, Man Weeks in Treatment: 17 Edema Assessment Assessed: [Left: No] [Right: No] Edema: [Left: No] [Right: No] Calf Left: Right: Point of Measurement: 38 cm From Medial Instep 25.5 cm 25.5 cm Ankle Left: Right: Point of Measurement: 11 cm From Medial Instep 16.8 cm 18 cm  Vascular Assessment Pulses: Dorsalis Pedis Palpable: [Left:Yes] [Right:Yes] Electronic Signature(s) Signed: 07/27/2020 5:33:51 PM By: Baruch Gouty RN, BSN Entered By: Baruch Gouty on 07/27/2020 13:18:01 -------------------------------------------------------------------------------- Multi Wound Chart Details Patient Name: Date of Service: HA NSO Delane Ginger, [name] 07/27/2020 12:45 PM Medical Record Number: [id number] Patient Account Number: [id number] Date of Birth/Sex: Treating RN: 1923-05-15 (86 y.o. Orvan Falconer Primary Care Jamas Jaquay: [name], Man Other Clinician: Referring Penelopi Mikrut: Treating Tyanna Hach/Extender: Lesly Rubenstein, Man Weeks in Treatment: 17 Vital Signs Height(in): 64 Pulse(bpm): 69 Weight(lbs): 90 Blood Pressure(mmHg): 106/70 Body Mass Index(BMI): 15 Temperature(F): 97.8 Respiratory Rate(breaths/min): 18 Photos: [10:No Photos Right, Proximal, Lateral Lower Leg] [4:No Photos Sacrum] [7:No Photos Right, Lateral Lower Leg] Wound Location: [10:Skin Tear/Laceration] [4:Gradually Appeared] [7:Shear/Friction] Wounding Event: [10:Skin Tear] [4:Pressure Ulcer] [7:Skin Tear] Primary Etiology: [10:Glaucoma, Hypertension] [4:Glaucoma,  Hypertension] [7:Glaucoma, Hypertension] Comorbid History: [10:07/19/2020] [4:03/29/2020] [7:05/03/2020] Date Acquired: [10:0] [4:17] [7:12] Weeks of Treatment: [10:Open] [4:Open] [7:Open] Wound Status: [10:1x1.1x0.1] [4:2.6x2x1.8] [7:1x1.3x0.1] Measurements L x W x D (cm) [10:0.864] [4:4.084] [7:1.021] A (cm) : rea [10:0.086] [4:7.351] [7:0.102] Volume (cm) : [10:N/A] [4:30.70%] [7:-8.40%] % Reduction in A rea: [10:N/A] [4:30.70%] [7:45.70%] % Reduction in Volume: [4:1] Starting Position 1 (o'clock): [4:11] Ending Position 1 (o'clock): [4:0.6] Maximum Distance 1 (cm): [10:No] [4:Yes] [7:No] Undermining: [10:Full Thickness Without Exposed] [4:Category/Stage IV] [7:Full Thickness Without Exposed] Classification: [10:Support Structures Medium] [4:Small] [7:Support Structures Small] Exudate Amount: [10:Serosanguineous] [4:Serosanguineous] [7:Serosanguineous] Exudate Type: [10:red, brown] [4:red, brown] [7:red, brown] Exudate Color: [10:Flat and Intact] [4:Well defined, not attached] [7:Distinct, outline attached] Wound Margin: [10:Large (67-100%)] [4:Large (67-100%)] [7:Medium (34-66%)] Granulation Amount: [10:Red, Hyper-granulation] [4:Red, Pink] [7:Pink] Granulation Quality: [10:None Present (0%)] [4:None Present (0%)] [7:Medium (34-66%)] Necrotic Amount: [10:Fat Layer (Subcutaneous Tissue): Yes Fat Layer (Subcutaneous Tissue): Yes Fat Layer (Subcutaneous Tissue): Yes] Exposed Structures: [10:Fascia: No Tendon: No Muscle: No Joint: No Bone: No Medium (34-66%)] [4:Fascia: No Tendon: No Muscle: No Joint: No Bone: No None] [7:Fascia: No Tendon: No Muscle: No Joint: No Bone: No Small (1-33%)] Epithelialization: [10:Compression Therapy] [4:N/A] [7:Compression Therapy] Wound Number: 9 N/A N/A Photos: No Photos N/A N/A Left, Lateral Foot N/A N/A Wound Location: Pressure Injury N/A N/A Wounding Event: Pressure Ulcer N/A N/A Primary Etiology: Glaucoma, Hypertension N/A N/A Comorbid  History: 06/14/2020 N/A N/A Date Acquired: 5 N/A N/A Weeks of Treatment: Open N/A N/A Wound Status: 0.7x1x0.2 N/A N/A Measurements L x W x D (cm) 0.55 N/A N/A A (cm) : rea 0.11 N/A N/A Volume (cm) : 78.30% N/A N/A % Reduction in A rea: 56.70% N/A N/A % Reduction in Volume: No N/A N/A Undermining: Category/Stage III N/A N/A Classification: Small N/A N/A Exudate A mount: Serosanguineous N/A N/A Exudate Type: red, brown N/A N/A Exudate Color: Flat and Intact N/A N/A Wound Margin: Medium (34-66%) N/A N/A Granulation A mount: Pink N/A N/A Granulation Quality: Medium (34-66%) N/A N/A Necrotic A mount: Fat Layer (Subcutaneous Tissue): Yes N/A N/A Exposed Structures: Fascia: No Tendon: No Muscle: No Joint: No Bone: No Small (1-33%) N/A N/A Epithelialization: Compression Therapy N/A N/A Procedures Performed: Treatment Notes Electronic Signature(s) Signed: 07/27/2020 5:06:48 PM By: Linton Ham MD Signed: 07/27/2020 5:28:36 PM By: Carlene Coria RN Entered By: Linton Ham on 07/27/2020 14:17:39 -------------------------------------------------------------------------------- Multi-Disciplinary Care Plan Details Patient Name: Date of Service: [name], [name] 07/27/2020 12:45 PM Medical Record Number: [id number] Patient Account Number: [id number] Date of Birth/Sex: Treating RN: 1923/06/08 (86 y.o. Orvan Falconer Primary Care Nialah Saravia: [name], Man Other Clinician: Referring Larua Collier: Treating Antwian Santaana/Extender: Lesly Rubenstein, Man [place]ella [place]  in Treatment: 17 Active Inactive Wound/Skin Impairment Nursing Diagnoses: Impaired tissue integrity Knowledge deficit related to ulceration/compromised skin integrity Goals: Patient/caregiver will verbalize understanding of skin care regimen Date Initiated: 03/29/2020 Target Resolution Date: 08/04/2020 Goal Status: Active Interventions: Assess patient/caregiver ability to obtain necessary supplies Assess  patient/caregiver ability to perform ulcer/skin care regimen upon admission and as needed Assess ulceration(s) every visit Provide education on ulcer and skin care Notes: Electronic Signature(s) Signed: 07/27/2020 5:28:36 PM By: Carlene Coria RN Entered By: Carlene Coria on 07/27/2020 12:45:25 -------------------------------------------------------------------------------- Pain Assessment Details Patient Name: Date of Service: [name], [name] 07/27/2020 12:45 PM Medical Record Number: [id number] Patient Account Number: [id number] Date of Birth/Sex: Treating RN: [birth year] (86 y.o. Elam Dutch Primary Care Sylvia Kondracki: [name], Man Other Clinician: Referring Taronda Comacho: Treating Leaman Abe/Extender: Lesly Rubenstein, Man Weeks in Treatment: 17 Active Problems Location of Pain Severity and Description of Pain Patient Has Paino No Site Locations Rate the pain. Current Pain Level: 0 Pain Management and Medication Current Pain Management: Electronic Signature(s) Signed: 07/27/2020 5:33:51 PM By: Baruch Gouty RN, BSN Entered By: Baruch Gouty on 07/27/2020 13:01:48 -------------------------------------------------------------------------------- Patient/Caregiver Education Details Patient Name: Date of Service: HA NSO Delane Ginger, [name] 12/7/2021andnbsp12:45 PM Medical Record Number: [id number] Patient Account Number: [id number] Date of Birth/Gender: Treating RN: 06-26-1923 (86 y.o. Orvan Falconer Primary Care Physician: [name], Man Other Clinician: Referring Physician: Treating Physician/Extender: Lesly Rubenstein, Man Weeks in Treatment: 17 Education Assessment Education Provided To: Patient Education Topics Provided Wound/Skin Impairment: Methods: Explain/Verbal Responses: State content correctly Electronic Signature(s) Signed: 07/27/2020 5:28:36 PM By: Carlene Coria RN Entered By: Carlene Coria on 07/27/2020  12:45:41 -------------------------------------------------------------------------------- Wound Assessment Details Patient Name: Date of Service: [name], [name] 07/27/2020 12:45 PM Medical Record Number: [id number] Patient Account Number: [id number] Date of Birth/Sex: Treating RN: [birth year] (86 y.o. Elam Dutch Primary Care Alphonzo Devera: [name], Man Other Clinician: Referring Kawena Lyday: Treating Chrishauna Mee/Extender: Lesly Rubenstein, Man Weeks in Treatment: 17 Wound Status Wound Number: 10 Primary Etiology: Skin Tear Wound Location: Right, Proximal, Lateral Lower Leg Wound Status: Open Wounding Event: Skin Tear/Laceration Comorbid History: Glaucoma, Hypertension Date Acquired: 07/19/2020 Weeks Of Treatment: 0 Clustered Wound: No Wound Measurements Length: (cm) 1 Width: (cm) 1.1 Depth: (cm) 0.1 Area: (cm) 0.864 Volume: (cm) 0.086 % Reduction in Area: % Reduction in Volume: Epithelialization: Medium (34-66%) Tunneling: No Undermining: No Wound Description Classification: Full Thickness Without Exposed Support Structures Wound Margin: Flat and Intact Exudate Amount: Medium Exudate Type: Serosanguineous Exudate Color: red, brown Foul Odor After Cleansing: No Slough/Fibrino No Wound Bed Granulation Amount: Large (67-100%) Exposed Structure Granulation Quality: Red, Hyper-granulation Fascia Exposed: No Necrotic Amount: None Present (0%) Fat Layer (Subcutaneous Tissue) Exposed: Yes Tendon Exposed: No Muscle Exposed: No Joint Exposed: No Bone Exposed: No Electronic Signature(s) Signed: 07/27/2020 5:33:51 PM By: Baruch Gouty RN, BSN Entered By: Baruch Gouty on 07/27/2020 13:30:19 -------------------------------------------------------------------------------- Wound Assessment Details Patient Name: Date of Service: [name], [name] 07/27/2020 12:45 PM Medical Record Number: [id number] Patient Account Number: [id number] Date of Birth/Sex: Treating  RN: 10/22/1922 (86 y.o. Elam Dutch Primary Care Abriel Hattery: [name], Man Other Clinician: Referring Rylin Seavey: Treating Ellenor Wisniewski/Extender: Lesly Rubenstein, Man Weeks in Treatment: 17 Wound Status Wound Number: 4 Primary Etiology: Pressure Ulcer Wound Location: Sacrum Wound Status: Open Wounding Event: Gradually Appeared Comorbid History: Glaucoma, Hypertension Date Acquired: 03/29/2020 Weeks Of Treatment: 17 Clustered Wound: No Wound Measurements Length: (cm) 2.6 Width: (cm) 2 Depth: (cm) 1.8 Area: (cm) 4.084 Volume: (  cm) 7.351 % Reduction in Area: 30.7% % Reduction in Volume: 30.7% Epithelialization: None Tunneling: No Undermining: Yes Starting Position (o'clock): 1 Ending Position (o'clock): 11 Maximum Distance: (cm) 0.6 Wound Description Classification: Category/Stage IV Wound Margin: Well defined, not attached Exudate Amount: Small Exudate Type: Serosanguineous Exudate Color: red, brown Foul Odor After Cleansing: No Slough/Fibrino No Wound Bed Granulation Amount: Large (67-100%) Exposed Structure Granulation Quality: Red, Pink Fascia Exposed: No Necrotic Amount: None Present (0%) Fat Layer (Subcutaneous Tissue) Exposed: Yes Tendon Exposed: No Muscle Exposed: No Joint Exposed: No Bone Exposed: No Electronic Signature(s) Signed: 07/27/2020 5:33:51 PM By: Baruch Gouty RN, BSN Entered By: Baruch Gouty on 07/27/2020 13:31:27 -------------------------------------------------------------------------------- Wound Assessment Details Patient Name: Date of Service: [name], [name] 07/27/2020 12:45 PM Medical Record Number: [id number] Patient Account Number: [id number] Date of Birth/Sex: Treating RN: 11/13/1922 (86 y.o. Elam Dutch Primary Care Tabor Denham: [name], Man Other Clinician: Referring Alayia Meggison: Treating Dwayne Begay/Extender: Lesly Rubenstein, Man Weeks in Treatment: 17 Wound Status Wound Number: 7 Primary Etiology: Skin  Tear Wound Location: Right, Lateral Lower Leg Wound Status: Open Wounding Event: Shear/Friction Comorbid History: Glaucoma, Hypertension Date Acquired: 05/03/2020 Weeks Of Treatment: 12 Clustered Wound: No Wound Measurements Length: (cm) 1 Width: (cm) 1.3 Depth: (cm) 0.1 Area: (cm) 1.021 Volume: (cm) 0.102 % Reduction in Area: -8.4% % Reduction in Volume: 45.7% Epithelialization: Small (1-33%) Tunneling: No Undermining: No Wound Description Classification: Full Thickness Without Exposed Support Structures Wound Margin: Distinct, outline attached Exudate Amount: Small Exudate Type: Serosanguineous Exudate Color: red, brown Foul Odor After Cleansing: No Slough/Fibrino No Wound Bed Granulation Amount: Medium (34-66%) Exposed Structure Granulation Quality: Pink Fascia Exposed: No Necrotic Amount: Medium (34-66%) Fat Layer (Subcutaneous Tissue) Exposed: Yes Necrotic Quality: Adherent Slough Tendon Exposed: No Muscle Exposed: No Joint Exposed: No Bone Exposed: No Electronic Signature(s) Signed: 07/27/2020 5:33:51 PM By: Baruch Gouty RN, BSN Entered By: Baruch Gouty on 07/27/2020 13:31:46 -------------------------------------------------------------------------------- Wound Assessment Details Patient Name: Date of Service: [name], [name] 07/27/2020 12:45 PM Medical Record Number: [id number] Patient Account Number: [id number] Date of Birth/Sex: Treating RN: [birth year] (86 y.o. Elam Dutch Primary Care Kayde Warehime: [name], Man Other Clinician: Referring Treshawn Allen: Treating Yancey Pedley/Extender: Lesly Rubenstein, Man Weeks in Treatment: 17 Wound Status Wound Number: 9 Primary Etiology: Pressure Ulcer Wound Location: Left, Lateral Foot Wound Status: Open Wounding Event: Pressure Injury Comorbid History: Glaucoma, Hypertension Date Acquired: 06/14/2020 Weeks Of Treatment: 5 Clustered Wound: No Wound Measurements Length: (cm) 0.7 Width: (cm) 1 Depth:  (cm) 0.2 Area: (cm) 0.55 Volume: (cm) 0.11 % Reduction in Area: 78.3% % Reduction in Volume: 56.7% Epithelialization: Small (1-33%) Tunneling: No Undermining: No Wound Description Classification: Category/Stage III Wound Margin: Flat and Intact Exudate Amount: Small Exudate Type: Serosanguineous Exudate Color: red, brown Foul Odor After Cleansing: No Slough/Fibrino Yes Wound Bed Granulation Amount: Medium (34-66%) Exposed Structure Granulation Quality: Pink Fascia Exposed: No Necrotic Amount: Medium (34-66%) Fat Layer (Subcutaneous Tissue) Exposed: Yes Necrotic Quality: Adherent Slough Tendon Exposed: No Muscle Exposed: No Joint Exposed: No Bone Exposed: No Electronic Signature(s) Signed: 07/27/2020 5:33:51 PM By: Baruch Gouty RN, BSN Entered By: Baruch Gouty on 07/27/2020 13:32:02 -------------------------------------------------------------------------------- Vitals Details Patient Name: Date of Service: [name], [name] 07/27/2020 12:45 PM Medical Record Number: [id number] Patient Account Number: [id number] Date of Birth/Sex: Treating RN: Sep 01, 1922 (86 y.o. Elam Dutch Primary Care Kaula Klenke: [name], Man Other Clinician: Referring Makayli Bracken: Treating Meldrick Buttery/Extender: Lesly Rubenstein, Man Weeks in Treatment: 17 Vital Signs Time Taken: 13:01  Temperature (F): 97.8 Height (in): 64 Pulse (bpm): 69 Source: Stated Respiratory Rate (breaths/min): 18 Weight (lbs): 90 Blood Pressure (mmHg): 106/70 Source: Stated Reference Range: 80 - 120 mg / dl Body Mass Index (BMI): 15.4 Electronic Signature(s) Signed: 07/27/2020 5:33:51 PM By: Baruch Gouty RN, BSN Entered By: Baruch Gouty on 07/27/2020 13:01:40

## 2020-07-27 NOTE — Progress Notes (Signed)
RUWAYDA, CURET ([id number]) Visit Report for 07/27/2020 HPI Details Patient Name: Date of Service: [name], [name] IS A. 07/27/2020 12:45 PM Medical Record Number: [id number] Patient Account Number: [id number] Date of Birth/Sex: Treating RN: Jan 24, 1923 (86 y.o. Orvan Falconer Primary Care Provider: [name], Man Other Clinician: Referring Provider: Treating Provider/Extender: Lesly Rubenstein, Man Weeks in Treatment: 30 History of Present Illness HPI Description: 86 year old female who was admitted in late May and discharged early June for suspected infectious diarrhea and after undergoing inpatient management with hydration, IV antibiotics, she was released to skilled nursing facility for recovery, during this time she developed a sacral wound that was classified as stage III for which they have been using Santyl and gauze packing with saline with daily changes. She also developed left leg skin tears and then shallow wounds that were addressed with silver alginate dressings. Patient originally is from assisted living and owing to her acute illness in June was transition to skilled nursing and the intention is for her to go back to assisted living once her recovery is complete. Patient denies any significant pain or discomfort in the sacrum or the leg areas. Intake and appetite have been good, denies any diarrhea or constipation. She denies any fevers or chills. Patient has atrial fibrillation with irregular heart rhythm with frequent episodes of tachycardia in the 100-120 range, she also runs borderline low blood pressures. Other medical problems includes chronic anemia latest hemoglobin 8.3 she is on iron supplements, mild anasarca with cirrhosis identified per CT scan of the abdomen, infrarenal AAA with partial clot 3.5 cm size. ABI in the clinic was difficult to obtain owing to edema in the leg, marked at 0.62, note that CT scan performed in the hospital showed significant atherosclerotic  disease in the major arteries 8/16; this is a patient who is at the skilled unit of [facility] although she was in the independent up until May. She was hospitalized and developed a pressure sore on her sacrum. Apparently she also had some abrasions to her left lower leg which was traumatic although I know none of the details. She was noted in this clinic to probably have PAD although this is not been aggressively investigated. We did ask for a x-ray of the lower sacrum according to the patient who seems cognitively intact that has not been done 8/30; 2-week follow-up. She [facility] [place]. Pressure sore on her sacrum not much change today although the surface looks somewhat better with the Santyl. We are not doing well with the left leg, part of this is no doubt inadequate compression wrapping a friend's home which is sometimes just over the wound area. She has 2 new areas on the lateral part of the left leg the original wound on the medial part. We finally did get the x-ray of the lower sacrum which was done at the facility that was negative 9/13; 2-week follow-up. Apparently a friend's home cannot handle a wound VAC, which are case manager tried to call them. We are using silver collagen to this area. She has an area also on the left leg but a new area today on the right. 9/27 2-week follow-up. The patient's area on the lower sacrum/coccyx actually looks some better. Healthy looking tissue there is no exposed bone She has areas on the left leg with superficial loss of epithelium. Severely dry scaly skin likely chronic venous insufficiency On the right most of what she has looks like  possible skin cancers. 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 She was transitioned back to Eliquis. She has chronic atrial fibrillation. She also apparently had a Mohs  surgery procedure on the right leg I believe. She has had some healing of the open wounds on both legs she has 1 on the right lateral leg which may be actually a Mohs surgery site. She has an area on the upper left lateral just below the tibial tuberosity. 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 This is about the same I think in terms of dimensions 11/2 patient on the nursing home part of [place] [facility] [place]. Stage IV sacral wound is actually smaller cleaner does not probe to bone using silver collagen backing wet-to-dry She has a new presumably pressure area on the left lateral foot. This required debridement The area on the right lateral lower leg clean base measuring smaller. Raised edges of skin give me the sense of possible underlying skin cancer. I will need to monitor this closely going forward 12/7 her coccyx wound has not really changed does look cleaner. She has developed areas on the right lateral calf and on the left lateral foot.. Either one of the areas on the right might have been a Mohs surgery site. Electronic Signature(s) Signed: 07/27/2020 5:06:48 PM By: Linton Ham MD Entered By: Linton Ham on 07/27/2020 14:18:52 -------------------------------------------------------------------------------- Physical Exam Details Patient Name: Date of Service: [name], [name] 07/27/2020 12:45 PM Medical Record Number: [id number] Patient Account Number: [id number] Date of Birth/Sex: Treating RN: [birth year] (86 y.o. Orvan Falconer Primary Care Provider: [name], Man Other Clinician: Referring Provider: Treating Provider/Extender: Lesly Rubenstein, Man Weeks in Treatment: 17 Constitutional Sitting or standing Blood Pressure is within target range for patient.. Pulse regular and within target range for patient.[name] Respirations regular, non-labored and within target range.. Temperature is normal and within the target range for the patient.[name] Appears in no distress.  Cardiovascular Dorsalis pedis pulses are palpable. Very fragile skin in her bilateral lower extremities. There is not much edema however.. Notes Wound exam Stage IV pressure ulcer this looks like a clean surface there is undermining to the left from 6-12 o'clock. No palpable bone. I do not know that this is changed that much. I do not think we would be able to put a wound VAC here Left lateral foot wound that I debrided last time looks better. On the right lateral there are 2 areas which may have been skin cancer removal surgeries Electronic Signature(s) Signed: 07/27/2020 5:06:48 PM By: Linton Ham MD Entered By: Linton Ham on 07/27/2020 14:22:05 -------------------------------------------------------------------------------- Physician Orders Details Patient Name: Date of Service: [name], [name] 07/27/2020 12:45 PM Medical Record Number: [id number] Patient Account Number: [id number] Date of Birth/Sex: Treating RN: July 13, 1923 (86 y.o. Orvan Falconer Primary Care Provider: [name], Man Other Clinician: Referring Provider: Treating Provider/Extender: Lesly Rubenstein, Man Weeks in Treatment: 21 Verbal / Phone Orders: No Diagnosis Coding ICD-10 Coding Code Description L89.154 Pressure ulcer of sacral region, stage 4 I48.11 Longstanding persistent atrial fibrillation I87.312 Chronic venous hypertension (idiopathic) with ulcer of left lower extremity L97.522 Non-pressure chronic ulcer of other part of left foot with fat layer exposed L97.211 Non-pressure chronic ulcer of right calf limited to breakdown of skin Follow-up Appointments Return appointment in 1 month. Bathing/ Shower/ Hygiene May shower with protection but do not get wound  dressing(s) wet. Edema Control - Lymphedema / SCD / Other Elevate legs to the level of the heart or above for 30 minutes daily and/or when sitting, a frequency of: Avoid standing for long periods of time. Exercise regularly Wound Treatment  Wound #10 - Lower Leg Wound Laterality: Right, Lateral, Proximal Cleanser: Normal Saline (Generic) Every Other Day/30 Days Discharge Instructions: Cleanse the wound with Normal Saline prior to applying a clean dressing using gauze sponges, not tissue or cotton balls. Prim Dressing: Hydrofera Blue Classic Foam, 4x4 in (Generic) Every Other Day/30 Days ary Discharge Instructions: Apply to wound bed as instructed Secondary Dressing: Woven Gauze Sponge, Non-Sterile 4x4 in (Generic) Every Other Day/30 Days Discharge Instructions: Apply over primary dressing as directed. Secondary Dressing: ABD Pad, 5x9 [facility]) Every Other Day/30 Days Discharge Instructions: Apply over primary dressing as directed. Compression Wrap: Kerlix Roll 4.5x3.1 (in/yd) (Generic) Every Other Day/30 Days Discharge Instructions: Apply Kerlix and Coban compression as directed. from base of toes to patella knotch Compression Wrap: Coban Self-Adherent Wrap 4x5 (in/yd) Every Other Day/30 Days Discharge Instructions: Apply over Kerlix as directed. from base of toes to patella knotch Wound #4 - Sacrum Cleanser: Normal Saline (Generic) Every Other Day/30 Days Discharge Instructions: Cleanse the wound with Normal Saline prior to applying a clean dressing using gauze sponges, not tissue or cotton balls. Prim Dressing: Promogran Prisma Matrix, 4.34 (sq in) (silver collagen) (Generic) Every Other Day/30 Days ary Discharge Instructions: moisten with hydrogel Secondary Dressing: ComfortFoam Border, 4x4 in (silicone border) (Generic) Every Other Day/30 Days Discharge Instructions: Apply over primary dressing as directed. Wound #7 - Lower Leg Wound Laterality: Right, Lateral Cleanser: Normal Saline (Generic) Every Other Day/30 Days Discharge Instructions: Cleanse the wound with Normal Saline prior to applying a clean dressing using gauze sponges, not tissue or cotton balls. Prim Dressing: Hydrofera Blue Classic Foam, 4x4 in (Generic)  Every Other Day/30 Days ary Discharge Instructions: Apply to wound bed as instructed Secondary Dressing: Woven Gauze Sponge, Non-Sterile 4x4 in (Generic) Every Other Day/30 Days Discharge Instructions: Apply over primary dressing as directed. Secondary Dressing: ABD Pad, 5x9 [facility]) Every Other Day/30 Days Discharge Instructions: Apply over primary dressing as directed. Compression Wrap: Kerlix Roll 4.5x3.1 (in/yd) (Generic) Every Other Day/30 Days Discharge Instructions: Apply Kerlix and Coban compression as directed. from base of toes to patella knotch Compression Wrap: Coban Self-Adherent Wrap 4x5 (in/yd) Every Other Day/30 Days Discharge Instructions: Apply over Kerlix as directed. from base of toes to patella knotch Wound #9 - Foot Wound Laterality: Left, Lateral Cleanser: Normal Saline (Generic) Every Other Day/30 Days Discharge Instructions: Cleanse the wound with Normal Saline prior to applying a clean dressing using gauze sponges, not tissue or cotton balls. Prim Dressing: Hydrofera Blue Classic Foam, 4x4 in (Generic) Every Other Day/30 Days ary Discharge Instructions: Apply to wound bed as instructed Secondary Dressing: Woven Gauze Sponge, Non-Sterile 4x4 in (Generic) Every Other Day/30 Days Discharge Instructions: Apply over primary dressing as directed. Secondary Dressing: ABD Pad, 5x9 [facility]) Every Other Day/30 Days Discharge Instructions: Apply over primary dressing as directed. Compression Wrap: Kerlix Roll 4.5x3.1 (in/yd) (Generic) Every Other Day/30 Days Discharge Instructions: Apply Kerlix and Coban compression as directed. from base of toes to patella knotch Compression Wrap: Coban Self-Adherent Wrap 4x5 (in/yd) Every Other Day/30 Days Discharge Instructions: Apply over Kerlix as directed. from base of toes to patella knotch Electronic Signature(s) Signed: 07/27/2020 5:06:48 PM By: Linton Ham MD Signed: 07/27/2020 5:28:36 PM By: Carlene Coria RN Entered  By: Dolores Lory  Carrie on 07/27/2020 13:59:37 -------------------------------------------------------------------------------- Problem List Details Patient Name: Date of Service: [name], [name] IS A. 07/27/2020 12:45 PM Medical Record Number: [id number] Patient Account Number: [id number] Date of Birth/Sex: Treating RN: [birth year] (86 y.o. Orvan Falconer Primary Care Provider: [name], Man Other Clinician: Referring Provider: Treating Provider/Extender: Lesly Rubenstein, Man Weeks in Treatment: 17 Active Problems ICD-10 Encounter Code Description Active Date MDM Diagnosis L89.154 Pressure ulcer of sacral region, stage 4 04/05/2020 No Yes I48.11 Longstanding persistent atrial fibrillation 03/29/2020 No Yes I87.312 Chronic venous hypertension (idiopathic) with ulcer of left lower extremity 04/19/2020 No Yes L97.522 Non-pressure chronic ulcer of other part of left foot with fat layer exposed 06/22/2020 No Yes L97.211 Non-pressure chronic ulcer of right calf limited to breakdown of skin 06/22/2020 No Yes Inactive Problems ICD-10 Code Description Active Date Inactive Date L97.221 Non-pressure chronic ulcer of left calf limited to breakdown of skin 03/29/2020 03/29/2020 Resolved Problems Electronic Signature(s) Signed: 07/27/2020 5:06:48 PM By: Linton Ham MD Entered By: Linton Ham on 07/27/2020 14:17:31 -------------------------------------------------------------------------------- Progress Note Details Patient Name: Date of Service: HA NSO Delane Ginger, [name] 07/27/2020 12:45 PM Medical Record Number: [id number] Patient Account Number: [id number] Date of Birth/Sex: Treating RN: 04/09/1923 (86 y.o. Orvan Falconer Primary Care Provider: [name], Man Other Clinician: Referring Provider: Treating Provider/Extender: Lesly Rubenstein, Man Weeks in Treatment: 17 Subjective History of Present Illness (HPI) 86 year old female who was admitted in late May and discharged early June for suspected infectious  diarrhea and after undergoing inpatient management with hydration, IV antibiotics, she was released to skilled nursing facility for recovery, during this time she developed a sacral wound that was classified as stage III for which they have been using Santyl and gauze packing with saline with daily changes. She also developed left leg skin tears and then shallow wounds that were addressed with silver alginate dressings. Patient originally is from assisted living and owing to her acute illness in June was transition to skilled nursing and the intention is for her to go back to assisted living once her recovery is complete. Patient denies any significant pain or discomfort in the sacrum or the leg areas. Intake and appetite have been good, denies any diarrhea or constipation. She denies any fevers or chills. Patient has atrial fibrillation with irregular heart rhythm with frequent episodes of tachycardia in the 100-120 range, she also runs borderline low blood pressures. Other medical problems includes chronic anemia latest hemoglobin 8.3 she is on iron supplements, mild anasarca with cirrhosis identified per CT scan of the abdomen, infrarenal AAA with partial clot 3.5 cm size. ABI in the clinic was difficult to obtain owing to edema in the leg, marked at 0.62, note that CT scan performed in the hospital showed significant atherosclerotic disease in the major arteries 8/16; this is a patient who is at the skilled unit of [place] although she was in the independent up until May. She was hospitalized and developed a pressure sore on her sacrum. Apparently she also had some abrasions to her left lower leg which was traumatic although I know none of the details. She was noted in this clinic to probably have PAD although this is not been aggressively investigated. We did ask for a x-ray of the lower sacrum according to the patient who seems cognitively intact that has not been done 8/30; 2-week  follow-up. She is still at [facility] [place]. Pressure sore on her sacrum not much change today although the  surface looks somewhat better with the Santyl. We are not doing well with the left leg, part of this is no doubt inadequate compression wrapping a friend's home which is sometimes just over the wound area. She has 2 new areas on the lateral part of the left leg the original wound on the medial part. We finally did get the x-ray of the lower sacrum which was done at the facility that was negative 9/13; 2-week follow-up. Apparently a friend's home cannot handle a wound VAC, which are case manager tried to call them. We are using silver collagen to this area. She has an area also on the left leg but a new area today on the right. 9/27 2-week follow-up. The patient's area on the lower sacrum/coccyx actually looks some better. Healthy looking tissue there is no exposed bone ooShe has areas on the left leg with superficial loss of epithelium. Severely dry scaly skin likely chronic venous insufficiency ooOn the right most of what she has looks like possible skin cancers. 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 She was transitioned back to Eliquis. She has chronic atrial fibrillation. She also apparently had a Mohs surgery procedure on the right leg I believe. She has had some healing of the open wounds on both legs she has 1 on the right lateral leg which may be actually a Mohs surgery site. She has an area on the upper left lateral just below the tibial tuberosity. 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 This is about the same I think in terms of dimensions 11/2 patient on the nursing home part of [place]. Stage IV sacral wound is actually smaller cleaner does not probe to bone using silver collagen backing wet-to-dry ooShe has a new presumably  pressure area on the left lateral foot. This required debridement ooThe area on the right lateral lower leg clean base measuring smaller. Raised edges of skin give me the sense of possible underlying skin cancer. I will need to monitor this closely going forward 12/7 her coccyx wound has not really changed does look cleaner. She has developed areas on the right lateral calf and on the left lateral foot.. Either one of the areas on the right might have been a Mohs surgery site. Objective Constitutional Sitting or standing Blood Pressure is within target range for patient.. Pulse regular and within target range for patient.[name] Respirations regular, non-labored and within target range.. Temperature is normal and within the target range for the patient.[name] Appears in no distress. Vitals Time Taken: 1:01 PM, Height: 64 in, Source: Stated, Weight: 90 lbs, Source: Stated, BMI: 15.4, Temperature: 97.8 F, Pulse: 69 bpm, Respiratory Rate: 18 breaths/min, Blood Pressure: 106/70 mmHg. Cardiovascular Dorsalis pedis pulses are palpable. Very fragile skin in her bilateral lower extremities. There is not much edema however.. General Notes: Wound exam ooStage IV pressure ulcer this looks like a clean surface there is undermining to the left from 6-12 o'clock. No palpable bone. I do not know that this is changed that much. I do not think we would be able to put a wound VAC here ooLeft lateral foot wound that I debrided last time looks better. ooOn the right lateral there are 2 areas which may have been skin cancer removal surgeries Integumentary (Hair, Skin) Wound #10 status is Open. Original cause of wound was Skin T ear/Laceration. The wound is located on the  Right,Proximal,Lateral Lower Leg. The wound measures 1cm length x 1.1cm width x 0.1cm depth; 0.864cm^2 area and 0.086cm^3 volume. There is Fat Layer (Subcutaneous Tissue) exposed. There is no tunneling or undermining noted. There is a medium amount of  serosanguineous drainage noted. The wound margin is flat and intact. There is large (67-100%) red, hyper - granulation within the wound bed. There is no necrotic tissue within the wound bed. Wound #4 status is Open. Original cause of wound was Gradually Appeared. The wound is located on the Sacrum. The wound measures 2.6cm length x 2cm width x 1.8cm depth; 4.084cm^2 area and 7.351cm^3 volume. There is Fat Layer (Subcutaneous Tissue) exposed. There is no tunneling noted, however, there is undermining starting at 1:00 and ending at 11:00 with a maximum distance of 0.6cm. There is a small amount of serosanguineous drainage noted. The wound margin is well defined and not attached to the wound base. There is large (67-100%) red, pink granulation within the wound bed. There is no necrotic tissue within the wound bed. Wound #7 status is Open. Original cause of wound was Shear/Friction. The wound is located on the Right,Lateral Lower Leg. The wound measures 1cm length x 1.3cm width x 0.1cm depth; 1.021cm^2 area and 0.102cm^3 volume. There is Fat Layer (Subcutaneous Tissue) exposed. There is no tunneling or undermining noted. There is a small amount of serosanguineous drainage noted. The wound margin is distinct with the outline attached to the wound base. There is medium (34-66%) pink granulation within the wound bed. There is a medium (34-66%) amount of necrotic tissue within the wound bed including Adherent Slough. Wound #9 status is Open. Original cause of wound was Pressure Injury. The wound is located on the Left,Lateral Foot. The wound measures 0.7cm length x 1cm width x 0.2cm depth; 0.55cm^2 area and 0.11cm^3 volume. There is Fat Layer (Subcutaneous Tissue) exposed. There is no tunneling or undermining noted. There is a small amount of serosanguineous drainage noted. The wound margin is flat and intact. There is medium (34-66%) pink granulation within the wound bed. There is a medium (34-66%) amount of  necrotic tissue within the wound bed including Adherent Slough. 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 There was a Double Layer Compression Therapy ear Procedure by Carlene Coria, RN. Post procedure Diagnosis Wound #10: Same as Pre-Procedure Wound #7 Pre-procedure diagnosis of Wound #7 is a Skin T located on the Right,Lateral Lower Leg . There was a Double Layer Compression Therapy Procedure by ear Carlene Coria, RN. Post procedure Diagnosis Wound #7: Same as Pre-Procedure Wound #9 Pre-procedure diagnosis of Wound #9 is a Pressure Ulcer located on the Left,Lateral Foot . There was a Double Layer Compression Therapy Procedure by Carlene Coria, RN. Post procedure Diagnosis Wound #9: Same as Pre-Procedure Plan Follow-up Appointments: Return appointment in 1 month. Bathing/ Shower/ Hygiene: May shower with protection but do not get wound dressing(s) wet. Edema Control - Lymphedema / SCD / Other: Elevate legs to the level of the heart or above for 30 minutes daily and/or when sitting, a frequency of: Avoid standing for long periods of time. Exercise regularly WOUND #10: - Lower Leg Wound Laterality: Right,  Lateral, Proximal Cleanser: Normal Saline (Generic) Every Other Day/30 Days Discharge Instructions: Cleanse the wound with Normal Saline prior to applying a clean dressing using gauze sponges, not tissue or cotton balls. Prim Dressing: Hydrofera Blue Classic Foam, 4x4 in (Generic) Every Other Day/30 Days ary Discharge Instructions: Apply to wound bed as instructed  Secondary Dressing: Woven Gauze Sponge, Non-Sterile 4x4 in (Generic) Every Other Day/30 Days Discharge Instructions: Apply over primary dressing as directed. Secondary Dressing: ABD Pad, 5x9 [facility]) Every Other Day/30 Days Discharge Instructions: Apply over primary dressing as directed. Com pression Wrap: Kerlix Roll 4.5x3.1 (in/yd) (Generic) Every Other Day/30 Days Discharge Instructions: Apply Kerlix and Coban compression as directed. from base of toes to patella knotch Com pression Wrap: Coban Self-Adherent Wrap 4x5 (in/yd) Every Other Day/30 Days Discharge Instructions: Apply over Kerlix as directed. from base of toes to patella knotch WOUND #4: - Sacrum Wound Laterality: Cleanser: Normal Saline (Generic) Every Other Day/30 Days Discharge Instructions: Cleanse the wound with Normal Saline prior to applying a clean dressing using gauze sponges, not tissue or cotton balls. Prim Dressing: Promogran Prisma Matrix, 4.34 (sq in) (silver collagen) (Generic) Every Other Day/30 Days ary Discharge Instructions: moisten with hydrogel Secondary Dressing: ComfortFoam Border, 4x4 in (silicone border) (Generic) Every Other Day/30 Days Discharge Instructions: Apply over primary dressing as directed. WOUND #7: - Lower Leg Wound Laterality: Right, Lateral Cleanser: Normal Saline (Generic) Every Other Day/30 Days Discharge Instructions: Cleanse the wound with Normal Saline prior to applying a clean dressing using gauze sponges, not tissue or cotton balls. Prim Dressing: Hydrofera Blue Classic Foam, 4x4 in (Generic) Every Other Day/30 Days ary Discharge Instructions: Apply to wound bed as instructed Secondary Dressing: Woven Gauze Sponge, Non-Sterile 4x4 in (Generic) Every Other Day/30 Days Discharge Instructions: Apply over primary dressing as directed. Secondary Dressing: ABD Pad, 5x9 [facility]) Every Other Day/30 Days Discharge Instructions: Apply over primary dressing as directed. Com  pression Wrap: Kerlix Roll 4.5x3.1 (in/yd) (Generic) Every Other Day/30 Days Discharge Instructions: Apply Kerlix and Coban compression as directed. from base of toes to patella knotch Com pression Wrap: Coban Self-Adherent Wrap 4x5 (in/yd) Every Other Day/30 Days Discharge Instructions: Apply over Kerlix as directed. from base of toes to patella knotch WOUND #9: - Foot Wound Laterality: Left, Lateral Cleanser: Normal Saline (Generic) Every Other Day/30 Days Discharge Instructions: Cleanse the wound with Normal Saline prior to applying a clean dressing using gauze sponges, not tissue or cotton balls. Prim Dressing: Hydrofera Blue Classic Foam, 4x4 in (Generic) Every Other Day/30 Days ary Discharge Instructions: Apply to wound bed as instructed Secondary Dressing: Woven Gauze Sponge, Non-Sterile 4x4 in (Generic) Every Other Day/30 Days Discharge Instructions: Apply over primary dressing as directed. Secondary Dressing: ABD Pad, 5x9 [facility]) Every Other Day/30 Days Discharge Instructions: Apply over primary dressing as directed. Compression Wrap: Kerlix Roll 4.5x3.1 (in/yd) (Generic) Every Other Day/30 Days Discharge Instructions: Apply Kerlix and Coban compression as directed. from base of toes to patella knotch Compression Wrap: Coban Self-Adherent Wrap 4x5 (in/yd) Every Other Day/30 Days Discharge Instructions: Apply over Kerlix as directed. from base of toes to patella knotch 1. I change the primary dressing on the 2 areas on the right leg and the left foot to Hydrofera Blue still under kerlix Coban 2. Still using silver collagen to the coccyx wound. Not sure this is responding however I wonder about the degree of pressure relief Electronic Signature(s) Signed: 07/27/2020 5:06:48 PM By: Linton Ham MD Entered By: Linton Ham on 07/27/2020 14:22:54 --------------------------------------------------------------------------------  SuperBill Details Patient Name: Date of Service:  [name], [name] 07/27/2020 Medical Record Number: [id number] Patient Account Number: [id number] Date of Birth/Sex: Treating RN: 1923/08/21 (86 y.o. Orvan Falconer Primary Care Provider: [name], Man Other Clinician: Referring Provider: Treating Provider/Extender: Lesly Rubenstein, Man Weeks in Treatment: 17 Diagnosis Coding ICD-10 Codes Code Description L89.154 Pressure ulcer of sacral region, stage 4 I48.11 Longstanding persistent atrial fibrillation I87.312 Chronic venous hypertension (idiopathic) with ulcer of left lower extremity L97.522 Non-pressure chronic ulcer of other part of left foot with fat layer exposed L97.211 Non-pressure chronic ulcer of right calf limited to breakdown of skin Facility Procedures CPT4: Code [id number] Description: 81 BILATERAL: Application of multi-layer venous compression system; leg (below knee), including ankle and t. Modifier: Quantity: 1 Physician Procedures : CPT4 Code Description Modifier [id number] - WC PHYS LEVEL 3 - EST PT ICD-10 Diagnosis Description L89.154 Pressure ulcer of sacral region, stage 4 L97.522 Non-pressure chronic ulcer of other part of left foot with fat layer exposed L97.211  Non-pressure chronic ulcer of right calf limited to breakdown of skin Quantity: 1 Electronic Signature(s) Signed: 07/27/2020 5:06:48 PM By: Linton Ham MD Entered By: Linton Ham on 07/27/2020 14:23:21

## 2020-08-03 ENCOUNTER — Non-Acute Institutional Stay (SKILLED_NURSING_FACILITY): Payer: Medicare Other | Admitting: Nurse Practitioner

## 2020-08-03 ENCOUNTER — Encounter: Payer: Self-pay | Admitting: Nurse Practitioner

## 2020-08-03 ENCOUNTER — Non-Acute Institutional Stay (INDEPENDENT_AMBULATORY_CARE_PROVIDER_SITE_OTHER): Payer: Medicare Other | Admitting: Nurse Practitioner

## 2020-08-03 DIAGNOSIS — I4891 Unspecified atrial fibrillation: Secondary | ICD-10-CM

## 2020-08-03 DIAGNOSIS — Z0000 Encounter for general adult medical examination without abnormal findings: Secondary | ICD-10-CM | POA: Diagnosis not present

## 2020-08-03 DIAGNOSIS — R197 Diarrhea, unspecified: Secondary | ICD-10-CM

## 2020-08-03 DIAGNOSIS — D649 Anemia, unspecified: Secondary | ICD-10-CM

## 2020-08-03 DIAGNOSIS — F5105 Insomnia due to other mental disorder: Secondary | ICD-10-CM

## 2020-08-03 DIAGNOSIS — I63412 Cerebral infarction due to embolism of left middle cerebral artery: Secondary | ICD-10-CM

## 2020-08-03 DIAGNOSIS — I872 Venous insufficiency (chronic) (peripheral): Secondary | ICD-10-CM

## 2020-08-03 DIAGNOSIS — F418 Other specified anxiety disorders: Secondary | ICD-10-CM

## 2020-08-03 DIAGNOSIS — L89154 Pressure ulcer of sacral region, stage 4: Secondary | ICD-10-CM

## 2020-08-03 DIAGNOSIS — I10 Essential (primary) hypertension: Secondary | ICD-10-CM

## 2020-08-03 NOTE — Assessment & Plan Note (Signed)
Diarrhea x 2-3 days since Alginate extra supplement started, held it since yesterday, too soon to eval, Imodium used prn in the past 24 hours, 3 diarrhea today by 2pm. Denied abd pain, nausea, vomiting, she is afebrile. Will update CBC/diff, CMP/eGFR, continue prn Imodium.

## 2020-08-03 NOTE — Assessment & Plan Note (Signed)
Stroke MRI, in hospital 05/24/20-05/26/20, restarted Eliquis. Saw neurology 06/28/20

## 2020-08-03 NOTE — Progress Notes (Signed)
Location:    [facility] of Service:    SNF Provider: Marlana Latus NP  Michaeljohn Biss X, NP  Patient Care Team: Tamilyn Lupien X, NP as PCP - General (Internal Medicine) Melina Modena, [facility] Net[facility] Cedars, MD as Consulting Physician (Orthopedic Surgery) Luberta Mutter, MD as Consulting Physician (Ophthalmology) Sydnee Levans, MD as Consulting Physician (Dermatology) Adrian Prows, MD as Consulting Physician (Cardiology)  Extended Emergency Contact Information Primary Emergency Contact: [facility] Address: [address]          [address] [place] [address] [name] of [place] Phone: [phone] Mobile Phone: [phone] Relation: Daughter Secondary Emergency Contact: Norrine, [place] Mobile Phone: [phone] Relation: Son  Code Status:  DNR Goals of care: Advanced Directive information Advanced Directives 07/23/2020  Does Patient Have a Medical Advance Directive? Yes  Type of Advance Directive Living will;Healthcare Power of [place];Out of facility DNR (pink MOST or yellow form)  Does patient want to make changes to medical advance directive? No - Patient declined  Copy of [place] in Chart? Yes - validated most recent copy scanned in chart (See row information)  Would patient like information on creating a medical advance directive? -  Pre-existing out of facility DNR order (yellow form or pink MOST form) Yellow form placed in chart (order not valid for inpatient use)     Chief Complaint  Patient presents with   Acute Visit    Diarrhea   Medicare Wellness    AWV    HPI:  Pt is a 86 y.o. female seen today for an acute visit for diarrhea x 2-3 days since Alginate extra supplement started, held it since yesterday, too soon to eval, Imodium used prn in the past 24 hours, 3 diarrhea today by 2pm. Denied abd pain, nausea, vomiting, she is afebrile.     Anemia, takes Fe, Vit B12. Vit B12 311, Hgb 9.8  06/25/20 Afib, takes Metoprolol, Diltiazem,onEliquis since Ischemic left MCA stroke MRI, in hospital 05/24/20-05/26/20 BLE edema, minimal, takes Torsemide qod. Bun/creat 30/0.6 06/25/20 Mood/weight, takes Mirtazapine, weight loss #3-4Ibs in the past month, TSH 2.095 05/25/20   Stroke MRI, in hospital 05/24/20-05/26/20, restarted Eliquis. Saw neurology 06/28/20  Sacrococcygeal pressure ulcer, f/u wound care center, covered in dressing during my visit today.  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 Norris MD   CATARACT EXTRACTION W/ INTRAOCULAR LENS IMPLANT Right 2010   CATARACT EXTRACTION W/ INTRAOCULAR LENS IMPLANT Left 10/2012   DILATION AND CURETTAGE OF UTERUS     EYE SURGERY Right 07/2009   cataract extraction/IOLI [name], MD   KNEE ARTHROSCOPY Right  2008   Torn meniscus, Dr. Veverly Fells    Allergies  Allergen Reactions   Lomotil [Diphenoxylate] Nausea And Vomiting   Sulfa Antibiotics Other (See Comments)    Pt unknown    Amlodipine Swelling    Allergies as of 08/03/2020       Reactions   Lomotil [diphenoxylate] Nausea And Vomiting   Sulfa Antibiotics Other (See Comments)   Pt unknown    Amlodipine Swelling      Medication List       Accurate as of August 03, 2020 11:59 PM. If you have any questions, ask your nurse or doctor.        acetaminophen 325 MG tablet Commonly known as: TYLENOL Take 2 tablets (650 mg total) by mouth every 4 (four) hours as needed for mild pain (or temp > 37.5 C (99.5 F)).   apixaban 2.5 MG Tabs tablet Commonly known as: ELIQUIS Take 1 tablet (2.5 mg total) by mouth 2 (two) times daily.   ARGINAID EXTRA PO Take 237 mLs by mouth daily.   Artificial Tears 0.1-0.3 % Soln Generic drug: Dextran 70-Hypromellose Apply 1 drop to eye in the morning, at noon, in the evening, and at bedtime. Both eyes   atorvastatin 20 MG tablet Commonly known as: LIPITOR Take 1 tablet (20 mg total) by mouth daily.   diltiazem 120 MG 24 hr capsule Commonly known as: CARDIZEM CD Take 1 capsule (120 mg total) by mouth daily.   feeding supplement (PRO-STAT SUGAR FREE 64) Liqd Take 30 mLs by mouth 2 (two) times daily with a meal.   ferrous sulfate 325 (65 FE) MG tablet Take 325 mg by mouth. Once a day on Sunday, Tuesday, Thursday and Saturday   loperamide 2 MG capsule Commonly known as: IMODIUM Take 1 capsule (2 mg total) by mouth every 6 (six) hours as needed for diarrhea or loose stools.   magnesium oxide 400 MG tablet Commonly known as: MAG-OX Take 400 mg by mouth daily.   metoprolol tartrate 50 MG tablet Commonly known as: LOPRESSOR Take 75 mg by mouth 2 (two) times daily.   mirtazapine 7.5 MG tablet Commonly known as: REMERON Take 7.5 mg by mouth every other day.   multivitamin-lutein Caps capsule Take 1 capsule by mouth in the morning and at bedtime.   OXYGEN Inhale 2 L into the lungs as needed (Maintain O2 sat greater than or equal to 90%).   potassium chloride 10 MEQ tablet Commonly known as: KLOR-CON Take 30 mEq by  mouth daily.   saccharomyces boulardii 250 MG capsule Commonly known as: FLORASTOR Take 250 mg by mouth 2 (two) times daily.   torsemide 20 MG tablet Commonly known as: DEMADEX Take 20 mg by mouth every other day.   triamcinolone 0.1 % Commonly known as: KENALOG Apply 1 application topically every other day. Once a day. Apply to legs with esch dressing change.   vitamin B-12 1000 MCG tablet Commonly known as: CYANOCOBALAMIN Take 1,000 mcg by mouth daily.   zinc oxide 20 % ointment Apply 1 application topically as needed for irritation.       Review of Systems  Constitutional: Positive for appetite change and fatigue. Negative for fever and unexpected weight change.       #3-4Ibs weight loss in the past month.   HENT: Positive for hearing loss. Negative for congestion and trouble swallowing.   Eyes: Negative for visual disturbance.  Respiratory: Negative for cough, shortness of breath and wheezing.  Chronic DOE  Cardiovascular: Positive for leg swelling. Negative for chest pain and palpitations.  Gastrointestinal: Positive for diarrhea. Negative for abdominal pain, constipation, nausea and vomiting.  Genitourinary: Negative for dysuria and urgency.  Musculoskeletal: Positive for gait problem.  Skin: Positive for wound. Negative for color change.  Neurological: Negative for speech difficulty, weakness, light-headedness and headaches.  Psychiatric/Behavioral: Negative for confusion and sleep disturbance. The patient is not nervous/anxious.     Immunization History  Administered Date(s) Administered   Fluad Quad(high Dose 65+) 05/26/2020   Influenza Whole 08/21/2010, 05/21/2012   Influenza, High Dose Seasonal PF 05/30/2017, 06/04/2019   Influenza,inj,Quad PF,6+ Mos 05/23/2018   Influenza-Unspecified 06/04/2014, 05/20/2015, 06/08/2016   Moderna Sars-Covid-2 Vaccination 08/25/2019, 09/22/2019   Pneumococcal Conjugate-13 08/22/1999   Td 08/22/1999   Zoster  08/21/2005   Pertinent  Health Maintenance Due  Topic Date Due   DEXA SCAN  Never done   PNA vac Low Risk Adult (2 of 2 - PPSV23) 08/21/2000   INFLUENZA VACCINE  Completed   Fall Risk  07/23/2019 01/15/2019 07/17/2018 06/04/2018 09/12/2017  Falls in the past year? 0 0 0 No No  Number falls in past yr: 0 0 0 - -  Injury with Fall? 0 0 0 - -   Functional Status Survey:    Vitals:   08/03/20 1544  BP: 98/62  Pulse: (!) 102  Resp: 18  Temp: (!) 96.8 F (36 C)  SpO2: 93%  Weight: 96 lb 12.8 oz (43.9 kg)  Height: '5\' 2"'  (1.575 m)   Body mass index is 17.7 kg/m. Physical Exam Vitals and nursing note reviewed.  Constitutional:      Comments: Tired than usual  HENT:     Head: Normocephalic and atraumatic.     Nose: Nose normal.     Mouth/Throat:     Mouth: Mucous membranes are moist.  Eyes:     Extraocular Movements: Extraocular movements intact.     Conjunctiva/sclera: Conjunctivae normal.     Pupils: Pupils are equal, round, and reactive to light.     Comments: Crusted eyelashes, left eye low vision  Cardiovascular:     Rate and Rhythm: Tachycardia present. Rhythm irregular.     Heart sounds: No murmur heard.     Comments: Weak DP pulses.  Pulmonary:     Effort: Pulmonary effort is normal.     Breath sounds: No rales.  Abdominal:     General: Bowel sounds are normal. There is no distension.     Palpations: Abdomen is soft.     Tenderness: There is no abdominal tenderness. There is no right CVA tenderness, left CVA tenderness, guarding or rebound.  Musculoskeletal:     Cervical back: Normal range of motion and neck supple.     Right lower leg: Edema present.     Left lower leg: Edema present.     Comments: Minimal  edema BLE, LLE>RLE  Skin:    General: Skin is warm and dry.     Comments: sacral pressure wound(stage III) per reported, the area is covered in dressing during my examination today. Also BLE are wrapped in dressing.    Neurological:     General: No  focal deficit present.     Mental Status: She is alert and oriented to person, place, and time. Mental status is at baseline.     Gait: Gait abnormal.  Psychiatric:        Mood and Affect: Mood normal.        Behavior:  Behavior normal.        Thought Content: Thought content normal.        Judgment: Judgment normal.     Comments: Appears tired.      Labs reviewed: Recent Labs    01/26/20 0446 01/29/20 0000 03/08/20 0000 04/01/20 0000 05/24/20 1741 05/24/20 1756 05/26/20 0322 05/26/20 0828 06/08/20 0000 06/25/20 0000  NA 130*   < > 137   < > 143 142 142  --  142 141  K 3.6   < > 4.0   < > 3.6 3.5 3.2*  --  3.8 3.6  CL 101   < > 97*   < > 102 102 102  --  100 100  CO2 22   < > 37*   < > 29  --  31  --  32* 31*  GLUCOSE 109*  --   --   --  117* 113* 102*  --   --   --   BUN 15   < > 24*   < > 26* 29* 20  --  28* 30*  CREATININE 0.66   < > 0.6   < > 0.68 0.60 0.60  --  0.6 0.6  CALCIUM 7.7*   < > 8.5*   < > 8.8*  --  8.9  --  8.6* 8.7  MG 1.8  --  1.6  --   --   --   --  2.0  --   --    < > = values in this interval not displayed.   Recent Labs    01/15/20 0840 01/18/20 1302 02/12/20 0000 05/24/20 1741 06/08/20 0000 06/25/20 0000  AST 12 14*   < > '20 13 13  ' ALT 7 10   < > '17 9 7  ' ALKPHOS  --  47   < > 63 57 51  BILITOT 0.7 1.0  --  0.7  --   --   PROT 5.9* 5.8*  --  6.3*  --   --   ALBUMIN  --  3.0*   < > 2.9* 3.0* 2.9*   < > = values in this interval not displayed.   Recent Labs    01/26/20 0446 01/29/20 0000 05/24/20 1741 05/24/20 1756 05/26/20 0823 06/08/20 0000 06/25/20 0000  WBC 10.5   < > 11.7*  --  10.5 8.0 6.4  NEUTROABS 8.6*   < > 9.5*  --  8.6*  --  4,749.00  HGB 10.0*   < > 10.3*   < > 10.6* 11.0* 9.8*  HCT 31.6*   < > 35.1*   < > 35.1* 36 32*  MCV 86.8  --  88.4  --  88.4  --   --   PLT 255   < > 265  --  279 306 301   < > = values in this interval not displayed.   Lab Results  Component Value Date   TSH 2.095 05/25/2020   Lab Results   Component Value Date   HGBA1C 5.4 05/25/2020   Lab Results  Component Value Date   CHOL 133 05/25/2020   HDL 39 (L) 05/25/2020   LDLCALC 79 05/25/2020   TRIG 73 05/25/2020   CHOLHDL 3.4 05/25/2020    Significant Diagnostic Results in last 30 days:  No results found.  Assessment/Plan Diarrhea Diarrhea x 2-3 days since Alginate extra supplement started, held it since yesterday, too soon to eval, Imodium used prn in the past 24 hours,  3 diarrhea today by 2pm. Denied abd pain, nausea, vomiting, she is afebrile. Will update CBC/diff, CMP/eGFR, continue prn Imodium.   Anemia Anemia, takes Fe, Vit B12. Vit B12 311, Hgb 9.8 06/25/20   Atrial fibrillation with rapid ventricular response (HCC) Afib, takes Metoprolol, Diltiazem,onEliquis since Ischemic left MCA stroke MRI, in hospital 05/24/20-05/26/20   Edema of both lower extremities due to peripheral venous insufficiency BLE edema, minimal, takes Torsemide qod. Bun/creat 30/0.6 06/25/20  Insomnia secondary to depression with anxiety Mood/weight, takes Mirtazapine, weight loss #3-4Ibs in the past month, TSH 2.095 05/25/20  Pressure injury of skin Sacrococcygeal pressure ulcer, f/u wound care center, covered in dressing during my visit today.   [facility] Stroke MRI, in hospital 05/24/20-05/26/20, restarted Eliquis. Saw neurology 06/28/20  Essential hypertension Low Bp measurements, ? Diarrhea, hold Furosemide x 2 doess. 2.    Family/ staff Communication: plan of care reviewed with the patient and charge nurse.   Labs/tests ordered: CBC/diff, CMP/eGFR in am  Time spend 35 minutes.

## 2020-08-03 NOTE — Assessment & Plan Note (Signed)
Sacrococcygeal pressure ulcer, f/u wound care center, covered in dressing during my visit today.

## 2020-08-03 NOTE — Assessment & Plan Note (Signed)
BLE edema, minimal, takes Torsemide qod. Bun/creat 30/0.6 06/25/20

## 2020-08-03 NOTE — Assessment & Plan Note (Signed)
Mood/weight, takes Mirtazapine, weight loss #3-4Ibs in the past month, TSH 2.095 05/25/20

## 2020-08-03 NOTE — Assessment & Plan Note (Signed)
Anemia, takes Fe, Vit B12. Vit B12 311, Hgb 9.8 06/25/20

## 2020-08-03 NOTE — Assessment & Plan Note (Signed)
Low Bp measurements, ? Diarrhea, hold Furosemide x 2 doess. 2.

## 2020-08-03 NOTE — Assessment & Plan Note (Signed)
   Afib, takes Metoprolol, Diltiazem,onEliquis since Ischemic left MCA stroke MRI, in hospital 05/24/20-05/26/20

## 2020-08-04 ENCOUNTER — Encounter: Payer: Self-pay | Admitting: Nurse Practitioner

## 2020-08-04 LAB — CBC AND DIFFERENTIAL
HCT: 35 — AB (ref 36–46)
Hemoglobin: 11.3 — AB (ref 12.0–16.0)
Neutrophils Absolute: 14739
Platelets: 382 (ref 150–399)
WBC: 17

## 2020-08-04 LAB — BASIC METABOLIC PANEL
BUN: 40 — AB (ref 4–21)
Chloride: 100 (ref 99–108)
Creatinine: 1.3 — AB (ref 0.5–1.1)
Glucose: 84
Potassium: 4 (ref 3.4–5.3)
Sodium: 135 — AB (ref 137–147)

## 2020-08-04 LAB — HEPATIC FUNCTION PANEL
ALT: 6 — AB (ref 7–35)
AST: 11 — AB (ref 13–35)
Alkaline Phosphatase: 54 (ref 25–125)
Bilirubin, Total: 0.5

## 2020-08-04 LAB — COMPREHENSIVE METABOLIC PANEL
Albumin: 2.5 — AB (ref 3.5–5.0)
Calcium: 8.1 — AB (ref 8.7–10.7)
Globulin: 2.3

## 2020-08-04 LAB — CBC: RBC: 4.29 (ref 3.87–5.11)

## 2020-08-04 NOTE — Progress Notes (Signed)
Subjective:   Terri Acosta is a 86 y.o. female who presents for Medicare Annual (Subsequent) preventive examination in [place]    Objective:    There were no vitals filed for this visit. There is no height or weight on file to calculate BMI.  Advanced Directives 07/23/2020 05/27/2020 05/21/2020 03/31/2020 03/04/2020 02/02/2020 01/28/2020  Does Patient Have a Medical Advance Directive? Yes Yes Yes Yes Yes Yes Yes  Type of Advance Directive Living will;Healthcare Power of Attorney;Out of facility DNR (pink MOST or yellow form) Out of facility DNR (pink MOST or yellow form);Living will;Healthcare Power of [place];Living will [place];Out of facility DNR (pink MOST or yellow form) [place];Living will [place];Living will;Out of facility DNR (pink MOST or yellow form) Living will;Healthcare Power of [place];Out of facility DNR (pink MOST or yellow form)  Does patient want to make changes to medical advance directive? No - Patient declined No - Patient declined No - Patient declined No - Patient declined No - Patient declined No - Patient declined No - Patient declined  Copy of [place] in Chart? Yes - validated most recent copy scanned in chart (See row information) Yes - validated most recent copy scanned in chart (See row information) Yes - validated most recent copy scanned in chart (See row information) Yes - validated most recent copy scanned in chart (See row information) Yes - validated most recent copy scanned in chart (See row information) Yes - validated most recent copy scanned in chart (See row information) Yes - validated most recent copy scanned in chart (See row information)  Would patient like information on creating a medical advance directive? - - - - - - -  Pre-existing out of facility DNR order (yellow form or pink MOST form) Yellow form placed  in chart (order not valid for inpatient use) Yellow form placed in chart (order not valid for inpatient use) - Yellow form placed in chart (order not valid for inpatient use) - Yellow form placed in chart (order not valid for inpatient use) Yellow form placed in chart (order not valid for inpatient use)    Current Medications (verified) Outpatient Encounter Medications as of 08/03/2020  Medication Sig  . acetaminophen (TYLENOL) 325 MG tablet Take 2 tablets (650 mg total) by mouth every 4 (four) hours as needed for mild pain (or temp > 37.5 C (99.5 F)).  . Amino Acids-Protein Hydrolys (FEEDING SUPPLEMENT, PRO-STAT SUGAR FREE 64,) LIQD Take 30 mLs by mouth 2 (two) times daily with a meal.   . apixaban (ELIQUIS) 2.5 MG TABS tablet Take 1 tablet (2.5 mg total) by mouth 2 (two) times daily.  [name] atorvastatin (LIPITOR) 20 MG tablet Take 1 tablet (20 mg total) by mouth daily.  [name] Dextran 70-Hypromellose (ARTIFICIAL TEARS) 0.1-0.3 % SOLN Apply 1 drop to eye in the morning, at noon, in the evening, and at bedtime. Both eyes  . diltiazem (CARDIZEM CD) 120 MG 24 hr capsule Take 1 capsule (120 mg total) by mouth daily.  . ferrous sulfate 325 (65 FE) MG tablet Take 325 mg by mouth. Once a day on Sunday, Tuesday, Thursday and Saturday  . loperamide (IMODIUM) 2 MG capsule Take 1 capsule (2 mg total) by mouth every 6 (six) hours as needed for diarrhea or loose stools.  . magnesium oxide (MAG-OX) 400 MG tablet Take 400 mg by mouth daily.  . metoprolol tartrate (LOPRESSOR) 50  MG tablet Take 75 mg by mouth 2 (two) times daily.  . mirtazapine (REMERON) 7.5 MG tablet Take 7.5 mg by mouth every other day.   . multivitamin-lutein (OCUVITE-LUTEIN) CAPS capsule Take 1 capsule by mouth in the morning and at bedtime.   . Nutritional Supplements (ARGINAID EXTRA PO) Take 237 mLs by mouth daily.  . OXYGEN Inhale 2 L into the lungs as needed (Maintain O2 sat greater than or equal to 90%).  . potassium chloride (KLOR-CON) 10 MEQ  tablet Take 30 mEq by mouth daily.   [name] saccharomyces boulardii (FLORASTOR) 250 MG capsule Take 250 mg by mouth 2 (two) times daily.  [name] torsemide (DEMADEX) 20 MG tablet Take 20 mg by mouth every other day.   . triamcinolone cream (KENALOG) 0.1 % Apply 1 application topically every other day. Once a day. Apply to legs with esch dressing change.  . vitamin B-12 (CYANOCOBALAMIN) 1000 MCG tablet Take 1,000 mcg by mouth daily.  [name] zinc oxide 20 % ointment Apply 1 application topically as needed for irritation.   No facility-administered encounter medications on file as of 08/03/2020.    Allergies (verified) Lomotil [diphenoxylate], Sulfa antibiotics, and Amlodipine   History: Past Medical History:  Diagnosis Date  . Abnormal uterine bleeding   . Actinic keratosis 02/13/2012  . Acute bronchospasm 10/31/2011  . Cancer ([place]) 04/2008   Skin cancer of low back [name], MD  . Cataract   . Disorder of bone and cartilage, unspecified 02/18/2007  . Dizziness and giddiness 08/30/2010  . Dysmenorrhea   . Edema 12/19/2011  . Endometriosis   . 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 Osteoarthrosis, unspecified whether generalized or localized, unspecified site 08/20/2006  . Osteoporosis   . Other abnormal blood chemistry 03/14/2011  . Other and unspecified hyperlipidemia 10/18/2010  . Palpitations 02/13/2012  . Postmenopausal bleeding 08/30/2010  . Shortness of breath 11/28/2011  . Supraventricular premature beats 05/23/2011  . Unspecified essential hypertension 08/20/2006   Past Surgical History:  Procedure Laterality Date  . CARPAL TUNNEL RELEASE  2004   S. Norris MD  . CATARACT EXTRACTION W/ INTRAOCULAR LENS IMPLANT Right 2010  . CATARACT EXTRACTION W/ INTRAOCULAR LENS IMPLANT Left 10/2012  . DILATION AND CURETTAGE OF UTERUS    . EYE SURGERY Right 07/2009   cataract extraction/IOLI [name], MD  . KNEE ARTHROSCOPY Right 2008   Torn meniscus, Dr. Veverly Fells   Family History  Problem  Relation Age of Onset  . Diabetes Brother   . COPD Brother    Social History   Socioeconomic History  . Marital status: Widowed    Spouse name: Not on file  . Number of children: Not on file  . Years of education: Not on file  . Highest education level: Not on file  Occupational History  . Not on file  Tobacco Use  . Smoking status: Former Smoker    Quit date: 02/11/1978    Years since quitting: 42.5  . Smokeless tobacco: Never Used  Vaping Use  . Vaping Use: Never used  Substance and Sexual Activity  . Alcohol use: Yes  . Drug use: No  . Sexual activity: Not Currently  Other Topics Concern  . Not on file  Social History Narrative   Lives alone in a apartment at [facility] in the independent living area since 2007   The patient is not exercising regularly, walking with walker   No specific diet.   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   She has  a living will, POA   Former smoker, stopped 1979   Alcohol - one glass of wine at night   Social Determinants of Health   Financial Resource Strain: Not on file  Food Insecurity: Not on file  Transportation Needs: Not on file  Physical Activity: Not on file  Stress: Not on file  Social Connections: Not on file    Tobacco Counseling Counseling given: Not Answered   Clinical Intake:  Pre-visit preparation completed: Yes  Pain : No/denies pain     BMI - recorded: 17.7 Nutritional Status: BMI <19  Underweight Nutritional Risks: Unintentional weight loss Diabetes: No  How often do you need to have someone help you when you read instructions, pamphlets, or other written materials from your doctor or pharmacy?: 2 - Rarely What is the last grade level you completed in school?: college  Diabetic? No   Interpreter Needed?: No  Information entered by :: Vela Render Bretta Bang NP   Activities of Daily Living In your present state of health, do you have any difficulty performing the following activities: 08/04/2020  01/18/2020  Hearing? [place]? N -  Difficulty concentrating or making decisions? Y -  Walking or climbing stairs? Y -  Dressing or bathing? Y -  Doing errands, shopping? [place]pie Donning  Preparing Food and eating ? N -  Comment feeding self in [facility] -  Using the Toilet? N -  In the past six months, have you accidently leaked urine? Y -  Do you have problems with loss of bowel control? Y -  Managing your Medications? Y -  Managing your Finances? Y -  Housekeeping or managing your Housekeeping? Y -  Some recent data might be hidden    Patient Care Team: Jeniya Flannigan X, NP as PCP - General (Internal Medicine) [place], [facility] Netta Cedars, MD as Consulting Physician (Orthopedic Surgery) Luberta Mutter, MD as Consulting Physician (Ophthalmology) Sydnee Levans, MD as Consulting Physician (Dermatology) Adrian Prows, MD as Consulting Physician (Cardiology)  Indicate any recent [place] you may have received from other than [facility] providers in the past year (date may be approximate).     Assessment:   This is a routine wellness examination for [place].  Hearing/Vision screen No exam data present  Dietary issues and exercise activities discussed: Current Exercise Habits: Structured exercise class, Type of exercise: strength training/weights;stretching, Time (Minutes): 30, Frequency (Times/Week): 2, Weekly Exercise (Minutes/Week): 60, Intensity: Mild, Exercise limited by: cardiac condition(s);orthopedic condition(s)  Goals    . Gain weight    . Maintain Lifestyle     Starting today pt will maintain lifestyle.       Depression Screen PHQ 2/9 Scores 06/28/2020 01/15/2019 09/12/2017 07/18/2017 07/04/2016 01/04/2016 07/06/2015  PHQ - 2 Score 0 0 0 0 0 0 0  PHQ- 9 Score - - - 0 - - -    Fall Risk Fall Risk  07/23/2019 01/15/2019 07/17/2018 06/04/2018 09/12/2017  Falls in the past year? 0 0 0 No No  Number falls in past yr: 0 0 0 - -  Injury with Fall? 0 0 0 - -    FALL RISK  PREVENTION PERTAINING TO THE HOME:  Any stairs in or around the home? Yes  If so, are there any without handrails? Yes  Home free of loose throw rugs in walkways, pet beds, electrical cords, etc? Yes  Adequate lighting in your home to reduce risk of falls? Yes   ASSISTIVE DEVICES UTILIZED TO PREVENT FALLS:  Life  alert? No  Use of a cane, walker or w/c? Yes  Grab bars in the bathroom? Yes  Shower chair or bench in shower? Yes  Elevated toilet seat or a handicapped toilet? Yes   TIMED UP AND GO:  Was the test performed? No .   Gait unsteady with use of assistive device, provider informed and education provided.   Cognitive Function: MMSE - Mini Mental State Exam 01/16/2018 07/04/2016 02/11/2013  Not completed: (No Data) - -  Orientation to time 5 5 5   Orientation to Place 5 5 5   Registration 3 3 3   Attention/ Calculation 5 5 5   Recall 3 3 1   Language- name 2 objects 2 2 2   Language- repeat 1 1 1   Language- follow 3 step command 3 3 3   Language- read & follow direction 1 1 1   Write a sentence 1 1 1   Copy design 1 1 1   Total score 30 30 28         Immunizations Immunization History  Administered Date(s) Administered  . Fluad Quad(high Dose 65+) 05/26/2020  . Influenza Whole 08/21/2010, 05/21/2012  . Influenza, High Dose Seasonal PF 05/30/2017, 06/04/2019  . Influenza,inj,Quad PF,6+ Mos 05/23/2018  . Influenza-Unspecified 06/04/2014, 05/20/2015, 06/08/2016  . Moderna Sars-Covid-2 Vaccination 08/25/2019, 09/22/2019  . Pneumococcal Conjugate-13 08/22/1999  . Td 08/22/1999  . Zoster 08/21/2005    TDAP status: Up to date  Flu Vaccine status: Up to date  Pneumococcal vaccine status: Due, Education has been provided regarding the importance of this vaccine. Advised may receive this vaccine at local pharmacy or Health Dept. Aware to provide a copy of the vaccination record if obtained from local pharmacy or Health Dept. Verbalized acceptance and understanding.  Covid-19  vaccine status: Completed vaccines  Qualifies for Shingles Vaccine? No   Zostavax completed No   Shingrix Completed?: No.    Education has been provided regarding the importance of this vaccine. Patient has been advised to call insurance company to determine out of pocket expense if they have not yet received this vaccine. Advised may also receive vaccine at local pharmacy or Health Dept. Verbalized acceptance and understanding.  Screening Tests Health Maintenance  Topic Date Due  . DEXA SCAN  Never done  . PNA vac Low Risk Adult (2 of 2 - PPSV23) 08/21/2000  . TETANUS/TDAP  08/21/2009  . COVID-19 Vaccine (3 - Booster for Moderna series) 03/21/2020  . INFLUENZA VACCINE  Completed    Health Maintenance  Health Maintenance Due  Topic Date Due  . DEXA SCAN  Never done  . PNA vac Low Risk Adult (2 of 2 - PPSV23) 08/21/2000  . TETANUS/TDAP  08/21/2009  . COVID-19 Vaccine (3 - Booster for Moderna series) 03/21/2020    Colorectal cancer screening: No longer required.   Mammogram status: No longer required due to age.  Bone Density status: Ordered the patient declined. Pt provided with contact info and advised to call to schedule appt.  Lung Cancer Screening: (Low Dose CT Chest recommended if Age 21-80 years, 30 pack-year currently smoking OR have quit w/in 15years.) does not qualify.   Lung Cancer Screening Referral: no  Additional Screening:  Hepatitis C Screening: does not qualify; Completed NA  Vision Screening: Recommended annual ophthalmology exams for early detection of glaucoma and other disorders of the eye. Is the patient up to date with their annual eye exam?  Yes  Who is the provider or what is the name of the office in which the patient attends annual eye  exams? pending If pt is not established with a provider, would they like to be referred to a provider to establish care? No .   Dental Screening: Recommended annual dental exams for proper oral hygiene  Community  Resource Referral / Chronic Care Management: CRR required this visit?  No   CCM required this visit?  No      Plan:     I have personally reviewed and noted the following in the patient's chart:   . Medical and social history . Use of alcohol, tobacco or illicit drugs  . Current medications and supplements . Functional ability and status . Nutritional status . Physical activity . Advanced directives . List of other physicians . Hospitalizations, surgeries, and ER visits in previous 12 months . Vitals . Screenings to include cognitive, depression, and falls . Referrals and appointments  In addition, I have reviewed and discussed with patient certain preventive protocols, quality metrics, and best practice recommendations. A written personalized care plan for preventive services as well as general preventive health recommendations were provided to patient.   Prevnar 13 prescription provided. The patient declined shingle vaccine.   Jaki Hammerschmidt X Berish Bohman, NP   08/04/2020   Nurse Notes: no

## 2020-08-05 ENCOUNTER — Encounter: Payer: Self-pay | Admitting: Internal Medicine

## 2020-08-05 ENCOUNTER — Non-Acute Institutional Stay (SKILLED_NURSING_FACILITY): Payer: Medicare Other | Admitting: Internal Medicine

## 2020-08-05 DIAGNOSIS — E860 Dehydration: Secondary | ICD-10-CM

## 2020-08-05 DIAGNOSIS — I4891 Unspecified atrial fibrillation: Secondary | ICD-10-CM

## 2020-08-05 DIAGNOSIS — D649 Anemia, unspecified: Secondary | ICD-10-CM | POA: Diagnosis not present

## 2020-08-05 DIAGNOSIS — A0472 Enterocolitis due to Clostridium difficile, not specified as recurrent: Secondary | ICD-10-CM | POA: Diagnosis not present

## 2020-08-05 DIAGNOSIS — I63412 Cerebral infarction due to embolism of left middle cerebral artery: Secondary | ICD-10-CM

## 2020-08-05 NOTE — Progress Notes (Addendum)
Location:    [facility] Room Number: 9 Place of Service:  SNF [phone]) Provider:  Veleta Miners MD  [name], [name], NP  Patient Care Team: [name], [name], NP as PCP - General (Internal Medicine) Melina Modena, [facility] Netta Cedars, MD as Consulting Physician (Orthopedic Surgery) Luberta Mutter, MD as Consulting Physician (Ophthalmology) Sydnee Levans, MD as Consulting Physician (Dermatology) Adrian Prows, MD as Consulting Physician (Cardiology)  Extended Emergency Contact Information Primary Emergency Contact: [facility] Address: [address]          [address] [place] [address] [name] of [place] Phone: [phone] Mobile Phone: [phone] Relation: Daughter Secondary Emergency Contact: Jonae, [place] Mobile Phone: [phone] Relation: Son  Code Status:  DNR Goals of care: Advanced Directive information Advanced Directives 07/23/2020  Does Patient Have a Medical Advance Directive? Yes  Type of Advance Directive Living will;Healthcare Power of [place];Out of facility DNR (pink MOST or yellow form)  Does patient want to make changes to medical advance directive? No - Patient declined  Copy of [place] in Chart? Yes - validated most recent copy scanned in chart (See row information)  Would patient like information on creating a medical advance directive? -  Pre-existing out of facility DNR order (yellow form or pink MOST form) Yellow form placed in chart (order not valid for inpatient use)     Chief Complaint  Patient presents with  . Acute Visit    Confusion and leukocytosis    HPI:  Pt is a 86 y.o. female seen today for an acute visit for Feeling Weak and Diarrhea and Abdominal Pain  Patient has h/o Hypertension, Hyperlipidemia, Osteoarthritis, Unstable Gait Wheelchair Dependent,h/o Vaginal Atropy, Macular Degeneration And h/o A Fib and Chronic Sacral Wound and LE wounds Was admitted in the  Hospital from 10/04-10/06 for Acute Left MCA stroke Due to Emboli  Patient was seen 2 days ago for Confusion and Diarrhea Labs were ordered. Which Showed Leucocytosis with White count of 17 K Chest Xray showed Bilateral Infiltrate but patient continues to be asymptomatic  No Cough or SOB or Chest pain or fever But she had 2 more loose stools today with Decreased appetite weakness Lower abdominal Pain Her stool now came positive for C Diff Past Medical History:  Diagnosis Date  . Abnormal uterine bleeding   . Actinic keratosis 02/13/2012  . Acute bronchospasm 10/31/2011  . Cancer ([place]) 04/2008   Skin cancer of low back [name], MD  . Cataract   . Disorder of bone and cartilage, unspecified 02/18/2007  . Dizziness and giddiness 08/30/2010  . Dysmenorrhea   . Edema 12/19/2011  . Endometriosis   . 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 Osteoarthrosis, unspecified whether generalized or localized, unspecified site 08/20/2006  . Osteoporosis   . Other abnormal blood chemistry 03/14/2011  . Other and unspecified hyperlipidemia 10/18/2010  . Palpitations 02/13/2012  . Postmenopausal bleeding 08/30/2010  . Shortness of breath 11/28/2011  . Supraventricular premature beats 05/23/2011  . Unspecified essential hypertension 08/20/2006   Past Surgical History:  Procedure Laterality Date  . CARPAL TUNNEL RELEASE  2004   S. Norris MD  . CATARACT EXTRACTION W/ INTRAOCULAR LENS IMPLANT Right 2010  . CATARACT EXTRACTION W/ INTRAOCULAR LENS IMPLANT Left 10/2012  . DILATION AND CURETTAGE OF UTERUS    . EYE SURGERY Right 07/2009   cataract extraction/IOLI [name], MD  . KNEE ARTHROSCOPY Right 2008   Torn  meniscus, Dr. Veverly Fells    Allergies  Allergen Reactions  . Lomotil [Diphenoxylate] Nausea And Vomiting  . Sulfa Antibiotics Other (See Comments)    Pt unknown   . Amlodipine Swelling    Allergies as of 08/05/2020      Reactions   Lomotil [diphenoxylate] Nausea And Vomiting   Sulfa  Antibiotics Other (See Comments)   Pt unknown    Amlodipine Swelling      Medication List       Accurate as of August 05, 2020  9:40 AM. If you have any questions, ask your nurse or doctor.        acetaminophen 325 MG tablet Commonly known as: TYLENOL Take 2 tablets (650 mg total) by mouth every 4 (four) hours as needed for mild pain (or temp > 37.5 C (99.5 F)).   apixaban 2.5 MG Tabs tablet Commonly known as: ELIQUIS Take 1 tablet (2.5 mg total) by mouth 2 (two) times daily.   ARGINAID EXTRA PO Take 237 mLs by mouth daily.   Artificial Tears 0.1-0.3 % Soln Generic drug: Dextran 70-Hypromellose Apply 1 drop to eye in the morning, at noon, in the evening, and at bedtime. Both eyes   atorvastatin 20 MG tablet Commonly known as: LIPITOR Take 1 tablet (20 mg total) by mouth daily.   diltiazem 120 MG 24 hr capsule Commonly known as: CARDIZEM CD Take 1 capsule (120 mg total) by mouth daily.   feeding supplement (PRO-STAT SUGAR FREE 64) Liqd Take 30 mLs by mouth 2 (two) times daily with a meal.   ferrous sulfate 325 (65 FE) MG tablet Take 325 mg by mouth. Once a day on Sunday, Tuesday, Thursday and Saturday   loperamide 2 MG capsule Commonly known as: IMODIUM Take 1 capsule (2 mg total) by mouth every 6 (six) hours as needed for diarrhea or loose stools.   magnesium oxide 400 MG tablet Commonly known as: MAG-OX Take 400 mg by mouth daily.   metoprolol tartrate 50 MG tablet Commonly known as: LOPRESSOR Take 75 mg by mouth 2 (two) times daily.   mirtazapine 7.5 MG tablet Commonly known as: REMERON Take 7.5 mg by mouth every other day.   multivitamin-lutein Caps capsule Take 1 capsule by mouth in the morning and at bedtime.   OXYGEN Inhale 2 L into the lungs as needed (Maintain O2 sat greater than or equal to 90%).   potassium chloride 10 MEQ tablet Commonly known as: KLOR-CON Take 30 mEq by mouth daily.   saccharomyces boulardii 250 MG capsule Commonly  known as: FLORASTOR Take 250 mg by mouth 2 (two) times daily.   torsemide 20 MG tablet Commonly known as: DEMADEX Take 20 mg by mouth every other day.   triamcinolone 0.1 % Commonly known as: KENALOG Apply 1 application topically every other day. Once a day. Apply to legs with esch dressing change.   vitamin B-12 1000 MCG tablet Commonly known as: CYANOCOBALAMIN Take 1,000 mcg by mouth daily.   zinc oxide 20 % ointment Apply 1 application topically as needed for irritation.       Review of Systems  Constitutional: Positive for activity change and appetite change.  HENT: Negative.   Respiratory: Negative for cough and shortness of breath.   Cardiovascular: Negative.   Gastrointestinal: Positive for abdominal pain and diarrhea.  Genitourinary: Negative.  Negative for frequency and urgency.  Musculoskeletal: Positive for gait problem.  Skin: Negative.   Neurological: Positive for weakness.  Psychiatric/Behavioral: Positive for confusion.  Immunization History  Administered Date(s) Administered  . Fluad Quad(high Dose 65+) 05/26/2020  . Influenza Whole 08/21/2010, 05/21/2012  . Influenza, High Dose Seasonal PF 05/30/2017, 06/04/2019  . Influenza,inj,Quad PF,6+ Mos 05/23/2018  . Influenza-Unspecified 06/04/2014, 05/20/2015, 06/08/2016  . Moderna Sars-Covid-2 Vaccination 08/25/2019, 09/22/2019  . Pneumococcal Conjugate-13 08/22/1999  . Td 08/22/1999  . Zoster 08/21/2005   Pertinent  Health Maintenance Due  Topic Date Due  . DEXA SCAN  Never done  . PNA vac Low Risk Adult (2 of 2 - PPSV23) 08/21/2000  . INFLUENZA VACCINE  Completed   Fall Risk  07/23/2019 01/15/2019 07/17/2018 06/04/2018 09/12/2017  Falls in the past year? 0 0 0 No No  Number falls in past yr: 0 0 0 - -  Injury with Fall? 0 0 0 - -   Functional Status Survey:    Vitals:   08/05/20 0935  BP: 104/60  Pulse: (!) 103  Resp: 18  Temp: 98 F (36.7 C)  SpO2: 92%  Weight: 96 lb (43.5 kg)  Height:  5\' 2"  (1.575 m)   Body mass index is 17.56 kg/m. Physical Exam Vitals reviewed.  Constitutional:      Comments: Very weak and Frail  HENT:     Head: Normocephalic.     Nose: Nose normal.     Mouth/Throat:     Mouth: Mucous membranes are dry.     Pharynx: Oropharynx is clear.  Eyes:     Pupils: Pupils are equal, round, and reactive to light.  Cardiovascular:     Rate and Rhythm: Normal rate. Rhythm irregular.     Pulses: Normal pulses.  Pulmonary:     Effort: Pulmonary effort is normal. No respiratory distress.     Breath sounds: Normal breath sounds. No wheezing or rales.  Abdominal:     General: Abdomen is flat. Bowel sounds are normal.     Palpations: Abdomen is soft.     Comments: C/o Tenderness in Lower abdomen  Musculoskeletal:        General: No swelling.     Cervical back: Neck supple.  Skin:    General: Skin is warm.  Neurological:     General: No focal deficit present.     Mental Status: She is alert and oriented to person, place, and time.     Comments: Just very weak     Labs reviewed: Recent Labs    01/26/20 0446 01/29/20 0000 03/08/20 0000 04/01/20 0000 05/24/20 1741 05/24/20 1756 05/26/20 0322 05/26/20 0828 06/08/20 0000 06/25/20 0000  NA 130*   < > 137   < > 143 142 142  --  142 141  K 3.6   < > 4.0   < > 3.6 3.5 3.2*  --  3.8 3.6  CL 101   < > 97*   < > 102 102 102  --  100 100  CO2 22   < > 37*   < > 29  --  31  --  32* 31*  GLUCOSE 109*  --   --   --  117* 113* 102*  --   --   --   BUN 15   < > 24*   < > 26* 29* 20  --  28* 30*  CREATININE 0.66   < > 0.6   < > 0.68 0.60 0.60  --  0.6 0.6  CALCIUM 7.7*   < > 8.5*   < > 8.8*  --  8.9  --  8.6*  8.7  MG 1.8  --  1.6  --   --   --   --  2.0  --   --    < > = values in this interval not displayed.   Recent Labs    01/15/20 0840 01/18/20 1302 02/12/20 0000 05/24/20 1741 06/08/20 0000 06/25/20 0000  AST 12 14*   < > 20 13 13   ALT 7 10   < > 17 9 7   ALKPHOS  --  47   < > 63 57 51   BILITOT 0.7 1.0  --  0.7  --   --   PROT 5.9* 5.8*  --  6.3*  --   --   ALBUMIN  --  3.0*   < > 2.9* 3.0* 2.9*   < > = values in this interval not displayed.   Recent Labs    01/26/20 0446 01/29/20 0000 05/24/20 1741 05/24/20 1756 05/26/20 0823 06/08/20 0000 06/25/20 0000  WBC 10.5   < > 11.7*  --  10.5 8.0 6.4  NEUTROABS 8.6*   < > 9.5*  --  8.6*  --  4,749.00  HGB 10.0*   < > 10.3*   < > 10.6* 11.0* 9.8*  HCT 31.6*   < > 35.1*   < > 35.1* 36 32*  MCV 86.8  --  88.4  --  88.4  --   --   PLT 255   < > 265  --  279 306 301   < > = values in this interval not displayed.   Lab Results  Component Value Date   TSH 2.095 05/25/2020   Lab Results  Component Value Date   HGBA1C 5.4 05/25/2020   Lab Results  Component Value Date   CHOL 133 05/25/2020   HDL 39 (L) 05/25/2020   LDLCALC 79 05/25/2020   TRIG 73 05/25/2020   CHOLHDL 3.4 05/25/2020    Significant Diagnostic Results in last 30 days:  No results found.  Assessment/Plan C. difficile colitis.  With Leucocytosis White Count more then 17 k Family Refused for Dificid due to cost Will treat with Oral Vanco 125 mg QID for 2 weeks Contact Isolation Bilateral Infiltrate on Chest Xray She is Asymptomatic Will try to avoid Antibiotics  Dehydration BUN and Creat are 40/1.31 Discontinue Demadex for now Atrial fibrillation with rapid ventricular response (HCC) Continue Eliquis, Lopressor and Cardizem Anemia, unspecified type On iron Cerebrovascular accident (CVA) due to embolism of left middle cerebral artery (HCC) On Eliquis, statin and therapy   Hyperlipidemia,  Continue Lipitor Last LDL was 79 Venous stasis ulcer of left lower leg  Getting Follow up with Wound care Depression with Weight loss Has been doing well with Remeron  Family/ staff Communication:   Labs/tests ordered:  CBC,BMP in 1 week Total time spent in this patient care encounter was  45_  minutes; greater than 50% of the visit spent  counseling patient and staff, reviewing records , Labs and coordinating care for problems addressed at this encounter.

## 2020-08-06 ENCOUNTER — Encounter: Payer: Self-pay | Admitting: Nurse Practitioner

## 2020-08-06 DIAGNOSIS — A0472 Enterocolitis due to Clostridium difficile, not specified as recurrent: Secondary | ICD-10-CM | POA: Insufficient documentation

## 2020-08-13 LAB — BASIC METABOLIC PANEL
BUN: 56 — AB (ref 4–21)
CO2: 28 — AB (ref 13–22)
Chloride: 99 (ref 99–108)
Creatinine: 1.2 — AB (ref 0.5–1.1)
Glucose: 98
Potassium: 3.9 (ref 3.4–5.3)
Sodium: 136 — AB (ref 137–147)

## 2020-08-13 LAB — CBC AND DIFFERENTIAL
HCT: 39 (ref 36–46)
Hemoglobin: 12.4 (ref 12.0–16.0)
Platelets: 220 (ref 150–399)
WBC: 15.3

## 2020-08-13 LAB — COMPREHENSIVE METABOLIC PANEL
Calcium: 7.3 — AB (ref 8.7–10.7)
GFR calc Af Amer: 43
GFR calc non Af Amer: 37

## 2020-08-13 LAB — CBC: RBC: 4.67 (ref 3.87–5.11)

## 2020-08-16 ENCOUNTER — Encounter: Payer: Self-pay | Admitting: Nurse Practitioner

## 2020-08-16 ENCOUNTER — Non-Acute Institutional Stay (SKILLED_NURSING_FACILITY): Payer: Medicare Other | Admitting: Nurse Practitioner

## 2020-08-16 DIAGNOSIS — A0472 Enterocolitis due to Clostridium difficile, not specified as recurrent: Secondary | ICD-10-CM | POA: Diagnosis not present

## 2020-08-16 DIAGNOSIS — I4891 Unspecified atrial fibrillation: Secondary | ICD-10-CM | POA: Diagnosis not present

## 2020-08-16 DIAGNOSIS — N179 Acute kidney failure, unspecified: Secondary | ICD-10-CM

## 2020-08-16 DIAGNOSIS — R634 Abnormal weight loss: Secondary | ICD-10-CM

## 2020-08-16 DIAGNOSIS — I63412 Cerebral infarction due to embolism of left middle cerebral artery: Secondary | ICD-10-CM

## 2020-08-16 DIAGNOSIS — D649 Anemia, unspecified: Secondary | ICD-10-CM

## 2020-08-16 DIAGNOSIS — I872 Venous insufficiency (chronic) (peripheral): Secondary | ICD-10-CM

## 2020-08-16 NOTE — Assessment & Plan Note (Signed)
Afib, takes Metoprolol, Diltiazem, on Eliquis since Ischemic left MCA stroke MRI, in hospital 05/24/20-05/26/20  

## 2020-08-16 NOTE — Assessment & Plan Note (Signed)
elevated Bun/creat 56/1.24, wbc 15.3  08/12/20 due to C-diff colitis, 08/13/20 Torsemide/Kcl on hole, pending BMP, encourage oral fluid intake.

## 2020-08-16 NOTE — Assessment & Plan Note (Signed)
Mood/weight, takes Mirtazapine, TSH 2.095 05/25/20 

## 2020-08-16 NOTE — Assessment & Plan Note (Signed)
C-diff colitis wbc is trended down from 17s to 15s, on oral Vanco 125mg  bid x 2 weeks since 08/05/20

## 2020-08-16 NOTE — Assessment & Plan Note (Signed)
Stroke MRI, in hospital 05/24/20-05/26/20, restarted Eliquis. Saw neurology 06/28/20 

## 2020-08-16 NOTE — Progress Notes (Signed)
Location:    [place] Room Number: 9 Place of Service:  SNF (31) Provider: Marlana Latus NP  Riddik Senna X, NP  Patient Care Team: Kalayla Shadden X, NP as PCP - General (Internal Medicine) [place] Netta Cedars, MD as Consulting Physician (Orthopedic Surgery) Luberta Mutter, MD as Consulting Physician (Ophthalmology) Sydnee Levans, MD as Consulting Physician (Dermatology) Adrian Prows, MD as Consulting Physician (Cardiology)  Extended Emergency Contact Information Primary Emergency Contact: [facility] Address: [address]          [address] [place] [address] [name] of [place] Phone: [phone] Mobile Phone: [phone] Relation: Daughter Secondary Emergency Contact: Edel, [place] Mobile Phone: [phone] Relation: Son  Code Status:  DNR Goals of care: Advanced Directive information Advanced Directives 07/23/2020  Does Patient Have a Medical Advance Directive? Yes  Type of Advance Directive Living will;Healthcare Power of [place];Out of facility DNR (pink MOST or yellow form)  Does patient want to make changes to medical advance directive? No - Patient declined  Copy of [place] in Chart? Yes - validated most recent copy scanned in chart (See row information)  Would patient like information on creating a medical advance directive? -  Pre-existing out of facility DNR order (yellow form or pink MOST form) Yellow form placed in chart (order not valid for inpatient use)     Chief Complaint  Patient presents with   Acute Visit    Acute renal injury, leukocytosis    HPI:  Pt is a 86 y.o. female seen today for an acute visit for elevated Bun/creat 56/1.24, wbc 15.3  08/12/20 due to C-diff colitis, 08/13/20 Torsemide/Kcl on hole, pending BMP, encourage oral fluid intake.   C-diff colitis wbc is trended down from 17s to 15s, on oral Vanco 125mg  bid x 2 weeks since 08/05/20, no diarrhea  since 08/14/20  Anemia, takes Fe, Vit B12. Vit B12 311, Hgb12.4 08/13/20 Afib, takes Metoprolol, Diltiazem,onEliquis since Ischemic left MCA stroke MRI, in hospital 05/24/20-05/26/20 BLE edema, none, held Torsemide qod. Bun/creat 30/0.6 06/25/20. 56/1.24 08/12/20 Mood/weight, takes Mirtazapine, TSH 2.095 05/25/20  Sacrococcygeal pressure ulcer, f/u wound care center, covered in dressing during my visit today.   Stroke MRI, in hospital 05/24/20-05/26/20, restarted Eliquis. 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 Norris MD   CATARACT EXTRACTION W/ INTRAOCULAR LENS IMPLANT Right 2010   CATARACT EXTRACTION W/ INTRAOCULAR LENS IMPLANT Left 10/2012   DILATION AND CURETTAGE OF UTERUS     EYE SURGERY Right 07/2009   cataract extraction/IOLI [name], MD   KNEE ARTHROSCOPY Right 2008   Torn  meniscus, Dr. Veverly Fells    Allergies  Allergen Reactions   Lomotil [Diphenoxylate] Nausea And Vomiting   Sulfa Antibiotics Other (See Comments)    Pt unknown    Amlodipine Swelling     Allergies as of 08/16/2020      Reactions   Lomotil [diphenoxylate] Nausea And Vomiting   Sulfa Antibiotics Other (See Comments)   Pt unknown    Amlodipine Swelling      Medication List       Accurate as of August 16, 2020 11:59 PM. If you have any questions, ask your nurse or doctor.        acetaminophen 325 MG tablet Commonly known as: TYLENOL Take 2 tablets (650 mg total) by mouth every 4 (four) hours as needed for mild pain (or temp > 37.5 C (99.5 F)).   apixaban 2.5 MG Tabs tablet Commonly known as: ELIQUIS Take 1 tablet (2.5 mg total) by mouth 2 (two) times daily.   ARGINAID EXTRA PO Take 237 mLs by mouth daily.   Artificial Tears 0.1-0.3 % Soln Generic drug: Dextran 70-Hypromellose Apply 1 drop to eye in the morning, at noon, in the evening, and at bedtime. Both eyes   atorvastatin 20 MG tablet Commonly known as: LIPITOR Take 1 tablet (20 mg total) by mouth daily.   diltiazem 120 MG 24 hr capsule Commonly known as: CARDIZEM CD Take 1 capsule (120 mg total) by mouth daily.   feeding supplement (PRO-STAT SUGAR FREE 64) Liqd Take 30 mLs by mouth 2 (two) times daily with a meal.   ferrous sulfate 325 (65 FE) MG tablet Take 325 mg by mouth. Once a day on Sunday, Tuesday, Thursday and Saturday   Firvanq 25 MG/ML Solr Generic drug: Vancomycin HCl Take 5 mLs by mouth every 6 (six) hours.   loperamide 2 MG capsule Commonly known as: IMODIUM Take 1 capsule (2 mg total) by mouth every 6 (six) hours as needed for diarrhea or loose stools.   magnesium oxide 400 MG tablet Commonly known as: MAG-OX Take 400 mg by mouth daily.   metoprolol tartrate 50 MG tablet Commonly known as: LOPRESSOR Take 75 mg by mouth 2 (two) times daily.   mirtazapine 7.5 MG tablet Commonly known as: REMERON Take 7.5 mg by mouth every other day.   multivitamin-lutein Caps capsule Take 1 capsule by mouth in the morning and at bedtime.   OXYGEN Inhale 2 L into the lungs as  needed (Maintain O2 sat greater than or equal to 90%).   saccharomyces boulardii 250 MG capsule Commonly known as: FLORASTOR Take 250 mg by mouth 2 (two) times daily.   triamcinolone 0.1 % Commonly known as: KENALOG Apply 1 application topically every other day. Once a day. Apply to legs with esch dressing change.   vitamin B-12 1000 MCG tablet Commonly known as: CYANOCOBALAMIN Take 1,000 mcg by mouth daily.   zinc oxide 20 % ointment Apply 1 application topically as needed for irritation.       Review of Systems  Constitutional: Positive for appetite change and fatigue. Negative for fever.       #3-4Ibs weight loss in the past month.   HENT: Positive for hearing loss. Negative for congestion and trouble swallowing.   Eyes: Negative for visual disturbance.  Respiratory: Negative for cough, shortness of breath and wheezing.        Chronic DOE  Cardiovascular: Negative for chest pain, palpitations and leg swelling.  Gastrointestinal: Negative for abdominal pain, diarrhea, nausea and vomiting.  Genitourinary: Negative for dysuria and urgency.  Musculoskeletal: Positive for gait problem.  Skin: Positive for wound. Negative for color change.  Neurological: Negative for speech difficulty, weakness, light-headedness and headaches.  Psychiatric/Behavioral: Negative for confusion and sleep disturbance. The patient is not nervous/anxious.     Immunization History  Administered Date(s) Administered   Fluad Quad(high Dose 65+) 05/26/2020   Influenza Whole 08/21/2010, 05/21/2012   Influenza, High Dose Seasonal PF 05/30/2017, 06/04/2019   Influenza,inj,Quad PF,6+ Mos 05/23/2018   Influenza-Unspecified 06/04/2014, 05/20/2015, 06/08/2016   Moderna Sars-Covid-2 Vaccination 08/25/2019, 09/22/2019   Pneumococcal Conjugate-13 08/22/1999   Td 08/22/1999   Zoster 08/21/2005   Pertinent  Health Maintenance Due  Topic Date Due   DEXA SCAN  Never done   PNA vac Low Risk Adult (2  of 2 - PPSV23) 08/21/2000   INFLUENZA VACCINE  Completed   Fall Risk  07/23/2019 01/15/2019 07/17/2018 06/04/2018 09/12/2017  Falls in the past year? 0 0 0 No No  Number falls in past yr: 0 0 0 - -  Injury with Fall? 0 0 0 - -   Functional Status Survey:    Vitals:   08/16/20 1055  BP: (!) 90/56  Pulse: 74  Resp: 20  Temp: (!) 97.2 F (36.2 C)  SpO2: 96%  Weight: 96 lb (43.5 kg)  Height: 5\' 2"  (1.575 m)   Body mass index is 17.56 kg/m. Physical Exam Vitals and nursing note reviewed.  Constitutional:      Comments: Tired than usual  HENT:     Head: Normocephalic and atraumatic.     Nose: Nose normal.     Mouth/Throat:     Mouth: Mucous membranes are dry.  Eyes:     Extraocular Movements: Extraocular movements intact.     Conjunctiva/sclera: Conjunctivae normal.     Pupils: Pupils are equal, round, and reactive to light.     Comments: Crusted eyelashes, left eye low vision  Cardiovascular:     Rate and Rhythm: Tachycardia present. Rhythm irregular.     Heart sounds: No murmur heard.     Comments: Weak DP pulses.  Pulmonary:     Effort: Pulmonary effort is normal.     Breath sounds: No rales.  Abdominal:     General: Bowel sounds are normal. There is no distension.     Palpations: Abdomen is soft.     Tenderness: There is no abdominal tenderness. There is no right CVA tenderness, left CVA tenderness, guarding or rebound.  Musculoskeletal:     Cervical back: Normal range of motion and neck supple.     Right lower leg: No edema.     Left lower leg: No edema.  Skin:    General: Skin is warm and dry.     Comments: sacral pressure wound(stage III) per reported, the area is covered in dressing during my examination today. Also BLE are wrapped in dressing.    Neurological:     General: No focal deficit present.     Mental Status: She is alert and oriented to person, place, and time. Mental status is at baseline.     Gait: Gait abnormal.  Psychiatric:        Mood and  Affect: Mood normal.        Behavior: Behavior normal.        Thought Content: Thought content normal.        Judgment: Judgment normal.     Comments: Appears tired.      Labs reviewed: Recent Labs  01/26/20 0446 01/29/20 0000 03/08/20 0000 04/01/20 0000 05/24/20 1741 05/24/20 1756 05/26/20 0322 05/26/20 0828 06/08/20 0000 06/25/20 0000 08/04/20 0000  NA 130*   < > 137   < > 143 142 142  --  142 141 135*  K 3.6   < > 4.0   < > 3.6 3.5 3.2*  --  3.8 3.6 4.0  CL 101   < > 97*   < > 102 102 102  --  100 100 100  CO2 22   < > 37*   < > 29  --  31  --  32* 31*  --   GLUCOSE 109*  --   --   --  117* 113* 102*  --   --   --   --   BUN 15   < > 24*   < > 26* 29* 20  --  28* 30* 40*  CREATININE 0.66   < > 0.6   < > 0.68 0.60 0.60  --  0.6 0.6 1.3*  CALCIUM 7.7*   < > 8.5*   < > 8.8*  --  8.9  --  8.6* 8.7 8.1*  MG 1.8  --  1.6  --   --   --   --  2.0  --   --   --    < > = values in this interval not displayed.   Recent Labs    01/15/20 0840 01/18/20 1302 02/12/20 0000 05/24/20 1741 06/08/20 0000 06/25/20 0000 08/04/20 0000  AST 12 14*   < > 20 13 13  11*  ALT 7 10   < > 17 9 7  6*  ALKPHOS  --  47   < > 63 57 51 54  BILITOT 0.7 1.0  --  0.7  --   --   --   PROT 5.9* 5.8*  --  6.3*  --   --   --   ALBUMIN  --  3.0*   < > 2.9* 3.0* 2.9* 2.5*   < > = values in this interval not displayed.   Recent Labs    01/26/20 0446 01/29/20 0000 05/24/20 1741 05/24/20 1756 05/26/20 0823 06/08/20 0000 06/25/20 0000 08/04/20 0000  WBC 10.5   < > 11.7*  --  10.5 8.0 6.4 17.0  NEUTROABS 8.6*   < > 9.5*  --  8.6*  --  4,749.00 14,739.00  HGB 10.0*   < > 10.3*   < > 10.6* 11.0* 9.8* 11.3*  HCT 31.6*   < > 35.1*   < > 35.1* 36 32* 35*  MCV 86.8  --  88.4  --  88.4  --   --   --   PLT 255   < > 265  --  279 306 301 382   < > = values in this interval not displayed.   Lab Results  Component Value Date   TSH 2.095 05/25/2020   Lab Results  Component Value Date   HGBA1C 5.4  05/25/2020   Lab Results  Component Value Date   CHOL 133 05/25/2020   HDL 39 (L) 05/25/2020   LDLCALC 79 05/25/2020   TRIG 73 05/25/2020   CHOLHDL 3.4 05/25/2020    Significant Diagnostic Results in last 30 days:  No results found.  Assessment/Plan Acute renal injury ([place])  elevated Bun/creat 56/1.24, wbc 15.3  08/12/20 due to C-diff colitis, 08/13/20 Torsemide/Kcl on hole, pending BMP, encourage oral fluid intake.   C. difficile  colitis C-diff colitis wbc is trended down from 17s to 15s, on oral Vanco 125mg  bid x 2 weeks since 08/05/20  Anemia Anemia, takes Fe, Vit B12. Vit B12 311, Hgb12.4 08/13/20, Hgb 9.8 06/24/20, hemoconcentration is contributory.    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 Bun/creat 30/0.6 06/25/20. 56/1.24 08/12/20   Weight loss Mood/weight, takes Mirtazapine, TSH 2.095 05/25/20   Stroke [facility]) Stroke MRI, in hospital 05/24/20-05/26/20, restarted Eliquis. Saw neurology 06/28/20    Family/ staff Communication: plan of care reviewed with the patient and charge nurse.   Labs/tests ordered: pending BMP   Time spend 35 minutes.

## 2020-08-16 NOTE — Assessment & Plan Note (Signed)
BLE edema, none, held Torsemide qod. Bun/creat 30/0.6 06/25/20. 56/1.24 08/12/20

## 2020-08-16 NOTE — Assessment & Plan Note (Signed)
Anemia, takes Fe, Vit B12. Vit B12 311, Hgb12.4 08/13/20, Hgb 9.8 06/24/20, hemoconcentration is contributory.

## 2020-08-19 ENCOUNTER — Non-Acute Institutional Stay (SKILLED_NURSING_FACILITY): Payer: Medicare Other | Admitting: Internal Medicine

## 2020-08-19 ENCOUNTER — Encounter: Payer: Self-pay | Admitting: Internal Medicine

## 2020-08-19 DIAGNOSIS — I4891 Unspecified atrial fibrillation: Secondary | ICD-10-CM | POA: Diagnosis not present

## 2020-08-19 DIAGNOSIS — D649 Anemia, unspecified: Secondary | ICD-10-CM

## 2020-08-19 DIAGNOSIS — I63412 Cerebral infarction due to embolism of left middle cerebral artery: Secondary | ICD-10-CM

## 2020-08-19 DIAGNOSIS — N179 Acute kidney failure, unspecified: Secondary | ICD-10-CM

## 2020-08-19 DIAGNOSIS — A0472 Enterocolitis due to Clostridium difficile, not specified as recurrent: Secondary | ICD-10-CM

## 2020-08-19 DIAGNOSIS — L97209 Non-pressure chronic ulcer of unspecified calf with unspecified severity: Secondary | ICD-10-CM

## 2020-08-19 DIAGNOSIS — I83002 Varicose veins of unspecified lower extremity with ulcer of calf: Secondary | ICD-10-CM

## 2020-08-19 DIAGNOSIS — E785 Hyperlipidemia, unspecified: Secondary | ICD-10-CM

## 2020-08-19 LAB — CBC AND DIFFERENTIAL
HCT: 36 (ref 36–46)
Hemoglobin: 11.2 — AB (ref 12.0–16.0)
Platelets: 249 (ref 150–399)
WBC: 10.9

## 2020-08-19 LAB — BASIC METABOLIC PANEL
BUN: 38 — AB (ref 4–21)
CO2: 33 — AB (ref 13–22)
Chloride: 99 (ref 99–108)
Creatinine: 0.8 (ref 0.5–1.1)
Glucose: 75
Potassium: 3.4 (ref 3.4–5.3)
Sodium: 137 (ref 137–147)

## 2020-08-19 LAB — CBC: RBC: 4.27 (ref 3.87–5.11)

## 2020-08-19 LAB — COMPREHENSIVE METABOLIC PANEL: Calcium: 7.6 — AB (ref 8.7–10.7)

## 2020-08-19 NOTE — Progress Notes (Signed)
Location:  [facility] Room Number: [place] of Service:  SNF [phone]) Provider:  Veleta Miners, MD  [name], [name], NP  Patient Care Team: [name], [name], NP as PCP - General (Internal Medicine) Melina Modena, [facility] Netta Cedars, MD as Consulting Physician (Orthopedic Surgery) Luberta Mutter, MD as Consulting Physician (Ophthalmology) Sydnee Levans, MD as Consulting Physician (Dermatology) Adrian Prows, MD as Consulting Physician (Cardiology)  Extended Emergency Contact Information Primary Emergency Contact: [facility] Address: [address]          [address] [place] [address] [name] of [place] Phone: [phone] Mobile Phone: [phone] Relation: Daughter Secondary Emergency Contact: Fabian, Almendinger Mobile Phone: [phone] Relation: Son  Code Status:  DNR Goals of care: Advanced Directive information Advanced Directives 07/23/2020  Does Patient Have a Medical Advance Directive? Yes  Type of Advance Directive Living will;Healthcare Power of [place];Out of facility DNR (pink MOST or yellow form)  Does patient want to make changes to medical advance directive? No - Patient declined  Copy of [place] in Chart? Yes - validated most recent copy scanned in chart (See row information)  Would patient like information on creating a medical advance directive? -  Pre-existing out of facility DNR order (yellow form or pink MOST form) Yellow form placed in chart (order not valid for inpatient use)     Chief Complaint  Patient presents with  . Acute Visit    Patient is seen for C. Difficile colitis    HPI:  Pt is a 86 y.o. female seen today for an acute visit for C Diff Colitis   Patient has h/o Hypertension, Hyperlipidemia, Osteoarthritis, Unstable Gait Wheelchair Dependent,h/o Vaginal Atropy, Macular DegenerationAnd h/o A Fib and Chronic Sacral Wound and LE wounds Was admitted in the Hospital from  10/04-10/06 for Acute Left MCA stroke Due to Emboli  Patient was recently diagnosed with C Diff colitis when she had leucocytosis, Diarrhea and lower abdominal pain She was started on Oral Vanco. Today is her last day 14 days Per Nurses she did have one loose stool yesterday but overall no fever. No Pain. Appetite is good Feels much better Denies any Cough or Chest pain or SOB   Past Medical History:  Diagnosis Date  . Abnormal uterine bleeding   . Actinic keratosis 02/13/2012  . Acute bronchospasm 10/31/2011  . Cancer ([place]) 04/2008   Skin cancer of low back [name], MD  . Cataract   . Disorder of bone and cartilage, unspecified 02/18/2007  . Dizziness and giddiness 08/30/2010  . Dysmenorrhea   . Edema 12/19/2011  . Endometriosis   . 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 Osteoarthrosis, unspecified whether generalized or localized, unspecified site 08/20/2006  . Osteoporosis   . Other abnormal blood chemistry 03/14/2011  . Other and unspecified hyperlipidemia 10/18/2010  . Palpitations 02/13/2012  . Postmenopausal bleeding 08/30/2010  . Shortness of breath 11/28/2011  . Supraventricular premature beats 05/23/2011  . Unspecified essential hypertension 08/20/2006   Past Surgical History:  Procedure Laterality Date  . CARPAL TUNNEL RELEASE  2004   S. Norris MD  . CATARACT EXTRACTION W/ INTRAOCULAR LENS IMPLANT Right 2010  . CATARACT EXTRACTION W/ INTRAOCULAR LENS IMPLANT Left 10/2012  . DILATION AND CURETTAGE OF UTERUS    . EYE SURGERY Right 07/2009   cataract extraction/IOLI [name], MD  . KNEE ARTHROSCOPY Right 2008   Torn meniscus, Dr. Veverly Fells    Allergies  Allergen Reactions  . Lomotil [Diphenoxylate] Nausea And Vomiting  . Sulfa Antibiotics Other (See Comments)    Pt unknown   . Amlodipine Swelling    Outpatient Encounter Medications as of 08/19/2020  Medication Sig  . acetaminophen (TYLENOL) 325 MG tablet Take 2 tablets (650 mg total) by mouth every 4  (four) hours as needed for mild pain (or temp > 37.5 C (99.5 F)).  . Amino Acids-Protein Hydrolys (FEEDING SUPPLEMENT, PRO-STAT SUGAR FREE 64,) LIQD Take 30 mLs by mouth 2 (two) times daily with a meal.   . apixaban (ELIQUIS) 2.5 MG TABS tablet Take 1 tablet (2.5 mg total) by mouth 2 (two) times daily.  [name] atorvastatin (LIPITOR) 20 MG tablet Take 1 tablet (20 mg total) by mouth daily.  [name] Dextran 70-Hypromellose (ARTIFICIAL TEARS) 0.1-0.3 % SOLN Apply 1 drop to eye in the morning, at noon, in the evening, and at bedtime. Both eyes  . diltiazem (CARDIZEM CD) 120 MG 24 hr capsule Take 1 capsule (120 mg total) by mouth daily.  . ferrous sulfate 325 (65 FE) MG tablet Take 325 mg by mouth. Once a day on Sunday, Tuesday, Thursday and Saturday  . loperamide (IMODIUM) 2 MG capsule Take 1 capsule (2 mg total) by mouth every 6 (six) hours as needed for diarrhea or loose stools.  . magnesium oxide (MAG-OX) 400 MG tablet Take 400 mg by mouth daily.  . metoprolol tartrate (LOPRESSOR) 50 MG tablet Take 75 mg by mouth 2 (two) times daily.  . mirtazapine (REMERON) 7.5 MG tablet Take 7.5 mg by mouth every other day.   . multivitamin-lutein (OCUVITE-LUTEIN) CAPS capsule Take 1 capsule by mouth in the morning and at bedtime.   . OXYGEN Inhale 2 L into the lungs as needed (Maintain O2 sat greater than or equal to 90%).  [name] saccharomyces boulardii (FLORASTOR) 250 MG capsule Take 250 mg by mouth 2 (two) times daily.  [name] triamcinolone cream (KENALOG) 0.1 % Apply 1 application topically every other day. Once a day. Apply to legs with esch dressing change.  . Vancomycin HCl (FIRVANQ) 25 MG/ML SOLR Take 5 mLs by mouth every 6 (six) hours.  . vitamin B-12 (CYANOCOBALAMIN) 1000 MCG tablet Take 1,000 mcg by mouth daily.  [name] zinc oxide 20 % ointment Apply 1 application topically as needed for irritation.  . [DISCONTINUED] Nutritional Supplements (ARGINAID EXTRA PO) Take 237 mLs by mouth daily.   No facility-administered encounter  medications on file as of 08/19/2020.    Review of Systems  Constitutional: Negative.   HENT: Negative.   Respiratory: Negative.   Cardiovascular: Negative.   Gastrointestinal: Negative.   Genitourinary: Negative.   Musculoskeletal: Positive for gait problem.  Skin: Positive for wound.  Neurological: Positive for weakness.  Psychiatric/Behavioral: Negative.     Immunization History  Administered Date(s) Administered  . Fluad Quad(high Dose 65+) 05/26/2020  . Influenza Whole 08/21/2010, 05/21/2012  . Influenza, High Dose Seasonal PF 05/30/2017, 06/04/2019  . Influenza,inj,Quad PF,6+ Mos 05/23/2018  . Influenza-Unspecified 05/20/2015, 06/08/2016, 05/26/2020  . Moderna Sars-Covid-2 Vaccination 08/25/2019, 09/22/2019, 07/05/2020  . Pneumococcal Conjugate-13 08/22/1999  . Td 08/22/1999  . Zoster 08/21/2005   Pertinent  Health Maintenance Due  Topic Date Due  . DEXA SCAN  Never done  . PNA vac Low Risk Adult (2 of 2 - PPSV23) 08/21/2000  . INFLUENZA VACCINE  Completed   Fall Risk  07/23/2019 01/15/2019 07/17/2018 06/04/2018 09/12/2017  Falls in the past year? 0 0 0 No No  Number falls in  past yr: 0 0 0 - -  Injury with Fall? 0 0 0 - -   Functional Status Survey:    Vitals:   08/19/20 1105  BP: 108/76  Pulse: 75  Resp: 18  Temp: 97.8 F (36.6 C)  TempSrc: Oral  SpO2: 94%  Weight: 96 lb (43.5 kg)  Height: 5\' 2"  (1.575 m)   Body mass index is 17.56 kg/m. Physical Exam  Constitutional: Oriented to person, place, and time. Well-developed .  HENT:  Head: Normocephalic.  Mouth/Throat: Oropharynx is clear and moist.  Eyes: Pupils are equal, round, and reactive to light.  Neck: Neck supple.  Cardiovascular: Normal rate and normal heart sounds.  No murmur heard. Pulmonary/Chest: Effort normal and breath sounds normal. No respiratory distress. No wheezes. She has no rales.  Abdominal: Soft. Bowel sounds are normal. No distension. There is no tenderness. There is no  rebound.  Musculoskeletal: Has Dressings in her legs Lymphadenopathy: none Neurological: Alert and oriented to person, place, and time.  Skin: Skin is warm and dry.  Psychiatric: Normal mood and affect. Behavior is normal. Thought content normal.    Labs reviewed: Recent Labs    01/26/20 0446 01/29/20 0000 03/08/20 0000 04/01/20 0000 05/24/20 1741 05/24/20 1756 05/26/20 0322 05/26/20 0828 06/08/20 0000 06/25/20 0000 08/04/20 0000 08/13/20 0000  NA 130*   < > 137   < > 143 142 142  --  142 141 135* 136*  K 3.6   < > 4.0   < > 3.6 3.5 3.2*  --  3.8 3.6 4.0 3.9  CL 101   < > 97*   < > 102 102 102  --  100 100 100 99  CO2 22   < > 37*   < > 29  --  31  --  32* 31*  --  28*  GLUCOSE 109*  --   --   --  117* 113* 102*  --   --   --   --   --   BUN 15   < > 24*   < > 26* 29* 20  --  28* 30* 40* 56*  CREATININE 0.66   < > 0.6   < > 0.68 0.60 0.60  --  0.6 0.6 1.3* 1.2*  CALCIUM 7.7*   < > 8.5*   < > 8.8*  --  8.9  --  8.6* 8.7 8.1* 7.3*  MG 1.8  --  1.6  --   --   --   --  2.0  --   --   --   --    < > = values in this interval not displayed.   Recent Labs    01/15/20 0840 01/18/20 1302 02/12/20 0000 05/24/20 1741 06/08/20 0000 06/25/20 0000 08/04/20 0000  AST 12 14*   < > 20 13 13  11*  ALT 7 10   < > 17 9 7  6*  ALKPHOS  --  47   < > 63 57 51 54  BILITOT 0.7 1.0  --  0.7  --   --   --   PROT 5.9* 5.8*  --  6.3*  --   --   --   ALBUMIN  --  3.0*   < > 2.9* 3.0* 2.9* 2.5*   < > = values in this interval not displayed.   Recent Labs    01/26/20 0446 01/29/20 0000 05/24/20 1741 05/24/20 1756 05/26/20 0823 06/08/20 0000 06/25/20 0000 08/04/20 0000 08/13/20  0000  WBC 10.5   < > 11.7*  --  10.5   < > 6.4 17.0 15.3  NEUTROABS 8.6*   < > 9.5*  --  8.6*  --  4,749.00 14,739.00  --   HGB 10.0*   < > 10.3*   < > 10.6*   < > 9.8* 11.3* 12.4  HCT 31.6*   < > 35.1*   < > 35.1*   < > 32* 35* 39  MCV 86.8  --  88.4  --  88.4  --   --   --   --   PLT 255   < > 265  --  279    < > 301 382 220   < > = values in this interval not displayed.   Lab Results  Component Value Date   TSH 2.095 05/25/2020   Lab Results  Component Value Date   HGBA1C 5.4 05/25/2020   Lab Results  Component Value Date   CHOL 133 05/25/2020   HDL 39 (L) 05/25/2020   LDLCALC 79 05/25/2020   TRIG 73 05/25/2020   CHOLHDL 3.4 05/25/2020    Significant Diagnostic Results in last 30 days:  No results found.  Assessment/Plan C. difficile colitis Doing better clinically One episode of diarrhea Will continue on Vanco for 1 more week with total duratoin of 3 weeks Repeat CBC  Acute renal injury ([place]) Diuretics Discontinued Repeat BMP Anemia, unspecified type Hgb good levels On iron Atrial fibrillation with rapid ventricular response (HCC) On Eliquis and high doe of Lopressor and Cardizem Cerebrovascular accident (CVA) due to embolism of left middle cerebral artery ([place]) Had CVA off eliquis No back on eliquis and Lipitor Venous stasis ulcer of calf, unspecified laterality, unspecified ulcer stage, unspecified whether varicose veins present ([place]) Follows with Wound care Hyperlipidemia, unspecified hyperlipidemia type continue on lipitor   Family/ staff Communication:   Labs/tests ordered:  CBC and CMP  Total time spent in this patient care encounter was  45_  minutes; greater than 50% of the visit spent counseling patient and staff, reviewing records , Labs and coordinating care for problems addressed at this encounter.

## 2020-08-23 LAB — CBC AND DIFFERENTIAL
HCT: 35 — AB (ref 36–46)
Hemoglobin: 10.9 — AB (ref 12.0–16.0)
Neutrophils Absolute: 6091
Platelets: 254 (ref 150–399)
WBC: 8.1

## 2020-08-23 LAB — BASIC METABOLIC PANEL
BUN: 31 — AB (ref 4–21)
CO2: 32 — AB (ref 13–22)
Chloride: 100 (ref 99–108)
Creatinine: 0.7 (ref 0.5–1.1)
Glucose: 83
Potassium: 3.7 (ref 3.4–5.3)
Sodium: 139 (ref 137–147)

## 2020-08-23 LAB — CBC: RBC: 4.25 (ref 3.87–5.11)

## 2020-08-23 LAB — COMPREHENSIVE METABOLIC PANEL: Calcium: 7.6 — AB (ref 8.7–10.7)

## 2020-08-24 ENCOUNTER — Encounter (HOSPITAL_BASED_OUTPATIENT_CLINIC_OR_DEPARTMENT_OTHER): Payer: Medicare Other | Admitting: Internal Medicine

## 2020-08-25 ENCOUNTER — Encounter: Payer: Self-pay | Admitting: Internal Medicine

## 2020-08-25 ENCOUNTER — Non-Acute Institutional Stay (SKILLED_NURSING_FACILITY): Payer: Medicare Other | Admitting: Internal Medicine

## 2020-08-25 DIAGNOSIS — N179 Acute kidney failure, unspecified: Secondary | ICD-10-CM

## 2020-08-25 DIAGNOSIS — D649 Anemia, unspecified: Secondary | ICD-10-CM | POA: Diagnosis not present

## 2020-08-25 DIAGNOSIS — L97209 Non-pressure chronic ulcer of unspecified calf with unspecified severity: Secondary | ICD-10-CM

## 2020-08-25 DIAGNOSIS — I63412 Cerebral infarction due to embolism of left middle cerebral artery: Secondary | ICD-10-CM

## 2020-08-25 DIAGNOSIS — A0472 Enterocolitis due to Clostridium difficile, not specified as recurrent: Secondary | ICD-10-CM

## 2020-08-25 DIAGNOSIS — I4891 Unspecified atrial fibrillation: Secondary | ICD-10-CM | POA: Diagnosis not present

## 2020-08-25 DIAGNOSIS — I83002 Varicose veins of unspecified lower extremity with ulcer of calf: Secondary | ICD-10-CM

## 2020-08-25 DIAGNOSIS — E785 Hyperlipidemia, unspecified: Secondary | ICD-10-CM

## 2020-08-25 NOTE — Progress Notes (Addendum)
Location:    [facility] Room Number: 9 Place of Service:  SNF [phone]) Provider:  Veleta Miners MD  [name], [name], NP  Patient Care Team: [name], [name], NP as PCP - General (Internal Medicine) Melina Modena, [facility] Netta Cedars, MD as Consulting Physician (Orthopedic Surgery) Luberta Mutter, MD as Consulting Physician (Ophthalmology) Sydnee Levans, MD as Consulting Physician (Dermatology) Adrian Prows, MD as Consulting Physician (Cardiology)  Extended Emergency Contact Information Primary Emergency Contact: [facility] Address: [address]          [address] [place] [address] [name] of [place] Phone: [phone] Mobile Phone: [phone] Relation: Daughter Secondary Emergency Contact: Lacosta, Ax Mobile Phone: [phone] Relation: Son  Code Status:  DNR Goals of care: Advanced Directive information Advanced Directives 08/19/2020  Does Patient Have a Medical Advance Directive? Yes  Type of Paramedic of [place];Living will;Out of facility DNR (pink MOST or yellow form)  Does patient want to make changes to medical advance directive? No - Patient declined  Copy of [employer] in Chart? Yes - validated most recent copy scanned in chart (See row information)  Would patient like information on creating a medical advance directive? -  Pre-existing out of facility DNR order (yellow form or pink MOST form) Yellow form placed in chart (order not valid for inpatient use)     Chief Complaint  Patient presents with  . Acute Visit    Diarrhea    HPI:  Pt is a 87 y.o. female seen today for an acute visit for Continue Diarrhea  Patient has h/o Hypertension, Hyperlipidemia, Osteoarthritis, Unstable Gait Wheelchair Dependent,h/o Vaginal Atropy, Macular DegenerationAnd  h/o A Fib   Chronic Sacral Wound and LE wounds Was admitted in the Hospital from 10/04-10/06 for Acute Left MCA  strokeDue to Emboli   Has been on PO vancomycin for 3 weeks for  C Diff colitis. Family did not want dificid due to cost Clinically she is better Eating well. No Fever Abdominal Pain is resolved No Nausea But continues to have loose stools 2-3 times a day Has to stay in Contact Isolation due to this Labs done today showed WBC down from 17k to 8k BUN and Creat in good levels     Past Medical History:  Diagnosis Date  . Abnormal uterine bleeding   . Actinic keratosis 02/13/2012  . Acute bronchospasm 10/31/2011  . Cancer ([place]) 04/2008   Skin cancer of low back [name], MD  . Cataract   . Disorder of bone and cartilage, unspecified 02/18/2007  . Dizziness and giddiness 08/30/2010  . Dysmenorrhea   . Edema 12/19/2011  . Endometriosis   . 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 Osteoarthrosis, unspecified whether generalized or localized, unspecified site 08/20/2006  . Osteoporosis   . Other abnormal blood chemistry 03/14/2011  . Other and unspecified hyperlipidemia 10/18/2010  . Palpitations 02/13/2012  . Postmenopausal bleeding 08/30/2010  . Shortness of breath 11/28/2011  . Supraventricular premature beats 05/23/2011  . Unspecified essential hypertension 08/20/2006   Past Surgical History:  Procedure Laterality Date  . CARPAL TUNNEL RELEASE  2004   S. Norris MD  . CATARACT EXTRACTION W/ INTRAOCULAR LENS IMPLANT Right 2010  . CATARACT EXTRACTION W/ INTRAOCULAR LENS IMPLANT Left 10/2012  . DILATION AND CURETTAGE OF UTERUS    . EYE SURGERY Right 07/2009   cataract extraction/IOLI [name], MD  . KNEE ARTHROSCOPY Right 2008  Torn meniscus, Dr. Veverly Fells    Allergies  Allergen Reactions  . Lomotil [Diphenoxylate] Nausea And Vomiting  . Sulfa Antibiotics Other (See Comments)    Pt unknown   . Amlodipine Swelling    Allergies as of 08/25/2020      Reactions   Lomotil [diphenoxylate] Nausea And Vomiting   Sulfa Antibiotics Other (See Comments)   Pt unknown     Amlodipine Swelling      Medication List       Accurate as of August 25, 2020 10:45 AM. If you have any questions, ask your nurse or doctor.        acetaminophen 325 MG tablet Commonly known as: TYLENOL Take 2 tablets (650 mg total) by mouth every 4 (four) hours as needed for mild pain (or temp > 37.5 C (99.5 F)).   apixaban 2.5 MG Tabs tablet Commonly known as: ELIQUIS Take 1 tablet (2.5 mg total) by mouth 2 (two) times daily.   Artificial Tears 0.1-0.3 % Soln Generic drug: Dextran 70-Hypromellose Apply 1 drop to eye in the morning, at noon, in the evening, and at bedtime. Both eyes   atorvastatin 20 MG tablet Commonly known as: LIPITOR Take 1 tablet (20 mg total) by mouth daily.   cholestyramine 4 g packet Commonly known as: QUESTRAN Take 4 g by mouth 2 (two) times daily. 1/2 Pack   diltiazem 120 MG 24 hr capsule Commonly known as: CARDIZEM CD Take 1 capsule (120 mg total) by mouth daily.   feeding supplement (PRO-STAT SUGAR FREE 64) Liqd Take 30 mLs by mouth 2 (two) times daily with a meal.   ferrous sulfate 325 (65 FE) MG tablet Take 325 mg by mouth. Once a day on Sunday, Tuesday, Thursday and Saturday   Firvanq 25 MG/ML Solr Generic drug: Vancomycin HCl Take 5 mLs by mouth. Every 6 Hours   loperamide 2 MG capsule Commonly known as: IMODIUM Take 1 capsule (2 mg total) by mouth every 6 (six) hours as needed for diarrhea or loose stools.   magnesium oxide 400 MG tablet Commonly known as: MAG-OX Take 400 mg by mouth daily.   metoprolol tartrate 50 MG tablet Commonly known as: LOPRESSOR Take 75 mg by mouth 2 (two) times daily.   mirtazapine 7.5 MG tablet Commonly known as: REMERON Take 7.5 mg by mouth every other day.   multivitamin-lutein Caps capsule Take 1 capsule by mouth in the morning and at bedtime.   OXYGEN Inhale 2 L into the lungs as needed (Maintain O2 sat greater than or equal to 90%).   saccharomyces boulardii 250 MG capsule Commonly  known as: FLORASTOR Take 250 mg by mouth 2 (two) times daily.   triamcinolone 0.1 % Commonly known as: KENALOG Apply 1 application topically every other day. Once a day. Apply to legs with esch dressing change.   vitamin B-12 1000 MCG tablet Commonly known as: CYANOCOBALAMIN Take 1,000 mcg by mouth daily.   zinc oxide 20 % ointment Apply 1 application topically as needed for irritation.       Review of Systems  Constitutional: Negative.   HENT: Negative.   Respiratory: Negative.   Cardiovascular: Negative.   Gastrointestinal: Positive for diarrhea. Negative for abdominal pain.  Genitourinary: Negative.   Musculoskeletal: Positive for gait problem.  Skin: Positive for wound.  Neurological: Positive for weakness.  Psychiatric/Behavioral: Negative.     Immunization History  Administered Date(s) Administered  . Fluad Quad(high Dose 65+) 05/26/2020  . Influenza Whole 08/21/2010, 05/21/2012  . Influenza,  High Dose Seasonal PF 05/30/2017, 06/04/2019  . Influenza,inj,Quad PF,6+ Mos 05/23/2018  . Influenza-Unspecified 05/20/2015, 06/08/2016, 05/26/2020  . Moderna Sars-Covid-2 Vaccination 08/25/2019, 09/22/2019, 07/05/2020  . Pneumococcal Conjugate-13 08/22/1999  . Td 08/22/1999  . Zoster 08/21/2005   Pertinent  Health Maintenance Due  Topic Date Due  . DEXA SCAN  Never done  . PNA vac Low Risk Adult (2 of 2 - PPSV23) 08/21/2000  . INFLUENZA VACCINE  Completed   Fall Risk  07/23/2019 01/15/2019 07/17/2018 06/04/2018 09/12/2017  Falls in the past year? 0 0 0 No No  Number falls in past yr: 0 0 0 - -  Injury with Fall? 0 0 0 - -   Functional Status Survey:    Vitals:   08/25/20 1028  BP: 133/70  Pulse: 72  Resp: 20  Temp: (!) 97.4 F (36.3 C)  SpO2: 94%  Weight: 96 lb (43.5 kg)  Height: 5\' 2"  (1.575 m)   Body mass index is 17.56 kg/m. Physical Exam Vitals reviewed.  Constitutional:      Comments: Very Frail  HENT:     Head: Normocephalic.     Nose: Nose  normal.     Mouth/Throat:     Mouth: Mucous membranes are moist.     Pharynx: Oropharynx is clear.  Eyes:     Pupils: Pupils are equal, round, and reactive to light.  Cardiovascular:     Rate and Rhythm: Normal rate. Rhythm irregular.     Pulses: Normal pulses.  Pulmonary:     Effort: Pulmonary effort is normal. No respiratory distress.     Breath sounds: Normal breath sounds. No wheezing or rales.  Abdominal:     General: Abdomen is flat. Bowel sounds are normal. There is no distension.     Palpations: Abdomen is soft.     Tenderness: There is no abdominal tenderness.  Musculoskeletal:        General: No swelling.     Cervical back: Neck supple.  Skin:    Comments: Stage 3 Pressure wound in her Sacral area   Neurological:     General: No focal deficit present.     Mental Status: She is alert and oriented to person, place, and time.  Psychiatric:        Mood and Affect: Mood normal.        Thought Content: Thought content normal.     Labs reviewed: Recent Labs    01/26/20 0446 01/29/20 0000 03/08/20 0000 04/01/20 0000 05/24/20 1741 05/24/20 1756 05/26/20 0322 05/26/20 0828 06/08/20 0000 06/25/20 0000 08/04/20 0000 08/13/20 0000 08/19/20 0000  NA 130*   < > 137   < > 143 142 142  --    < > 141 135* 136* 137  K 3.6   < > 4.0   < > 3.6 3.5 3.2*  --    < > 3.6 4.0 3.9 3.4  CL 101   < > 97*   < > 102 102 102  --    < > 100 100 99 99  CO2 22   < > 37*   < > 29  --  31  --    < > 31*  --  28* 33*  GLUCOSE 109*  --   --   --  117* 113* 102*  --   --   --   --   --   --   BUN 15   < > 24*   < > 26* 29* 20  --    < >  30* 40* 56* 38*  CREATININE 0.66   < > 0.6   < > 0.68 0.60 0.60  --    < > 0.6 1.3* 1.2* 0.8  CALCIUM 7.7*   < > 8.5*   < > 8.8*  --  8.9  --    < > 8.7 8.1* 7.3* 7.6*  MG 1.8  --  1.6  --   --   --   --  2.0  --   --   --   --   --    < > = values in this interval not displayed.   Recent Labs    01/15/20 0840 01/18/20 1302 02/12/20 0000 05/24/20 1741  06/08/20 0000 06/25/20 0000 08/04/20 0000  AST 12 14*   < > 20 13 13  11*  ALT 7 10   < > 17 9 7  6*  ALKPHOS  --  47   < > 63 57 51 54  BILITOT 0.7 1.0  --  0.7  --   --   --   PROT 5.9* 5.8*  --  6.3*  --   --   --   ALBUMIN  --  3.0*   < > 2.9* 3.0* 2.9* 2.5*   < > = values in this interval not displayed.   Recent Labs    01/26/20 0446 01/29/20 0000 05/24/20 1741 05/24/20 1756 05/26/20 0823 06/08/20 0000 06/25/20 0000 08/04/20 0000 08/13/20 0000 08/19/20 0000  WBC 10.5   < > 11.7*  --  10.5   < > 6.4 17.0 15.3 10.9  NEUTROABS 8.6*   < > 9.5*  --  8.6*  --  4,749.00 14,739.00  --   --   HGB 10.0*   < > 10.3*   < > 10.6*   < > 9.8* 11.3* 12.4 11.2*  HCT 31.6*   < > 35.1*   < > 35.1*   < > 32* 35* 39 36  MCV 86.8  --  88.4  --  88.4  --   --   --   --   --   PLT 255   < > 265  --  279   < > 301 382 220 249   < > = values in this interval not displayed.   Lab Results  Component Value Date   TSH 2.095 05/25/2020   Lab Results  Component Value Date   HGBA1C 5.4 05/25/2020   Lab Results  Component Value Date   CHOL 133 05/25/2020   HDL 39 (L) 05/25/2020   LDLCALC 79 05/25/2020   TRIG 73 05/25/2020   CHOLHDL 3.4 05/25/2020    Significant Diagnostic Results in last 30 days:  No results found.  Assessment/Plan C. difficile colitis Will increase Questran to 1 pack BID for loose stool Discontinue Vanco after this week Got full dose 125 mg QID for 3 weeks Clonically doing well  Acute renal injury (HCC) BUN and Creat in good levels now BUN 31 creat 0.73 Off Diuretics right now Anemia, unspecified type Hgb stable on Iron  Atrial fibrillation with rapid ventricular response (HCC) On Eliquis and high doe of Lopressor and Cardizem  Cerebrovascular accident (CVA) due to embolism of left middle cerebral artery (HCC) Now back on eliquis and Lipitor Venous stasis ulcer of calf, unspecified laterality, unspecified ulcer stage, unspecified whether varicose veins present  (HCC) Follows with Wound care Hyperlipidemia, unspecified hyperlipidemia type On Statin  Stage 3 sacral wound Follows with wound care  Family/ staff Communication:   Labs/tests ordered:

## 2020-08-30 ENCOUNTER — Non-Acute Institutional Stay (SKILLED_NURSING_FACILITY): Payer: Medicare Other | Admitting: Nurse Practitioner

## 2020-08-30 ENCOUNTER — Encounter: Payer: Self-pay | Admitting: Nurse Practitioner

## 2020-08-30 DIAGNOSIS — I63412 Cerebral infarction due to embolism of left middle cerebral artery: Secondary | ICD-10-CM

## 2020-08-30 DIAGNOSIS — I4891 Unspecified atrial fibrillation: Secondary | ICD-10-CM

## 2020-08-30 DIAGNOSIS — L89153 Pressure ulcer of sacral region, stage 3: Secondary | ICD-10-CM

## 2020-08-30 DIAGNOSIS — F418 Other specified anxiety disorders: Secondary | ICD-10-CM

## 2020-08-30 DIAGNOSIS — I10 Essential (primary) hypertension: Secondary | ICD-10-CM

## 2020-08-30 DIAGNOSIS — A0472 Enterocolitis due to Clostridium difficile, not specified as recurrent: Secondary | ICD-10-CM | POA: Diagnosis not present

## 2020-08-30 DIAGNOSIS — L89622 Pressure ulcer of left heel, stage 2: Secondary | ICD-10-CM

## 2020-08-30 DIAGNOSIS — D649 Anemia, unspecified: Secondary | ICD-10-CM

## 2020-08-30 DIAGNOSIS — I872 Venous insufficiency (chronic) (peripheral): Secondary | ICD-10-CM

## 2020-08-30 DIAGNOSIS — F5105 Insomnia due to other mental disorder: Secondary | ICD-10-CM

## 2020-08-30 NOTE — Assessment & Plan Note (Signed)
C-diff colitis wbc is down to 10.9 08/19/20, fully treated with  oral Vanco, last dose was 4 days ago, had 3 day course of Questran, no diarrhea in the past 24 hours, denied abd pain, nausea, vomiting, or fever. Update CBC/diff, CMP/eGFR.

## 2020-08-30 NOTE — Assessment & Plan Note (Signed)
Left heel ruptured blister, float heel to reduce pressure, observe.

## 2020-08-30 NOTE — Assessment & Plan Note (Signed)
Sacrococcygeal pressure ulcer, f/u wound care center, covered in dressing during my visit today.  

## 2020-08-30 NOTE — Assessment & Plan Note (Addendum)
BLE edema, none, held Torsemide qod. Bun/creat 31/0.7 08/23/20-will dc Torsemide/Kcl.

## 2020-08-30 NOTE — Assessment & Plan Note (Signed)
Low Bps, will decrease Metoprolol to 50mg  bid, dc Torsemide/Kcl,  continue Diltiazem. Observe.

## 2020-08-30 NOTE — Assessment & Plan Note (Addendum)
Anemia, takes Fe, Vit B12. Vit B12 311, Hgb10.9 08/23/20

## 2020-08-30 NOTE — Assessment & Plan Note (Signed)
Stroke MRI, in hospital 05/24/20-05/26/20, restarted Eliquis. Saw neurology 06/28/20 

## 2020-08-30 NOTE — Assessment & Plan Note (Signed)
Afib, heart rate is in 80s,  takes Metoprolol, Diltiazem,onEliquis since Ischemic left MCA stroke MRI, in hospital 05/24/20-05/26/20

## 2020-08-30 NOTE — Progress Notes (Signed)
Location:    [place] Room Number: 09 Place of Service:  SNF (31) Provider: Marlana Latus NP  [name], [name], NP  Patient Care Team: [name], [name], NP as PCP - General (Internal Medicine) [place] Netta Cedars, MD as Consulting Physician (Orthopedic Surgery) Luberta Mutter, MD as Consulting Physician (Ophthalmology) Sydnee Levans, MD as Consulting Physician (Dermatology) Adrian Prows, MD as Consulting Physician (Cardiology)  Extended Emergency Contact Information Primary Emergency Contact: [facility] Address: [address]          [address] [place] [address] [name] of [place] Phone: [phone] Mobile Phone: [phone] Relation: Daughter Secondary Emergency Contact: Ronnita, Paz Mobile Phone: [phone] Relation: Son  Code Status:  DNR Goals of care: Advanced Directive information Advanced Directives 08/19/2020  Does Patient Have a Medical Advance Directive? Yes  Type of Paramedic of [place];Living will;Out of facility DNR (pink MOST or yellow form)  Does patient want to make changes to medical advance directive? No - Patient declined  Copy of Adrian in Chart? Yes - validated most recent copy scanned in chart (See row information)  Would patient like information on creating a medical advance directive? -  Pre-existing out of facility DNR order (yellow form or pink MOST form) Yellow form placed in chart (order not valid for inpatient use)     Chief Complaint  Patient presents with  . Acute Visit    Low blood pressure    HPI:  Pt is a 87 y.o. female seen today for an acute visit for low blood pressure measurements, in average Bp in 90s/60s, HR in 80s irregular. The patient denied dizziness, headache, change of vision, chest pain/pressure, palpitation, SOB. Torsemide/Kcl has been held since 08/13/20 due to limited oral intake, increased Bun/creat, C-diff  diarrhea, and no swelling.   C-diff colitis wbc is down to 10.9 08/19/20, fully treated with  oral Vanco, last dose was 4 days ago, had 3 day course of Questran, no diarrhea in the past 24 hours, denied abd pain, nausea, vomiting, or fever.              Anemia, takes Fe, Vit B12. Vit B12 311, Hgb10.9 08/23/20 Afib, takes Metoprolol, Diltiazem,onEliquis since Ischemic left MCA stroke MRI, in hospital 05/24/20-05/26/20 BLE edema, none, held Torsemide qod. Bun/creat 31/0.7 08/23/20 Mood/weight, takes Mirtazapine, TSH 2.095 05/25/20  Sacrococcygeal pressure ulcer, f/u wound care center, covered in dressing during my visit today.              Stroke MRI, in hospital 05/24/20-05/26/20, restarted Eliquis. Saw neurology 06/28/20     Past Medical History:  Diagnosis Date  . Abnormal uterine bleeding   . Actinic keratosis 02/13/2012  . Acute bronchospasm 10/31/2011  . Cancer ([place]) 04/2008   Skin cancer of low back [name], MD  . Cataract   . Disorder of bone and cartilage, unspecified 02/18/2007  . Dizziness and giddiness 08/30/2010  . Dysmenorrhea   . Edema 12/19/2011  . Endometriosis   . 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 Osteoarthrosis, unspecified whether generalized or localized, unspecified site 08/20/2006  . Osteoporosis   . Other abnormal blood chemistry 03/14/2011  . Other and unspecified hyperlipidemia 10/18/2010  . Palpitations 02/13/2012  . Postmenopausal bleeding 08/30/2010  . Shortness of breath 11/28/2011  . Supraventricular premature beats 05/23/2011  . Unspecified essential hypertension 08/20/2006   Past Surgical History:  Procedure Laterality Date  .  CARPAL TUNNEL RELEASE  2004   S. Norris MD  . CATARACT EXTRACTION W/ INTRAOCULAR LENS IMPLANT Right 2010  . CATARACT EXTRACTION W/ INTRAOCULAR LENS IMPLANT Left 10/2012  . DILATION AND CURETTAGE OF UTERUS    . EYE SURGERY Right 07/2009   cataract extraction/IOLI [name],  MD  . KNEE ARTHROSCOPY Right 2008   Torn meniscus, Dr. Veverly Fells    Allergies  Allergen Reactions  . Lomotil [Diphenoxylate] Nausea And Vomiting  . Sulfa Antibiotics Other (See Comments)    Pt unknown   . Amlodipine Swelling    Allergies as of 08/30/2020      Reactions   Lomotil [diphenoxylate] Nausea And Vomiting   Sulfa Antibiotics Other (See Comments)   Pt unknown    Amlodipine Swelling      Medication List       Accurate as of August 30, 2020 11:59 PM. If you have any questions, ask your nurse or doctor.        acetaminophen 325 MG tablet Commonly known as: TYLENOL Take 2 tablets (650 mg total) by mouth every 4 (four) hours as needed for mild pain (or temp > 37.5 C (99.5 F)).   apixaban 2.5 MG Tabs tablet Commonly known as: ELIQUIS Take 1 tablet (2.5 mg total) by mouth 2 (two) times daily.   Artificial Tears 0.1-0.3 % Soln Generic drug: Dextran 70-Hypromellose Apply 1 drop to eye in the morning, at noon, in the evening, and at bedtime. Both eyes   atorvastatin 20 MG tablet Commonly known as: LIPITOR Take 1 tablet (20 mg total) by mouth daily.   cholestyramine 4 g packet Commonly known as: QUESTRAN Take 4 g by mouth 2 (two) times daily. 1/2 Pack   diltiazem 120 MG 24 hr capsule Commonly known as: CARDIZEM CD Take 1 capsule (120 mg total) by mouth daily.   feeding supplement (PRO-STAT SUGAR FREE 64) Liqd Take 30 mLs by mouth 2 (two) times daily with a meal.   ferrous sulfate 325 (65 FE) MG tablet Take 325 mg by mouth. Once a day on Sunday, Tuesday, Thursday and Saturday   loperamide 2 MG capsule Commonly known as: IMODIUM Take 1 capsule (2 mg total) by mouth every 6 (six) hours as needed for diarrhea or loose stools.   magnesium oxide 400 MG tablet Commonly known as: MAG-OX Take 400 mg by mouth daily.   metoprolol tartrate 50 MG tablet Commonly known as: LOPRESSOR Take 75 mg by mouth 2 (two) times daily.   mirtazapine 7.5 MG tablet Commonly known  as: REMERON Take 7.5 mg by mouth every other day.   multivitamin-lutein Caps capsule Take 1 capsule by mouth in the morning and at bedtime.   OXYGEN Inhale 2 L into the lungs as needed (Maintain O2 sat greater than or equal to 90%).   saccharomyces boulardii 250 MG capsule Commonly known as: FLORASTOR Take 250 mg by mouth 2 (two) times daily.   triamcinolone 0.1 % Commonly known as: KENALOG Apply 1 application topically every other day. Once a day. Apply to legs with esch dressing change.   vitamin B-12 1000 MCG tablet Commonly known as: CYANOCOBALAMIN Take 1,000 mcg by mouth daily.   zinc oxide 20 % ointment Apply 1 application topically as needed for irritation.       Review of Systems  Constitutional: Negative for appetite change, fatigue and fever.       #3-4Ibs weight loss in the past month.   HENT: Positive for hearing loss. Negative for congestion and  trouble swallowing.   Eyes: Negative for visual disturbance.  Respiratory: Negative for cough, shortness of breath and wheezing.        Chronic DOE  Cardiovascular: Negative for chest pain, palpitations and leg swelling.  Gastrointestinal: Negative for abdominal pain, diarrhea, nausea and vomiting.  Genitourinary: Negative for dysuria and urgency.  Musculoskeletal: Positive for gait problem.  Skin: Positive for wound. Negative for color change.  Neurological: Negative for dizziness, speech difficulty, weakness and headaches.  Psychiatric/Behavioral: Negative for confusion and sleep disturbance. The patient is not nervous/anxious.     Immunization History  Administered Date(s) Administered  . Fluad Quad(high Dose 65+) 05/26/2020  . Influenza Whole 08/21/2010, 05/21/2012  . Influenza, High Dose Seasonal PF 05/30/2017, 06/04/2019  . Influenza,inj,Quad PF,6+ Mos 05/23/2018  . Influenza-Unspecified 05/20/2015, 06/08/2016, 05/26/2020  . Moderna Sars-Covid-2 Vaccination 08/25/2019, 09/22/2019, 07/05/2020  .  Pneumococcal Conjugate-13 08/22/1999  . Td 08/22/1999  . Zoster 08/21/2005   Pertinent  Health Maintenance Due  Topic Date Due  . DEXA SCAN  Never done  . PNA vac Low Risk Adult (2 of 2 - PPSV23) 08/21/2000  . INFLUENZA VACCINE  Completed   Fall Risk  07/23/2019 01/15/2019 07/17/2018 06/04/2018 09/12/2017  Falls in the past year? 0 0 0 No No  Number falls in past yr: 0 0 0 - -  Injury with Fall? 0 0 0 - -   Functional Status Survey:    Vitals:   08/30/20 1205  BP: (!) 84/56  Pulse: 88  Resp: 18  Temp: 97.6 F (36.4 C)  SpO2: 92%  Weight: 96 lb (43.5 kg)  Height: 5' 2" (1.575 m)   Body mass index is 17.56 kg/m. Physical Exam Vitals and nursing note reviewed.  Constitutional:      Appearance: Normal appearance.  HENT:     Head: Normocephalic and atraumatic.     Nose: Nose normal.     Mouth/Throat:     Mouth: Mucous membranes are moist.  Eyes:     Extraocular Movements: Extraocular movements intact.     Conjunctiva/sclera: Conjunctivae normal.     Pupils: Pupils are equal, round, and reactive to light.     Comments: Crusted eyelashes, left eye low vision  Cardiovascular:     Rate and Rhythm: Tachycardia present. Rhythm irregular.     Heart sounds: No murmur heard.     Comments: Weak DP pulses.  Pulmonary:     Effort: Pulmonary effort is normal.     Breath sounds: No rales.  Abdominal:     General: Bowel sounds are normal. There is no distension.     Palpations: Abdomen is soft.     Tenderness: There is no abdominal tenderness. There is no right CVA tenderness, left CVA tenderness, guarding or rebound.  Musculoskeletal:     Cervical back: Normal range of motion and neck supple.     Right lower leg: No edema.     Left lower leg: No edema.  Skin:    General: Skin is warm and dry.     Comments: sacral pressure wound(stage III) per reported, the area is covered in dressing during my examination today. Left heel ruptured blister, no s/s of infection. BLE chronic  venous insufficiency skin change, scabbed over the right lower leg MOHs  Neurological:     General: No focal deficit present.     Mental Status: She is alert and oriented to person, place, and time. Mental status is at baseline.     Gait: Gait abnormal.  Psychiatric:        Mood and Affect: Mood normal.        Behavior: Behavior normal.        Thought Content: Thought content normal.        Judgment: Judgment normal.     Comments: Appears tired.      Labs reviewed: Recent Labs    01/26/20 0446 01/29/20 0000 03/08/20 0000 04/01/20 0000 05/24/20 1741 05/24/20 1756 05/26/20 0322 05/26/20 0828 06/08/20 0000 08/13/20 0000 08/19/20 0000 08/23/20 0000  NA 130*   < > 137   < > 143 142 142  --    < > 136* 137 139  K 3.6   < > 4.0   < > 3.6 3.5 3.2*  --    < > 3.9 3.4 3.7  CL 101   < > 97*   < > 102 102 102  --    < > 99 99 100  CO2 22   < > 37*   < > 29  --  31  --    < > 28* 33* 32*  GLUCOSE 109*  --   --   --  117* 113* 102*  --   --   --   --   --   BUN 15   < > 24*   < > 26* 29* 20  --    < > 56* 38* 31*  CREATININE 0.66   < > 0.6   < > 0.68 0.60 0.60  --    < > 1.2* 0.8 0.7  CALCIUM 7.7*   < > 8.5*   < > 8.8*  --  8.9  --    < > 7.3* 7.6* 7.6*  MG 1.8  --  1.6  --   --   --   --  2.0  --   --   --   --    < > = values in this interval not displayed.   Recent Labs    01/15/20 0840 01/18/20 1302 02/12/20 0000 05/24/20 1741 06/08/20 0000 06/25/20 0000 08/04/20 0000  AST 12 14*   < > _0 11*  ALT 7 10   < > _1 6*  ALKPHOS  --  47   < > 63 57 51 54  BILITOT 0.7 1.0  --  0.7  --   --   --   PROT 5.9* 5.8*  --  6.3*  --   --   --   ALBUMIN  --  3.0*   < > 2.9* 3.0* 2.9* 2.5*   < > = values in this interval not displayed.   Recent Labs    01/26/20 0446 01/29/20 0000 05/24/20 1741 05/24/20 1756 05/26/20 0823 06/08/20 0000 06/25/20 0000 08/04/20 0000 08/13/20 0000 08/19/20 0000 08/23/20 0000  WBC 10.5   < > 11.7*  --  10.5   < > 6.4 17.0 15.3 10.9 8.1   NEUTROABS 8.6*   < > 9.5*  --  8.6*  --  4,749.00 14,739.00  --   --  6,091.00  HGB 10.0*   < > 10.3*   < > 10.6*   < > 9.8* 11.3* 12.4 11.2* 10.9*  HCT 31.6*   < > 35.1*   < > 35.1*   < > 32* 35* 39 36 35*  MCV 86.8  --  88.4  --  88.4  --   --   --   --   --   --  PLT 255   < > 265  --  279   < > 301 382 220 249 254   < > = values in this interval not displayed.   Lab Results  Component Value Date   TSH 2.095 05/25/2020   Lab Results  Component Value Date   HGBA1C 5.4 05/25/2020   Lab Results  Component Value Date   CHOL 133 05/25/2020   HDL 39 (L) 05/25/2020   LDLCALC 79 05/25/2020   TRIG 73 05/25/2020   CHOLHDL 3.4 05/25/2020    Significant Diagnostic Results in last 30 days:  No results found.  Assessment/Plan Essential hypertension Low Bps, will decrease Metoprolol to 12m bid, dc Torsemide/Kcl,  continue Diltiazem. Observe.   Pressure ulcer of left heel, stage 2 (HCC) Left heel ruptured blister, float heel to reduce pressure, observe.   C. difficile colitis C-diff colitis wbc is down to 10.9 08/19/20, fully treated with  oral Vanco, last dose was 4 days ago, had 3 day course of Questran, no diarrhea in the past 24 hours, denied abd pain, nausea, vomiting, or fever. Update CBC/diff, CMP/eGFR.   Anemia Anemia, takes Fe, Vit B12. Vit B12 311, Hgb10.9 08/23/20  Atrial fibrillation with rapid ventricular response (HCC) Afib, heart rate is in 80s,  takes Metoprolol, Diltiazem,onEliquis since Ischemic left MCA stroke MRI, in hospital 05/24/20-05/26/20   Edema of both lower extremities due to peripheral venous insufficiency BLE edema, none, held Torsemide qod. Bun/creat 31/0.7 08/23/20-will dc Torsemide/Kcl.    Insomnia secondary to depression with anxiety Mood/weight, takes Mirtazapine, TSH 2.095 05/25/20  Pressure ulcer Sacrococcygeal pressure ulcer, f/u wound care center, covered in dressing during my visit today.  Stroke ([facility] Stroke MRI, in hospital  05/24/20-05/26/20, restarted Eliquis. Saw neurology 06/28/20     Family/ staff Communication: plan of care reviewed with the patient and charge nurse.   Labs/tests ordered:  CBC/diff, CMP/eGFR  Time spend 35 minutes.

## 2020-08-30 NOTE — Assessment & Plan Note (Signed)
Mood/weight, takes Mirtazapine, TSH 2.095 05/25/20

## 2020-08-31 ENCOUNTER — Encounter: Payer: Self-pay | Admitting: Nurse Practitioner

## 2020-09-02 ENCOUNTER — Encounter: Payer: Self-pay | Admitting: Internal Medicine

## 2020-09-02 ENCOUNTER — Non-Acute Institutional Stay (SKILLED_NURSING_FACILITY): Payer: Medicare Other | Admitting: Internal Medicine

## 2020-09-02 DIAGNOSIS — E785 Hyperlipidemia, unspecified: Secondary | ICD-10-CM

## 2020-09-02 DIAGNOSIS — I63412 Cerebral infarction due to embolism of left middle cerebral artery: Secondary | ICD-10-CM | POA: Diagnosis not present

## 2020-09-02 DIAGNOSIS — A0472 Enterocolitis due to Clostridium difficile, not specified as recurrent: Secondary | ICD-10-CM

## 2020-09-02 DIAGNOSIS — L97209 Non-pressure chronic ulcer of unspecified calf with unspecified severity: Secondary | ICD-10-CM

## 2020-09-02 DIAGNOSIS — L89153 Pressure ulcer of sacral region, stage 3: Secondary | ICD-10-CM | POA: Diagnosis not present

## 2020-09-02 DIAGNOSIS — I4891 Unspecified atrial fibrillation: Secondary | ICD-10-CM

## 2020-09-02 DIAGNOSIS — I83002 Varicose veins of unspecified lower extremity with ulcer of calf: Secondary | ICD-10-CM

## 2020-09-02 LAB — HEPATIC FUNCTION PANEL
ALT: 17 (ref 7–35)
AST: 16 (ref 13–35)
Alkaline Phosphatase: 78 (ref 25–125)
Bilirubin, Total: 0.3

## 2020-09-02 LAB — CBC: RBC: 4.57 (ref 3.87–5.11)

## 2020-09-02 LAB — CBC AND DIFFERENTIAL
HCT: 39 (ref 36–46)
Hemoglobin: 12.1 (ref 12.0–16.0)
Neutrophils Absolute: 6744
Platelets: 325 (ref 150–399)
WBC: 9.2

## 2020-09-02 LAB — BASIC METABOLIC PANEL
BUN: 27 — AB (ref 4–21)
CO2: 28 — AB (ref 13–22)
Chloride: 100 (ref 99–108)
Creatinine: 0.8 (ref 0.5–1.1)
Glucose: 90
Potassium: 3.8 (ref 3.4–5.3)
Sodium: 137 (ref 137–147)

## 2020-09-02 LAB — COMPREHENSIVE METABOLIC PANEL
Albumin: 2.2 — AB (ref 3.5–5.0)
Calcium: 7.8 — AB (ref 8.7–10.7)
Globulin: 2.4

## 2020-09-02 NOTE — Progress Notes (Addendum)
Location:    [facility] Room Number: 9 Place of Service:  SNF [phone]) Provider:  Veleta Miners MD  [name], [name], NP  Patient Care Team: [name], [name], NP as PCP - General (Internal Medicine) Melina Modena, [facility] Netta Cedars, MD as Consulting Physician (Orthopedic Surgery) Luberta Mutter, MD as Consulting Physician (Ophthalmology) Sydnee Levans, MD as Consulting Physician (Dermatology) Adrian Prows, MD as Consulting Physician (Cardiology)  Extended Emergency Contact Information Primary Emergency Contact: [facility] Address: [address]          [address] [place] [address] [name] of [place] Phone: [phone] Mobile Phone: [phone] Relation: Daughter Secondary Emergency Contact: Leandria, Thier Mobile Phone: [phone] Relation: Son  Code Status:  DNR Goals of care: Advanced Directive information Advanced Directives 08/19/2020  Does Patient Have a Medical Advance Directive? Yes  Type of Paramedic of [place];Living will;Out of facility DNR (pink MOST or yellow form)  Does patient want to make changes to medical advance directive? No - Patient declined  Copy of [place] in Chart? Yes - validated most recent copy scanned in chart (See row information)  Would patient like information on creating a medical advance directive? -  Pre-existing out of facility DNR order (yellow form or pink MOST form) Yellow form placed in chart (order not valid for inpatient use)     Chief Complaint  Patient presents with  . Acute Visit    Diarrhea    HPI:  Pt is a 87 y.o. female seen today for an acute visit for Worsening of her diarrhea  Patient has h/o Hypertension, Hyperlipidemia, Osteoarthritis, Unstable Gait Wheelchair Dependent,h/o Vaginal Atropy, Macular DegenerationAnd h/o A Fib   Chronic Sacral Wound and LE wounds Was admitted in the Hospital from 10/04-10/06 for Acute Left MCA  strokeDue to Emboli  Diagnosed with C Diff Colitis on 12/16 Treated with Oral Vanco for 3 weeks Clinically got better with resolved Abdominal pain and White count came down But she continued to have diarrhea. Started on Questran which helped but now again has restarted with more loose stools. Still No Abdominal Pain pr nausea or vomiting No Fever  Past Medical History:  Diagnosis Date  . Abnormal uterine bleeding   . Actinic keratosis 02/13/2012  . Acute bronchospasm 10/31/2011  . Cancer ([place]) 04/2008   Skin cancer of low back [name], MD  . Cataract   . Disorder of bone and cartilage, unspecified 02/18/2007  . Dizziness and giddiness 08/30/2010  . Dysmenorrhea   . Edema 12/19/2011  . Endometriosis   . 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 Osteoarthrosis, unspecified whether generalized or localized, unspecified site 08/20/2006  . Osteoporosis   . Other abnormal blood chemistry 03/14/2011  . Other and unspecified hyperlipidemia 10/18/2010  . Palpitations 02/13/2012  . Postmenopausal bleeding 08/30/2010  . Shortness of breath 11/28/2011  . Supraventricular premature beats 05/23/2011  . Unspecified essential hypertension 08/20/2006   Past Surgical History:  Procedure Laterality Date  . CARPAL TUNNEL RELEASE  2004   S. Norris MD  . CATARACT EXTRACTION W/ INTRAOCULAR LENS IMPLANT Right 2010  . CATARACT EXTRACTION W/ INTRAOCULAR LENS IMPLANT Left 10/2012  . DILATION AND CURETTAGE OF UTERUS    . EYE SURGERY Right 07/2009   cataract extraction/IOLI [name], MD  . KNEE ARTHROSCOPY Right 2008   Torn meniscus, Dr. Veverly Fells    Allergies  Allergen Reactions  . Lomotil [Diphenoxylate] Nausea  And Vomiting  . Sulfa Antibiotics Other (See Comments)    Pt unknown   . Amlodipine Swelling    Allergies as of 09/02/2020      Reactions   Lomotil [diphenoxylate] Nausea And Vomiting   Sulfa Antibiotics Other (See Comments)   Pt unknown    Amlodipine Swelling      Medication List        Accurate as of September 02, 2020 10:33 AM. If you have any questions, ask your nurse or doctor.        acetaminophen 325 MG tablet Commonly known as: TYLENOL Take 2 tablets (650 mg total) by mouth every 4 (four) hours as needed for mild pain (or temp > 37.5 C (99.5 F)).   apixaban 2.5 MG Tabs tablet Commonly known as: ELIQUIS Take 1 tablet (2.5 mg total) by mouth 2 (two) times daily.   Artificial Tears 0.1-0.3 % Soln Generic drug: Dextran 70-Hypromellose Apply 1 drop to eye in the morning, at noon, in the evening, and at bedtime. Both eyes   atorvastatin 20 MG tablet Commonly known as: LIPITOR Take 1 tablet (20 mg total) by mouth daily.   cholestyramine 4 g packet Commonly known as: QUESTRAN Take 4 g by mouth 2 (two) times daily. 1/2 Pack   diltiazem 120 MG 24 hr capsule Commonly known as: CARDIZEM CD Take 1 capsule (120 mg total) by mouth daily.   feeding supplement (PRO-STAT SUGAR FREE 64) Liqd Take 30 mLs by mouth 2 (two) times daily with a meal.   ferrous sulfate 325 (65 FE) MG tablet Take 325 mg by mouth. Once a day on Sunday, Tuesday, Thursday and Saturday   loperamide 2 MG capsule Commonly known as: IMODIUM Take 1 capsule (2 mg total) by mouth every 6 (six) hours as needed for diarrhea or loose stools.   magnesium oxide 400 MG tablet Commonly known as: MAG-OX Take 400 mg by mouth daily.   metoprolol tartrate 50 MG tablet Commonly known as: LOPRESSOR Take 75 mg by mouth 2 (two) times daily.   mirtazapine 7.5 MG tablet Commonly known as: REMERON Take 7.5 mg by mouth every other day.   multivitamin-lutein Caps capsule Take 1 capsule by mouth in the morning and at bedtime.   OXYGEN Inhale 2 L into the lungs as needed (Maintain O2 sat greater than or equal to 90%).   saccharomyces boulardii 250 MG capsule Commonly known as: FLORASTOR Take 250 mg by mouth 2 (two) times daily.   triamcinolone 0.1 % Commonly known as: KENALOG Apply 1 application  topically every other day. Once a day. Apply to legs with esch dressing change.   vitamin B-12 1000 MCG tablet Commonly known as: CYANOCOBALAMIN Take 1,000 mcg by mouth daily.   zinc oxide 20 % ointment Apply 1 application topically as needed for irritation.       Review of Systems  Constitutional: Positive for activity change and appetite change.  HENT: Negative.   Respiratory: Negative.   Cardiovascular: Negative.   Gastrointestinal: Positive for diarrhea. Negative for abdominal pain and nausea.  Genitourinary: Negative.   Musculoskeletal: Positive for gait problem.  Skin: Positive for wound.  Neurological: Positive for weakness.  Psychiatric/Behavioral: Negative.     Immunization History  Administered Date(s) Administered  . Fluad Quad(high Dose 65+) 05/26/2020  . Influenza Whole 08/21/2010, 05/21/2012  . Influenza, High Dose Seasonal PF 05/30/2017, 06/04/2019  . Influenza,inj,Quad PF,6+ Mos 05/23/2018  . Influenza-Unspecified 05/20/2015, 06/08/2016, 05/26/2020  . Moderna Sars-Covid-2 Vaccination 08/25/2019, 09/22/2019, 07/05/2020  .  Pneumococcal Conjugate-13 08/22/1999  . Td 08/22/1999  . Zoster 08/21/2005   Pertinent  Health Maintenance Due  Topic Date Due  . DEXA SCAN  Never done  . PNA vac Low Risk Adult (2 of 2 - PPSV23) 08/21/2000  . INFLUENZA VACCINE  Completed   Fall Risk  07/23/2019 01/15/2019 07/17/2018 06/04/2018 09/12/2017  Falls in the past year? 0 0 0 No No  Number falls in past yr: 0 0 0 - -  Injury with Fall? 0 0 0 - -   Functional Status Survey:    Vitals:   09/02/20 1024  BP: 108/61  Pulse: 99  Resp: 18  Temp: (!) 97 F (36.1 C)  SpO2: 92%  Weight: 96 lb (43.5 kg)  Height: 5\' 2"  (1.575 m)   Body mass index is 17.56 kg/m. Physical Exam Vitals reviewed.  Constitutional:      Appearance: Normal appearance.  HENT:     Head: Normocephalic.     Nose: Nose normal.     Mouth/Throat:     Mouth: Mucous membranes are moist.     Pharynx:  Oropharynx is clear.  Eyes:     Pupils: Pupils are equal, round, and reactive to light.  Cardiovascular:     Rate and Rhythm: Normal rate. Rhythm irregular.     Pulses: Normal pulses.     Heart sounds: Murmur heard.    Pulmonary:     Effort: Pulmonary effort is normal.  Abdominal:     General: Abdomen is flat. Bowel sounds are normal.     Palpations: Abdomen is soft.  Musculoskeletal:        General: No swelling.     Cervical back: Neck supple.  Skin:    General: Skin is warm.  Neurological:     General: No focal deficit present.     Mental Status: She is alert.  Psychiatric:        Mood and Affect: Mood normal.        Thought Content: Thought content normal.     Labs reviewed: Recent Labs    01/26/20 0446 01/29/20 0000 03/08/20 0000 04/01/20 0000 05/24/20 1741 05/24/20 1756 05/26/20 0322 05/26/20 0828 06/08/20 0000 08/13/20 0000 08/19/20 0000 08/23/20 0000  NA 130*   < > 137   < > 143 142 142  --    < > 136* 137 139  K 3.6   < > 4.0   < > 3.6 3.5 3.2*  --    < > 3.9 3.4 3.7  CL 101   < > 97*   < > 102 102 102  --    < > 99 99 100  CO2 22   < > 37*   < > 29  --  31  --    < > 28* 33* 32*  GLUCOSE 109*  --   --   --  117* 113* 102*  --   --   --   --   --   BUN 15   < > 24*   < > 26* 29* 20  --    < > 56* 38* 31*  CREATININE 0.66   < > 0.6   < > 0.68 0.60 0.60  --    < > 1.2* 0.8 0.7  CALCIUM 7.7*   < > 8.5*   < > 8.8*  --  8.9  --    < > 7.3* 7.6* 7.6*  MG 1.8  --  1.6  --   --   --   --  2.0  --   --   --   --    < > = values in this interval not displayed.   Recent Labs    01/15/20 0840 01/18/20 1302 02/12/20 0000 05/24/20 1741 06/08/20 0000 06/25/20 0000 08/04/20 0000  AST 12 14*   < > 20 13 13  11*  ALT 7 10   < > 17 9 7  6*  ALKPHOS  --  47   < > 63 57 51 54  BILITOT 0.7 1.0  --  0.7  --   --   --   PROT 5.9* 5.8*  --  6.3*  --   --   --   ALBUMIN  --  3.0*   < > 2.9* 3.0* 2.9* 2.5*   < > = values in this interval not displayed.   Recent Labs     01/26/20 0446 01/29/20 0000 05/24/20 1741 05/24/20 1756 05/26/20 0823 06/08/20 0000 06/25/20 0000 08/04/20 0000 08/13/20 0000 08/19/20 0000 08/23/20 0000  WBC 10.5   < > 11.7*  --  10.5   < > 6.4 17.0 15.3 10.9 8.1  NEUTROABS 8.6*   < > 9.5*  --  8.6*  --  4,749.00 14,739.00  --   --  6,091.00  HGB 10.0*   < > 10.3*   < > 10.6*   < > 9.8* 11.3* 12.4 11.2* 10.9*  HCT 31.6*   < > 35.1*   < > 35.1*   < > 32* 35* 39 36 35*  MCV 86.8  --  88.4  --  88.4  --   --   --   --   --   --   PLT 255   < > 265  --  279   < > 301 382 220 249 254   < > = values in this interval not displayed.   Lab Results  Component Value Date   TSH 2.095 05/25/2020   Lab Results  Component Value Date   HGBA1C 5.4 05/25/2020   Lab Results  Component Value Date   CHOL 133 05/25/2020   HDL 39 (L) 05/25/2020   LDLCALC 79 05/25/2020   TRIG 73 05/25/2020   CHOLHDL 3.4 05/25/2020    Significant Diagnostic Results in last 30 days:  No results found.  Assessment/Plan C. difficile diarrhea Recheck Stools for C Diff Increased questran to 3/day Restart on Vanco will do Pulse dosing Family has refused Dificid due to cost  Started on Vanco QID for 2 weeks and then BID for 7 days and 125 QD for 7 days 125 Q 2 days for 2 weeks   Other issues  Acute renal injury ([place]) BUN and Creat in good levels now BUN 31 creat 0.73 Off Diuretics right now Anemia, unspecified type Hgb stable on Iron  Atrial fibrillation with rapid ventricular response (HCC) On Eliquis and high dose of Lopressor and Cardizem  Cerebrovascular accident (CVA) due to embolism of left middle cerebral artery (HCC) Now back on eliquis and Lipitor Venous stasis ulcer of calf, unspecified laterality, unspecified ulcer stage, unspecified whether varicose veins present ([place]) Follows with Wound care Hyperlipidemia, unspecified hyperlipidemia type On Statin  Stage 3 sacral wound Follows with wound  care                Family/ staff Communication:   Labs/tests ordered:

## 2020-09-09 ENCOUNTER — Encounter: Payer: Self-pay | Admitting: Internal Medicine

## 2020-09-09 ENCOUNTER — Non-Acute Institutional Stay (SKILLED_NURSING_FACILITY): Payer: Medicare Other | Admitting: Internal Medicine

## 2020-09-09 DIAGNOSIS — A0472 Enterocolitis due to Clostridium difficile, not specified as recurrent: Secondary | ICD-10-CM

## 2020-09-09 DIAGNOSIS — E785 Hyperlipidemia, unspecified: Secondary | ICD-10-CM

## 2020-09-09 DIAGNOSIS — L89153 Pressure ulcer of sacral region, stage 3: Secondary | ICD-10-CM | POA: Diagnosis not present

## 2020-09-09 DIAGNOSIS — I83002 Varicose veins of unspecified lower extremity with ulcer of calf: Secondary | ICD-10-CM

## 2020-09-09 DIAGNOSIS — I4891 Unspecified atrial fibrillation: Secondary | ICD-10-CM | POA: Diagnosis not present

## 2020-09-09 DIAGNOSIS — I63412 Cerebral infarction due to embolism of left middle cerebral artery: Secondary | ICD-10-CM | POA: Diagnosis not present

## 2020-09-09 DIAGNOSIS — D649 Anemia, unspecified: Secondary | ICD-10-CM

## 2020-09-09 DIAGNOSIS — L97209 Non-pressure chronic ulcer of unspecified calf with unspecified severity: Secondary | ICD-10-CM

## 2020-09-09 NOTE — Progress Notes (Signed)
Location:    [facility] Room Number: 9 Place of Service:  SNF [id number]) Provider:  Veleta Miners MD  [name], [name], NP  Patient Care Team: [name], [name], NP as PCP - General (Internal Medicine) Melina Modena, [facility] Netta Cedars, MD as Consulting Physician (Orthopedic Surgery) Luberta Mutter, MD as Consulting Physician (Ophthalmology) Sydnee Levans, MD as Consulting Physician (Dermatology) Adrian Prows, MD as Consulting Physician (Cardiology)  Extended Emergency Contact Information Primary Emergency Contact: [facility] Address: [address]          [address] [place] [address] [name] of [place] Phone: [phone] Mobile Phone: [phone] Relation: Daughter Secondary Emergency Contact: Joleth, Julich Mobile Phone: [phone] Relation: Son  Code Status:  DNR Goals of care: Advanced Directive information Advanced Directives 08/19/2020  Does Patient Have a Medical Advance Directive? Yes  Type of Paramedic of [place];Living will;Out of facility DNR (pink MOST or yellow form)  Does patient want to make changes to medical advance directive? No - Patient declined  Copy of [place] in Chart? Yes - validated most recent copy scanned in chart (See row information)  Would patient like information on creating a medical advance directive? -  Pre-existing out of facility DNR order (yellow form or pink MOST form) Yellow form placed in chart (order not valid for inpatient use)     Chief Complaint  Patient presents with  . Acute Visit    Diarrhea     HPI:  Pt is a 87 y.o. female seen today for an acute visit for Diarrhea   Patient has h/o Hypertension, Hyperlipidemia, Osteoarthritis, Unstable Gait Wheelchair Dependent, Macular Degeneration  h/o A Fib  Chronic Sacral Wound and LE wounds Was admitted in the Hospital from 10/04-10/06 for Acute Left MCA strokeDue to Emboli  Diagnosed  with C Diff colitis on 12/16 when she had abdominal pain with diarrhea and Leucocytosis. NO Prior Antibiotics Treated with Oral Vanco for 2 weeks Normalization of White count. Pain got better but patient continues to c/o Diarrhea. Poor Appetite. Restarted on Oral Vanco pulse dosing but no Improvement. Family refused Dificid due to Cost Also started on Questran. Is helped some but stool still Loose. Also Has some abdominal Discomfort Very frail.   Past Medical History:  Diagnosis Date  . Abnormal uterine bleeding   . Actinic keratosis 02/13/2012  . Acute bronchospasm 10/31/2011  . Cancer ([place]) 04/2008   Skin cancer of low back [name], MD  . Cataract   . Disorder of bone and cartilage, unspecified 02/18/2007  . Dizziness and giddiness 08/30/2010  . Dysmenorrhea   . Edema 12/19/2011  . Endometriosis   . 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 Osteoarthrosis, unspecified whether generalized or localized, unspecified site 08/20/2006  . Osteoporosis   . Other abnormal blood chemistry 03/14/2011  . Other and unspecified hyperlipidemia 10/18/2010  . Palpitations 02/13/2012  . Postmenopausal bleeding 08/30/2010  . Shortness of breath 11/28/2011  . Supraventricular premature beats 05/23/2011  . Unspecified essential hypertension 08/20/2006   Past Surgical History:  Procedure Laterality Date  . CARPAL TUNNEL RELEASE  2004   S. Norris MD  . CATARACT EXTRACTION W/ INTRAOCULAR LENS IMPLANT Right 2010  . CATARACT EXTRACTION W/ INTRAOCULAR LENS IMPLANT Left 10/2012  . DILATION AND CURETTAGE OF UTERUS    . EYE SURGERY Right 07/2009   cataract extraction/IOLI [name], MD  . KNEE ARTHROSCOPY Right 2008  Torn meniscus, Dr. Veverly Fells    Allergies  Allergen Reactions  . Lomotil [Diphenoxylate] Nausea And Vomiting  . Sulfa Antibiotics Other (See Comments)    Pt unknown   . Amlodipine Swelling    Allergies as of 09/09/2020      Reactions   Lomotil [diphenoxylate] Nausea And Vomiting    Sulfa Antibiotics Other (See Comments)   Pt unknown    Amlodipine Swelling      Medication List       Accurate as of September 09, 2020 10:38 AM. If you have any questions, ask your nurse or doctor.        acetaminophen 325 MG tablet Commonly known as: TYLENOL Take 2 tablets (650 mg total) by mouth every 4 (four) hours as needed for mild pain (or temp > 37.5 C (99.5 F)).   apixaban 2.5 MG Tabs tablet Commonly known as: ELIQUIS Take 1 tablet (2.5 mg total) by mouth 2 (two) times daily.   Artificial Tears 0.1-0.3 % Soln Generic drug: Dextran 70-Hypromellose Apply 1 drop to eye in the morning, at noon, in the evening, and at bedtime. Both eyes   atorvastatin 20 MG tablet Commonly known as: LIPITOR Take 1 tablet (20 mg total) by mouth daily.   cholestyramine 4 g packet Commonly known as: QUESTRAN Take 4 g by mouth 3 (three) times daily. 1 Pack   diltiazem 120 MG 24 hr capsule Commonly known as: CARDIZEM CD Take 1 capsule (120 mg total) by mouth daily.   feeding supplement (PRO-STAT SUGAR FREE 64) Liqd Take 30 mLs by mouth 2 (two) times daily with a meal.   ferrous sulfate 325 (65 FE) MG tablet Take 325 mg by mouth. Once a day on Sunday, Tuesday, Thursday and Saturday   Firvanq 25 MG/ML Solr Generic drug: Vancomycin HCl Take by mouth. Vancomycin 125mg  po QID x 14 days Every 6 Hours   Firvanq 25 MG/ML Solr Generic drug: Vancomycin HCl Take by mouth 2 (two) times daily. 125mg /45ml; oral Start taking on: September 17, 2020   Firvanq 25 MG/ML Solr Generic drug: Vancomycin HCl Take by mouth every morning. 125mg /58ml Start taking on: September 24, 2020   Firvanq 25 MG/ML Solr Generic drug: Vancomycin HCl Take by mouth. 125mg /34ml Vancomycin 125mg  po every 2 Days x 14 days Once A Day Every 3 Days Start taking on: October 01, 2020   loperamide 2 MG capsule Commonly known as: IMODIUM Take 1 capsule (2 mg total) by mouth every 6 (six) hours as needed for diarrhea or loose  stools.   magnesium oxide 400 MG tablet Commonly known as: MAG-OX Take 400 mg by mouth daily.   metoprolol tartrate 50 MG tablet Commonly known as: LOPRESSOR Take 75 mg by mouth 2 (two) times daily.   mirtazapine 7.5 MG tablet Commonly known as: REMERON Take 7.5 mg by mouth every other day.   multivitamin-lutein Caps capsule Take 1 capsule by mouth in the morning and at bedtime.   OXYGEN Inhale 2 L into the lungs as needed (Maintain O2 sat greater than or equal to 90%).   saccharomyces boulardii 250 MG capsule Commonly known as: FLORASTOR Take 250 mg by mouth 2 (two) times daily.   triamcinolone 0.1 % Commonly known as: KENALOG Apply 1 application topically every other day. Once a day. Apply to legs with esch dressing change.   vitamin B-12 1000 MCG tablet Commonly known as: CYANOCOBALAMIN Take 1,000 mcg by mouth daily.   zinc oxide 20 % ointment Apply 1 application topically  as needed for irritation.       Review of Systems  Constitutional: Positive for activity change and appetite change.  HENT: Negative.   Respiratory: Negative.   Cardiovascular: Negative.   Gastrointestinal: Positive for diarrhea. Negative for nausea and vomiting.  Genitourinary: Negative.   Musculoskeletal: Positive for gait problem.  Skin: Positive for wound.  Neurological: Positive for weakness.  Psychiatric/Behavioral: Negative.     Immunization History  Administered Date(s) Administered  . Fluad Quad(high Dose 65+) 05/26/2020  . Influenza Whole 08/21/2010, 05/21/2012  . Influenza, High Dose Seasonal PF 05/30/2017, 06/04/2019  . Influenza,inj,Quad PF,6+ Mos 05/23/2018  . Influenza-Unspecified 05/20/2015, 06/08/2016, 05/26/2020  . Moderna Sars-Covid-2 Vaccination 08/25/2019, 09/22/2019, 07/05/2020  . Pneumococcal Conjugate-13 08/22/1999  . Td 08/22/1999  . Zoster 08/21/2005   Pertinent  Health Maintenance Due  Topic Date Due  . DEXA SCAN  Never done  . PNA vac Low Risk Adult (2  of 2 - PPSV23) 08/21/2000  . INFLUENZA VACCINE  Completed   Fall Risk  07/23/2019 01/15/2019 07/17/2018 06/04/2018 09/12/2017  Falls in the past year? 0 0 0 No No  Number falls in past yr: 0 0 0 - -  Injury with Fall? 0 0 0 - -   Functional Status Survey:    Vitals:   09/09/20 1018  BP: (!) 88/58  Pulse: 73  Resp: 16  Temp: (!) 97.1 F (36.2 C)  SpO2: 90%  Weight: 96 lb (43.5 kg)  Height: 5\' 2"  (1.575 m)   Body mass index is 17.56 kg/m. Physical Exam Vitals reviewed.  Constitutional:      Comments: Very Frail  HENT:     Head: Normocephalic.     Nose: Nose normal.     Mouth/Throat:     Mouth: Mucous membranes are moist.     Pharynx: Oropharynx is clear.  Eyes:     Pupils: Pupils are equal, round, and reactive to light.  Abdominal:     General: Abdomen is flat.     Palpations: Abdomen is soft.     Comments: C/o Mild Abdominal Discomfort  Musculoskeletal:        General: No swelling.  Skin:    General: Skin is warm.     Comments: Has stage 3 Pressure sacral wound  Neurological:     General: No focal deficit present.     Mental Status: She is alert and oriented to person, place, and time.  Psychiatric:        Mood and Affect: Mood normal.     Labs reviewed: Recent Labs    01/26/20 0446 01/29/20 0000 03/08/20 0000 04/01/20 0000 05/24/20 1741 05/24/20 1756 05/26/20 0322 05/26/20 0828 06/08/20 0000 08/19/20 0000 08/23/20 0000 09/02/20 0000  NA 130*   < > 137   < > 143 142 142  --    < > 137 139 137  K 3.6   < > 4.0   < > 3.6 3.5 3.2*  --    < > 3.4 3.7 3.8  CL 101   < > 97*   < > 102 102 102  --    < > 99 100 100  CO2 22   < > 37*   < > 29  --  31  --    < > 33* 32* 28*  GLUCOSE 109*  --   --   --  117* 113* 102*  --   --   --   --   --   BUN 15   < >  24*   < > 26* 29* 20  --    < > 38* 31* 27*  CREATININE 0.66   < > 0.6   < > 0.68 0.60 0.60  --    < > 0.8 0.7 0.8  CALCIUM 7.7*   < > 8.5*   < > 8.8*  --  8.9  --    < > 7.6* 7.6* 7.8*  MG 1.8  --  1.6   --   --   --   --  2.0  --   --   --   --    < > = values in this interval not displayed.   Recent Labs    01/15/20 0840 01/18/20 1302 02/12/20 0000 05/24/20 1741 06/08/20 0000 06/25/20 0000 08/04/20 0000 09/02/20 0000  AST 12 14*   < > 20   < > 13 11* 16  ALT 7 10   < > 17   < > 7 6* 17  ALKPHOS  --  47   < > 63   < > 51 54 78  BILITOT 0.7 1.0  --  0.7  --   --   --   --   PROT 5.9* 5.8*  --  6.3*  --   --   --   --   ALBUMIN  --  3.0*   < > 2.9*   < > 2.9* 2.5* 2.2*   < > = values in this interval not displayed.   Recent Labs    01/26/20 0446 01/29/20 0000 05/24/20 1741 05/24/20 1756 05/26/20 0823 06/08/20 0000 08/04/20 0000 08/13/20 0000 08/19/20 0000 08/23/20 0000 09/02/20 0000  WBC 10.5   < > 11.7*  --  10.5   < > 17.0   < > 10.9 8.1 9.2  NEUTROABS 8.6*   < > 9.5*  --  8.6*   < > 14,739.00  --   --  6,091.00 6,744.00  HGB 10.0*   < > 10.3*   < > 10.6*   < > 11.3*   < > 11.2* 10.9* 12.1  HCT 31.6*   < > 35.1*   < > 35.1*   < > 35*   < > 36 35* 39  MCV 86.8  --  88.4  --  88.4  --   --   --   --   --   --   PLT 255   < > 265  --  279   < > 382   < > 249 254 325   < > = values in this interval not displayed.   Lab Results  Component Value Date   TSH 2.095 05/25/2020   Lab Results  Component Value Date   HGBA1C 5.4 05/25/2020   Lab Results  Component Value Date   CHOL 133 05/25/2020   HDL 39 (L) 05/25/2020   LDLCALC 79 05/25/2020   TRIG 73 05/25/2020   CHOLHDL 3.4 05/25/2020    Significant Diagnostic Results in last 30 days:  No results found.  Assessment/Plan C. difficile diarrhea Continues with Diarrhea inspite of being on Oral Vanco for almost 4 weeks Family refused Dificid due to cost Also on Questran 3/day Repeat Stool tested shows C Diff Toxin positive  Plan  Refer to Infectious disease  Atrial fibrillation with rapid ventricular response (HCC) On Eliquis and Lopressor and Cardizem BP very soft  Pressure injury of sacral region, stage  3 ([place]) Follows with Wound care Cerebrovascular accident (CVA) due  to embolism of left middle cerebral artery (HCC) On Leiquis and Statin Venous stasis ulcer of calf, unspecified laterality, unspecified ulcer stage, unspecified whether varicose veins present (HCC) Healed. Legs look good Hyperlipidemia, unspecified hyperlipidemia type On Statin Anemia, unspecified type Discontinue Iron for now. Repeat CBC Frail  Patient continues to be very frail Family aware   Family/ staff Communication:   Labs/tests ordered:  CBC,CMP and Magnesium level in 1 week

## 2020-09-15 ENCOUNTER — Encounter: Payer: Self-pay | Admitting: Internal Medicine

## 2020-09-15 ENCOUNTER — Non-Acute Institutional Stay (SKILLED_NURSING_FACILITY): Payer: Medicare Other | Admitting: Internal Medicine

## 2020-09-15 DIAGNOSIS — D649 Anemia, unspecified: Secondary | ICD-10-CM

## 2020-09-15 DIAGNOSIS — I83002 Varicose veins of unspecified lower extremity with ulcer of calf: Secondary | ICD-10-CM

## 2020-09-15 DIAGNOSIS — I63412 Cerebral infarction due to embolism of left middle cerebral artery: Secondary | ICD-10-CM | POA: Diagnosis not present

## 2020-09-15 DIAGNOSIS — R634 Abnormal weight loss: Secondary | ICD-10-CM

## 2020-09-15 DIAGNOSIS — I4891 Unspecified atrial fibrillation: Secondary | ICD-10-CM | POA: Diagnosis not present

## 2020-09-15 DIAGNOSIS — A0472 Enterocolitis due to Clostridium difficile, not specified as recurrent: Secondary | ICD-10-CM | POA: Diagnosis not present

## 2020-09-15 DIAGNOSIS — L97209 Non-pressure chronic ulcer of unspecified calf with unspecified severity: Secondary | ICD-10-CM

## 2020-09-15 DIAGNOSIS — L89153 Pressure ulcer of sacral region, stage 3: Secondary | ICD-10-CM | POA: Diagnosis not present

## 2020-09-15 DIAGNOSIS — I872 Venous insufficiency (chronic) (peripheral): Secondary | ICD-10-CM

## 2020-09-15 DIAGNOSIS — E785 Hyperlipidemia, unspecified: Secondary | ICD-10-CM

## 2020-09-15 NOTE — Progress Notes (Signed)
Location:    [place] Room Number: 9 Place of Service:  SNF [phone]) Provider:  Veleta Miners MD  [name], [name], NP  Patient Care Team: [name], [name], NP as PCP - General (Internal Medicine) Melina Modena, [facility] Netta Cedars, MD as Consulting Physician (Orthopedic Surgery) Luberta Mutter, MD as Consulting Physician (Ophthalmology) Sydnee Levans, MD as Consulting Physician (Dermatology) Adrian Prows, MD as Consulting Physician (Cardiology)  Extended Emergency Contact Information Primary Emergency Contact: [facility] Address: [address]          [address] [place] [address] [name] of [place] Phone: [phone] Mobile Phone: [phone] Relation: Daughter Secondary Emergency Contact: Nehal, Witting Mobile Phone: [phone] Relation: Son  Code Status:  DNR Goals of care: Advanced Directive information Advanced Directives 08/19/2020  Does Patient Have a Medical Advance Directive? Yes  Type of Paramedic of [place];Living will;Out of facility DNR (pink MOST or yellow form)  Does patient want to make changes to medical advance directive? No - Patient declined  Copy of [place] in Chart? Yes - validated most recent copy scanned in chart (See row information)  Would patient like information on creating a medical advance directive? -  Pre-existing out of facility DNR order (yellow form or pink MOST form) Yellow form placed in chart (order not valid for inpatient use)     Chief Complaint  Patient presents with  . Medical Management of Chronic Issues  . Health Maintenance    Dexa scan, PPSV23, TDAP    HPI:  Pt is a 87 y.o. female seen today for medical management of chronic diseases.  And discuss Hospice care with the patient  Patient has h/o Hypertension, Hyperlipidemia, Osteoarthritis, Unstable Gait Wheelchair Dependent, Macular Degeneration h/o A Fib  Chronic Sacral Wound  and LE wounds Was admitted in the Hospital from 10/04-10/06 for Acute Left MCA strokeDue to Emboli  Diagnosed with C Diff colitis on 12/16 when she had abdominal pain with diarrhea and Leucocytosis. NO Prior Antibiotics Treated with Oral Vanco for 2 weeks Normalization of White count. Pain got better but patient continues to c/o Diarrhea. Poor Appetite. Restarted on Oral Vanco pulse dosing but no Improvement. Family refused Dificid due to Cost Also started on Questran. Is helped some but stool still Loose Made referal for Infectious disease but son came to visit her this week and they all have decided to consider Hospice. Patient wanted to know what will change if she goes with hospice Her diarrhea has improved some. Appetite is still poor No other discomfort. Feels very weak Unable to get up or sit in recliner   Past Medical History:  Diagnosis Date  . Abnormal uterine bleeding   . Actinic keratosis 02/13/2012  . Acute bronchospasm 10/31/2011  . Cancer ([place]) 04/2008   Skin cancer of low back [name], MD  . Cataract   . Disorder of bone and cartilage, unspecified 02/18/2007  . Dizziness and giddiness 08/30/2010  . Dysmenorrhea   . Edema 12/19/2011  . Endometriosis   . 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 Osteoarthrosis, unspecified whether generalized or localized, unspecified site 08/20/2006  . Osteoporosis   . Other abnormal blood chemistry 03/14/2011  . Other and unspecified hyperlipidemia 10/18/2010  . Palpitations 02/13/2012  . Postmenopausal bleeding 08/30/2010  . Shortness of breath 11/28/2011  . Supraventricular premature beats 05/23/2011  . Unspecified essential hypertension 08/20/2006   Past Surgical History:  Procedure Laterality Date  . CARPAL TUNNEL RELEASE  2004   S. Norris MD  . CATARACT EXTRACTION W/ INTRAOCULAR LENS IMPLANT Right 2010  . CATARACT EXTRACTION W/ INTRAOCULAR LENS IMPLANT Left 10/2012  . DILATION AND CURETTAGE OF UTERUS    . EYE  SURGERY Right 07/2009   cataract extraction/IOLI [name], MD  . KNEE ARTHROSCOPY Right 2008   Torn meniscus, Dr. Veverly Fells    Allergies  Allergen Reactions  . Lomotil [Diphenoxylate] Nausea And Vomiting  . Sulfa Antibiotics Other (See Comments)    Pt unknown   . Amlodipine Swelling    Allergies as of 09/15/2020      Reactions   Lomotil [diphenoxylate] Nausea And Vomiting   Sulfa Antibiotics Other (See Comments)   Pt unknown    Amlodipine Swelling      Medication List       Accurate as of September 15, 2020 11:16 AM. If you have any questions, ask your nurse or doctor.        acetaminophen 325 MG tablet Commonly known as: TYLENOL Take 2 tablets (650 mg total) by mouth every 4 (four) hours as needed for mild pain (or temp > 37.5 C (99.5 F)).   apixaban 2.5 MG Tabs tablet Commonly known as: ELIQUIS Take 1 tablet (2.5 mg total) by mouth 2 (two) times daily.   Artificial Tears 0.1-0.3 % Soln Generic drug: Dextran 70-Hypromellose Apply 1 drop to eye in the morning, at noon, in the evening, and at bedtime. Both eyes   atorvastatin 20 MG tablet Commonly known as: LIPITOR Take 1 tablet (20 mg total) by mouth daily.   cholestyramine 4 g packet Commonly known as: QUESTRAN Take 4 g by mouth 3 (three) times daily. 1 Pack   diltiazem 120 MG 24 hr capsule Commonly known as: CARDIZEM CD Take 1 capsule (120 mg total) by mouth daily.   feeding supplement (PRO-STAT SUGAR FREE 64) Liqd Take 30 mLs by mouth 2 (two) times daily with a meal.   Firvanq 25 MG/ML Solr Generic drug: Vancomycin HCl Take by mouth. Vancomycin 125mg  po QID x 14 days Every 6 Hours   Firvanq 25 MG/ML Solr Generic drug: Vancomycin HCl Take by mouth 2 (two) times daily. 125mg /82ml; oral Start taking on: September 17, 2020   Firvanq 25 MG/ML Solr Generic drug: Vancomycin HCl Take by mouth every morning. 125mg /34ml Start taking on: September 24, 2020   Firvanq 25 MG/ML Solr Generic drug: Vancomycin HCl Take by  mouth. 125mg /63ml Vancomycin 125mg  po every 2 Days x 14 days Once A Day Every 3 Days Start taking on: October 01, 2020   loperamide 2 MG capsule Commonly known as: IMODIUM Take 1 capsule (2 mg total) by mouth every 6 (six) hours as needed for diarrhea or loose stools.   metoprolol tartrate 50 MG tablet Commonly known as: LOPRESSOR Take 50 mg by mouth 2 (two) times daily.   mirtazapine 7.5 MG tablet Commonly known as: REMERON Take 7.5 mg by mouth every other day.   multivitamin-lutein Caps capsule Take 1 capsule by mouth in the morning and at bedtime.   OXYGEN Inhale 2 L into the lungs as needed (Maintain O2 sat greater than or equal to 90%).   saccharomyces boulardii 250 MG capsule Commonly known as: FLORASTOR Take 250 mg by mouth 2 (two) times daily.   triamcinolone 0.1 % Commonly known as: KENALOG Apply 1 application topically every other day. Once a day. Apply to legs with esch dressing change.   vitamin B-12  1000 MCG tablet Commonly known as: CYANOCOBALAMIN Take 1,000 mcg by mouth daily.   zinc oxide 20 % ointment Apply 1 application topically as needed for irritation.       Review of Systems  Constitutional: Positive for activity change and appetite change.  HENT: Negative.   Respiratory: Negative.   Cardiovascular: Negative.   Gastrointestinal: Positive for diarrhea.  Genitourinary: Negative.   Musculoskeletal: Positive for gait problem.  Skin: Positive for rash.  Neurological: Positive for weakness.  Psychiatric/Behavioral: Negative.     Immunization History  Administered Date(s) Administered  . Fluad Quad(high Dose 65+) 05/26/2020  . Influenza Whole 08/21/2010, 05/21/2012  . Influenza, High Dose Seasonal PF 05/30/2017, 06/04/2019  . Influenza,inj,Quad PF,6+ Mos 05/23/2018  . Influenza-Unspecified 05/20/2015, 06/08/2016, 05/26/2020  . Moderna Sars-Covid-2 Vaccination 08/25/2019, 09/22/2019, 07/05/2020  . Pneumococcal Conjugate-13 08/22/1999  . Td  08/22/1999  . Zoster 08/21/2005   Pertinent  Health Maintenance Due  Topic Date Due  . DEXA SCAN  Never done  . PNA vac Low Risk Adult (2 of 2 - PPSV23) 08/21/2000  . INFLUENZA VACCINE  Completed   Fall Risk  07/23/2019 01/15/2019 07/17/2018 06/04/2018 09/12/2017  Falls in the past year? 0 0 0 No No  Number falls in past yr: 0 0 0 - -  Injury with Fall? 0 0 0 - -   Functional Status Survey:    Vitals:   09/15/20 1112  BP: (!) 113/59  Pulse: 98  Resp: 18  Temp: (!) 96.2 F (35.7 C)  SpO2: 93%  Weight: 96 lb (43.5 kg)  Height: 5\' 2"  (1.575 m)   Body mass index is 17.56 kg/m. Physical Exam Vitals reviewed.  Constitutional:      Comments: Very frail  HENT:     Head: Normocephalic.     Nose: Nose normal.     Mouth/Throat:     Mouth: Mucous membranes are moist.     Pharynx: Oropharynx is clear.  Eyes:     Pupils: Pupils are equal, round, and reactive to light.  Cardiovascular:     Rate and Rhythm: Normal rate. Rhythm irregular.     Pulses: Normal pulses.  Pulmonary:     Effort: Pulmonary effort is normal.     Breath sounds: Normal breath sounds.  Abdominal:     General: Abdomen is flat. Bowel sounds are normal.     Palpations: Abdomen is soft.  Musculoskeletal:        General: No swelling.     Cervical back: Neck supple.     Comments: Chronic Venous changes  Skin:    General: Skin is warm.     Comments: Has petechiae in her hands  Neurological:     General: No focal deficit present.     Mental Status: She is alert and oriented to person, place, and time.  Psychiatric:        Mood and Affect: Mood normal.        Thought Content: Thought content normal.     Labs reviewed: Recent Labs    01/26/20 0446 01/29/20 0000 03/08/20 0000 04/01/20 0000 05/24/20 1741 05/24/20 1756 05/26/20 0322 05/26/20 0828 06/08/20 0000 08/19/20 0000 08/23/20 0000 09/02/20 0000  NA 130*   < > 137   < > 143 142 142  --    < > 137 139 137  K 3.6   < > 4.0   < > 3.6 3.5 3.2*   --    < > 3.4 3.7 3.8  CL 101   < >  97*   < > 102 102 102  --    < > 99 100 100  CO2 22   < > 37*   < > 29  --  31  --    < > 33* 32* 28*  GLUCOSE 109*  --   --   --  117* 113* 102*  --   --   --   --   --   BUN 15   < > 24*   < > 26* 29* 20  --    < > 38* 31* 27*  CREATININE 0.66   < > 0.6   < > 0.68 0.60 0.60  --    < > 0.8 0.7 0.8  CALCIUM 7.7*   < > 8.5*   < > 8.8*  --  8.9  --    < > 7.6* 7.6* 7.8*  MG 1.8  --  1.6  --   --   --   --  2.0  --   --   --   --    < > = values in this interval not displayed.   Recent Labs    01/15/20 0840 01/18/20 1302 02/12/20 0000 05/24/20 1741 06/08/20 0000 06/25/20 0000 08/04/20 0000 09/02/20 0000  AST 12 14*   < > 20   < > 13 11* 16  ALT 7 10   < > 17   < > 7 6* 17  ALKPHOS  --  47   < > 63   < > 51 54 78  BILITOT 0.7 1.0  --  0.7  --   --   --   --   PROT 5.9* 5.8*  --  6.3*  --   --   --   --   ALBUMIN  --  3.0*   < > 2.9*   < > 2.9* 2.5* 2.2*   < > = values in this interval not displayed.   Recent Labs    01/26/20 0446 01/29/20 0000 05/24/20 1741 05/24/20 1756 05/26/20 0823 06/08/20 0000 08/04/20 0000 08/13/20 0000 08/19/20 0000 08/23/20 0000 09/02/20 0000  WBC 10.5   < > 11.7*  --  10.5   < > 17.0   < > 10.9 8.1 9.2  NEUTROABS 8.6*   < > 9.5*  --  8.6*   < > 14,739.00  --   --  6,091.00 6,744.00  HGB 10.0*   < > 10.3*   < > 10.6*   < > 11.3*   < > 11.2* 10.9* 12.1  HCT 31.6*   < > 35.1*   < > 35.1*   < > 35*   < > 36 35* 39  MCV 86.8  --  88.4  --  88.4  --   --   --   --   --   --   PLT 255   < > 265  --  279   < > 382   < > 249 254 325   < > = values in this interval not displayed.   Lab Results  Component Value Date   TSH 2.095 05/25/2020   Lab Results  Component Value Date   HGBA1C 5.4 05/25/2020   Lab Results  Component Value Date   CHOL 133 05/25/2020   HDL 39 (L) 05/25/2020   LDLCALC 79 05/25/2020   TRIG 73 05/25/2020   CHOLHDL 3.4 05/25/2020    Significant Diagnostic Results in last 30 days:  No  results found.  Assessment/Plan C. difficile diarrhea Does not want to see Infectious disease Will add Immodium 2 mg QD Continue on Questran Already on Vanco po Pulse dosing Dificid refused due to cost  Atrial fibrillation with rapid ventricular response (HCC) On Cardizem,Toprol and Eliquis Pressure injury of sacral region, stage 3 ([place]) Follows with Wound Care Has not been to see them since C Diff infection  Cerebrovascular accident (CVA) due to embolism of left middle cerebral artery (HCC) On Eliquis and statin  Venous stasis ulcer of calf, unspecified laterality, unspecified ulcer stage, unspecified whether varicose veins present ([place]) Healed now  Hyperlipidemia, unspecified hyperlipidemia type Continue Statin for her CVA history  Anemia, unspecified type Hgb stable on Iron  Edema of both lower extremities due to peripheral venous insufficiency Off Diuretics right now Weight loss On Remeron Hospice consult made. 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

## 2020-09-16 LAB — CBC AND DIFFERENTIAL
HCT: 31 — AB (ref 36–46)
Hemoglobin: 9.6 — AB (ref 12.0–16.0)
Neutrophils Absolute: 6162
Platelets: 294 (ref 150–399)
WBC: 7.8

## 2020-09-16 LAB — COMPREHENSIVE METABOLIC PANEL
Albumin: 2 — AB (ref 3.5–5.0)
Calcium: 7.8 — AB (ref 8.7–10.7)
Globulin: 2.3

## 2020-09-16 LAB — CBC: RBC: 3.59 — AB (ref 3.87–5.11)

## 2020-09-16 LAB — BASIC METABOLIC PANEL
BUN: 27 — AB (ref 4–21)
CO2: 26 — AB (ref 13–22)
Chloride: 105 (ref 99–108)
Creatinine: 0.6 (ref 0.5–1.1)
Glucose: 84
Potassium: 3 — AB (ref 3.4–5.3)
Sodium: 140 (ref 137–147)

## 2020-09-16 LAB — HEPATIC FUNCTION PANEL
ALT: 9 (ref 7–35)
AST: 10 — AB (ref 13–35)
Alkaline Phosphatase: 68 (ref 25–125)
Bilirubin, Total: 0.3

## 2020-09-17 ENCOUNTER — Ambulatory Visit: Payer: Medicare Other | Admitting: Infectious Diseases

## 2020-09-21 ENCOUNTER — Encounter: Payer: Self-pay | Admitting: Nurse Practitioner

## 2020-09-21 ENCOUNTER — Non-Acute Institutional Stay (SKILLED_NURSING_FACILITY): Payer: Medicare Other | Admitting: Nurse Practitioner

## 2020-09-21 DIAGNOSIS — R634 Abnormal weight loss: Secondary | ICD-10-CM

## 2020-09-21 DIAGNOSIS — A0472 Enterocolitis due to Clostridium difficile, not specified as recurrent: Secondary | ICD-10-CM

## 2020-09-21 DIAGNOSIS — L89620 Pressure ulcer of left heel, unstageable: Secondary | ICD-10-CM

## 2020-09-21 DIAGNOSIS — D649 Anemia, unspecified: Secondary | ICD-10-CM

## 2020-09-21 DIAGNOSIS — I872 Venous insufficiency (chronic) (peripheral): Secondary | ICD-10-CM

## 2020-09-21 DIAGNOSIS — F5105 Insomnia due to other mental disorder: Secondary | ICD-10-CM

## 2020-09-21 DIAGNOSIS — I63412 Cerebral infarction due to embolism of left middle cerebral artery: Secondary | ICD-10-CM

## 2020-09-21 DIAGNOSIS — F418 Other specified anxiety disorders: Secondary | ICD-10-CM

## 2020-09-21 DIAGNOSIS — L89154 Pressure ulcer of sacral region, stage 4: Secondary | ICD-10-CM | POA: Insufficient documentation

## 2020-09-21 DIAGNOSIS — I4891 Unspecified atrial fibrillation: Secondary | ICD-10-CM | POA: Diagnosis not present

## 2020-09-21 DIAGNOSIS — L8995 Pressure ulcer of unspecified site, unstageable: Secondary | ICD-10-CM | POA: Insufficient documentation

## 2020-09-21 NOTE — Assessment & Plan Note (Signed)
BLE edema,none, off Torsemide qod. Bun/creat 27/0.8 09/02/20

## 2020-09-21 NOTE — Assessment & Plan Note (Signed)
Developed from previous ruptured blister, center is covered with yellow slough, will cover the wound with Hydrocolloid dressing q3 days and prn. Float heels while in bed.

## 2020-09-21 NOTE — Assessment & Plan Note (Deleted)
C-diff colitis, only desires Imodium for symptomatic management, declined ID consultation, Vanco. Wbc 9.2 09/02/20  

## 2020-09-21 NOTE — Assessment & Plan Note (Addendum)
A golf ball sized pressure ulcer with visible coccyx bone fascia. Continue Silver dressing packing, avoid pressure by repositioning frequently. Sacrococcygeal pressure ulcer, stage 4, declined continuation of  wound care center. Will have prn Morphine for pain control.

## 2020-09-21 NOTE — Assessment & Plan Note (Signed)
   Mood/weight, desires to be off  [place] 2.095 05/25/20

## 2020-09-21 NOTE — Assessment & Plan Note (Signed)
Stroke MRI, in hospital 05/24/20-05/26/20, desires to be  Eliquis. Saw neurology 06/28/20

## 2020-09-21 NOTE — Progress Notes (Signed)
Location:    [facility] Room Number: 09 Place of Service:  SNF (31) Provider: Marlana Latus NP  Terri Acosta X, NP  Patient Care Team: Seleta Hovland X, NP as PCP - General (Internal Medicine) [place] Netta Cedars, MD as Consulting Physician (Orthopedic Surgery) Luberta Mutter, MD as Consulting Physician (Ophthalmology) Sydnee Levans, MD as Consulting Physician (Dermatology) Adrian Prows, MD as Consulting Physician (Cardiology)  Extended Emergency Contact Information Primary Emergency Contact: [facility] Address: [address]          [address] [place] [place] [address] Terri Acosta of [place] Phone: [phone] Mobile Phone: [phone] Relation: Daughter Secondary Emergency Contact: Terri, Acosta Mobile Phone: [phone] Relation: Son  Code Status:  DNR Goals of care: Advanced Directive information Advanced Directives 08/19/2020  Does Patient Have a Medical Advance Directive? Yes  Type of Paramedic of [place];Living will;Out of facility DNR (pink MOST or yellow form)  Does patient want to make changes to medical advance directive? No - Patient declined  Copy of [place] in Chart? Yes - validated most recent copy scanned in chart (See row information)  Would patient like information on creating a medical advance directive? -  Pre-existing out of facility DNR order (yellow form or pink MOST form) Yellow form placed in chart (order not valid for inpatient use)     Chief Complaint  Patient presents with  . Acute Visit    R heel pressure ulcer, review medication.    HPI:  Pt is a 87 y.o. female seen today for an acute visit for review medications, pain management, pressure ulcer L heel   C-diff colitis, only desires Imodium for symptomatic management, declined ID consultation, Vanco. Wbc 9.2 09/02/20 Anemia, desires off  Fe, Vit B12. Vit B12 311, Hgb12.1  09/02/20 Afib, takes Metoprolol, Diltiazem,desires to be off Eliquis since Ischemic left MCA stroke MRI, in hospital 05/24/20-05/26/20 BLE edema,none, off Torsemide qod. Bun/creat 27/0.8 09/02/20 Mood/weight, desires to be off  [place] 2.095 05/25/20  Sacrococcygeal pressure ulcer, stage 4, declined continuation of  wound care center, silver dressing packing presently.  Stroke MRI, in hospital 05/24/20-05/26/20, desires to be  Eliquis. Saw neurology 06/28/20   Past Medical History:  Diagnosis Date  . Abnormal uterine bleeding   . Actinic keratosis 02/13/2012  . Acute bronchospasm 10/31/2011  . Cancer ([place]) 04/2008   Skin cancer of low back Terri Jews, MD  . Cataract   . Disorder of bone and cartilage, unspecified 02/18/2007  . Dizziness and giddiness 08/30/2010  . Dysmenorrhea   . Edema 12/19/2011  . Endometriosis   . 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 Osteoarthrosis, unspecified whether generalized or localized, unspecified site 08/20/2006  . Osteoporosis   . Other abnormal blood chemistry 03/14/2011  . Other and unspecified hyperlipidemia 10/18/2010  . Palpitations 02/13/2012  . Postmenopausal bleeding 08/30/2010  . Shortness of breath 11/28/2011  . Supraventricular premature beats 05/23/2011  . Unspecified essential hypertension 08/20/2006   Past Surgical History:  Procedure Laterality Date  . CARPAL TUNNEL RELEASE  2004   S. Norris MD  . CATARACT EXTRACTION W/ INTRAOCULAR LENS IMPLANT Right 2010  . CATARACT EXTRACTION W/ INTRAOCULAR LENS IMPLANT Left 10/2012  . DILATION AND CURETTAGE OF UTERUS    . EYE SURGERY Right 07/2009   cataract extraction/IOLI [name], MD  . KNEE ARTHROSCOPY Right 2008   Torn meniscus, Dr. Veverly Fells    Allergies  Allergen  Reactions  . Lomotil [Diphenoxylate] Nausea And Vomiting  . Sulfa Antibiotics Other (See Comments)    Pt unknown   . Amlodipine Swelling    Allergies as  of 09/21/2020      Reactions   Lomotil [diphenoxylate] Nausea And Vomiting   Sulfa Antibiotics Other (See Comments)   Pt unknown    Amlodipine Swelling      Medication List       Accurate as of September 21, 2020  4:13 PM. If you have any questions, ask your nurse or doctor.        acetaminophen 325 MG tablet Commonly known as: TYLENOL Take 2 tablets (650 mg total) by mouth every 4 (four) hours as needed for mild pain (or temp > 37.5 C (99.5 F)).   apixaban 2.5 MG Tabs tablet Commonly known as: ELIQUIS Take 1 tablet (2.5 mg total) by mouth 2 (two) times daily.   Artificial Tears 0.1-0.3 % Soln Generic drug: Dextran 70-Hypromellose Apply 1 drop to eye in the morning, at noon, in the evening, and at bedtime. Both eyes   atorvastatin 20 MG tablet Commonly known as: LIPITOR Take 1 tablet (20 mg total) by mouth daily.   cholestyramine 4 g packet Commonly known as: QUESTRAN Take 4 g by mouth 3 (three) times daily. 1 Pack   diltiazem 120 MG 24 hr capsule Commonly known as: CARDIZEM CD Take 1 capsule (120 mg total) by mouth daily.   feeding supplement (PRO-STAT SUGAR FREE 64) Liqd Take 30 mLs by mouth 2 (two) times daily with a meal.   Firvanq 25 MG/ML Solr Generic drug: Vancomycin HCl Take by mouth 2 (two) times daily. 125mg /53ml; oral   Firvanq 25 MG/ML Solr Generic drug: Vancomycin HCl Take by mouth every morning. 125mg /6ml Start taking on: September 24, 2020   Firvanq 25 MG/ML Solr Generic drug: Vancomycin HCl Take by mouth. 125mg /22ml Vancomycin 125mg  po every 2 Days x 14 days Once A Day Every 3 Days Start taking on: October 01, 2020   loperamide 2 MG capsule Commonly known as: IMODIUM Take 1 capsule (2 mg total) by mouth every 6 (six) hours as needed for diarrhea or loose stools.   metoprolol tartrate 50 MG tablet Commonly known as: LOPRESSOR Take 50 mg by mouth 2 (two) times daily.   mirtazapine 7.5 MG tablet Commonly known as: REMERON Take 7.5 mg by mouth  every other day.   multivitamin-lutein Caps capsule Take 1 capsule by mouth in the morning and at bedtime.   OXYGEN Inhale 2 L into the lungs as needed (Maintain O2 sat greater than or equal to 90%).   potassium chloride SA 20 MEQ tablet Commonly known as: KLOR-CON Take 20 mEq by mouth daily.   saccharomyces boulardii 250 MG capsule Commonly known as: FLORASTOR Take 250 mg by mouth 2 (two) times daily.   triamcinolone 0.1 % Commonly known as: KENALOG Apply 1 application topically every other day. Once a day. Apply to legs with esch dressing change.   vitamin B-12 1000 MCG tablet Commonly known as: CYANOCOBALAMIN Take 1,000 mcg by mouth daily.   zinc oxide 20 % ointment Apply 1 application topically as needed for irritation.       Review of Systems  Constitutional: Positive for appetite change and fatigue. Negative for fever.       #3-4Ibs weight loss in the past month.   HENT: Positive for hearing loss. Negative for congestion and trouble swallowing.   Eyes: Negative for visual disturbance.  Respiratory: Negative  for cough and shortness of breath.        Chronic DOE  Cardiovascular: Negative for leg swelling.  Gastrointestinal: Positive for diarrhea. Negative for abdominal pain, nausea and vomiting.  Genitourinary: Negative for dysuria and urgency.  Musculoskeletal: Positive for gait problem.  Skin: Positive for wound. Negative for color change.       Left heel, coccygeal pressure wounds.   Neurological: Negative for speech difficulty, weakness and headaches.  Psychiatric/Behavioral: Negative for confusion and sleep disturbance. The patient is not nervous/anxious.     Immunization History  Administered Date(s) Administered  . Fluad Quad(high Dose 65+) 05/26/2020  . Influenza Whole 08/21/2010, 05/21/2012  . Influenza, High Dose Seasonal PF 05/30/2017, 06/04/2019  . Influenza,inj,Quad PF,6+ Mos 05/23/2018  . Influenza-Unspecified 05/20/2015, 06/08/2016, 05/26/2020   . Moderna Sars-Covid-2 Vaccination 08/25/2019, 09/22/2019, 07/05/2020  . Pneumococcal Conjugate-13 08/22/1999  . Td 08/22/1999  . Zoster 08/21/2005   Pertinent  Health Maintenance Due  Topic Date Due  . DEXA SCAN  Never done  . PNA vac Low Risk Adult (2 of 2 - PPSV23) 08/21/2000  . INFLUENZA VACCINE  Completed   Fall Risk  07/23/2019 01/15/2019 07/17/2018 06/04/2018 09/12/2017  Falls in the past year? 0 0 0 No No  Number falls in past yr: 0 0 0 - -  Injury with Fall? 0 0 0 - -   Functional Status Survey:    Vitals:   09/21/20 1339  BP: 113/67  Pulse: (!) 105  Resp: 12  Temp: 97.8 F (36.6 C)  SpO2: 93%  Weight: 88 lb 6.4 oz (40.1 kg)  Height: 5\' 2"  (1.575 m)   Body mass index is 16.17 kg/m. Physical Exam Vitals and nursing note reviewed.  Constitutional:      Appearance: Normal appearance.  HENT:     Head: Normocephalic and atraumatic.     Nose: Nose normal.     Mouth/Throat:     Mouth: Mucous membranes are moist.  Eyes:     Extraocular Movements: Extraocular movements intact.     Conjunctiva/sclera: Conjunctivae normal.     Pupils: Pupils are equal, round, and reactive to light.     Comments: Crusted eyelashes, left eye low vision  Cardiovascular:     Rate and Rhythm: Tachycardia present. Rhythm irregular.     Heart sounds: No murmur heard.     Comments: Weak DP pulses.  Pulmonary:     Effort: Pulmonary effort is normal.     Breath sounds: No rales.  Abdominal:     General: Bowel sounds are normal. There is no distension.     Palpations: Abdomen is soft.     Tenderness: There is no abdominal tenderness. There is no right CVA tenderness, left CVA tenderness, guarding or rebound.  Musculoskeletal:     Cervical back: Normal range of motion and neck supple.     Right lower leg: No edema.     Left lower leg: No edema.  Skin:    General: Skin is warm and dry.     Comments: sacral pressure wound(stage 4) a golf ball sized, depth it to the coccyx bone fascia.  Left heel ruptured blister, resulted in pressure ulcer with center is covered with a yellow slough now.  BLE chronic venous insufficiency skin change  Neurological:     General: No focal deficit present.     Mental Status: She is alert and oriented to person, place, and time. Mental status is at baseline.     Gait: Gait abnormal.  Psychiatric:        Mood and Affect: Mood normal.        Behavior: Behavior normal.        Thought Content: Thought content normal.        Judgment: Judgment normal.     Comments: Appears tired.      Labs reviewed: Recent Labs    01/26/20 0446 01/29/20 0000 03/08/20 0000 04/01/20 0000 05/24/20 1741 05/24/20 1756 05/26/20 0322 05/26/20 0828 06/08/20 0000 08/23/20 0000 09/02/20 0000 09/16/20 0000  NA 130*   < > 137   < > 143 142 142  --    < > 139 137 140  K 3.6   < > 4.0   < > 3.6 3.5 3.2*  --    < > 3.7 3.8 3.0*  CL 101   < > 97*   < > 102 102 102  --    < > 100 100 105  CO2 22   < > 37*   < > 29  --  31  --    < > 32* 28* 26*  GLUCOSE 109*  --   --   --  117* 113* 102*  --   --   --   --   --   BUN 15   < > 24*   < > 26* 29* 20  --    < > 31* 27* 27*  CREATININE 0.66   < > 0.6   < > 0.68 0.60 0.60  --    < > 0.7 0.8 0.6  CALCIUM 7.7*   < > 8.5*   < > 8.8*  --  8.9  --    < > 7.6* 7.8* 7.8*  MG 1.8  --  1.6  --   --   --   --  2.0  --   --   --   --    < > = values in this interval not displayed.   Recent Labs    01/15/20 0840 01/18/20 1302 02/12/20 0000 05/24/20 1741 06/08/20 0000 08/04/20 0000 09/02/20 0000 09/16/20 0000  AST 12 14*   < > 20   < > 11* 16 10*  ALT 7 10   < > 17   < > 6* 17 9  ALKPHOS  --  47   < > 63   < > 54 78 68  BILITOT 0.7 1.0  --  0.7  --   --   --   --   PROT 5.9* 5.8*  --  6.3*  --   --   --   --   ALBUMIN  --  3.0*   < > 2.9*   < > 2.5* 2.2* 2.0*   < > = values in this interval not displayed.   Recent Labs    01/26/20 0446 01/29/20 0000 05/24/20 1741 05/24/20 1756 05/26/20 0823 06/08/20 0000  08/23/20 0000 09/02/20 0000 09/16/20 0000  WBC 10.5   < > 11.7*  --  10.5   < > 8.1 9.2 7.8  NEUTROABS 8.6*   < > 9.5*  --  8.6*   < > 6,091.00 6,744.00 6,162.00  HGB 10.0*   < > 10.3*   < > 10.6*   < > 10.9* 12.1 9.6*  HCT 31.6*   < > 35.1*   < > 35.1*   < > 35* 39 31*  MCV 86.8  --  88.4  --  88.4  --   --   --   --  PLT 255   < > 265  --  279   < > 254 325 294   < > = values in this interval not displayed.   Lab Results  Component Value Date   TSH 2.095 05/25/2020   Lab Results  Component Value Date   HGBA1C 5.4 05/25/2020   Lab Results  Component Value Date   CHOL 133 05/25/2020   HDL 39 (L) 05/25/2020   LDLCALC 79 05/25/2020   TRIG 73 05/25/2020   CHOLHDL 3.4 05/25/2020    Significant Diagnostic Results in last 30 days:  No results found.  Assessment/Plan Unstageable pressure injury of skin and tissue ([place]) Developed from previous ruptured blister, center is covered with yellow slough, will cover the wound with Hydrocolloid dressing q3 days and prn. Float heels while in bed.   Pressure ulcer of coccygeal region, stage 4 (HCC) A golf ball sized pressure ulcer with visible coccyx bone fascia. Continue Silver dressing packing, avoid pressure by repositioning frequently. Sacrococcygeal pressure ulcer, stage 4, declined continuation of  wound care center. Will have prn Morphine for pain control.   Anemia Anemia, desires off  Fe, Vit B12. Vit B12 311, Hgb12.1 09/02/20   C. difficile colitis C-diff colitis, only desires Imodium for symptomatic management, declined ID consultation, Vanco. Wbc 9.2 09/02/20   Atrial fibrillation with rapid ventricular response (HCC) Afib, takes Metoprolol, Diltiazem,desires to be off Eliquis since Ischemic left MCA stroke MRI, in hospital 05/24/20-05/26/20. HR in 100s at rest   Edema of both lower extremities due to peripheral venous insufficiency BLE edema,none, off Torsemide qod. Bun/creat 27/0.8 09/02/20   Weight  loss Mood/weight, desires to be off  [place] 2.095 05/25/20   Insomnia secondary to depression with anxiety Desires prn Lorazepam for comfort measures.   Stroke [facility]) Stroke MRI, in hospital 05/24/20-05/26/20, desires to be  Eliquis. Saw neurology 06/28/20     Family/ staff Communication: plan of care reviewed with the patient and charge nurse.   Labs/tests ordered:  none  Time spend 35 minutes.

## 2020-09-21 NOTE — Assessment & Plan Note (Signed)
C-diff colitis, only desires Imodium for symptomatic management, declined ID consultation, Vanco. Wbc 9.2 09/02/20

## 2020-09-21 NOTE — Assessment & Plan Note (Addendum)
Afib, takes Metoprolol, Diltiazem,desires to be off Eliquis since Ischemic left MCA stroke MRI, in hospital 05/24/20-05/26/20. HR in 100s at rest

## 2020-09-21 NOTE — Assessment & Plan Note (Signed)
Anemia, desires off  Fe, Vit B12. Vit B12 311, Hgb12.1 09/02/20

## 2020-09-21 NOTE — Assessment & Plan Note (Signed)
Desires prn Lorazepam for comfort measures.

## 2020-09-24 LAB — BASIC METABOLIC PANEL
BUN: 25 — AB (ref 4–21)
CO2: 25 — AB (ref 13–22)
Chloride: 103 (ref 99–108)
Creatinine: 0.8 (ref 0.5–1.1)
Glucose: 77
Potassium: 4.6 (ref 3.4–5.3)
Sodium: 137 (ref 137–147)

## 2020-09-24 LAB — COMPREHENSIVE METABOLIC PANEL: Calcium: 7.9 — AB (ref 8.7–10.7)

## 2020-10-04 ENCOUNTER — Non-Acute Institutional Stay (SKILLED_NURSING_FACILITY): Payer: Medicare Other | Admitting: Nurse Practitioner

## 2020-10-04 ENCOUNTER — Encounter: Payer: Self-pay | Admitting: Nurse Practitioner

## 2020-10-04 DIAGNOSIS — D500 Iron deficiency anemia secondary to blood loss (chronic): Secondary | ICD-10-CM

## 2020-10-04 DIAGNOSIS — I872 Venous insufficiency (chronic) (peripheral): Secondary | ICD-10-CM

## 2020-10-04 DIAGNOSIS — R634 Abnormal weight loss: Secondary | ICD-10-CM | POA: Diagnosis not present

## 2020-10-04 DIAGNOSIS — I4891 Unspecified atrial fibrillation: Secondary | ICD-10-CM

## 2020-10-04 DIAGNOSIS — L89154 Pressure ulcer of sacral region, stage 4: Secondary | ICD-10-CM | POA: Diagnosis not present

## 2020-10-04 DIAGNOSIS — R609 Edema, unspecified: Secondary | ICD-10-CM

## 2020-10-04 DIAGNOSIS — R197 Diarrhea, unspecified: Secondary | ICD-10-CM

## 2020-10-04 NOTE — Assessment & Plan Note (Addendum)
desires off  Fe, Vit B12. Vit B12 311, Hgb9.6 09/16/20

## 2020-10-04 NOTE — Assessment & Plan Note (Signed)
Resolved, off diuretics.

## 2020-10-04 NOTE — Assessment & Plan Note (Signed)
Stroke MRI, in hospital 05/24/20-05/26/20, desires to be off  Eliquis. Saw neurology 06/28/20

## 2020-10-04 NOTE — Assessment & Plan Note (Signed)
takes Metoprolol, Diltiazem,desires to be off Eliquis, Hx of Ischemic left MCA stroke MRI, in hospital 05/24/20-05/26/20

## 2020-10-04 NOTE — Assessment & Plan Note (Signed)
C-diff colitis, only desires Imodium for symptomatic management, declined ID consultation, Vanco. Wbc 9.2 09/02/20. On and off diarrhea.

## 2020-10-04 NOTE — Assessment & Plan Note (Signed)
none, off Torsemide qod. Bun/creat 25/0.8 09/24/20

## 2020-10-04 NOTE — Assessment & Plan Note (Signed)
Supportive care, under Hospice service for comfort measures, takes Mirtazapine qod.

## 2020-10-04 NOTE — Assessment & Plan Note (Signed)
Sacrococcygeal pressure ulcer, stage 4, declined continuation of  wound care center, silver dressing packing presently. Morphine is available for pain.

## 2020-10-04 NOTE — Progress Notes (Signed)
Location:    [facility] Room Number: 9 Place of Service:  SNF (31) Provider:  Marlana Latus NP  Shenea Giacobbe X, NP  Patient Care Team: Debie Ashline X, NP as PCP - General (Internal Medicine) [place] Netta Cedars, MD as Consulting Physician (Orthopedic Surgery) Luberta Mutter, MD as Consulting Physician (Ophthalmology) Sydnee Levans, MD as Consulting Physician (Dermatology) Adrian Prows, MD as Consulting Physician (Cardiology)  Extended Emergency Contact Information Primary Emergency Contact: [facility] Address: [address]          [address] [place] [address] [name] of [place] Phone: [phone] Mobile Phone: [phone] Relation: Daughter Secondary Emergency Contact: Mayci, Haning Mobile Phone: [phone] Relation: Son  Code Status:  DNR Hospice Managed Care Goals of care: Advanced Directive information Advanced Directives 08/19/2020  Does Patient Have a Medical Advance Directive? Yes  Type of Paramedic of [place];Living will;Out of facility DNR (pink MOST or yellow form)  Does patient want to make changes to medical advance directive? No - Patient declined  Copy of [place] in Chart? Yes - validated most recent copy scanned in chart (See row information)  Would patient like information on creating a medical advance directive? -  Pre-existing out of facility DNR order (yellow form or pink MOST form) Yellow form placed in chart (order not valid for inpatient use)     Chief Complaint  Patient presents with  . Medical Management of Chronic Issues    Dexa scan, PPSV23, TDAP    HPI:  Pt is a 87 y.o. female seen today for medical management of chronic diseases.     C-diff colitis, only desires Imodium for symptomatic management, declined ID consultation, Vanco. Wbc 9.2 09/02/20 Anemia, desires off  Fe, Vit B12. Vit B12 311, Hgb9.6  09/16/20 Afib, takes Metoprolol, Diltiazem,desires to be off Eliquis, Hx of Ischemic left MCA stroke MRI, in hospital 05/24/20-05/26/20 BLE edema,none, off Torsemide qod. Bun/creat 25/0.8 09/24/20 Mood/weight, desires Mirtazapine qod,TSH 2.095 05/25/20  Sacrococcygeal pressure ulcer, stage 4, declined continuation of  wound care center, silver dressing packing presently.  Stroke MRI, in hospital 05/24/20-05/26/20, desires to be off  Eliquis. Saw neurology 06/28/20   Past Medical History:  Diagnosis Date  . Abnormal uterine bleeding   . Actinic keratosis 02/13/2012  . Acute bronchospasm 10/31/2011  . Cancer ([place]) 04/2008   Skin cancer of low back [name], MD  . Cataract   . Disorder of bone and cartilage, unspecified 02/18/2007  . Dizziness and giddiness 08/30/2010  . Dysmenorrhea   . Edema 12/19/2011  . Endometriosis   . 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 Osteoarthrosis, unspecified whether generalized or localized, unspecified site 08/20/2006  . Osteoporosis   . Other abnormal blood chemistry 03/14/2011  . Other and unspecified hyperlipidemia 10/18/2010  . Palpitations 02/13/2012  . Postmenopausal bleeding 08/30/2010  . Shortness of breath 11/28/2011  . Supraventricular premature beats 05/23/2011  . Unspecified essential hypertension 08/20/2006   Past Surgical History:  Procedure Laterality Date  . CARPAL TUNNEL RELEASE  2004   S. Norris MD  . CATARACT EXTRACTION W/ INTRAOCULAR LENS IMPLANT Right 2010  . CATARACT EXTRACTION W/ INTRAOCULAR LENS IMPLANT Left 10/2012  . DILATION AND CURETTAGE OF UTERUS    . EYE SURGERY Right 07/2009   cataract extraction/IOLI [name], MD  . KNEE ARTHROSCOPY Right 2008   Torn meniscus, Dr. Veverly Fells    Allergies  Allergen Reactions  .  Lomotil [Diphenoxylate] Nausea And Vomiting  . Sulfa Antibiotics Other (See Comments)    Pt unknown   . Amlodipine Swelling    Allergies as of  10/04/2020      Reactions   Lomotil [diphenoxylate] Nausea And Vomiting   Sulfa Antibiotics Other (See Comments)   Pt unknown    Amlodipine Swelling      Medication List       Accurate as of October 04, 2020  3:29 PM. If you have any questions, ask your nurse or doctor.        STOP taking these medications   apixaban 2.5 MG Tabs tablet Commonly known as: ELIQUIS Stopped by: Tima Curet X Justan Gaede, NP   atorvastatin 20 MG tablet Commonly known as: LIPITOR Stopped by: Nafeesa Dils X Tramaine Snell, NP   cholestyramine 4 g packet Commonly known as: QUESTRAN Stopped by: Preeya Cleckley X Jaeden Westbay, NP   Firvanq 25 MG/ML Solr Generic drug: Vancomycin HCl Stopped by: Abeeha Twist X Travonta Gill, NP   multivitamin-lutein Caps capsule Stopped by: Sherryll Skoczylas X Kynzee Devinney, NP   potassium chloride SA 20 MEQ tablet Commonly known as: KLOR-CON Stopped by: Selita Staiger X Estefano Victory, NP   saccharomyces boulardii 250 MG capsule Commonly known as: FLORASTOR Stopped by: Peterson Mathey X Sufyaan Palma, NP   vitamin B-12 1000 MCG tablet Commonly known as: CYANOCOBALAMIN Stopped by: Inita Uram X Alanii Ramer, NP     TAKE these medications   acetaminophen 325 MG tablet Commonly known as: TYLENOL Take 2 tablets (650 mg total) by mouth every 4 (four) hours as needed for mild pain (or temp > 37.5 C (99.5 F)).   Artificial Tears 0.1-0.3 % Soln Generic drug: Dextran 70-Hypromellose Apply 1 drop to eye in the morning, at noon, in the evening, and at bedtime. Both eyes   diltiazem 120 MG 24 hr capsule Commonly known as: CARDIZEM CD Take 1 capsule (120 mg total) by mouth daily.   feeding supplement (PRO-STAT SUGAR FREE 64) Liqd Take 30 mLs by mouth 2 (two) times daily with a meal.   loperamide 2 MG capsule Commonly known as: IMODIUM Take 1 capsule (2 mg total) by mouth every 6 (six) hours as needed for diarrhea or loose stools.   LORazepam 0.5 MG tablet Commonly known as: ATIVAN Take 0.5 mg by mouth every 4 (four) hours as needed.   metoprolol tartrate 50 MG tablet Commonly known as: LOPRESSOR Take  50 mg by mouth 2 (two) times daily.   mirtazapine 7.5 MG tablet Commonly known as: REMERON Take 7.5 mg by mouth every other day.   morphine CONCENTRATE 10 mg / 0.5 ml concentrated solution Take 20 mg by mouth every 4 (four) hours as needed for severe pain. (20 mg/mL); amt: 0.25 mL/5 mg; oral   OXYGEN Inhale 2 L into the lungs as needed (Maintain O2 sat greater than or equal to 90%).   triamcinolone 0.1 % Commonly known as: KENALOG Apply 1 application topically every other day. Once a day. Apply to legs with esch dressing change.   zinc oxide 20 % ointment Apply 1 application topically as needed for irritation.       Review of Systems  Constitutional: Positive for appetite change, fatigue and unexpected weight change. Negative for fever.       No weight change in the past 2 weeks.   HENT: Positive for hearing loss. Negative for congestion and trouble swallowing.   Eyes: Negative for visual disturbance.  Respiratory: Negative for cough and shortness of breath.        Chronic DOE  Cardiovascular: Negative for leg swelling.  Gastrointestinal: Negative for abdominal pain, diarrhea, nausea and vomiting.  Genitourinary: Negative for dysuria and urgency.  Musculoskeletal: Positive for gait problem.  Skin: Positive for wound. Negative for color change.       Left heel, coccygeal pressure wounds.   Neurological: Negative for speech difficulty, weakness and headaches.  Psychiatric/Behavioral: Negative for confusion and sleep disturbance. The patient is not nervous/anxious.     Immunization History  Administered Date(s) Administered  . Fluad Quad(high Dose 65+) 05/26/2020  . Influenza Whole 08/21/2010, 05/21/2012  . Influenza, High Dose Seasonal PF 05/30/2017, 06/04/2019  . Influenza,inj,Quad PF,6+ Mos 05/23/2018  . Influenza-Unspecified 05/20/2015, 06/08/2016, 05/26/2020  . Moderna Sars-Covid-2 Vaccination 08/25/2019, 09/22/2019, 07/05/2020  . Pneumococcal Conjugate-13 08/22/1999   . Td 08/22/1999  . Zoster 08/21/2005   Pertinent  Health Maintenance Due  Topic Date Due  . DEXA SCAN  Never done  . PNA vac Low Risk Adult (2 of 2 - PPSV23) 08/21/2000  . INFLUENZA VACCINE  Completed   Fall Risk  07/23/2019 01/15/2019 07/17/2018 06/04/2018 09/12/2017  Falls in the past year? 0 0 0 No No  Number falls in past yr: 0 0 0 - -  Injury with Fall? 0 0 0 - -   Functional Status Survey:    Vitals:   10/04/20 1324  BP: (!) 100/56  Pulse: 99  Resp: 18  Temp: (!) 96.6 F (35.9 C)  SpO2: 93%  Weight: 88 lb 6.4 oz (40.1 kg)  Height: 5\' 2"  (1.575 m)   Body mass index is 16.17 kg/m. Physical Exam Vitals and nursing note reviewed.  Constitutional:      Appearance: Normal appearance.  HENT:     Head: Normocephalic and atraumatic.     Nose: Nose normal.     Mouth/Throat:     Mouth: Mucous membranes are moist.  Eyes:     Extraocular Movements: Extraocular movements intact.     Conjunctiva/sclera: Conjunctivae normal.     Pupils: Pupils are equal, round, and reactive to light.     Comments: Crusted eyelashes, left eye low vision  Cardiovascular:     Rate and Rhythm: Tachycardia present. Rhythm irregular.     Heart sounds: No murmur heard.     Comments: Weak DP pulses.  Pulmonary:     Effort: Pulmonary effort is normal.     Breath sounds: No rales.  Abdominal:     General: Bowel sounds are normal. There is no distension.     Palpations: Abdomen is soft.     Tenderness: There is no abdominal tenderness. There is no right CVA tenderness, left CVA tenderness, guarding or rebound.  Musculoskeletal:     Cervical back: Normal range of motion and neck supple.     Right lower leg: No edema.     Left lower leg: No edema.  Skin:    General: Skin is warm and dry.     Comments: sacral pressure wound(stage 4) a golf ball sized, depth it to the coccyx bone fascia. Left heel ruptured blister, resulted in pressure ulcer with center is covered with a yellow slough now.  BLE  chronic venous insufficiency skin change  Neurological:     General: No focal deficit present.     Mental Status: She is alert and oriented to person, place, and time. Mental status is at baseline.     Gait: Gait abnormal.  Psychiatric:        Mood and Affect: Mood normal.  Behavior: Behavior normal.        Thought Content: Thought content normal.        Judgment: Judgment normal.     Comments: Appears tired.      Labs reviewed: Recent Labs    01/26/20 0446 01/29/20 0000 03/08/20 0000 04/01/20 0000 05/24/20 1741 05/24/20 1756 05/26/20 0322 05/26/20 0828 06/08/20 0000 09/02/20 0000 09/16/20 0000 09/24/20 0000  NA 130*   < > 137   < > 143 142 142  --    < > 137 140 137  K 3.6   < > 4.0   < > 3.6 3.5 3.2*  --    < > 3.8 3.0* 4.6  CL 101   < > 97*   < > 102 102 102  --    < > 100 105 103  CO2 22   < > 37*   < > 29  --  31  --    < > 28* 26* 25*  GLUCOSE 109*  --   --   --  117* 113* 102*  --   --   --   --   --   BUN 15   < > 24*   < > 26* 29* 20  --    < > 27* 27* 25*  CREATININE 0.66   < > 0.6   < > 0.68 0.60 0.60  --    < > 0.8 0.6 0.8  CALCIUM 7.7*   < > 8.5*   < > 8.8*  --  8.9  --    < > 7.8* 7.8* 7.9*  MG 1.8  --  1.6  --   --   --   --  2.0  --   --   --   --    < > = values in this interval not displayed.   Recent Labs    01/15/20 0840 01/18/20 1302 02/12/20 0000 05/24/20 1741 06/08/20 0000 08/04/20 0000 09/02/20 0000 09/16/20 0000  AST 12 14*   < > 20   < > 11* 16 10*  ALT 7 10   < > 17   < > 6* 17 9  ALKPHOS  --  47   < > 63   < > 54 78 68  BILITOT 0.7 1.0  --  0.7  --   --   --   --   PROT 5.9* 5.8*  --  6.3*  --   --   --   --   ALBUMIN  --  3.0*   < > 2.9*   < > 2.5* 2.2* 2.0*   < > = values in this interval not displayed.   Recent Labs    01/26/20 0446 01/29/20 0000 05/24/20 1741 05/24/20 1756 05/26/20 0823 06/08/20 0000 08/23/20 0000 09/02/20 0000 09/16/20 0000  WBC 10.5   < > 11.7*  --  10.5   < > 8.1 9.2 7.8  NEUTROABS 8.6*   <  > 9.5*  --  8.6*   < > 6,091.00 6,744.00 6,162.00  HGB 10.0*   < > 10.3*   < > 10.6*   < > 10.9* 12.1 9.6*  HCT 31.6*   < > 35.1*   < > 35.1*   < > 35* 39 31*  MCV 86.8  --  88.4  --  88.4  --   --   --   --   PLT 255   < > 265  --  279   < > 254 325 294   < > = values in this interval not displayed.   Lab Results  Component Value Date   TSH 2.095 05/25/2020   Lab Results  Component Value Date   HGBA1C 5.4 05/25/2020   Lab Results  Component Value Date   CHOL 133 05/25/2020   HDL 39 (L) 05/25/2020   LDLCALC 79 05/25/2020   TRIG 73 05/25/2020   CHOLHDL 3.4 05/25/2020    Significant Diagnostic Results in last 30 days:  No results found.  Assessment/Plan Atrial fibrillation with rapid ventricular response (HCC) takes Metoprolol, Diltiazem,desires to be off Eliquis, Hx of Ischemic left MCA stroke MRI, in hospital 05/24/20-05/26/20   Edema of both lower extremities due to peripheral venous insufficiency Resolved, off diuretics.  Weight loss Supportive care, under Hospice service for comfort measures, takes Mirtazapine qod.   Pressure ulcer of coccygeal region, stage 4 (HCC)  Sacrococcygeal pressure ulcer, stage 4, declined continuation of  wound care center, silver dressing packing presently. Morphine is available for pain.   H/O ischemic left MCA stroke Stroke MRI, in hospital 05/24/20-05/26/20, desires to be off  Eliquis. Saw neurology 06/28/20   Anemia desires off  Fe, Vit B12. Vit B12 311, Hgb9.6 09/16/20   Diarrhea C-diff colitis, only desires Imodium for symptomatic management, declined ID consultation, Vanco. Wbc 9.2 09/02/20. On and off diarrhea.    Edema none, off Torsemide qod. Bun/creat 25/0.8 09/24/20     Family/ staff Communication: plan of care reviewed with the patient and charge nurse.   Labs/tests ordered:  none  Time spend 25 minutes.

## 2020-10-19 DEATH — deceased

## 2020-11-03 ENCOUNTER — Ambulatory Visit: Payer: Medicare Other | Admitting: Adult Health

## 2020-11-16 ENCOUNTER — Other Ambulatory Visit: Payer: Self-pay | Admitting: Nurse Practitioner

## 2020-12-02 ENCOUNTER — Other Ambulatory Visit: Payer: Self-pay | Admitting: Nurse Practitioner

## 2020-12-06 NOTE — Telephone Encounter (Signed)
Manixe we received a refill request on this patient and the last visit indicates she is skilled and it is my understanding that refills for routine medications are to be filled through the facility staff.  Please advise

## 2021-10-08 IMAGING — DX DG ABD PORTABLE 1V
1 series · 1 of 1 positions shown · non-contrast
Comparison: None.

CLINICAL DATA: Diarrhea.

EXAM:
PORTABLE ABDOMEN - 1 VIEW

[abdomen kub]
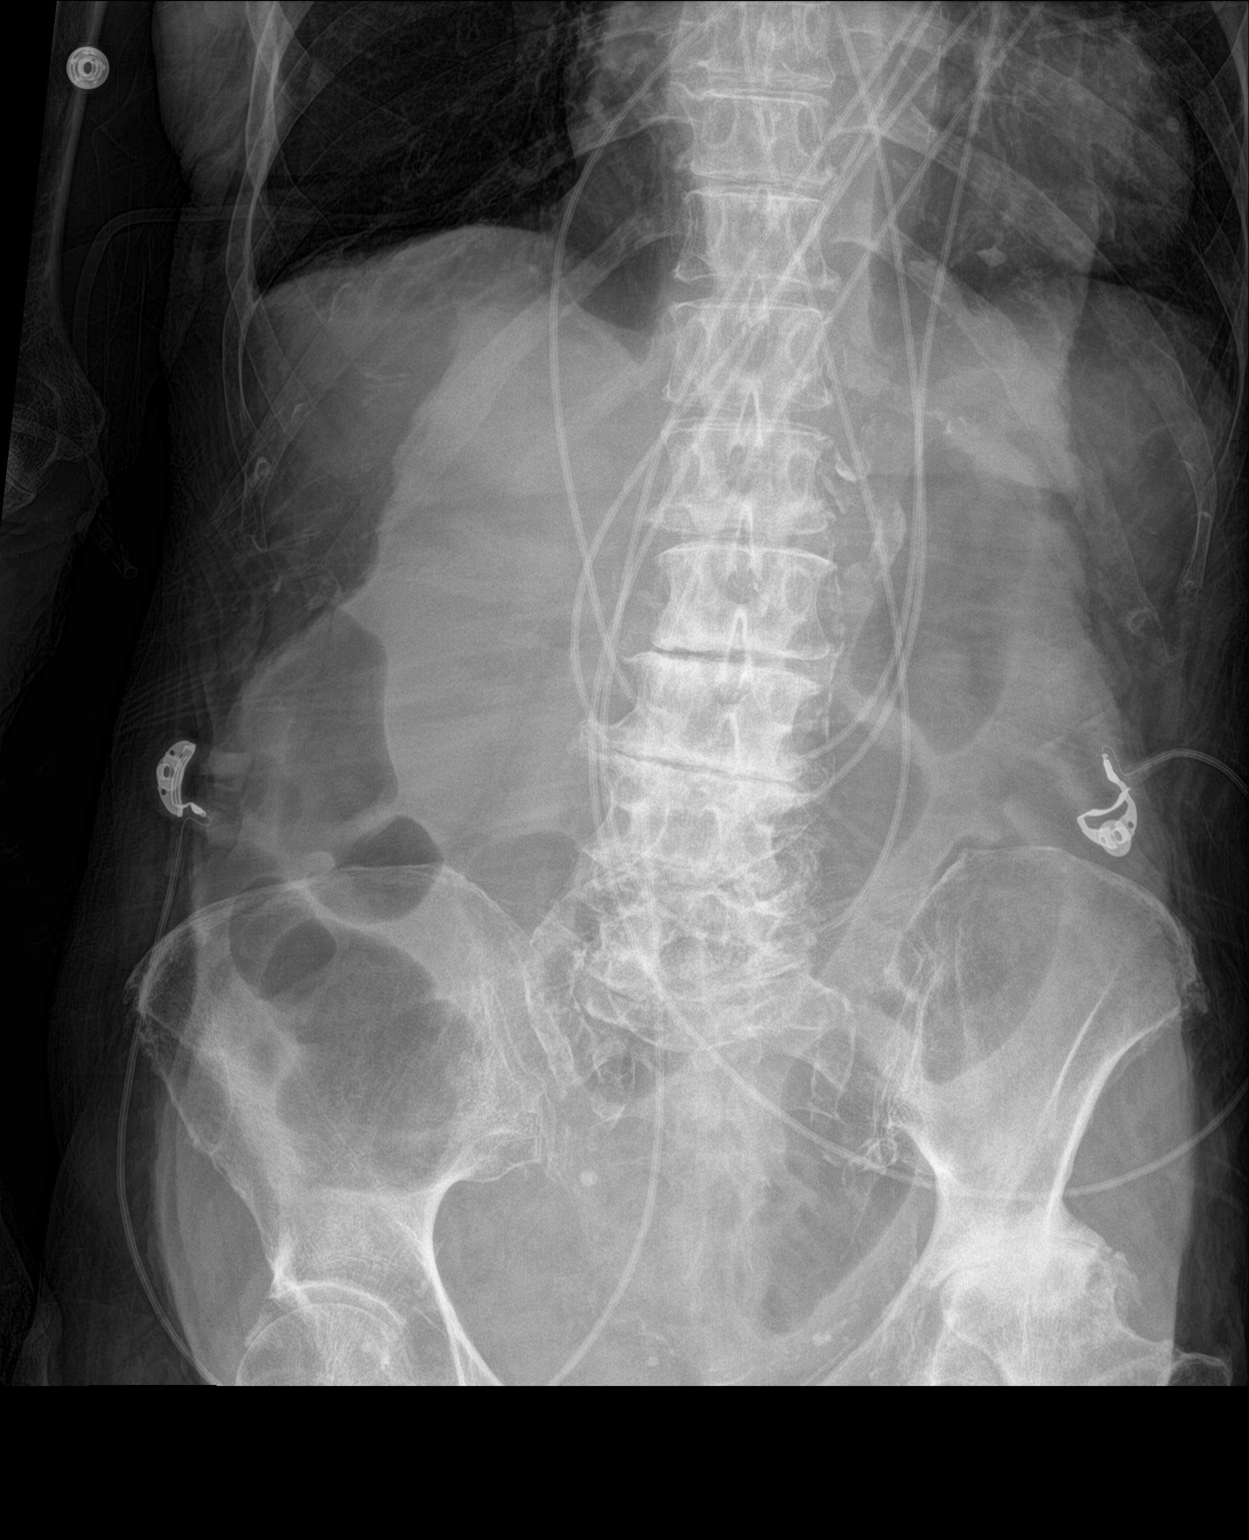

[1 of 1 positions shown; findings below may reference images not displayed]

FINDINGS: The bowel gas pattern is normal. No radio-opaque calculi or other
significant radiographic abnormality are seen. Extensive vascular
calcification noted. Advanced lumbar spine degenerative changes and
chronic avascular necrosis of left hip noted.
IMPRESSION: Unremarkable bowel gas pattern. No acute findings.

## 2021-10-09 IMAGING — CT CT ABD-PELV W/ CM
2 of 5 series · 16 of 46 positions shown, 18 images · IV contrast (APPLIED)
Comparison: Abdominal radiograph dated 01/19/2020

CLINICAL DATA: [AGE] female with diarrhea.

EXAM:
CT ABDOMEN AND PELVIS WITH CONTRAST
TECHNIQUE: Multidetector CT imaging of the abdomen and pelvis was performed
using the standard protocol following bolus administration of
intravenous contrast.
CONTRAST:  100mL OMNIPAQUE IOHEXOL 300 MG/ML  SOLN

[Series 2: axial st · axial · 0.70mm/px · z∈[-982,-626]mm · 13 of 83 slices shown, 15 images]
[im 6/83  soft-tissue]
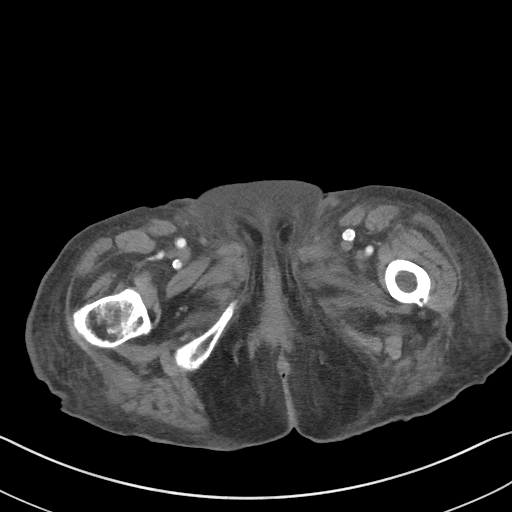
[im 6/83  bone]
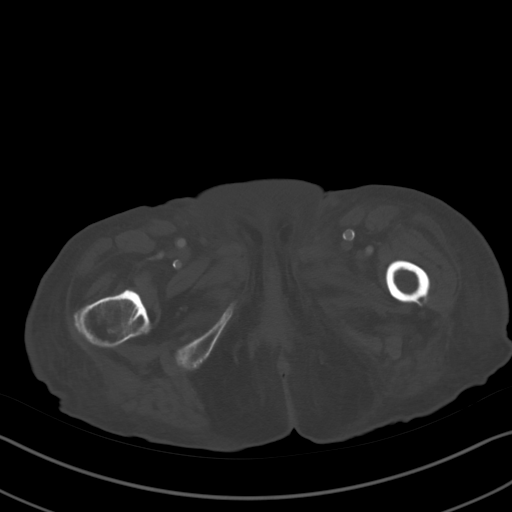
[im 11/83  soft-tissue]
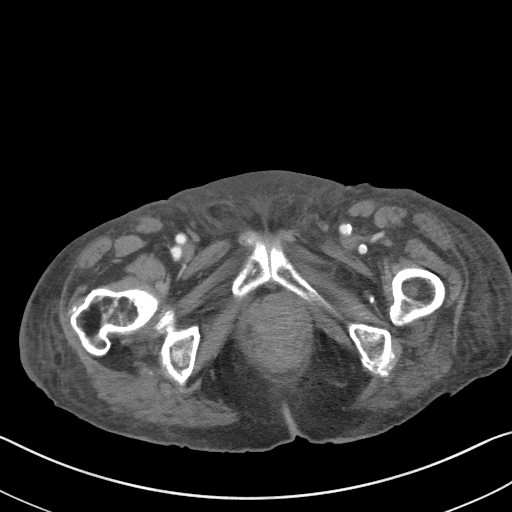
[im 16/83  soft-tissue]
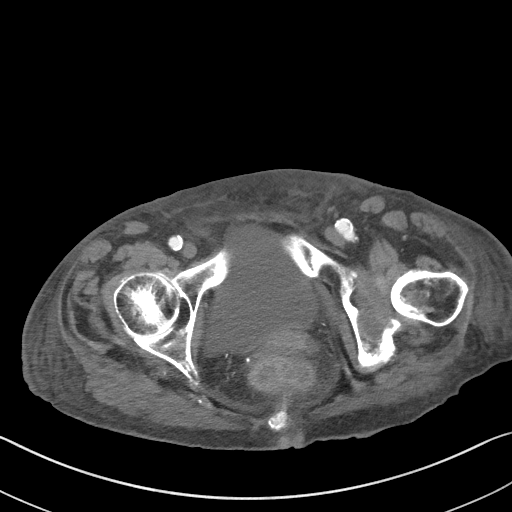
[im 26/83  soft-tissue]
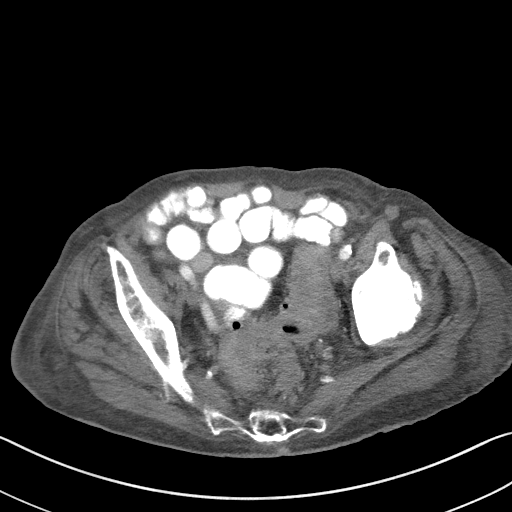
[im 31/83  soft-tissue]
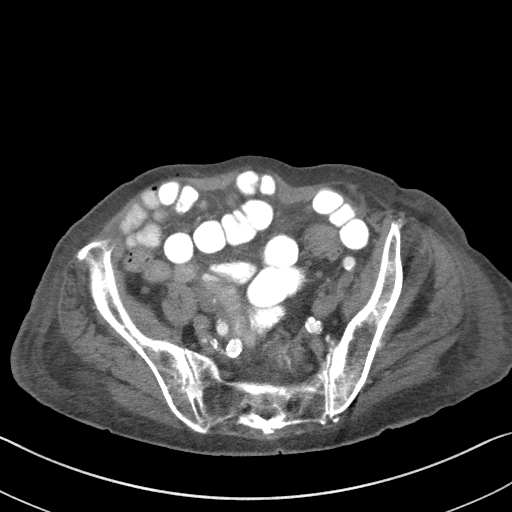
[im 36/83  soft-tissue]
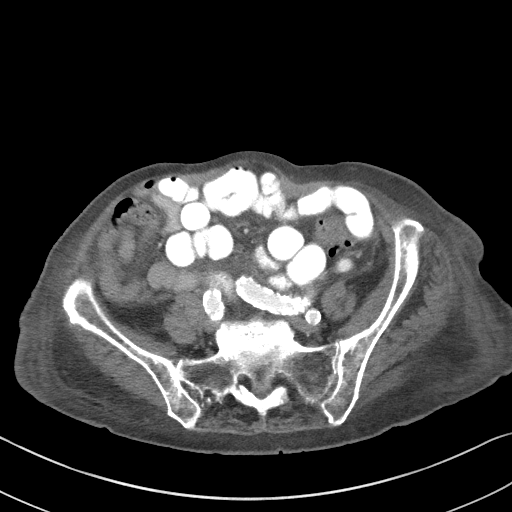
[im 42/83  soft-tissue]
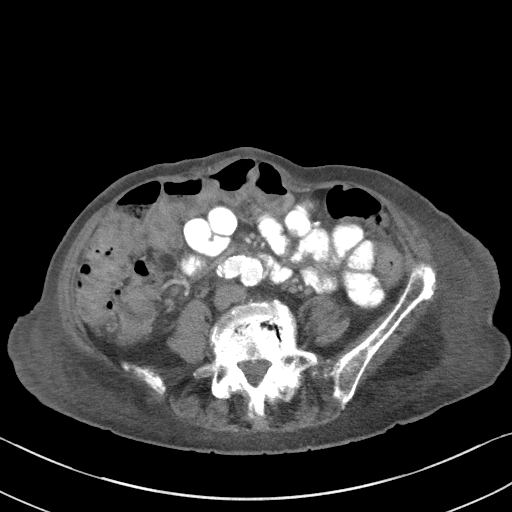
[im 47/83  soft-tissue]
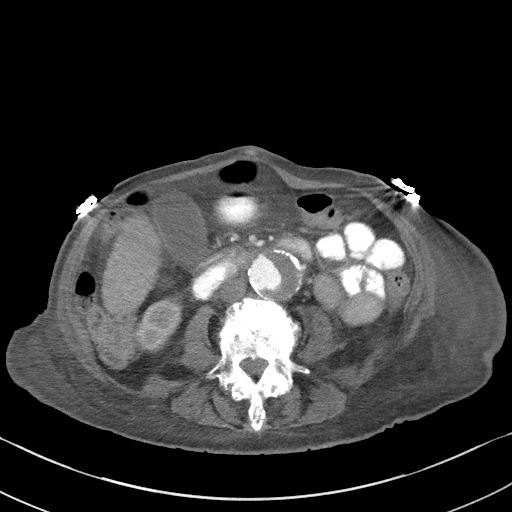
[im 52/83  soft-tissue]
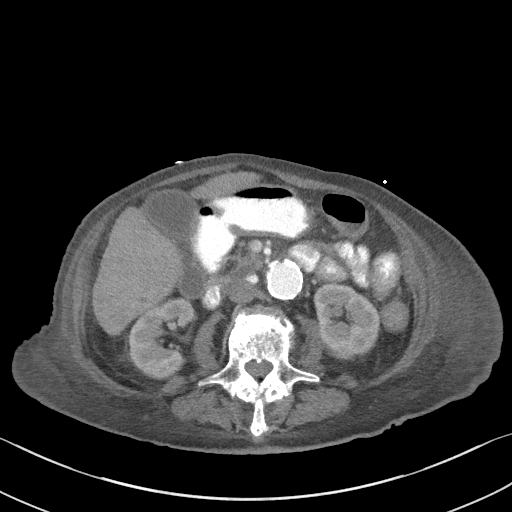
[im 52/83  bone]
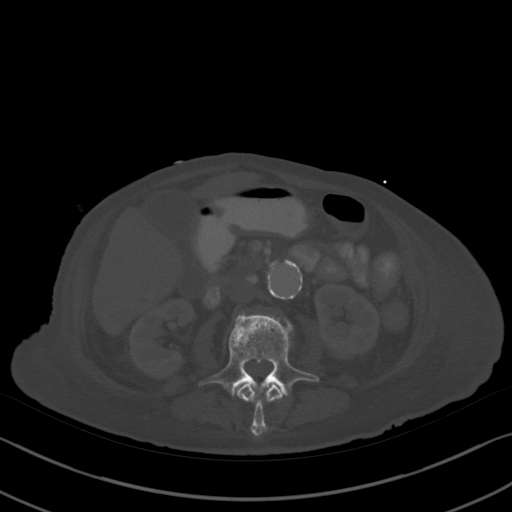
[im 57/83  soft-tissue]
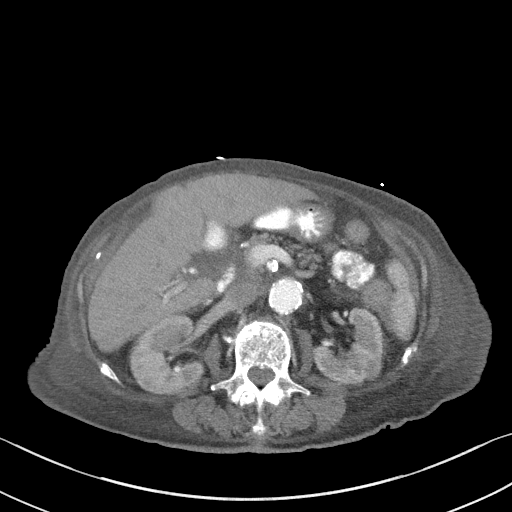
[im 67/83  soft-tissue]
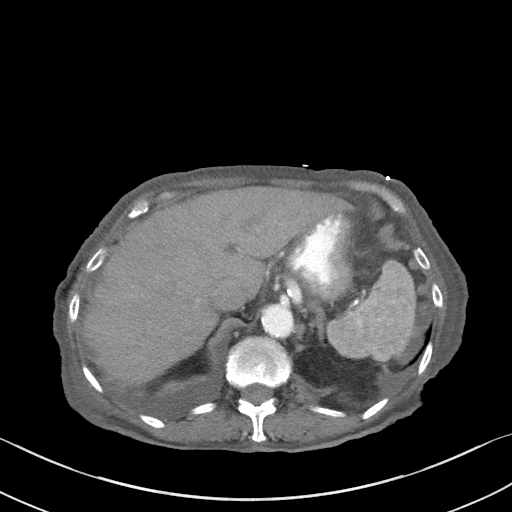
[im 72/83  soft-tissue]
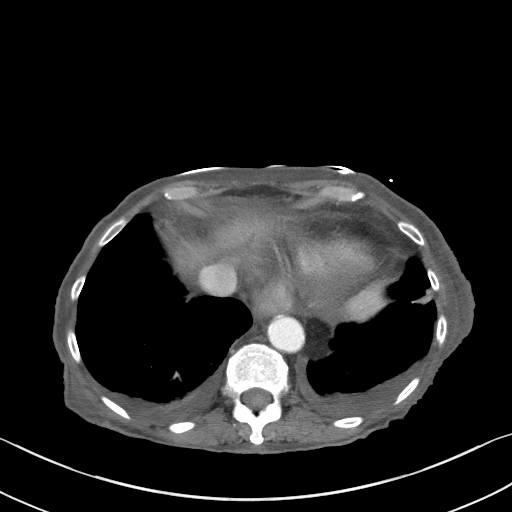
[im 77/83  soft-tissue]
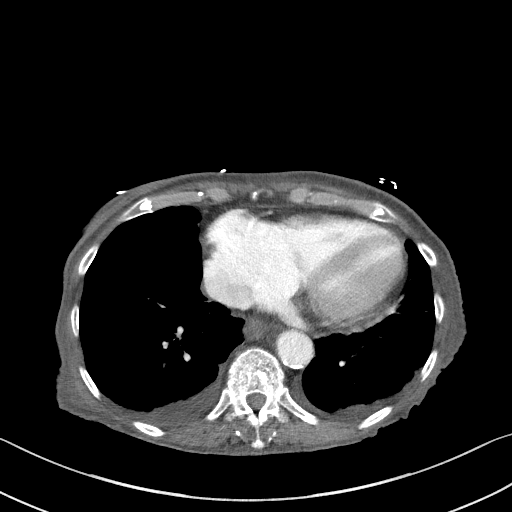

[Series 4: coronal st · coronal · 0.70mm/px · 3 of 72 slices shown]
[im 24/72  soft-tissue]
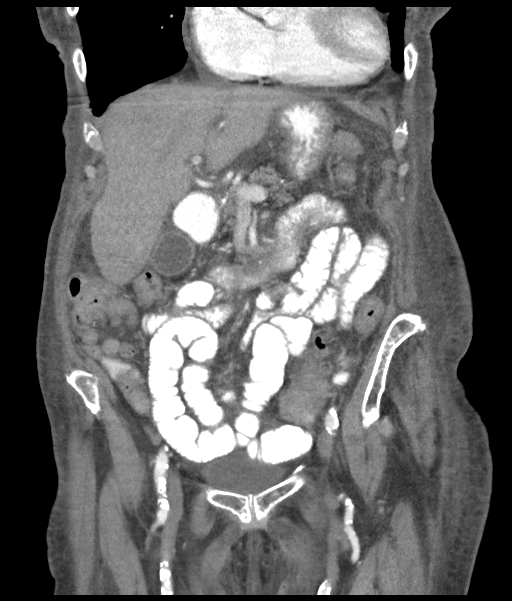
[im 32/72  soft-tissue]
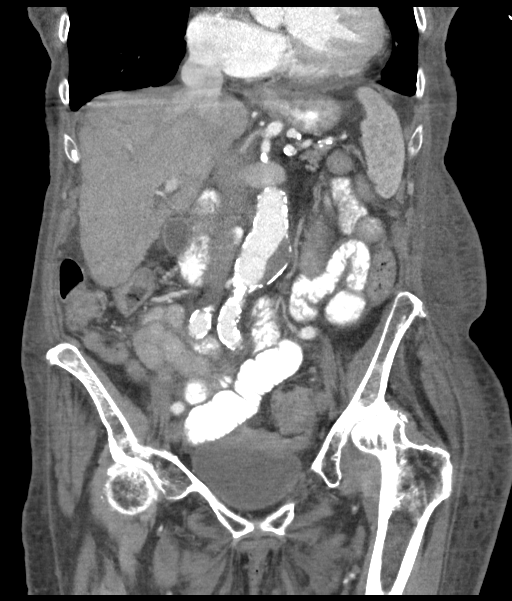
[im 40/72  soft-tissue]
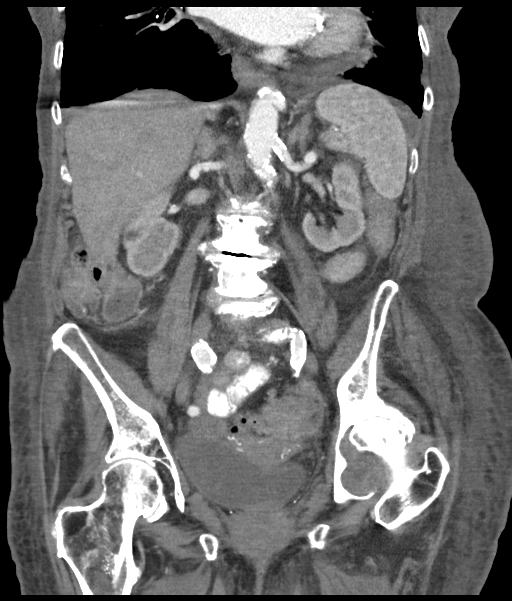

[16 of 46 positions shown; findings below may reference images not displayed]

FINDINGS: Lower chest: Small bilateral pleural effusions with associated
partial compressive atelectasis of the lower lobes. Pneumonia is not
excluded. Clinical correlation is recommended. There is mild
cardiomegaly. Multi vessel coronary vascular calcification.

No intra-abdominal free air. Diffuse mesenteric edema and small
perihepatic ascites.

Hepatobiliary: Minimal irregularity of the liver contour may
represent early changes of cirrhosis. Clinical correlation is
recommended. No intrahepatic biliary ductal dilatation. The
gallbladder is unremarkable.

Pancreas: Unremarkable. No pancreatic ductal dilatation or
surrounding inflammatory changes.

Spleen: Normal in size without focal abnormality.

Adrenals/Urinary Tract: The adrenal glands are unremarkable. There
is no hydronephrosis on either side. There is symmetric enhancement
and excretion of contrast by both kidneys. There is a 2 cm left
renal interpolar cyst and several subcentimeter hypodensities which
are too small to characterize. The visualized ureters and urinary
bladder appear unremarkable.

Stomach/Bowel: There is scattered sigmoid diverticula. Diffuse
thickening of the distal colon primarily involving the rectosigmoid
most consistent with colitis. Clinical correlation is recommended.
There is no bowel obstruction. The appendix is normal.

Vascular/Lymphatic: Advanced aortoiliac atherosclerotic disease.
There is a 3.5 cm partially thrombosed infrarenal abdominal aortic
aneurysm. The IVC is unremarkable. No portal venous gas. There is no
adenopathy.

Reproductive: The uterus is grossly unremarkable.

Other: Diffuse subcutaneous edema and anasarca.

Musculoskeletal: Osteopenia with degenerative changes of the spine.
No acute osseous pathology. Severe arthritic changes of the left
hip.
IMPRESSION: 1. Findings most likely represent colitis and less likely sigmoid
diverticulitis. Clinical correlation is recommended. No bowel
obstruction. Normal appendix.
2. Small bilateral pleural effusions, small ascites and anasarca.
3. Probable early cirrhosis.
4. A 3.5 cm partially thrombosed infrarenal abdominal aortic
aneurysm.
5. Aortic Atherosclerosis ([id number].E).
